# Patient Record
Sex: Female | Born: 1950 | Race: White | Hispanic: No | Marital: Married | Smoking: Never smoker
Health system: Southern US, Community
[De-identification: ages and names within clinical notes are randomized; demographics above are authoritative.]

## PROBLEM LIST (undated history)

## (undated) DIAGNOSIS — I2510 Atherosclerotic heart disease of native coronary artery without angina pectoris: Secondary | ICD-10-CM

## (undated) DIAGNOSIS — K635 Polyp of colon: Secondary | ICD-10-CM

## (undated) DIAGNOSIS — I10 Essential (primary) hypertension: Secondary | ICD-10-CM

## (undated) DIAGNOSIS — I4891 Unspecified atrial fibrillation: Secondary | ICD-10-CM

## (undated) DIAGNOSIS — Z8619 Personal history of other infectious and parasitic diseases: Secondary | ICD-10-CM

## (undated) DIAGNOSIS — C801 Malignant (primary) neoplasm, unspecified: Secondary | ICD-10-CM

## (undated) DIAGNOSIS — G4700 Insomnia, unspecified: Secondary | ICD-10-CM

## (undated) DIAGNOSIS — R011 Cardiac murmur, unspecified: Secondary | ICD-10-CM

## (undated) DIAGNOSIS — I340 Nonrheumatic mitral (valve) insufficiency: Secondary | ICD-10-CM

## (undated) DIAGNOSIS — K219 Gastro-esophageal reflux disease without esophagitis: Secondary | ICD-10-CM

## (undated) DIAGNOSIS — E785 Hyperlipidemia, unspecified: Secondary | ICD-10-CM

## (undated) DIAGNOSIS — I519 Heart disease, unspecified: Secondary | ICD-10-CM

## (undated) HISTORY — DX: Gastro-esophageal reflux disease without esophagitis: K21.9

## (undated) HISTORY — DX: Heart disease, unspecified: I51.9

## (undated) HISTORY — DX: Essential (primary) hypertension: I10

## (undated) HISTORY — DX: Unspecified atrial fibrillation: I48.91

## (undated) HISTORY — PX: HYSTEROSCOPY WITH D & C: SHX1775

## (undated) HISTORY — DX: Polyp of colon: K63.5

## (undated) HISTORY — DX: Insomnia, unspecified: G47.00

## (undated) HISTORY — PX: BREAST SURGERY: SHX581

## (undated) HISTORY — DX: Hyperlipidemia, unspecified: E78.5

## (undated) HISTORY — DX: Nonrheumatic mitral (valve) insufficiency: I34.0

## (undated) HISTORY — PX: APPENDECTOMY: SHX54

## (undated) HISTORY — DX: Personal history of other infectious and parasitic diseases: Z86.19

## (undated) HISTORY — PX: BREAST EXCISIONAL BIOPSY: SUR124

---

## 2003-12-11 LAB — HM COLONOSCOPY

## 2004-09-29 ENCOUNTER — Ambulatory Visit: Payer: Self-pay | Admitting: Unknown Physician Specialty

## 2005-10-26 ENCOUNTER — Ambulatory Visit: Payer: Self-pay | Admitting: Unknown Physician Specialty

## 2006-10-29 ENCOUNTER — Ambulatory Visit: Payer: Self-pay | Admitting: Unknown Physician Specialty

## 2008-01-03 ENCOUNTER — Ambulatory Visit: Payer: Self-pay | Admitting: Unknown Physician Specialty

## 2008-02-19 ENCOUNTER — Other Ambulatory Visit: Payer: Self-pay

## 2008-02-19 ENCOUNTER — Ambulatory Visit: Payer: Self-pay | Admitting: Unknown Physician Specialty

## 2008-03-03 ENCOUNTER — Ambulatory Visit: Payer: Self-pay | Admitting: Unknown Physician Specialty

## 2008-12-24 LAB — HM PAP SMEAR
HM PAP: NEGATIVE
HM Pap smear: NEGATIVE

## 2009-01-05 ENCOUNTER — Ambulatory Visit: Payer: Self-pay | Admitting: Unknown Physician Specialty

## 2009-01-07 ENCOUNTER — Ambulatory Visit: Payer: Self-pay | Admitting: Unknown Physician Specialty

## 2009-01-13 ENCOUNTER — Ambulatory Visit: Payer: Self-pay | Admitting: Unknown Physician Specialty

## 2009-03-09 ENCOUNTER — Ambulatory Visit: Payer: Self-pay | Admitting: Family Medicine

## 2010-03-11 ENCOUNTER — Ambulatory Visit: Payer: Self-pay | Admitting: Unknown Physician Specialty

## 2011-03-14 ENCOUNTER — Ambulatory Visit: Payer: Self-pay | Admitting: Unknown Physician Specialty

## 2011-03-16 ENCOUNTER — Ambulatory Visit: Payer: Self-pay | Admitting: Unknown Physician Specialty

## 2012-04-03 ENCOUNTER — Ambulatory Visit: Payer: Self-pay

## 2012-04-03 LAB — HM DEXA SCAN: HM DEXA SCAN: NORMAL

## 2012-09-13 ENCOUNTER — Ambulatory Visit: Payer: Self-pay | Admitting: Family Medicine

## 2012-09-20 ENCOUNTER — Ambulatory Visit: Payer: Self-pay | Admitting: Family Medicine

## 2012-10-04 ENCOUNTER — Emergency Department: Payer: Self-pay | Admitting: Emergency Medicine

## 2012-10-04 LAB — CBC
HCT: 37.6 % (ref 35.0–47.0)
MCV: 91 fL (ref 80–100)
RBC: 4.12 10*6/uL (ref 3.80–5.20)
RDW: 13.2 % (ref 11.5–14.5)
WBC: 5.1 10*3/uL (ref 3.6–11.0)

## 2012-10-04 LAB — TROPONIN I: Troponin-I: <0.02 ng/mL

## 2012-10-04 LAB — COMPREHENSIVE METABOLIC PANEL
Albumin: 3.7 g/dL (ref 3.4–5.0)
Alkaline Phosphatase: 43 U/L — ABNORMAL LOW (ref 50–136)
Anion Gap: 5 — ABNORMAL LOW (ref 7–16)
BUN: 12 mg/dL (ref 7–18)
Bilirubin,Total: 0.2 mg/dL (ref 0.2–1.0)
Calcium, Total: 9.1 mg/dL (ref 8.5–10.1)
EGFR (African American): >60
SGOT(AST): 19 U/L (ref 15–37)
SGPT (ALT): 32 U/L (ref 12–78)
Sodium: 141 mmol/L (ref 136–145)
Total Protein: 6.5 g/dL (ref 6.4–8.2)

## 2012-12-31 DIAGNOSIS — D3500 Benign neoplasm of unspecified adrenal gland: Secondary | ICD-10-CM | POA: Insufficient documentation

## 2013-02-19 DIAGNOSIS — C44320 Squamous cell carcinoma of skin of unspecified parts of face: Secondary | ICD-10-CM | POA: Insufficient documentation

## 2013-03-21 LAB — HM MAMMOGRAPHY

## 2014-01-12 LAB — HM MAMMOGRAPHY: HM Mammogram: NEGATIVE

## 2014-03-11 LAB — HM PAP SMEAR

## 2014-04-15 LAB — HM MAMMOGRAPHY

## 2014-05-01 HISTORY — PX: CORONARY ARTERY BYPASS GRAFT: SHX141

## 2014-05-29 LAB — BASIC METABOLIC PANEL
BUN: 13 mg/dL (ref 4–21)
Creatinine: 1 mg/dL (ref 0.5–1.1)
GLUCOSE: 95 mg/dL
POTASSIUM: 4.2 mmol/L (ref 3.4–5.3)
Sodium: 144 mmol/L (ref 137–147)

## 2014-05-29 LAB — LIPID PANEL
CHOLESTEROL: 110 mg/dL (ref 0–200)
HDL: 31 mg/dL — AB (ref 35–70)
LDL Cholesterol: 59 mg/dL
LDl/HDL Ratio: 1.9
Triglycerides: 99 mg/dL (ref 40–160)

## 2014-05-29 LAB — HEPATIC FUNCTION PANEL
ALK PHOS: 45 U/L (ref 25–125)
ALT: 35 U/L (ref 7–35)
AST: 22 U/L (ref 13–35)
Bilirubin, Total: 0.4 mg/dL

## 2014-06-01 DIAGNOSIS — I341 Nonrheumatic mitral (valve) prolapse: Secondary | ICD-10-CM | POA: Insufficient documentation

## 2014-06-01 DIAGNOSIS — I25810 Atherosclerosis of coronary artery bypass graft(s) without angina pectoris: Secondary | ICD-10-CM | POA: Insufficient documentation

## 2014-06-29 DIAGNOSIS — Z8679 Personal history of other diseases of the circulatory system: Secondary | ICD-10-CM | POA: Insufficient documentation

## 2014-07-13 ENCOUNTER — Encounter: Payer: Self-pay | Admitting: Internal Medicine

## 2014-08-11 ENCOUNTER — Encounter: Payer: Self-pay | Admitting: Internal Medicine

## 2014-11-26 LAB — CBC AND DIFFERENTIAL
HCT: 40 % (ref 36–46)
Hemoglobin: 13.4 g/dL (ref 12.0–16.0)
Neutrophils Absolute: 2 /uL
PLATELETS: 197 10*3/uL (ref 150–399)
WBC: 4.9 10^3/mL

## 2014-11-26 LAB — BASIC METABOLIC PANEL
BUN: 15 mg/dL (ref 4–21)
CREATININE: 0.9 mg/dL (ref 0.5–1.1)
GLUCOSE: 89 mg/dL
POTASSIUM: 3.7 mmol/L (ref 3.4–5.3)
SODIUM: 143 mmol/L (ref 137–147)

## 2014-11-26 LAB — HEPATIC FUNCTION PANEL
ALT: 24 U/L (ref 7–35)
AST: 20 U/L (ref 13–35)
Alkaline Phosphatase: 46 U/L (ref 25–125)
BILIRUBIN, TOTAL: 0.5 mg/dL

## 2014-11-26 LAB — LIPID PANEL
Cholesterol: 106 mg/dL (ref 0–200)
HDL: 35 mg/dL (ref 35–70)
LDL Cholesterol: 51 mg/dL
LDl/HDL Ratio: 1.5
Triglycerides: 100 mg/dL (ref 40–160)

## 2014-12-11 DIAGNOSIS — K2100 Gastro-esophageal reflux disease with esophagitis, without bleeding: Secondary | ICD-10-CM | POA: Insufficient documentation

## 2015-03-07 ENCOUNTER — Other Ambulatory Visit: Payer: Self-pay | Admitting: Family Medicine

## 2015-03-07 DIAGNOSIS — G4700 Insomnia, unspecified: Secondary | ICD-10-CM

## 2015-03-08 DIAGNOSIS — G4700 Insomnia, unspecified: Secondary | ICD-10-CM | POA: Insufficient documentation

## 2015-03-11 ENCOUNTER — Encounter: Payer: Self-pay | Admitting: Internal Medicine

## 2015-03-11 ENCOUNTER — Ambulatory Visit (INDEPENDENT_AMBULATORY_CARE_PROVIDER_SITE_OTHER): Payer: BLUE CROSS/BLUE SHIELD | Admitting: Internal Medicine

## 2015-03-11 ENCOUNTER — Encounter (INDEPENDENT_AMBULATORY_CARE_PROVIDER_SITE_OTHER): Payer: Self-pay

## 2015-03-11 VITALS — BP 130/70 | HR 64 | Temp 98.2°F | Ht 69.5 in | Wt 181.5 lb

## 2015-03-11 DIAGNOSIS — Z0000 Encounter for general adult medical examination without abnormal findings: Secondary | ICD-10-CM

## 2015-03-11 DIAGNOSIS — I10 Essential (primary) hypertension: Secondary | ICD-10-CM

## 2015-03-11 DIAGNOSIS — E7800 Pure hypercholesterolemia, unspecified: Secondary | ICD-10-CM

## 2015-03-11 DIAGNOSIS — G4700 Insomnia, unspecified: Secondary | ICD-10-CM

## 2015-03-11 DIAGNOSIS — I2510 Atherosclerotic heart disease of native coronary artery without angina pectoris: Secondary | ICD-10-CM | POA: Diagnosis not present

## 2015-03-11 MED ORDER — TRAZODONE HCL 50 MG PO TABS: ORAL_TABLET | ORAL | Status: DC

## 2015-03-11 NOTE — Progress Notes (Signed)
Pre visit review using our clinic review tool, if applicable. No additional management support is needed unless otherwise documented below in the visit note. 

## 2015-03-11 NOTE — Progress Notes (Signed)
[name], female   DOB: 10/18/1950, 64 y.o.   MRN: [id number]   Subjective:    [name], female    DOB: Jul 26, 1951, 64 y.o.   MRN: [id number]  HPI  Patient here to establish care.  She has a history of heart disease and is s/p triple bypass - 04/2014.  She went to cardiac rehab.  Since her bypass, she has adjusted her diet and is exercising.  Has lost weight.  Has lost 40 pounds.  Exercises at the [facility] 3 days per week.  Has decreased her salt intake.  Sees Dr Nehemiah Massed every six months.  No sob.  States her blood pressure is normally well controlled.  Has had colonoscopy, but has been over 10 years ago.  Bowels stable.  Has trouble sleeping.  This has been an issue since her bypass.  Has been on trazodone.  She has been trying to decrease the dose.  Needs something to help her sleep.     Past Medical History  Diagnosis Date  . History of chicken pox   . Hypertension   . Hyperlipidemia   . Heart disease     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)     Outpatient Encounter Prescriptions as of 03/11/2015  Medication Sig  . amLODipine (NORVASC) 10 MG tablet Take 10 mg by mouth daily.  Marland Kitchen aspirin EC 81 MG tablet Take 81 mg by mouth daily.  Marland Kitchen atorvastatin (LIPITOR) 20 MG tablet Take 20 mg by mouth daily.  . Cholecalciferol (D 1000) 1000 UNITS capsule Take 1,000 Units by mouth daily.  . clopidogrel (PLAVIX) 75 MG tablet Take 75 mg by mouth daily.  . metoprolol tartrate (LOPRESSOR) 25 MG tablet Take 25 mg by mouth 2 (two) times daily.  . Omega-3 Fatty Acids (FISH OIL) 1000 MG CAPS Take 1,200 mg by mouth daily.  . pantoprazole (PROTONIX) 40 MG tablet Take 40 mg by mouth daily.  Marland Kitchen telmisartan-hydrochlorothiazide (MICARDIS HCT) 80-25 MG per tablet Take by mouth daily.  . traZODone (DESYREL) 50 MG tablet Take 1-2 tablets q hs prn  . [DISCONTINUED] traZODone (DESYREL) 50 MG tablet TAKE 1 TABLET BY MOUTH EVERY NIGHT AT BEDTIME   No facility-administered encounter medications on  file as of 03/11/2015.    Review of Systems  Constitutional: Negative for appetite change and unexpected weight change.  HENT: Negative for congestion and sinus pressure.   Respiratory: Negative for cough, chest tightness and shortness of breath.   Cardiovascular: Negative for chest pain, palpitations and leg swelling.  Gastrointestinal: Negative for nausea, vomiting, abdominal pain and diarrhea.  Genitourinary: Negative for dysuria and difficulty urinating.  Musculoskeletal: Negative for back pain and joint swelling.  Skin: Negative for color change and rash.  Neurological: Negative for dizziness, light-headedness and headaches.  Hematological: Negative for adenopathy. Does not bruise/bleed easily.  Psychiatric/Behavioral: Negative for dysphoric mood and agitation.       Objective:     Blood pressure recheck:  148-150/84  Physical Exam  Constitutional: She appears well-developed and well-nourished. No distress.  HENT:  Nose: Nose normal.  Mouth/Throat: Oropharynx is clear and moist.  Neck: Neck supple. No thyromegaly present.  Cardiovascular: Normal rate and regular rhythm.   Pulmonary/Chest: Breath sounds normal. No respiratory distress. She has no wheezes.  Abdominal: Soft. Bowel sounds are normal. There is no tenderness.  Musculoskeletal: She exhibits no edema or tenderness.  Lymphadenopathy:    She has no cervical adenopathy.  Skin: No rash noted. No erythema.  Psychiatric: She has a normal mood and affect. Her behavior is normal.    BP 130/70 mmHg  Pulse 64  Temp(Src) 98.2 F (36.8 C) (Oral)  Ht 5' 9.5" (1.765 m)  Wt 181 lb 8 oz (82.328 kg)  BMI 26.43 kg/m2  SpO2 98% Wt Readings from Last 3 Encounters:  03/11/15 181 lb 8 oz (82.328 kg)     Lab Results  Component Value Date   WBC 4.9 11/26/2014   HGB 13.4 11/26/2014   HCT 40 11/26/2014   PLT 197 11/26/2014   GLUCOSE 91 10/04/2012   CHOL 106 11/26/2014   TRIG 100 11/26/2014   HDL 35 11/26/2014   LDLCALC  51 11/26/2014   ALT 24 11/26/2014   AST 20 11/26/2014   NA 143 11/26/2014   K 3.7 11/26/2014   CL 110* 10/04/2012   CREATININE 0.9 11/26/2014   BUN 15 11/26/2014   CO2 26 10/04/2012       Assessment & Plan:   Problem List Items Addressed This Visit    CAD (coronary artery disease)    Is s/p triple bypass.  Is exercising.  Has adjusted her diet.  Has lost weight.  No cardiac symptoms with increased activity or exertion.  Continue risk factor modification.  Continue f/u with cardiology.        Relevant Medications   aspirin EC 81 MG tablet   atorvastatin (LIPITOR) 20 MG tablet   telmisartan-hydrochlorothiazide (MICARDIS HCT) 80-25 MG per tablet   metoprolol tartrate (LOPRESSOR) 25 MG tablet   amLODipine (NORVASC) 10 MG tablet   Essential hypertension    Blood pressure as outlined.  Elevated today.  Have her spot check her pressure.  Get her back in soon to reassess.  Check metabolic panel.       Relevant Medications   aspirin EC 81 MG tablet   atorvastatin (LIPITOR) 20 MG tablet   telmisartan-hydrochlorothiazide (MICARDIS HCT) 80-25 MG per tablet   metoprolol tartrate (LOPRESSOR) 25 MG tablet   amLODipine (NORVASC) 10 MG tablet   Other Relevant Orders   TSH   Basic metabolic panel   Health care maintenance    Colonoscopy was more than 10 years ago.  Due now.  Will plan for referral to GI.   Make sure blood pressure under better control prior to scope.        Hypercholesterolemia    On lipitor.  Follow lipid panel and liver function tests.  Low cholesterol diet and exercise.        Relevant Medications   aspirin EC 81 MG tablet   atorvastatin (LIPITOR) 20 MG tablet   telmisartan-hydrochlorothiazide (MICARDIS HCT) 80-25 MG per tablet   metoprolol tartrate (LOPRESSOR) 25 MG tablet   amLODipine (NORVASC) 10 MG tablet   Other Relevant Orders   Lipid panel   Hepatic function panel   Insomnia - Primary    Discussed at length with her today.  On trazodone.  Will increase  the dose to 75mg  q hs.  Follow closely.  Increase to 100mg  if needed.        Relevant Medications   traZODone (DESYREL) 50 MG tablet     I spent 45 minutes with the patient and more than 50% of the time was spent in consultation regarding the above.     Einar Pheasant, MD

## 2015-03-14 ENCOUNTER — Encounter: Payer: Self-pay | Admitting: Internal Medicine

## 2015-03-14 DIAGNOSIS — I10 Essential (primary) hypertension: Secondary | ICD-10-CM | POA: Insufficient documentation

## 2015-03-14 DIAGNOSIS — Z0000 Encounter for general adult medical examination without abnormal findings: Secondary | ICD-10-CM | POA: Insufficient documentation

## 2015-03-14 DIAGNOSIS — I2510 Atherosclerotic heart disease of native coronary artery without angina pectoris: Secondary | ICD-10-CM | POA: Insufficient documentation

## 2015-03-14 DIAGNOSIS — E7800 Pure hypercholesterolemia, unspecified: Secondary | ICD-10-CM | POA: Insufficient documentation

## 2015-03-14 NOTE — Assessment & Plan Note (Signed)
Is s/p triple bypass.  Is exercising.  Has adjusted her diet.  Has lost weight.  No cardiac symptoms with increased activity or exertion.  Continue risk factor modification.  Continue f/u with cardiology.

## 2015-03-14 NOTE — Assessment & Plan Note (Signed)
On lipitor.  Follow lipid panel and liver function tests.  Low cholesterol diet and exercise   

## 2015-03-14 NOTE — Assessment & Plan Note (Signed)
Blood pressure as outlined.  Elevated today.  Have her spot check her pressure.  Get her back in soon to reassess.  Check metabolic panel.

## 2015-03-14 NOTE — Assessment & Plan Note (Signed)
Colonoscopy was more than 10 years ago.  Due now.  Will plan for referral to GI.   Make sure blood pressure under better control prior to scope.

## 2015-03-14 NOTE — Assessment & Plan Note (Signed)
Discussed at length with her today.  On trazodone.  Will increase the dose to 75mg  q hs.  Follow closely.  Increase to 100mg  if needed.

## 2015-03-23 ENCOUNTER — Encounter: Payer: Self-pay | Admitting: Internal Medicine

## 2015-03-24 ENCOUNTER — Other Ambulatory Visit: Payer: BLUE CROSS/BLUE SHIELD

## 2015-03-26 ENCOUNTER — Other Ambulatory Visit (INDEPENDENT_AMBULATORY_CARE_PROVIDER_SITE_OTHER): Payer: BLUE CROSS/BLUE SHIELD

## 2015-03-26 DIAGNOSIS — E7800 Pure hypercholesterolemia, unspecified: Secondary | ICD-10-CM

## 2015-03-26 DIAGNOSIS — I10 Essential (primary) hypertension: Secondary | ICD-10-CM

## 2015-03-26 LAB — HEPATIC FUNCTION PANEL
ALBUMIN: 4.1 g/dL (ref 3.5–5.2)
ALT: 19 U/L (ref 0–35)
AST: 19 U/L (ref 0–37)
Alkaline Phosphatase: 45 U/L (ref 39–117)
Bilirubin, Direct: 0.1 mg/dL (ref 0.0–0.3)
Total Bilirubin: 0.6 mg/dL (ref 0.2–1.2)
Total Protein: 6.7 g/dL (ref 6.0–8.3)

## 2015-03-26 LAB — LIPID PANEL
CHOLESTEROL: 110 mg/dL (ref 0–200)
HDL: 36 mg/dL — AB (ref >39.00)
LDL Cholesterol: 56 mg/dL (ref 0–99)
NonHDL: 74
Total CHOL/HDL Ratio: 3
Triglycerides: 90 mg/dL (ref 0.0–149.0)
VLDL: 18 mg/dL (ref 0.0–40.0)

## 2015-03-26 LAB — BASIC METABOLIC PANEL
BUN: 22 mg/dL (ref 6–23)
CO2: 30 mEq/L (ref 19–32)
Calcium: 10.1 mg/dL (ref 8.4–10.5)
Chloride: 104 mEq/L (ref 96–112)
Creatinine, Ser: 0.95 mg/dL (ref 0.40–1.20)
GFR: 62.98 mL/min (ref >60.00)
Glucose, Bld: 80 mg/dL (ref 70–99)
Potassium: 3.8 mEq/L (ref 3.5–5.1)
Sodium: 140 mEq/L (ref 135–145)

## 2015-03-26 LAB — TSH: TSH: 2.46 u[IU]/mL (ref 0.35–4.50)

## 2015-03-29 ENCOUNTER — Encounter: Payer: Self-pay | Admitting: *Deleted

## 2015-04-02 ENCOUNTER — Encounter: Payer: Self-pay | Admitting: Internal Medicine

## 2015-05-04 ENCOUNTER — Encounter: Payer: Self-pay | Admitting: Obstetrics and Gynecology

## 2015-05-05 ENCOUNTER — Ambulatory Visit (INDEPENDENT_AMBULATORY_CARE_PROVIDER_SITE_OTHER): Payer: BLUE CROSS/BLUE SHIELD | Admitting: Obstetrics and Gynecology

## 2015-05-05 ENCOUNTER — Encounter: Payer: Self-pay | Admitting: Obstetrics and Gynecology

## 2015-05-05 VITALS — BP 153/76 | HR 66 | Ht 70.0 in | Wt 180.8 lb

## 2015-05-05 DIAGNOSIS — E669 Obesity, unspecified: Secondary | ICD-10-CM | POA: Insufficient documentation

## 2015-05-05 DIAGNOSIS — Z1239 Encounter for other screening for malignant neoplasm of breast: Secondary | ICD-10-CM

## 2015-05-05 DIAGNOSIS — Z01419 Encounter for gynecological examination (general) (routine) without abnormal findings: Secondary | ICD-10-CM | POA: Diagnosis not present

## 2015-05-05 DIAGNOSIS — F4320 Adjustment disorder, unspecified: Secondary | ICD-10-CM | POA: Insufficient documentation

## 2015-05-05 DIAGNOSIS — N949 Unspecified condition associated with female genital organs and menstrual cycle: Secondary | ICD-10-CM

## 2015-05-05 DIAGNOSIS — Z1211 Encounter for screening for malignant neoplasm of colon: Secondary | ICD-10-CM

## 2015-05-05 DIAGNOSIS — Z8619 Personal history of other infectious and parasitic diseases: Secondary | ICD-10-CM | POA: Insufficient documentation

## 2015-05-05 DIAGNOSIS — E785 Hyperlipidemia, unspecified: Secondary | ICD-10-CM | POA: Insufficient documentation

## 2015-05-05 DIAGNOSIS — I059 Rheumatic mitral valve disease, unspecified: Secondary | ICD-10-CM | POA: Insufficient documentation

## 2015-05-05 DIAGNOSIS — N9489 Other specified conditions associated with female genital organs and menstrual cycle: Secondary | ICD-10-CM

## 2015-05-05 DIAGNOSIS — M1990 Unspecified osteoarthritis, unspecified site: Secondary | ICD-10-CM | POA: Insufficient documentation

## 2015-05-05 DIAGNOSIS — Z951 Presence of aortocoronary bypass graft: Secondary | ICD-10-CM | POA: Insufficient documentation

## 2015-05-05 DIAGNOSIS — F419 Anxiety disorder, unspecified: Secondary | ICD-10-CM | POA: Insufficient documentation

## 2015-05-05 NOTE — Patient Instructions (Addendum)
1.  No Pap. 2.  Mammogram ordered. 3.  Stool guaiac card testing. 4.  Pelvic ultrasound ordered to assess enlarged uterus versus left ovary enlargement 5.  Continue with calcium and vitamin D daily 6.  Continue with her regular exercise daily. 7.  Return in 1 year

## 2015-05-05 NOTE — Progress Notes (Signed)
[name], female   DOB: 1950/09/19, 64 y.o.   MRN: [id number] ANNUAL PREVENTATIVE CARE GYN  ENCOUNTER NOTE  Subjective:       Alexandra Mendoza is a 64 y.o. No obstetric history on file. female here for a routine annual gynecologic exam.  Current complaints: 1.  none    Gynecologic History No LMP recorded. Patient is postmenopausal. Contraception: post menopausal status Last Pap: 2013. Results were: normal Last mammogram: 2015. Results were: normal  Obstetric History Para 1001  Past Medical History  Diagnosis Date  . History of chicken pox   . Hypertension   . Hyperlipidemia   . 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 Insomnia   . Acid reflux     Past Surgical History  Procedure Laterality Date  . Appendectomy    . Triple bypass    . Hysteroscopy w/d&c    . Breast surgery      Biopsy  . Lipoma removed      removed from forehead    Current Outpatient Prescriptions on File Prior to Visit  Medication Sig Dispense Refill  . amLODipine (NORVASC) 10 MG tablet Take 10 mg by mouth daily.  4  . aspirin EC 81 MG tablet Take 81 mg by mouth daily.    Marland Kitchen atorvastatin (LIPITOR) 20 MG tablet Take 20 mg by mouth daily.    . Cholecalciferol (D 1000) 1000 UNITS capsule Take 1,000 Units by mouth daily.    . metoprolol tartrate (LOPRESSOR) 25 MG tablet Take 25 mg by mouth 2 (two) times daily.  5  . Omega-3 Fatty Acids (FISH OIL) 1000 MG CAPS Take 1,200 mg by mouth daily.    . pantoprazole (PROTONIX) 40 MG tablet Take 40 mg by mouth daily.  9  . telmisartan-hydrochlorothiazide (MICARDIS HCT) 80-25 MG per tablet Take by mouth daily.     No current facility-administered medications on file prior to visit.    No Known Allergies  Social History   Social History  . Marital Status: Married    Spouse Name: N/A  . Number of Children: N/A  . Years of Education: N/A   Occupational History  . Not on file.   Social History Main Topics  . Smoking status: Never  Smoker   . Smokeless tobacco: Never Used  . Alcohol Use: 0.0 oz/week    0 Standard drinks or equivalent per week     Comment: socially  . Drug Use: No  . Sexual Activity: Yes   Other Topics Concern  . Not on file   Social History Narrative    Family History  Problem Relation Age of Onset  . Breast cancer      maternal great aunt  . Colon cancer Neg Hx   . Diabetes Neg Hx   . Ovarian cancer Neg Hx   . Heart disease      multiple family members  . Heart disease Mother   . Heart disease Father     The following portions of the patient's history were reviewed and updated as appropriate: allergies, current medications, past family history, past medical history, past social history, past surgical history and problem list.  Review of Systems ROS Review of Systems - General ROS: negative for - chills, fatigue, fever, hot flashes, night sweats, weight gain or weight loss Psychological ROS: negative for - anxiety, decreased libido, depression, mood swings, physical abuse or sexual abuse Ophthalmic ROS: negative for - blurry vision, eye pain  or loss of vision ENT ROS: negative for - headaches, hearing change, visual changes or vocal changes Allergy and Immunology ROS: negative for - hives, itchy/watery eyes or seasonal allergies Hematological and Lymphatic ROS: negative for - bleeding problems, bruising, swollen lymph nodes or weight loss Endocrine ROS: negative for - galactorrhea, hair pattern changes, hot flashes, malaise/lethargy, mood swings, palpitations, polydipsia/polyuria, skin changes, temperature intolerance or unexpected weight changes Breast ROS: negative for - new or changing breast lumps or nipple discharge Respiratory ROS: negative for - cough or shortness of breath Cardiovascular ROS: negative for - chest pain, irregular heartbeat, palpitations or shortness of breath Gastrointestinal ROS: no abdominal pain, change in bowel habits, or black or bloody  stools Genito-Urinary ROS: no dysuria, trouble voiding, or hematuria Musculoskeletal ROS: negative for - joint pain or joint stiffness Neurological ROS: negative for - bowel and bladder control changes Dermatological ROS: negative for rash and skin lesion changes   Objective:   BP 153/76 mmHg  Pulse 66  Ht 5\' 10"  (1.778 m)  Wt 180 lb 12.8 oz (82.01 kg)  BMI 25.94 kg/m2 CONSTITUTIONAL: Well-developed, well-nourished female in no acute distress.  PSYCHIATRIC: Normal mood and affect. Normal behavior. Normal judgment and thought content. [place]: Alert and oriented to person, place, and time. Normal muscle tone coordination. No cranial nerve deficit noted. HENT:  Normocephalic, atraumatic, External right and left ear normal. Oropharynx is clear and moist EYES: Conjunctivae and EOM are normal. Pupils are equal, round, and reactive to light. No scleral icterus.  NECK: Normal range of motion, supple, no masses.  Normal thyroid.  SKIN: Skin is warm and dry. No rash noted. Not diaphoretic. No erythema. No pallor. CARDIOVASCULAR: Normal heart rate noted, regular rhythm, no murmur. RESPIRATORY: Clear to auscultation bilaterally. Effort and breath sounds normal, no problems with respiration noted. BREASTS: Symmetric in size. No masses, skin changes, nipple drainage, or lymphadenopathy. ABDOMEN: Soft, normal bowel sounds, no distention noted.  No tenderness, rebound or guarding.  BLADDER: Normal PELVIC:  External Genitalia: Normal  BUS: Normal  Vagina: Normal  Cervix: Normal  Uterus: irregular with slight deviation to Lt (?fibroid vs ovary); mobile, nontender  Adnexa: Normal  RV: External Exam NormaI, No Rectal Masses and Normal Sphincter tone  MUSCULOSKELETAL: Normal range of motion. No tenderness.  No cyanosis, clubbing, or edema.  2+ distal pulses. LYMPHATIC: No Axillary, Supraclavicular, or Inguinal Adenopathy.    Assessment:   Annual gynecologic examination 64 y.o. Contraception:  status post hysterectomy Normal BMI LLQ mass (fibroid vs ovary) ASVD/CAD, S/P Triple bypass, asymptomatic  Plan:  Pap: Pap Co Test Mammogram: Ordered Stool Guaiac Testing:  Ordered Labs: thru pcp Routine preventative health maintenance measures emphasized: Exercise/Diet/Weight control, Tobacco Warnings and Alcohol/Substance use risks U/S - pelvis Return to Clinic - [place], CMA  Brayton Mars, MD

## 2015-05-08 LAB — PAP IG AND HPV HIGH-RISK
HPV, high-risk: NEGATIVE
PAP Smear Comment: 0

## 2015-05-11 ENCOUNTER — Ambulatory Visit: Payer: BLUE CROSS/BLUE SHIELD

## 2015-05-11 DIAGNOSIS — N9489 Other specified conditions associated with female genital organs and menstrual cycle: Secondary | ICD-10-CM

## 2015-05-11 DIAGNOSIS — N949 Unspecified condition associated with female genital organs and menstrual cycle: Secondary | ICD-10-CM | POA: Diagnosis not present

## 2015-05-12 ENCOUNTER — Encounter: Payer: Self-pay | Admitting: Internal Medicine

## 2015-05-12 ENCOUNTER — Ambulatory Visit
Admission: RE | Admit: 2015-05-12 | Discharge: 2015-05-12 | Disposition: A | Discharge status: Home / Self Care | Payer: BLUE CROSS/BLUE SHIELD | Source: Ambulatory Visit | Attending: Obstetrics and Gynecology | Admitting: Obstetrics and Gynecology

## 2015-05-12 ENCOUNTER — Ambulatory Visit (INDEPENDENT_AMBULATORY_CARE_PROVIDER_SITE_OTHER): Payer: BLUE CROSS/BLUE SHIELD | Admitting: Internal Medicine

## 2015-05-12 VITALS — BP 130/60 | HR 58 | Temp 98.2°F | Ht 70.0 in | Wt 178.2 lb

## 2015-05-12 DIAGNOSIS — E7800 Pure hypercholesterolemia, unspecified: Secondary | ICD-10-CM

## 2015-05-12 DIAGNOSIS — K2100 Gastro-esophageal reflux disease with esophagitis, without bleeding: Secondary | ICD-10-CM

## 2015-05-12 DIAGNOSIS — I2510 Atherosclerotic heart disease of native coronary artery without angina pectoris: Secondary | ICD-10-CM | POA: Diagnosis not present

## 2015-05-12 DIAGNOSIS — Z1239 Encounter for other screening for malignant neoplasm of breast: Secondary | ICD-10-CM

## 2015-05-12 DIAGNOSIS — Z1231 Encounter for screening mammogram for malignant neoplasm of breast: Secondary | ICD-10-CM | POA: Insufficient documentation

## 2015-05-12 DIAGNOSIS — I10 Essential (primary) hypertension: Secondary | ICD-10-CM

## 2015-05-12 DIAGNOSIS — R87629 Unspecified abnormal cytological findings in specimens from vagina: Secondary | ICD-10-CM

## 2015-05-12 DIAGNOSIS — G4700 Insomnia, unspecified: Secondary | ICD-10-CM

## 2015-05-12 DIAGNOSIS — R896 Abnormal cytological findings in specimens from other organs, systems and tissues: Secondary | ICD-10-CM

## 2015-05-12 NOTE — Progress Notes (Signed)
[name], female   DOB: 13-Mar-1951, 64 y.o.   MRN: [id number]   Subjective:    [name], female    DOB: [birth year], 64 y.o.   MRN: [id number]  HPI  Patient here for a scheduled follow up.  Saw Dr Enzo Bi.  Had pelvic ultrasound.  Fibroid.  PAP with ASCUS - negative HPV.  Recommended f/u pap in one year.  He gave her hemoccult cards.  Trazodone is working for her.  Helping her sleep.  Sees Dr Nehemiah Massed for her CAD.  Stable.  Doing well.  No chest pain or tightness.  No sob.  Eating and drinking well.  No nausea or vomiting.  Bowels stable.  Sister was diagnosed with breast cancer last week. Discussed this and genetic testing.  Mother - no history of breast cancer.     Past Medical History  Diagnosis Date  . History of chicken pox   . Hypertension   . Hyperlipidemia   . 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 Insomnia   . Acid reflux    Past Surgical History  Procedure Laterality Date  . Appendectomy    . Triple bypass    . Hysteroscopy w/d&c    . Breast surgery      Biopsy  . Lipoma removed      removed from forehead  . Breast excisional biopsy Right     negative over 5 years ago   Family History  Problem Relation Age of Onset  . Breast cancer      maternal great aunt  . Heart disease      multiple family members  . Colon cancer Neg Hx   . Diabetes Neg Hx   . Ovarian cancer Neg Hx   . Heart disease Mother   . Heart disease Father   . Breast cancer Sister 71   Social History   Social History  . Marital Status: Married    Spouse Name: N/A  . Number of Children: N/A  . Years of Education: N/A   Social History Main Topics  . Smoking status: Never Smoker   . Smokeless tobacco: Never Used  . Alcohol Use: 0.0 oz/week    0 Standard drinks or equivalent per week     Comment: socially  . Drug Use: No  . Sexual Activity: Yes   Other Topics Concern  . None   Social History Narrative    Outpatient Encounter  Prescriptions as of 05/12/2015  Medication Sig  . amLODipine (NORVASC) 10 MG tablet Take 10 mg by mouth daily.  Marland Kitchen aspirin EC 81 MG tablet Take 81 mg by mouth daily.  Marland Kitchen atorvastatin (LIPITOR) 20 MG tablet Take 20 mg by mouth daily.  Marland Kitchen BIOTIN PO Take by mouth daily.  . Calcium-Magnesium-Vitamin D (CALCIUM 1200+D3 PO) Take by mouth.  . metoprolol tartrate (LOPRESSOR) 25 MG tablet Take 25 mg by mouth 2 (two) times daily.  . Omega-3 Fatty Acids (FISH OIL) 1000 MG CAPS Take 1,200 mg by mouth daily.  . pantoprazole (PROTONIX) 40 MG tablet Take 40 mg by mouth daily.  Marland Kitchen telmisartan-hydrochlorothiazide (MICARDIS HCT) 80-25 MG per tablet Take by mouth daily.  . traZODone (DESYREL) 50 MG tablet Take 1.5 tablets by mouth.  . [DISCONTINUED] Cholecalciferol (D 1000) 1000 UNITS capsule Take 1,000 Units by mouth daily.   No facility-administered encounter medications on file as of 05/12/2015.    Review of Systems  Constitutional: Negative for appetite change  and unexpected weight change.  HENT: Negative for congestion and sinus pressure.   Respiratory: Negative for cough, chest tightness and shortness of breath.   Cardiovascular: Negative for chest pain, palpitations and leg swelling.  Gastrointestinal: Negative for nausea, vomiting, abdominal pain and diarrhea.  Genitourinary: Negative for dysuria and difficulty urinating.  Musculoskeletal: Negative for back pain and joint swelling.  Skin: Negative for color change and rash.  Neurological: Negative for dizziness, light-headedness and headaches.  Hematological: Negative for adenopathy. Does not bruise/bleed easily.  Psychiatric/Behavioral: Negative for dysphoric mood and agitation.       Objective:    Physical Exam  Constitutional: She appears well-developed and well-nourished. No distress.  HENT:  Nose: Nose normal.  Mouth/Throat: Oropharynx is clear and moist.  Eyes: Conjunctivae are normal. Right eye exhibits no discharge. Left eye exhibits no  discharge.  Neck: Neck supple. No thyromegaly present.  Cardiovascular: Normal rate and regular rhythm.   Pulmonary/Chest: Breath sounds normal. No respiratory distress. She has no wheezes.  Abdominal: Soft. Bowel sounds are normal. There is no tenderness.  Musculoskeletal: She exhibits no edema or tenderness.  Lymphadenopathy:    She has no cervical adenopathy.  Skin: No rash noted. No erythema.  Psychiatric: She has a normal mood and affect. Her behavior is normal.    BP 130/60 mmHg  Pulse 58  Temp(Src) 98.2 F (36.8 C) (Oral)  Ht 5\' 10"  (1.778 m)  Wt 178 lb 4 oz (80.854 kg)  BMI 25.58 kg/m2  SpO2 98% Wt Readings from Last 3 Encounters:  05/12/15 178 lb 4 oz (80.854 kg)  05/05/15 180 lb 12.8 oz (82.01 kg)  03/11/15 181 lb 8 oz (82.328 kg)     Lab Results  Component Value Date   WBC 4.9 11/26/2014   HGB 13.4 11/26/2014   HCT 40 11/26/2014   PLT 197 11/26/2014   GLUCOSE 80 03/26/2015   CHOL 110 03/26/2015   TRIG 90.0 03/26/2015   HDL 36.00* 03/26/2015   LDLCALC 56 03/26/2015   ALT 19 03/26/2015   AST 19 03/26/2015   NA 140 03/26/2015   K 3.8 03/26/2015   CL 104 03/26/2015   CREATININE 0.95 03/26/2015   BUN 22 03/26/2015   CO2 30 03/26/2015   TSH 2.46 03/26/2015       Assessment & Plan:   Problem List Items Addressed This Visit    Abnormal vaginal Pap smear    ASCUS.  Negative HPV.  Seeing Dr Enzo Bi.  Planning for f/u pap in one year.        CAD (coronary artery disease) - Primary    Is s/p triple bypass.  Is exercising.  Has lost weight.  No cardiac symptoms with increased activity or exertion.  Continue risk factor modification.       Esophagitis, reflux    On protonix.        Essential hypertension    Blood pressure under good control.  Continue same medication regimen.  Follow pressures.  Follow metabolic panel.        Relevant Orders   Basic metabolic panel   Hypercholesterolemia    Low cholesterol diet and exercise.  Follow lipid panel  and liver function tests.  On lipitor.        Relevant Orders   Lipid panel   Hepatic function panel   Insomnia    Sleeping better with trazodone.  Follow.           Einar Pheasant, MD

## 2015-05-12 NOTE — Progress Notes (Signed)
Pre-visit discussion using our clinic review tool. No additional management support is needed unless otherwise documented below in the visit note.  

## 2015-05-16 LAB — FECAL OCCULT BLOOD, IMMUNOCHEMICAL: Fecal Occult Bld: NEGATIVE

## 2015-05-18 ENCOUNTER — Encounter: Payer: Self-pay | Admitting: Internal Medicine

## 2015-05-18 DIAGNOSIS — R87629 Unspecified abnormal cytological findings in specimens from vagina: Secondary | ICD-10-CM | POA: Insufficient documentation

## 2015-05-18 NOTE — Assessment & Plan Note (Signed)
Low cholesterol diet and exercise.  Follow lipid panel and liver function tests.  On lipitor.   

## 2015-05-18 NOTE — Assessment & Plan Note (Signed)
ASCUS.  Negative HPV.  Seeing Dr Enzo Bi.  Planning for f/u pap in one year.

## 2015-05-18 NOTE — Assessment & Plan Note (Signed)
On protonix

## 2015-05-18 NOTE — Assessment & Plan Note (Signed)
Blood pressure under good control.  Continue same medication regimen.  Follow pressures.  Follow metabolic panel.   

## 2015-05-18 NOTE — Assessment & Plan Note (Signed)
Is s/p triple bypass.  Is exercising.  Has lost weight.  No cardiac symptoms with increased activity or exertion.  Continue risk factor modification.

## 2015-05-18 NOTE — Assessment & Plan Note (Signed)
Sleeping better with trazodone.  Follow.

## 2015-05-24 ENCOUNTER — Other Ambulatory Visit: Payer: Self-pay | Admitting: Internal Medicine

## 2015-05-27 ENCOUNTER — Ambulatory Visit: Payer: Self-pay | Admitting: Family Medicine

## 2015-06-22 ENCOUNTER — Ambulatory Visit (INDEPENDENT_AMBULATORY_CARE_PROVIDER_SITE_OTHER): Payer: BLUE CROSS/BLUE SHIELD

## 2015-06-22 DIAGNOSIS — Z23 Encounter for immunization: Secondary | ICD-10-CM | POA: Diagnosis not present

## 2015-08-16 ENCOUNTER — Other Ambulatory Visit (INDEPENDENT_AMBULATORY_CARE_PROVIDER_SITE_OTHER): Payer: BLUE CROSS/BLUE SHIELD

## 2015-08-16 ENCOUNTER — Other Ambulatory Visit: Payer: Self-pay | Admitting: Internal Medicine

## 2015-08-16 ENCOUNTER — Ambulatory Visit (INDEPENDENT_AMBULATORY_CARE_PROVIDER_SITE_OTHER): Payer: BLUE CROSS/BLUE SHIELD | Admitting: Family Medicine

## 2015-08-16 ENCOUNTER — Telehealth: Payer: Self-pay | Admitting: *Deleted

## 2015-08-16 ENCOUNTER — Encounter: Payer: Self-pay | Admitting: Family Medicine

## 2015-08-16 VITALS — BP 142/64 | HR 78 | Temp 98.4°F | Ht 70.0 in | Wt 175.0 lb

## 2015-08-16 DIAGNOSIS — W540XXA Bitten by dog, initial encounter: Secondary | ICD-10-CM | POA: Diagnosis not present

## 2015-08-16 DIAGNOSIS — Z0000 Encounter for general adult medical examination without abnormal findings: Secondary | ICD-10-CM

## 2015-08-16 DIAGNOSIS — E7800 Pure hypercholesterolemia, unspecified: Secondary | ICD-10-CM

## 2015-08-16 DIAGNOSIS — T148 Other injury of unspecified body region: Secondary | ICD-10-CM

## 2015-08-16 DIAGNOSIS — I10 Essential (primary) hypertension: Secondary | ICD-10-CM | POA: Diagnosis not present

## 2015-08-16 DIAGNOSIS — Z23 Encounter for immunization: Secondary | ICD-10-CM

## 2015-08-16 LAB — LIPID PANEL
CHOLESTEROL: 112 mg/dL (ref 0–200)
HDL: 35.3 mg/dL — ABNORMAL LOW (ref >39.00)
LDL CALC: 58 mg/dL (ref 0–99)
NONHDL: 77.07
Total CHOL/HDL Ratio: 3
Triglycerides: 94 mg/dL (ref 0.0–149.0)
VLDL: 18.8 mg/dL (ref 0.0–40.0)

## 2015-08-16 LAB — BASIC METABOLIC PANEL
BUN: 18 mg/dL (ref 6–23)
CO2: 31 mEq/L (ref 19–32)
Calcium: 10.3 mg/dL (ref 8.4–10.5)
Chloride: 104 mEq/L (ref 96–112)
Creatinine, Ser: 0.86 mg/dL (ref 0.40–1.20)
GFR: 70.56 mL/min (ref >60.00)
GLUCOSE: 84 mg/dL (ref 70–99)
POTASSIUM: 3.6 meq/L (ref 3.5–5.1)
SODIUM: 141 meq/L (ref 135–145)

## 2015-08-16 LAB — HEPATIC FUNCTION PANEL
ALT: 18 U/L (ref 0–35)
AST: 17 U/L (ref 0–37)
Albumin: 4.2 g/dL (ref 3.5–5.2)
Alkaline Phosphatase: 48 U/L (ref 39–117)
BILIRUBIN TOTAL: 0.6 mg/dL (ref 0.2–1.2)
Bilirubin, Direct: 0.1 mg/dL (ref 0.0–0.3)
Total Protein: 6.9 g/dL (ref 6.0–8.3)

## 2015-08-16 MED ORDER — AMOXICILLIN-POT CLAVULANATE 875-125 MG PO TABS: 1.0000 | ORAL_TABLET | Freq: Two times a day (BID) | ORAL | Status: DC

## 2015-08-16 NOTE — Telephone Encounter (Signed)
Pt said she wanted to wait til she talked to you tomorrow about it

## 2015-08-16 NOTE — Assessment & Plan Note (Addendum)
New problem. Wound cleaned today. Animal control was contacted by my staff as well as by the patient. Given prophylactic Augmentin. Advised normal bathing with soap and water. Patient to follow-up with annual control regarding status of dog's rabies status.

## 2015-08-16 NOTE — Progress Notes (Signed)
Pre visit review using our clinic review tool, if applicable. No additional management support is needed unless otherwise documented below in the visit note. 

## 2015-08-16 NOTE — Progress Notes (Signed)
Subjective:  [name], female    DOB: [birth year]  Age: 64 y.o. MRN: [id number]  CC: Dog bite  HPI:  64 year old female presents today for an acute visit after suffering a dog bite earlier today.  Dog bite  Patient reports that she was going to the mailbox and was approached and bitten by the neighbor's dog.  She was bitten on the left posterior thigh.  She reports associated pain. Pain is mild in severity. She had some bleeding. No current drainage or discharge.  No exacerbating or relieving factors.   No other associated symptoms. No fevers or chills.  Patient came in immediately for evaluation and did not clean the wound.  She has contacted animal control and they will be following up with her about filling out paperwork and determining the dogs rabies status.  She is unsure of her last tetanus immunization.  Social Hx   Social History   Social History  . Marital Status: Married    Spouse Name: N/A  . Number of Children: N/A  . Years of Education: N/A   Social History Main Topics  . Smoking status: Never Smoker   . Smokeless tobacco: Never Used  . Alcohol Use: 0.0 oz/week    0 Standard drinks or equivalent per week     Comment: socially  . Drug Use: No  . Sexual Activity: Yes   Other Topics Concern  . None   Social History Narrative   Review of Systems  Constitutional: Negative.   Skin:       Wound - from dog bite.   Objective:  BP 142/64 mmHg  Pulse 78  Temp(Src) 98.4 F (36.9 C) (Oral)  Ht 5\' 10"  (1.778 m)  Wt 175 lb (79.379 kg)  BMI 25.11 kg/m2  SpO2 97%  BP/Weight 08/16/2015 05/12/2015 [id number]  Systolic BP [id number]  Diastolic BP 64 60 76  Wt. (Lbs) 175 178.25 180.8  BMI 25.11 25.58 25.94   Physical Exam  Constitutional: She is oriented to person, place, and time. She appears well-developed. No distress.  HENT:  Head: Normocephalic and atraumatic.  Eyes: No scleral icterus.  Cardiovascular: Normal rate and  regular rhythm.   Murmur heard.  Systolic murmur is present with a grade of 2/6  Pulmonary/Chest: Effort normal and breath sounds normal.  Neurological: She is alert and oriented to person, place, and time.  Skin:  4 small puncture wounds noted - posterior left thigh.  Ecchymosis noted. No erythema. Wounds cleaned with sterile saline today.   Psychiatric: She has a normal mood and affect.  Vitals reviewed.   Lab Results  Component Value Date   WBC 4.9 11/26/2014   HGB 13.4 11/26/2014   HCT 40 11/26/2014   PLT 197 11/26/2014   GLUCOSE 84 08/16/2015   CHOL 112 08/16/2015   TRIG 94.0 08/16/2015   HDL 35.30* 08/16/2015   LDLCALC 58 08/16/2015   ALT 18 08/16/2015   AST 17 08/16/2015   NA 141 08/16/2015   K 3.6 08/16/2015   CL 104 08/16/2015   CREATININE 0.86 08/16/2015   BUN 18 08/16/2015   CO2 31 08/16/2015   TSH 2.46 03/26/2015    Assessment & Plan:   Problem List Items Addressed This Visit    Dog bite - Primary    New problem. Wound cleaned today. Animal control was contacted by my staff as well as by the patient. Given prophylactic Augmentin. Advised normal bathing with soap and water. Patient to follow-up  with annual control regarding status of dog's rabies status.      Relevant Orders   Tdap vaccine greater than or equal to 7yo IM (Completed)      Meds ordered this encounter  Medications  . amoxicillin-clavulanate (AUGMENTIN) 875-125 MG tablet    Sig: Take 1 tablet by mouth 2 (two) times daily.    Dispense:  14 tablet    Refill:  0    Follow-up: Return if symptoms worsen or fail to improve.  [place]

## 2015-08-16 NOTE — Patient Instructions (Signed)
We are calling animal control. Be sure to follow up with them.  Take the antibiotic (for prophylaxis) as prescribed.  Follow up closely with Dr. Nicki Reaper  Please let us know if you worsen   Take care  Dr. Lacinda Axon

## 2015-08-16 NOTE — Telephone Encounter (Signed)
Pt would like to add vit d 

## 2015-08-16 NOTE — Telephone Encounter (Signed)
I have added a vitamin D level, but you may want to inform her before it is run that I am not sure insurance will cover.  Thanks.

## 2015-08-17 ENCOUNTER — Encounter: Payer: Self-pay | Admitting: Internal Medicine

## 2015-08-17 ENCOUNTER — Other Ambulatory Visit (INDEPENDENT_AMBULATORY_CARE_PROVIDER_SITE_OTHER): Payer: BLUE CROSS/BLUE SHIELD

## 2015-08-17 ENCOUNTER — Other Ambulatory Visit: Payer: Self-pay | Admitting: *Deleted

## 2015-08-17 ENCOUNTER — Ambulatory Visit (INDEPENDENT_AMBULATORY_CARE_PROVIDER_SITE_OTHER): Payer: BLUE CROSS/BLUE SHIELD | Admitting: Internal Medicine

## 2015-08-17 VITALS — BP 120/80 | HR 72 | Temp 98.3°F | Resp 18 | Ht 70.0 in | Wt 176.0 lb

## 2015-08-17 DIAGNOSIS — G4700 Insomnia, unspecified: Secondary | ICD-10-CM

## 2015-08-17 DIAGNOSIS — R109 Unspecified abdominal pain: Secondary | ICD-10-CM

## 2015-08-17 DIAGNOSIS — E559 Vitamin D deficiency, unspecified: Secondary | ICD-10-CM

## 2015-08-17 DIAGNOSIS — E7800 Pure hypercholesterolemia, unspecified: Secondary | ICD-10-CM

## 2015-08-17 DIAGNOSIS — R896 Abnormal cytological findings in specimens from other organs, systems and tissues: Secondary | ICD-10-CM

## 2015-08-17 DIAGNOSIS — R87629 Unspecified abnormal cytological findings in specimens from vagina: Secondary | ICD-10-CM

## 2015-08-17 DIAGNOSIS — I10 Essential (primary) hypertension: Secondary | ICD-10-CM

## 2015-08-17 DIAGNOSIS — T148 Other injury of unspecified body region: Secondary | ICD-10-CM

## 2015-08-17 DIAGNOSIS — I2510 Atherosclerotic heart disease of native coronary artery without angina pectoris: Secondary | ICD-10-CM

## 2015-08-17 DIAGNOSIS — W540XXA Bitten by dog, initial encounter: Secondary | ICD-10-CM

## 2015-08-17 LAB — VITAMIN D 25 HYDROXY (VIT D DEFICIENCY, FRACTURES): VITD: 42.24 ng/mL (ref 30.00–100.00)

## 2015-08-17 NOTE — Progress Notes (Signed)
[name], female   DOB: 1950-11-12, 64 y.o.   MRN: [id number]   Subjective:    [name], female    DOB: 01-03-1951, 64 y.o.   MRN: [id number]  HPI  Patient with past history of hypercholesterolemia, GERD and hypertension.  She comes in today to follow up on these issues.  She stays active.  Saw Dr Nehemiah Massed.  Had noticed some chest pain.  See his note for details.  Had echo and stress echo.  States heart checked out ok.  No chest pain or tightness now.  No sob.  No acid reflux reported.  Does report some increased intermittent abdominal discomfort.  Some gas.  Present over the last 3-4 weeks.  Some constipation.  Question if the discomfort is related to the constipation.  States may be improved after bm.  Taking a stool softener daily.  No pain today.  No urinary issues.  She did get bit by a dog yesterday.  Dog had rabies shots.  Saw Dr Lacinda Axon yesterday.  On augmentin now.  Tolerating.     Past Medical History  Diagnosis Date  . History of chicken pox   . Hypertension   . Hyperlipidemia   . 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 Insomnia   . Acid reflux    Past Surgical History  Procedure Laterality Date  . Appendectomy    . Triple bypass    . Hysteroscopy w/d&c    . Breast surgery      Biopsy  . Lipoma removed      removed from forehead  . Breast excisional biopsy Right     negative over 5 years ago   Family History  Problem Relation Age of Onset  . Breast cancer      maternal great aunt  . Heart disease      multiple family members  . Colon cancer Neg Hx   . Diabetes Neg Hx   . Ovarian cancer Neg Hx   . Heart disease Mother   . Heart disease Father   . Breast cancer Sister 36   Social History   Social History  . Marital Status: Married    Spouse Name: N/A  . Number of Children: N/A  . Years of Education: N/A   Social History Main Topics  . Smoking status: Never Smoker   . Smokeless tobacco: Never Used  . Alcohol Use:  0.0 oz/week    0 Standard drinks or equivalent per week     Comment: socially  . Drug Use: No  . Sexual Activity: Yes   Other Topics Concern  . None   Social History Narrative    Outpatient Encounter Prescriptions as of 08/17/2015  Medication Sig  . amLODipine (NORVASC) 10 MG tablet Take 10 mg by mouth daily.  Marland Kitchen amoxicillin-clavulanate (AUGMENTIN) 875-125 MG tablet Take 1 tablet by mouth 2 (two) times daily.  Marland Kitchen aspirin EC 81 MG tablet Take 81 mg by mouth daily.  Marland Kitchen atorvastatin (LIPITOR) 20 MG tablet Take 20 mg by mouth daily.  . metoprolol tartrate (LOPRESSOR) 25 MG tablet Take 25 mg by mouth 2 (two) times daily.  . Omega-3 Fatty Acids (FISH OIL) 1000 MG CAPS Take 1,200 mg by mouth daily.  . pantoprazole (PROTONIX) 40 MG tablet Take 40 mg by mouth daily.  Marland Kitchen telmisartan-hydrochlorothiazide (MICARDIS HCT) 80-25 MG per tablet Take by mouth daily.  . traZODone (DESYREL) 50 MG tablet TAKE 1 TO 2  TABLETS BY MOUTH AT BEDTIME AS NEEDED  . VITAMIN D, ERGOCALCIFEROL, PO Take by mouth.  . [DISCONTINUED] BIOTIN PO Take by mouth daily.  . [DISCONTINUED] Calcium-Magnesium-Vitamin D (CALCIUM 1200+D3 PO) Take by mouth.  . [DISCONTINUED] traZODone (DESYREL) 50 MG tablet Take 1.5 tablets by mouth.   No facility-administered encounter medications on file as of 08/17/2015.    Review of Systems  Constitutional: Negative for appetite change and unexpected weight change.  HENT: Negative for sinus pressure.   Eyes: Negative for pain and discharge.  Respiratory: Negative for cough, chest tightness and shortness of breath.   Cardiovascular: Negative for chest pain, palpitations and leg swelling.  Gastrointestinal: Positive for constipation. Negative for nausea, vomiting and diarrhea.       Some intermittent abdominal discomfort.    Genitourinary: Negative for dysuria and difficulty urinating.  Musculoskeletal: Negative for back pain and joint swelling.  Skin: Negative for color change and rash.        Four puncture wounds left posterior thigh. No surrounding erythema.    Neurological: Negative for dizziness, light-headedness and headaches.  Psychiatric/Behavioral: Negative for dysphoric mood and agitation.       Objective:    Physical Exam  Constitutional: She appears well-developed and well-nourished. No distress.  HENT:  Nose: Nose normal.  Mouth/Throat: Oropharynx is clear and moist.  Eyes: Conjunctivae are normal. Right eye exhibits no discharge. Left eye exhibits no discharge.  Neck: Neck supple. No thyromegaly present.  Cardiovascular: Normal rate and regular rhythm.   Pulmonary/Chest: Breath sounds normal. No respiratory distress. She has no wheezes.  Abdominal: Soft. Bowel sounds are normal. There is no tenderness.  Musculoskeletal: She exhibits no edema or tenderness.  Four puncture wounds left posterior thigh.  No surrounding erythema.    Lymphadenopathy:    She has no cervical adenopathy.  Skin: No rash noted. No erythema.  Psychiatric: She has a normal mood and affect. Her behavior is normal.    BP 120/80 mmHg  Pulse 72  Temp(Src) 98.3 F (36.8 C) (Oral)  Resp 18  Ht 5\' 10"  (1.778 m)  Wt 176 lb (79.833 kg)  BMI 25.25 kg/m2  SpO2 98% Wt Readings from Last 3 Encounters:  08/17/15 176 lb (79.833 kg)  08/16/15 175 lb (79.379 kg)  05/12/15 178 lb 4 oz (80.854 kg)     Lab Results  Component Value Date   WBC 4.9 11/26/2014   HGB 13.4 11/26/2014   HCT 40 11/26/2014   PLT 197 11/26/2014   GLUCOSE 84 08/16/2015   CHOL 112 08/16/2015   TRIG 94.0 08/16/2015   HDL 35.30* 08/16/2015   LDLCALC 58 08/16/2015   ALT 18 08/16/2015   AST 17 08/16/2015   NA 141 08/16/2015   K 3.6 08/16/2015   CL 104 08/16/2015   CREATININE 0.86 08/16/2015   BUN 18 08/16/2015   CO2 31 08/16/2015   TSH 2.46 03/26/2015    Mm Digital Screening Bilateral  05/12/2015  CLINICAL DATA:  Screening. EXAM: DIGITAL SCREENING BILATERAL MAMMOGRAM WITH CAD COMPARISON:  Previous exam(s). ACR  Breast Density Category c: The breast tissue is heterogeneously dense, which may obscure small masses. FINDINGS: There are no findings suspicious for malignancy. Images were processed with CAD. IMPRESSION: No mammographic evidence of malignancy. A result letter of this screening mammogram will be mailed directly to the patient. RECOMMENDATION: Screening mammogram in one year. (Code:SM-B-01Y) BI-RADS CATEGORY  1: Negative. Electronically Signed   By: Lillia Mountain M.D.   On: 05/12/2015 16:07  Assessment & Plan:   Problem List Items Addressed This Visit    Abdominal discomfort    Some constipation.  Taking stool softener.  Is some better.  Start align daily.  Follow for triggers.  If persistent symptoms or problems, will require further testing.        Abnormal vaginal Pap smear    Seeing Dr Enzo Bi.        CAD (coronary artery disease)    Is s/p triple bypass.  Just saw cardiology.  Cardiac evaluation ok.  Continue risk factor modification.        Dog bite    On augmentin.  Animal control already contacted.  S/p tetanus yesterday.  Dog up to date with vaccinations.        Essential hypertension    Blood pressure under good control.  Continue same medication regimen.  Follow pressures.  Follow metabolic panel.        Hypercholesterolemia    On lipitor.  Low cholesterol diet and exercise.  Follow lipid panel and liver function tests.        Insomnia    Doing well on trazodone.  Follow.         Other Visit Diagnoses    Vitamin D deficiency    -  Primary    Relevant Orders    VITAMIN D 25 Hydroxy (Vit-D Deficiency, Fractures) (Completed)        Einar Pheasant, MD

## 2015-08-17 NOTE — Progress Notes (Signed)
Pre-visit discussion using our clinic review tool. No additional management support is needed unless otherwise documented below in the visit note.  

## 2015-08-17 NOTE — Patient Instructions (Signed)
Align - one per day 

## 2015-08-22 ENCOUNTER — Encounter: Payer: Self-pay | Admitting: Internal Medicine

## 2015-08-22 DIAGNOSIS — R109 Unspecified abdominal pain: Secondary | ICD-10-CM | POA: Insufficient documentation

## 2015-08-22 NOTE — Assessment & Plan Note (Signed)
On lipitor.  Low cholesterol diet and exercise.  Follow lipid panel and liver function tests.   

## 2015-08-22 NOTE — Assessment & Plan Note (Signed)
On augmentin.  Animal control already contacted.  S/p tetanus yesterday.  Dog up to date with vaccinations.

## 2015-08-22 NOTE — Assessment & Plan Note (Signed)
Doing well on trazodone.  Follow.

## 2015-08-22 NOTE — Assessment & Plan Note (Signed)
Blood pressure under good control.  Continue same medication regimen.  Follow pressures.  Follow metabolic panel.   

## 2015-08-22 NOTE — Assessment & Plan Note (Signed)
Is s/p triple bypass.  Just saw cardiology.  Cardiac evaluation ok.  Continue risk factor modification.

## 2015-08-22 NOTE — Assessment & Plan Note (Signed)
Seeing Dr Enzo Bi.

## 2015-08-22 NOTE — Assessment & Plan Note (Signed)
Some constipation.  Taking stool softener.  Is some better.  Start align daily.  Follow for triggers.  If persistent symptoms or problems, will require further testing.

## 2015-10-04 ENCOUNTER — Ambulatory Visit: Payer: BLUE CROSS/BLUE SHIELD | Admitting: Internal Medicine

## 2015-10-12 ENCOUNTER — Encounter: Payer: Self-pay | Admitting: Obstetrics and Gynecology

## 2015-10-12 ENCOUNTER — Ambulatory Visit (INDEPENDENT_AMBULATORY_CARE_PROVIDER_SITE_OTHER): Payer: BLUE CROSS/BLUE SHIELD | Admitting: Obstetrics and Gynecology

## 2015-10-12 VITALS — BP 153/64 | HR 78 | Ht 70.0 in | Wt 173.6 lb

## 2015-10-12 DIAGNOSIS — R102 Pelvic and perineal pain: Secondary | ICD-10-CM

## 2015-10-12 LAB — POCT URINALYSIS DIPSTICK
BILIRUBIN UA: NEGATIVE
Glucose, UA: NEGATIVE
Ketones, UA: NEGATIVE
Leukocytes, UA: NEGATIVE
Nitrite, UA: NEGATIVE
PH UA: 6
Protein, UA: NEGATIVE
RBC UA: NEGATIVE
Spec Grav, UA: 1.015
Urobilinogen, UA: 0.2

## 2015-10-12 NOTE — Progress Notes (Signed)
GYN ENCOUNTER NOTE  Subjective:       Alexandra Mendoza is a 65 y.o. No obstetric history on file. female is here for gynecologic evaluation of the following issues:  1. Abdominal/pelvic pain: pain started 3-4 months ago with fluctuating location and pain level. States has felt the pain in LLQ, RLQ, and epigastric. Pain sometimes radiates/shoots downwards, unsure to bladder, vagina or rectum. Pain on and off when experiencing it, and lasts 3-4 days. Describes pain as uncomfortable and like a "catch". History of constipation. Has seen her PCP and was given Colace, probiotics with good improvement. States pain had gone away for a period of time when constipation had resolved, but has returned. Denies any patterns and states cannot find correlation to diet. Currently constipation under control with occasional flare up, normal BM every few days. Denies N/V, blood in stool. Colonoscopy due (last one 11 yrs ago with normal results), intend to get one this year. Denies urinary symptoms, with no burning, no hematuria, or incontinence. Denies vaginal bleeding, discharge, dyspareunia.    Gynecologic History No LMP recorded. Patient is postmenopausal. Contraception: post menopausal status Last Pap: 04/2015, abnormal. PAP/HPV: ASCUS/negative Last mammogram: normal 2015  Obstetric History OB History  No data available    Past Medical History  Diagnosis Date  . History of chicken pox   . Hypertension   . Hyperlipidemia   . 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 Insomnia   . Acid reflux     Past Surgical History  Procedure Laterality Date  . Appendectomy    . Triple bypass    . Hysteroscopy w/d&c    . Breast surgery      Biopsy  . Lipoma removed      removed from forehead  . Breast excisional biopsy Right     negative over 5 years ago    Current Outpatient Prescriptions on File Prior to Visit  Medication Sig Dispense Refill  . amLODipine (NORVASC) 10 MG tablet Take 10 mg by mouth  daily.  4  . aspirin EC 81 MG tablet Take 81 mg by mouth daily.    Marland Kitchen atorvastatin (LIPITOR) 20 MG tablet Take 20 mg by mouth daily.    . metoprolol tartrate (LOPRESSOR) 25 MG tablet Take 25 mg by mouth 2 (two) times daily.  5  . pantoprazole (PROTONIX) 40 MG tablet Take 40 mg by mouth daily.  9  . telmisartan-hydrochlorothiazide (MICARDIS HCT) 80-25 MG per tablet Take by mouth daily.    . traZODone (DESYREL) 50 MG tablet TAKE 1 TO 2 TABLETS BY MOUTH AT BEDTIME AS NEEDED 60 tablet 1  . VITAMIN D, ERGOCALCIFEROL, PO Take by mouth.    . Omega-3 Fatty Acids (FISH OIL) 1000 MG CAPS Take 1,200 mg by mouth daily.     No current facility-administered medications on file prior to visit.    No Known Allergies  Social History   Social History  . Marital Status: Married    Spouse Name: N/A  . Number of Children: N/A  . Years of Education: N/A   Occupational History  . Not on file.   Social History Main Topics  . Smoking status: Never Smoker   . Smokeless tobacco: Never Used  . Alcohol Use: 0.0 oz/week    0 Standard drinks or equivalent per week     Comment: socially  . Drug Use: No  . Sexual Activity: Yes   Other Topics Concern  . Not on file  Social History Narrative    Family History  Problem Relation Age of Onset  . Breast cancer      maternal great aunt  . Heart disease      multiple family members  . Colon cancer Neg Hx   . Diabetes Neg Hx   . Ovarian cancer Neg Hx   . Heart disease Mother   . Heart disease Father   . Breast cancer Sister 42    The following portions of the patient's history were reviewed and updated as appropriate: allergies, current medications, past family history, past medical history, past social history, past surgical history and problem list.  Review of Systems Review of Systems - General ROS: negative for - chills, fatigue, fever, hot flashes, malaise or night sweats Gastrointestinal ROS: negative for - abdominal pain, blood in stools,  change in bowel habits and nausea/vomiting Genito-Urinary ROS: negative for - change in menstrual cycle, dysmenorrhea, dyspareunia, dysuria, genital discharge, genital ulcers, hematuria, incontinence, irregular/heavy menses, nocturia or pelvic painjj  Objective:   BP 153/64 mmHg  Pulse 78  Ht 5\' 10"  (1.778 m)  Wt 173 lb 9.6 oz (78.744 kg)  BMI 24.91 kg/m2 CONSTITUTIONAL: Well-developed, well-nourished female in no acute distress.  HENT:  Normocephalic, atraumatic.  SKIN: Skin is warm and dry. No rash noted. Not diaphoretic. No erythema. No pallor. [place]: Alert and oriented to person, place, and time.  PSYCHIATRIC: Normal mood and affect. Normal behavior. Normal judgment and thought content. CARDIOVASCULAR: Normal S1, S2. No m/g/r. RESPIRATORY: Clear to auscultation b/l. BREASTS: Not Examined ABDOMEN: Soft, non distended; Non tender.  No Organomegaly. PELVIC:  External Genitalia: Mild atrophy  BUS: Normal  Vagina: Mild atrophy  Cervix: Nabothian cyst at 5 o'clock  Uterus: Deviated to left. Normal size, shape,consistency, mobile. Fibroid at left anterior fundus, size unchanged. Nontender, was not able to reproduce pain.   Adnexa: Normal  RV: Normal external exam  Bladder: Nontender MUSCULOSKELETAL: Not Examined      Assessment:   1. Pelvic pain in female, likely not gynecologic in origin; suspect GI etiology - Urine culture - POCT urinalysis dipstick 2. Uterine fibroid, stable 3. 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 Vaginal atrophy, asymptomatic     Plan:   1. Patient pain symptoms does not appear to be related to gynecologic origin. Uterine fibroid examination unchanged since last visit. Vaginal atrophy present with no symptoms.  2. Recommend GI evaluation with colonoscopy to assess abdominal symptoms. 3. Follow up if needed after GI assessment.   Cathlean Sauer, PA-S Brayton Mars, MD   I have seen, interviewed, and examined the patient in conjunction with the [facility].A. student and affirm the diagnosis and management plan. Javeion Cannedy A. Maddyx Wieck, MD, FACOG  Note: This dictation was prepared with Dragon dictation along with smaller phrase technology. Any transcriptional errors that result from this process are unintentional.

## 2015-10-12 NOTE — Patient Instructions (Addendum)
1.Symptoms do not appear to be related to gynecologic origin. 2.  Vaginal atrophy is present.  Uterine fibroid noted on ultrasound is not changed.  On clinical exam. 3.  Recommend GI evaluation to assess laboratory.  Abdominal symptoms. 4.  Follow-up after GI work up, prn

## 2015-10-13 LAB — URINE CULTURE

## 2015-10-18 ENCOUNTER — Ambulatory Visit (INDEPENDENT_AMBULATORY_CARE_PROVIDER_SITE_OTHER): Payer: BLUE CROSS/BLUE SHIELD | Admitting: Internal Medicine

## 2015-10-18 ENCOUNTER — Encounter: Payer: Self-pay | Admitting: Internal Medicine

## 2015-10-18 VITALS — BP 138/70 | HR 68 | Temp 97.9°F | Resp 18 | Ht 70.0 in | Wt 175.0 lb

## 2015-10-18 DIAGNOSIS — E7800 Pure hypercholesterolemia, unspecified: Secondary | ICD-10-CM

## 2015-10-18 DIAGNOSIS — G4700 Insomnia, unspecified: Secondary | ICD-10-CM

## 2015-10-18 DIAGNOSIS — I10 Essential (primary) hypertension: Secondary | ICD-10-CM | POA: Diagnosis not present

## 2015-10-18 DIAGNOSIS — R109 Unspecified abdominal pain: Secondary | ICD-10-CM

## 2015-10-18 DIAGNOSIS — I2510 Atherosclerotic heart disease of native coronary artery without angina pectoris: Secondary | ICD-10-CM | POA: Diagnosis not present

## 2015-10-18 NOTE — Progress Notes (Signed)
Pre-visit discussion using our clinic review tool. No additional management support is needed unless otherwise documented below in the visit note.  

## 2015-10-18 NOTE — Progress Notes (Signed)
[name], female   DOB: 03-26-1951, 65 y.o.   MRN: [id number]   Subjective:    [name], female    DOB: 1951/06/14, 65 y.o.   MRN: [id number]  HPI  Patient with past history of hypercholesterolemia, GERD and hypertension.  She comes in today to follow up on these issues.  She tries to stay active.  No cardiac symptoms with increased activity or exertion.  No sob.  Due to f/u with cardiology in 01/2016.  Was having some discomfort in her LLQ.  Evaluated by gyn.  See Dr Andrew Au note for details.  Pelvic ultrasound reviewed.  No other abdominal pain or cramping.  Some occasional constipation.  Takes stool softeners.  Increased fiber.  No nausea or vomiting.  GYN had made comment about referral to GI.  Sleeping with trazodone.     Past Medical History  Diagnosis Date  . History of chicken pox   . Hypertension   . Hyperlipidemia   . 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 Insomnia   . Acid reflux    Past Surgical History  Procedure Laterality Date  . Appendectomy    . Triple bypass    . Hysteroscopy w/d&c    . Breast surgery      Biopsy  . Lipoma removed      removed from forehead  . Breast excisional biopsy Right     negative over 5 years ago   Family History  Problem Relation Age of Onset  . Breast cancer      maternal great aunt  . Heart disease      multiple family members  . Colon cancer Neg Hx   . Diabetes Neg Hx   . Ovarian cancer Neg Hx   . Heart disease Mother   . Heart disease Father   . Breast cancer Sister 33   Social History   Social History  . Marital Status: Married    Spouse Name: N/A  . Number of Children: N/A  . Years of Education: N/A   Social History Main Topics  . Smoking status: Never Smoker   . Smokeless tobacco: Never Used  . Alcohol Use: 0.0 oz/week    0 Standard drinks or equivalent per week     Comment: socially  . Drug Use: No  . Sexual Activity: Yes   Other Topics Concern  . None    Social History Narrative    Outpatient Encounter Prescriptions as of 10/18/2015  Medication Sig  . amLODipine (NORVASC) 10 MG tablet Take 10 mg by mouth daily.  Marland Kitchen aspirin EC 81 MG tablet Take 81 mg by mouth daily.  Mariane Baumgarten Calcium (STOOL SOFTENER PO) Take by mouth.  . metoprolol tartrate (LOPRESSOR) 25 MG tablet Take 25 mg by mouth 2 (two) times daily.  . Omega-3 Fatty Acids (FISH OIL) 1000 MG CAPS Take 1,200 mg by mouth daily.  . pantoprazole (PROTONIX) 40 MG tablet Take 40 mg by mouth daily.  . Probiotic Product (PROBIOTIC DAILY PO) Take by mouth.  . telmisartan-hydrochlorothiazide (MICARDIS HCT) 80-25 MG per tablet Take by mouth daily.  Marland Kitchen VITAMIN D, ERGOCALCIFEROL, PO Take by mouth.  . [DISCONTINUED] traZODone (DESYREL) 50 MG tablet TAKE 1 TO 2 TABLETS BY MOUTH AT BEDTIME AS NEEDED  . atorvastatin (LIPITOR) 20 MG tablet Take 20 mg by mouth daily.   No facility-administered encounter medications on file as of 10/18/2015.    Review of Systems  Constitutional: Negative for appetite change and unexpected weight change.  HENT: Negative for congestion and sinus pressure.   Respiratory: Negative for cough, chest tightness and shortness of breath.   Cardiovascular: Negative for chest pain, palpitations and leg swelling.  Gastrointestinal: Positive for constipation. Negative for nausea and vomiting.       Previous LLQ pain as outlined.    Genitourinary: Negative for dysuria and difficulty urinating.  Musculoskeletal: Negative for back pain and joint swelling.  Skin: Negative for color change and rash.  Neurological: Negative for dizziness, light-headedness and headaches.  Psychiatric/Behavioral: Negative for dysphoric mood and agitation.       Objective:     Blood pressure rechecked by me:  134/62  Physical Exam  Constitutional: She appears well-developed and well-nourished. No distress.  HENT:  Nose: Nose normal.  Mouth/Throat: Oropharynx is clear and moist.  Eyes:  Conjunctivae are normal. Right eye exhibits no discharge. Left eye exhibits no discharge.  Neck: Neck supple. No thyromegaly present.  Cardiovascular: Normal rate and regular rhythm.   Pulmonary/Chest: Breath sounds normal. No respiratory distress. She has no wheezes.  Abdominal: Soft. Bowel sounds are normal. There is no tenderness.  Musculoskeletal: She exhibits no edema or tenderness.  Lymphadenopathy:    She has no cervical adenopathy.  Skin: No rash noted. No erythema.  Psychiatric: She has a normal mood and affect. Her behavior is normal.    BP 138/70 mmHg  Pulse 68  Temp(Src) 97.9 F (36.6 C) (Oral)  Resp 18  Ht 5\' 10"  (1.778 m)  Wt 175 lb (79.379 kg)  BMI 25.11 kg/m2  SpO2 97% Wt Readings from Last 3 Encounters:  10/22/15 171 lb 6.4 oz (77.747 kg)  10/18/15 175 lb (79.379 kg)  10/12/15 173 lb 9.6 oz (78.744 kg)     Lab Results  Component Value Date   WBC 4.9 11/26/2014   HGB 13.4 11/26/2014   HCT 40 11/26/2014   PLT 197 11/26/2014   GLUCOSE 84 08/16/2015   CHOL 112 08/16/2015   TRIG 94.0 08/16/2015   HDL 35.30* 08/16/2015   LDLCALC 58 08/16/2015   ALT 18 08/16/2015   AST 17 08/16/2015   NA 141 08/16/2015   K 3.6 08/16/2015   CL 104 08/16/2015   CREATININE 0.86 08/16/2015   BUN 18 08/16/2015   CO2 31 08/16/2015   TSH 2.46 03/26/2015    Mm Digital Screening Bilateral  05/12/2015  CLINICAL DATA:  Screening. EXAM: DIGITAL SCREENING BILATERAL MAMMOGRAM WITH CAD COMPARISON:  Previous exam(s). ACR Breast Density Category c: The breast tissue is heterogeneously dense, which may obscure small masses. FINDINGS: There are no findings suspicious for malignancy. Images were processed with CAD. IMPRESSION: No mammographic evidence of malignancy. A result letter of this screening mammogram will be mailed directly to the patient. RECOMMENDATION: Screening mammogram in one year. (Code:SM-B-01Y) BI-RADS CATEGORY  1: Negative. Electronically Signed   By: Lillia Mountain M.D.   On:  05/12/2015 16:07       Assessment & Plan:   Problem List Items Addressed This Visit    Abdominal discomfort    Saw gyn.  Had pelvic ultrasound.  No significant pain on exam today.  Minimal constipation.  Fiber.  Can try miralax.  Follow.        CAD (coronary artery disease)    Is s/p triple bypass.  Sees cardiology.  Has been stable.  Continue risk factor modification.  Has f/u planned in 01/2016.        Essential hypertension -  Primary    Blood pressure under good control.  Continue same medication regimen.  Follow pressures.  Follow metabolic panel.        Relevant Orders   CBC with Differential/Platelet   Basic metabolic panel   Hypercholesterolemia    On lipitor.  Low cholesterol diet and exercise.  Follow lipid panel and liver function tests.   Lab Results  Component Value Date   CHOL 112 08/16/2015   HDL 35.30* 08/16/2015   LDLCALC 58 08/16/2015   TRIG 94.0 08/16/2015   CHOLHDL 3 08/16/2015        Relevant Orders   Lipid panel   Hepatic function panel   Insomnia    Doing well on trazodone.  Follow.          I spent 25 minutes with the patient and more than 50% of the time was spent in consultation regarding the above.     Einar Pheasant, MD

## 2015-10-21 ENCOUNTER — Other Ambulatory Visit: Payer: Self-pay | Admitting: Internal Medicine

## 2015-10-22 ENCOUNTER — Encounter: Payer: Self-pay | Admitting: Obstetrics and Gynecology

## 2015-10-22 ENCOUNTER — Ambulatory Visit (INDEPENDENT_AMBULATORY_CARE_PROVIDER_SITE_OTHER): Payer: BLUE CROSS/BLUE SHIELD | Admitting: Obstetrics and Gynecology

## 2015-10-22 VITALS — BP 168/74 | HR 85 | Ht 70.0 in | Wt 171.4 lb

## 2015-10-22 DIAGNOSIS — N6011 Diffuse cystic mastopathy of right breast: Secondary | ICD-10-CM

## 2015-10-22 DIAGNOSIS — N6012 Diffuse cystic mastopathy of left breast: Secondary | ICD-10-CM

## 2015-10-22 DIAGNOSIS — Z803 Family history of malignant neoplasm of breast: Secondary | ICD-10-CM | POA: Diagnosis not present

## 2015-10-25 DIAGNOSIS — N6012 Diffuse cystic mastopathy of left breast: Principal | ICD-10-CM

## 2015-10-25 DIAGNOSIS — Z803 Family history of malignant neoplasm of breast: Secondary | ICD-10-CM | POA: Insufficient documentation

## 2015-10-25 DIAGNOSIS — N6011 Diffuse cystic mastopathy of right breast: Secondary | ICD-10-CM | POA: Insufficient documentation

## 2015-10-25 NOTE — Patient Instructions (Signed)
1.  Self breast exam monthly. 2.  Return in 6 weeks for recheck. 3.  Will consider diagnostic mammogram and ultrasound if symptoms persist or new lump is identified.

## 2015-10-25 NOTE — Progress Notes (Signed)
Chief complaint: 1.Breast lump.  Patient presents for 1 day history of newly identified.  Palpable right breast lump.  Patient has family history of breast cancer in sister.  Currently, she is menopausal, not on hormone therapy.  Mammogram in August 2016 was normal. Patient denies nipple discharge, skin changes, enlarged lymph nodes.  Past Medical History  Diagnosis Date  . History of chicken pox   . Hypertension   . Hyperlipidemia   . 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 Insomnia   . Acid reflux     Past Surgical History  Procedure Laterality Date  . Appendectomy    . Triple bypass    . Hysteroscopy w/d&c    . Breast surgery      Biopsy  . Lipoma removed      removed from forehead  . Breast excisional biopsy Right     negative over 5 years ago   Review of systems: Per HPI.  OBJECTIVE: BP 168/74 mmHg  Pulse 85  Ht 5\' 10"  (1.778 m)  Wt 171 lb 6.4 oz (77.747 kg)  BMI 24.59 kg/m2 Pleasant white female in no acute distress. Neck: Supple, without thyromegaly or adenopathy. Breasts: Bilaterally symmetric without dominant mass, adenopathy or nipple discharge.  Nipples are everted.  There is some fibrocystic change in the form of fibroglandular thickening in the lower quadrants of the right breast greater than lower quadrants of the left breast.  No obvious identifiable abnormal masses appreciated.  ASSESSMENT: 1.  Fibrocystic changes of breasts. 2.  Recent mammogram in August 2016 normal. 3.  Family history of breast cancer in sister. 4.  Normal clinical exam.  PLAN: 1.  Monthly self breast exam. 2.  Return in 6 weeks for recheck. 3.  If symptoms persist or new lump is identified, we will proceed with diagnostic mammogram plus or minus ultrasound.  Alanda Slim Yusuf Yu, MD  A total of 15 minutes were spent face-to-face with the patient during this encounter and over half of that time dealt with counseling and coordination of care.  Note: This dictation was  prepared with Dragon dictation along with smaller phrase technology. Any transcriptional errors that result from this process are unintentional.

## 2015-11-01 ENCOUNTER — Encounter: Payer: Self-pay | Admitting: Internal Medicine

## 2015-11-01 NOTE — Assessment & Plan Note (Signed)
Doing well on trazodone.  Follow.

## 2015-11-01 NOTE — Assessment & Plan Note (Signed)
Blood pressure under good control.  Continue same medication regimen.  Follow pressures.  Follow metabolic panel.   

## 2015-11-01 NOTE — Assessment & Plan Note (Signed)
On lipitor.  Low cholesterol diet and exercise.  Follow lipid panel and liver function tests.   Lab Results  Component Value Date   CHOL 112 08/16/2015   HDL 35.30* 08/16/2015   LDLCALC 58 08/16/2015   TRIG 94.0 08/16/2015   CHOLHDL 3 08/16/2015

## 2015-11-01 NOTE — Assessment & Plan Note (Signed)
Saw gyn.  Had pelvic ultrasound.  No significant pain on exam today.  Minimal constipation.  Fiber.  Can try miralax.  Follow.

## 2015-11-01 NOTE — Assessment & Plan Note (Signed)
Is s/p triple bypass.  Sees cardiology.  Has been stable.  Continue risk factor modification.  Has f/u planned in 01/2016.

## 2015-11-11 ENCOUNTER — Telehealth: Payer: Self-pay | Admitting: Obstetrics and Gynecology

## 2015-11-11 DIAGNOSIS — N6310 Unspecified lump in the right breast, unspecified quadrant: Secondary | ICD-10-CM

## 2015-11-11 NOTE — Telephone Encounter (Signed)
Pt states that she does not notice any changes but would like an ultrasound of the breast as discussed. Will check with Dr. Tennis Must.

## 2015-11-11 NOTE — Telephone Encounter (Signed)
Dr Tennis Must told Alexandra Mendoza that if she wants a breast US she could to just call and we would take care of it.

## 2015-11-12 NOTE — Telephone Encounter (Signed)
Front desk TB, spoke to pt regarding appt

## 2015-11-12 NOTE — Telephone Encounter (Signed)
Dr approved for pt to have an ultrasound of breast, pt aware. Will have front desk schedule.

## 2015-11-24 ENCOUNTER — Ambulatory Visit
Admission: RE | Admit: 2015-11-24 | Discharge: 2015-11-24 | Disposition: A | Discharge status: Home / Self Care | Payer: BLUE CROSS/BLUE SHIELD | Source: Ambulatory Visit | Attending: Obstetrics and Gynecology | Admitting: Obstetrics and Gynecology

## 2015-11-24 ENCOUNTER — Other Ambulatory Visit: Payer: Self-pay | Admitting: Obstetrics and Gynecology

## 2015-11-24 DIAGNOSIS — R928 Other abnormal and inconclusive findings on diagnostic imaging of breast: Secondary | ICD-10-CM | POA: Diagnosis not present

## 2015-11-24 DIAGNOSIS — N6310 Unspecified lump in the right breast, unspecified quadrant: Secondary | ICD-10-CM

## 2015-11-24 DIAGNOSIS — N63 Unspecified lump in breast: Secondary | ICD-10-CM | POA: Diagnosis present

## 2015-11-30 ENCOUNTER — Ambulatory Visit (INDEPENDENT_AMBULATORY_CARE_PROVIDER_SITE_OTHER): Payer: BLUE CROSS/BLUE SHIELD | Admitting: Obstetrics and Gynecology

## 2015-11-30 ENCOUNTER — Encounter: Payer: Self-pay | Admitting: Obstetrics and Gynecology

## 2015-11-30 VITALS — BP 121/63 | HR 57 | Ht 70.0 in | Wt 171.8 lb

## 2015-11-30 DIAGNOSIS — N63 Unspecified lump in breast: Secondary | ICD-10-CM

## 2015-11-30 DIAGNOSIS — N6011 Diffuse cystic mastopathy of right breast: Secondary | ICD-10-CM | POA: Diagnosis not present

## 2015-11-30 DIAGNOSIS — N6310 Unspecified lump in the right breast, unspecified quadrant: Secondary | ICD-10-CM

## 2015-11-30 DIAGNOSIS — N6012 Diffuse cystic mastopathy of left breast: Secondary | ICD-10-CM | POA: Diagnosis not present

## 2015-11-30 NOTE — Patient Instructions (Signed)
1. Continue self breast exams monthly 2. Keep regular appointment for annual exam in August schedule

## 2015-11-30 NOTE — Progress Notes (Signed)
GYN ENCOUNTER NOTE  Subjective:       Alexandra Mendoza is a 65 y.o. No obstetric history on file. female is here for gynecologic evaluation of the following issues:  1. Breast Lump.    65 y/o F presents with newly identified right sided breat lump at 3:00 6 weeks ago. Mammogram and [place] on 11/24/15 show normal fibroglandular tissue. Pt has fhx of breast cancer. Lump is painless, with no change in size. Denies nipple discharge, overlying skin changes, weight loss, fever, or night sweats. Not currently on HRT but has previously been on it for 15-20 years.   Gynecologic History No LMP recorded. Patient is postmenopausal.  Obstetric History OB History  No data available    Past Medical History  Diagnosis Date  . History of chicken pox   . Hypertension   . Hyperlipidemia   . 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 Insomnia   . Acid reflux     Past Surgical History  Procedure Laterality Date  . Appendectomy    . Triple bypass    . Hysteroscopy w/d&c    . Breast surgery      Biopsy  . Lipoma removed      removed from forehead  . Breast excisional biopsy Right     negative over 5 years ago- neg    Current Outpatient Prescriptions on File Prior to Visit  Medication Sig Dispense Refill  . aspirin EC 81 MG tablet Take 81 mg by mouth daily.    Mariane Baumgarten Calcium (STOOL SOFTENER PO) Take by mouth.    . metoprolol tartrate (LOPRESSOR) 25 MG tablet Take 25 mg by mouth 2 (two) times daily.  5  . Omega-3 Fatty Acids (FISH OIL) 1000 MG CAPS Take 1,200 mg by mouth daily.    . pantoprazole (PROTONIX) 40 MG tablet Take 40 mg by mouth daily.  9  . Probiotic Product (PROBIOTIC DAILY PO) Take by mouth.    . telmisartan-hydrochlorothiazide (MICARDIS HCT) 80-25 MG per tablet Take by mouth daily.    . traZODone (DESYREL) 50 MG tablet TAKE 1 TO 2 TABLETS BY MOUTH AT BEDTIME AS NEEDED 60 tablet 1  . VITAMIN D, ERGOCALCIFEROL, PO Take by mouth.    Marland Kitchen amLODipine (NORVASC) 10 MG tablet Take 10  mg by mouth daily.  4   No current facility-administered medications on file prior to visit.    No Known Allergies  Social History   Social History  . Marital Status: Married    Spouse Name: N/A  . Number of Children: N/A  . Years of Education: N/A   Occupational History  . Not on file.   Social History Main Topics  . Smoking status: Never Smoker   . Smokeless tobacco: Never Used  . Alcohol Use: 0.0 oz/week    0 Standard drinks or equivalent per week     Comment: socially  . Drug Use: No  . Sexual Activity: Yes   Other Topics Concern  . Not on file   Social History Narrative    Family History  Problem Relation Age of Onset  . Breast cancer      maternal great aunt  . Heart disease      multiple family members  . Colon cancer Neg Hx   . Diabetes Neg Hx   . Ovarian cancer Neg Hx   . Heart disease Mother   . Heart disease Father   . Breast cancer Sister 53  The following portions of the patient's history were reviewed and updated as appropriate: allergies, current medications, past family history, past medical history, past social history, past surgical history and problem list.  Review of Systems Review of Systems - Breast ROS: negative for - galactorrhea, nipple changes or nipple discharge Review of Systems - General ROS: negative for - chills, fatigue, fever, hot flashes, malaise or night sweats Hematological and Lymphatic ROS: negative for - bleeding problems or swollen lymph nodes Gastrointestinal ROS: negative for - abdominal pain, blood in stools, change in bowel habits and nausea/vomiting Musculoskeletal ROS: negative for - joint pain, muscle pain or muscular weakness Genito-Urinary ROS: negative for - change in menstrual cycle, dysmenorrhea, dyspareunia, dysuria, genital discharge, genital ulcers, hematuria, incontinence, irregular/heavy menses, nocturia or pelvic painjj  Objective:   BP 121/63 mmHg  Pulse 57  Ht 5\' 10"  (1.778 m)  Wt 171 lb 12.8  oz (77.928 kg)  BMI 24.65 kg/m2 Pleasant white female in no acute distress. Neck: Supple, without thyromegaly or adenopathy. Breasts: Bilaterally symmetric without dominant mass, adenopathy or nipple discharge. Nipples are everted. There is some fibrocystic change in the form of fibroglandular thickening in the lower quadrants of the right breast greater than lower quadrants of the left breast. No obvious identifiable focal mass    Assessment:   1. Breast Lump; benign mammogram and ultrasound; physical exam stable Right Brest at 3:00 consistent with fibrocystic change   Plan:  1. Reassurance given 2. Continue self breast exam monthly 3. Return to Clinic in August 2017 for Annual Exam   Alexandra Lex, PA-S Alexandra Mars, MD   I have seen, interviewed, and examined the patient in conjunction with the [facility].A. student and affirm the diagnosis and management plan. Alexandra A. DeFrancesco, MD, FACOG   Note: This dictation was prepared with Dragon dictation along with smaller phrase technology. Any transcriptional errors that result from this process are unintentional.

## 2015-12-19 ENCOUNTER — Other Ambulatory Visit: Payer: Self-pay | Admitting: Internal Medicine

## 2016-01-20 ENCOUNTER — Other Ambulatory Visit (INDEPENDENT_AMBULATORY_CARE_PROVIDER_SITE_OTHER): Payer: BLUE CROSS/BLUE SHIELD

## 2016-01-20 DIAGNOSIS — I10 Essential (primary) hypertension: Secondary | ICD-10-CM | POA: Diagnosis not present

## 2016-01-20 DIAGNOSIS — E7800 Pure hypercholesterolemia, unspecified: Secondary | ICD-10-CM | POA: Diagnosis not present

## 2016-01-20 LAB — BASIC METABOLIC PANEL
BUN: 19 mg/dL (ref 6–23)
CO2: 31 meq/L (ref 19–32)
Calcium: 10.3 mg/dL (ref 8.4–10.5)
Chloride: 103 mEq/L (ref 96–112)
Creatinine, Ser: 0.84 mg/dL (ref 0.40–1.20)
GFR: 72.4 mL/min (ref >60.00)
GLUCOSE: 91 mg/dL (ref 70–99)
POTASSIUM: 3.5 meq/L (ref 3.5–5.1)
Sodium: 141 mEq/L (ref 135–145)

## 2016-01-20 LAB — LIPID PANEL
CHOLESTEROL: 112 mg/dL (ref 0–200)
HDL: 36.4 mg/dL — ABNORMAL LOW (ref >39.00)
LDL CALC: 61 mg/dL (ref 0–99)
NonHDL: 75.68
Total CHOL/HDL Ratio: 3
Triglycerides: 73 mg/dL (ref 0.0–149.0)
VLDL: 14.6 mg/dL (ref 0.0–40.0)

## 2016-01-20 LAB — CBC WITH DIFFERENTIAL/PLATELET
BASOS PCT: 0.6 % (ref 0.0–3.0)
Basophils Absolute: 0 10*3/uL (ref 0.0–0.1)
EOS PCT: 1.2 % (ref 0.0–5.0)
Eosinophils Absolute: 0.1 10*3/uL (ref 0.0–0.7)
HCT: 38.9 % (ref 36.0–46.0)
Hemoglobin: 13.3 g/dL (ref 12.0–15.0)
LYMPHS ABS: 2.4 10*3/uL (ref 0.7–4.0)
Lymphocytes Relative: 46.3 % — ABNORMAL HIGH (ref 12.0–46.0)
MCHC: 34.3 g/dL (ref 30.0–36.0)
MCV: 88.3 fl (ref 78.0–100.0)
MONO ABS: 0.5 10*3/uL (ref 0.1–1.0)
Monocytes Relative: 10.6 % (ref 3.0–12.0)
NEUTROS PCT: 41.3 % — AB (ref 43.0–77.0)
Neutro Abs: 2.1 10*3/uL (ref 1.4–7.7)
Platelets: 156 10*3/uL (ref 150.0–400.0)
RBC: 4.41 Mil/uL (ref 3.87–5.11)
RDW: 12.9 % (ref 11.5–15.5)
WBC: 5.1 10*3/uL (ref 4.0–10.5)

## 2016-01-20 LAB — HEPATIC FUNCTION PANEL
ALT: 15 U/L (ref 0–35)
AST: 15 U/L (ref 0–37)
Albumin: 4.4 g/dL (ref 3.5–5.2)
Alkaline Phosphatase: 47 U/L (ref 39–117)
BILIRUBIN DIRECT: 0.1 mg/dL (ref 0.0–0.3)
BILIRUBIN TOTAL: 0.6 mg/dL (ref 0.2–1.2)
Total Protein: 6.8 g/dL (ref 6.0–8.3)

## 2016-01-24 ENCOUNTER — Encounter: Payer: Self-pay | Admitting: Internal Medicine

## 2016-02-06 ENCOUNTER — Encounter: Payer: Self-pay | Admitting: Emergency Medicine

## 2016-02-06 ENCOUNTER — Emergency Department
Admission: EM | Admit: 2016-02-06 | Discharge: 2016-02-06 | Disposition: A | Discharge status: Home / Self Care | Payer: BLUE CROSS/BLUE SHIELD | Attending: Emergency Medicine | Admitting: Emergency Medicine

## 2016-02-06 DIAGNOSIS — E785 Hyperlipidemia, unspecified: Secondary | ICD-10-CM | POA: Insufficient documentation

## 2016-02-06 DIAGNOSIS — Z79899 Other long term (current) drug therapy: Secondary | ICD-10-CM | POA: Diagnosis not present

## 2016-02-06 DIAGNOSIS — I2510 Atherosclerotic heart disease of native coronary artery without angina pectoris: Secondary | ICD-10-CM | POA: Diagnosis not present

## 2016-02-06 DIAGNOSIS — E876 Hypokalemia: Secondary | ICD-10-CM

## 2016-02-06 DIAGNOSIS — Z7982 Long term (current) use of aspirin: Secondary | ICD-10-CM | POA: Diagnosis not present

## 2016-02-06 DIAGNOSIS — I10 Essential (primary) hypertension: Secondary | ICD-10-CM | POA: Insufficient documentation

## 2016-02-06 DIAGNOSIS — Z85828 Personal history of other malignant neoplasm of skin: Secondary | ICD-10-CM | POA: Diagnosis not present

## 2016-02-06 DIAGNOSIS — R531 Weakness: Secondary | ICD-10-CM | POA: Diagnosis not present

## 2016-02-06 LAB — URINALYSIS COMPLETE WITH MICROSCOPIC (ARMC ONLY)
BILIRUBIN URINE: NEGATIVE
Bacteria, UA: NONE SEEN
Glucose, UA: NEGATIVE mg/dL
Hgb urine dipstick: NEGATIVE
KETONES UR: NEGATIVE mg/dL
LEUKOCYTES UA: NEGATIVE
NITRITE: NEGATIVE
PH: 6 (ref 5.0–8.0)
PROTEIN: NEGATIVE mg/dL
Specific Gravity, Urine: 1.008 (ref 1.005–1.030)

## 2016-02-06 LAB — CBC
HEMATOCRIT: 41 % (ref 35.0–47.0)
Hemoglobin: 13.9 g/dL (ref 12.0–16.0)
MCH: 29.8 pg (ref 26.0–34.0)
MCHC: 34 g/dL (ref 32.0–36.0)
MCV: 87.5 fL (ref 80.0–100.0)
Platelets: 163 10*3/uL (ref 150–440)
RBC: 4.68 MIL/uL (ref 3.80–5.20)
RDW: 12.7 % (ref 11.5–14.5)
WBC: 6.7 10*3/uL (ref 3.6–11.0)

## 2016-02-06 LAB — GLUCOSE, CAPILLARY: Glucose-Capillary: 105 mg/dL — ABNORMAL HIGH (ref 65–99)

## 2016-02-06 LAB — BASIC METABOLIC PANEL
ANION GAP: 7 (ref 5–15)
BUN: 15 mg/dL (ref 6–20)
CALCIUM: 10.6 mg/dL — AB (ref 8.9–10.3)
CO2: 29 mmol/L (ref 22–32)
Chloride: 103 mmol/L (ref 101–111)
Creatinine, Ser: 0.95 mg/dL (ref 0.44–1.00)
GFR calc Af Amer: >60 mL/min (ref >60)
GLUCOSE: 123 mg/dL — AB (ref 65–99)
POTASSIUM: 3.1 mmol/L — AB (ref 3.5–5.1)
SODIUM: 139 mmol/L (ref 135–145)

## 2016-02-06 LAB — TROPONIN I

## 2016-02-06 MED ORDER — SODIUM CHLORIDE 0.9 % IV BOLUS (SEPSIS)
500.0000 mL | Freq: Once | INTRAVENOUS | Status: AC
  Administered 2016-02-06: 500 mL via INTRAVENOUS

## 2016-02-06 MED ORDER — POTASSIUM CHLORIDE 10 MEQ/100ML IV SOLN
10.0000 meq | Freq: Once | INTRAVENOUS | Status: AC
  Administered 2016-02-06: 10 meq via INTRAVENOUS
  Filled 2016-02-06: qty 100

## 2016-02-06 MED ORDER — POTASSIUM CHLORIDE ER 10 MEQ PO TBCR: 20.0000 meq | EXTENDED_RELEASE_TABLET | Freq: Every day | ORAL | Status: DC

## 2016-02-06 MED ORDER — POTASSIUM CHLORIDE CRYS ER 20 MEQ PO TBCR
20.0000 meq | EXTENDED_RELEASE_TABLET | Freq: Once | ORAL | Status: AC
  Administered 2016-02-06: 20 meq via ORAL
  Filled 2016-02-06 (×2): qty 1

## 2016-02-06 MED ORDER — MAGNESIUM SULFATE IN D5W 1-5 GM/100ML-% IV SOLN
1.0000 g | Freq: Once | INTRAVENOUS | Status: AC
  Administered 2016-02-06: 1 g via INTRAVENOUS
  Filled 2016-02-06: qty 100

## 2016-02-06 NOTE — ED Notes (Signed)
Patient presents to the ED with fatigue, feeling light headed, weakness, and headache intermittently x 2 weeks.  Patient states she started feeling badly since she was started on an increased dose of HCTZ-2 weeks ago.  Patient takes multiple medications for her blood pressure.  Patient reports history of triple bypass heart surgery.  Patient denies chest pain and shortness of breath.

## 2016-02-06 NOTE — Discharge Instructions (Signed)
Follow-up with Dr. Nehemiah Massed on Tuesday as planned. I recommend you have your potassium level rechecked then.  Hypokalemia Hypokalemia means that the amount of potassium in the blood is lower than normal.Potassium is a chemical, called an electrolyte, that helps regulate the amount of fluid in the body. It also stimulates muscle contraction and helps nerves function properly.Most of the body's potassium is inside of cells, and only a very small amount is in the blood. Because the amount in the blood is so small, minor changes can be life-threatening. CAUSES  Antibiotics.  Diarrhea or vomiting.  Using laxatives too much, which can cause diarrhea.  Chronic kidney disease.  Water pills (diuretics).  Eating disorders (bulimia).  Low magnesium level.  Sweating a lot. SIGNS AND SYMPTOMS  Weakness.  Constipation.  Fatigue.  Muscle cramps.  Mental confusion.  Skipped heartbeats or irregular heartbeat (palpitations).  Tingling or numbness. DIAGNOSIS  Your health care provider can diagnose hypokalemia with blood tests. In addition to checking your potassium level, your health care provider may also check other lab tests. TREATMENT Hypokalemia can be treated with potassium supplements taken by mouth or adjustments in your current medicines. If your potassium level is very low, you may need to get potassium through a vein (IV) and be monitored in the hospital. A diet high in potassium is also helpful. Foods high in potassium are:  Nuts, such as peanuts and pistachios.  Seeds, such as sunflower seeds and pumpkin seeds.  Peas, lentils, and lima beans.  Whole grain and bran cereals and breads.  Fresh fruit and vegetables, such as apricots, avocado, bananas, cantaloupe, kiwi, oranges, tomatoes, asparagus, and potatoes.  Orange and tomato juices.  Red meats.  Fruit yogurt. HOME CARE INSTRUCTIONS  Take all medicines as prescribed by your health care provider.  Maintain a  healthy diet by including nutritious food, such as fruits, vegetables, nuts, whole grains, and lean meats.  If you are taking a laxative, be sure to follow the directions on the label. SEEK MEDICAL CARE IF:  Your weakness gets worse.  You feel your heart pounding or racing.  You are vomiting or having diarrhea.  You are diabetic and having trouble keeping your blood glucose in the normal range. SEEK IMMEDIATE MEDICAL CARE IF:  You have chest pain, shortness of breath, or dizziness.  You are vomiting or having diarrhea for more than 2 days.  You faint. MAKE SURE YOU:   Understand these instructions.  Will watch your condition.  Will get help right away if you are not doing well or get worse.   This information is not intended to replace advice given to you by your health care provider. Make sure you discuss any questions you have with your health care provider.   Document Released: 08/28/2005 Document Revised: 09/18/2014 Document Reviewed: 02/28/2013 Elsevier Interactive Patient Education [employer].

## 2016-02-06 NOTE — ED Provider Notes (Signed)
[facility] Emergency Department Provider Note  ____________________________________________  Time seen: Approximately 3:00 PM  I have reviewed the triage vital signs and the nursing notes.   HISTORY  Chief Complaint Headache; Fatigue; and Weakness    HPI Alexandra Mendoza is a 65 y.o. female previous history of coronary disease and quadruple bypass.  The patient reports that about 2-3 weeks ago she had a routine follow-up with her doctor, at that time her blood pressure was high in the clinic as she reports her blood pressures have been fairly normal when mom turned home. They increased her hydrochlorothiazide by adding 25 mg to her current regimen. After about 1-2 days she started experiencing a feeling of slight fatigue and general weakness. She denies any chest pain shortness of breath or trouble breathing. No abdominal pain fevers chills or evidence of "illness." She does not have any trouble speaking, weakness in one arm or leg.  Patient reports she had a low potassium once and again for similar symptoms and she suspects this could be happening along with being slightly dehydrated.   She has a follow-up appointment on Tuesday with cardiology.  Past Medical History  Diagnosis Date  . History of chicken pox   . Hypertension   . Hyperlipidemia   . 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 Insomnia   . Acid reflux     Patient Active Problem List   Diagnosis Date Noted  . Family history of breast cancer in first degree relative 10/25/2015  . Fibrocystic breast changes of both breasts 10/25/2015  . Abdominal discomfort 08/22/2015  . Dog bite 08/16/2015  . Abnormal vaginal Pap smear 05/18/2015  . Adaptation reaction 05/05/2015  . Anxiety 05/05/2015  . History of chicken pox 05/05/2015  . HLD (hyperlipidemia) 05/05/2015  . 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 Osteoarthrosis 05/05/2015  . Adiposity 05/05/2015  . H/O coronary artery bypass  surgery 05/05/2015  . CAD (coronary artery disease) 03/14/2015  . Essential hypertension 03/14/2015  . Hypercholesterolemia 03/14/2015  . Health care maintenance 03/14/2015  . Insomnia 03/08/2015  . Esophagitis, reflux 12/11/2014  . AF (paroxysmal atrial fibrillation) ([place]) 06/29/2014  . Arteriosclerosis of bypass graft of coronary artery 06/01/2014  . Billowing mitral valve 06/01/2014  . 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 Appendectomy    . Triple bypass    . Hysteroscopy w/d&c    . Breast surgery      Biopsy  . Lipoma removed      removed from forehead  . Breast excisional biopsy Right     negative over 5 years ago- neg    Current Outpatient Rx  Name  Route  Sig  Dispense  Refill  . amLODipine (NORVASC) 10 MG tablet   Oral   Take 10 mg by mouth daily.      4   . aspirin EC 81 MG tablet   Oral   Take 81 mg by mouth daily.         Marland Kitchen atorvastatin (LIPITOR) 20 MG tablet      TAKE 1 TABLET (20 MG TOTAL) BY MOUTH ONCE DAILY.      5   . Docusate Calcium (STOOL SOFTENER PO)   Oral   Take by mouth.         . metoprolol tartrate (LOPRESSOR) 25 MG tablet   Oral   Take 25  mg by mouth 2 (two) times daily.      5   . Omega-3 Fatty Acids (FISH OIL) 1000 MG CAPS   Oral   Take 1,200 mg by mouth daily.         . pantoprazole (PROTONIX) 40 MG tablet   Oral   Take 40 mg by mouth daily.      9   . potassium chloride (K-DUR) 10 MEQ tablet   Oral   Take 2 tablets (20 mEq total) by mouth daily.   4 tablet   0   . Probiotic Product (PROBIOTIC DAILY PO)   Oral   Take by mouth.         . telmisartan-hydrochlorothiazide (MICARDIS HCT) 80-25 MG per tablet   Oral   Take by mouth daily.         . traZODone (DESYREL) 50 MG tablet      TAKE 1 TO 2 TABLETS BY MOUTH AT BEDTIME AS NEEDED   60 tablet   2   . VITAMIN D, ERGOCALCIFEROL, PO   Oral   Take  by mouth.           Allergies Review of patient's allergies indicates no known allergies.  Family History  Problem Relation Age of Onset  . Breast cancer      maternal great aunt  . Heart disease      multiple family members  . Colon cancer Neg Hx   . Diabetes Neg Hx   . Ovarian cancer Neg Hx   . Heart disease Mother   . Heart disease Father   . Breast cancer Sister 61    Social History Social History  Substance Use Topics  . Smoking status: Never Smoker   . Smokeless tobacco: Never Used  . Alcohol Use: 0.0 oz/week    0 Standard drinks or equivalent per week     Comment: socially    Review of Systems Constitutional: No fever/chills Eyes: No visual changes. ENT: No sore throat. Cardiovascular: Denies chest pain. Respiratory: Denies shortness of breath. Gastrointestinal: No abdominal pain.  No nausea, no vomiting.  No diarrhea.  No constipation. Genitourinary: Negative for dysuria. Musculoskeletal: Negative for back pain. Skin: Negative for rash. Neurological: Negative for headaches, focal weakness or numbness.  10-point ROS otherwise negative.  ____________________________________________   PHYSICAL EXAM:  VITAL SIGNS: ED Triage Vitals  Enc Vitals Group     BP 02/06/16 1342 167/71 mmHg     Pulse Rate 02/06/16 1342 18     Resp 02/06/16 1342 18     Temp 02/06/16 1342 98.4 F (36.9 C)     Temp Source 02/06/16 1342 Oral     SpO2 02/06/16 1342 100 %     Weight 02/06/16 1342 160 lb (72.576 kg)     Height 02/06/16 1342 5' 10.5" (1.791 m)     Head Cir --      Peak Flow --      Pain Score 02/06/16 1343 0     Pain Loc --      Pain Edu? --      Excl. in [place]? --    Constitutional: Alert and oriented. Well appearing and in no acute distress.Very pleasant. Family at bedside also very pleasant. Eyes: Conjunctivae are normal. PERRL. EOMI. Head: Atraumatic. Nose: No congestion/rhinnorhea. Mouth/Throat: Mucous membranes are slightly dry.   Neck: No stridor.    Cardiovascular: Normal rate, regular rhythm. Grossly normal heart sounds.  Good peripheral circulation. Respiratory: Normal respiratory effort.  No  retractions. Lungs CTAB. Gastrointestinal: Soft and nontender. No distention. No abdominal bruits.  Musculoskeletal: No lower extremity tenderness nor edema.  No joint effusions. Neurologic:  Normal speech and language. No gross focal neurologic deficits are appreciated. No gait instability. Skin:  Skin is warm, dry and intact. No rash noted. Psychiatric: Mood and affect are normal. Speech and behavior are normal.  ____________________________________________   LABS (all labs ordered are listed, but only abnormal results are displayed)  Labs Reviewed  BASIC METABOLIC PANEL - Abnormal; Notable for the following:    Potassium 3.1 (*)    Glucose, Bld 123 (*)    Calcium 10.6 (*)    All other components within normal limits  URINALYSIS COMPLETEWITH MICROSCOPIC ([facility] ONLY) - Abnormal; Notable for the following:    Color, Urine STRAW (*)    APPearance CLEAR (*)    Squamous Epithelial / LPF 0-5 (*)    All other components within normal limits  GLUCOSE, CAPILLARY - Abnormal; Notable for the following:    Glucose-Capillary 105 (*)    All other components within normal limits  CBC  TROPONIN I  CBG MONITORING, ED   ____________________________________________  EKG  Reviewed injury by me at 1400 Ventricular rate 70 PR 145 QTC 435 Nonspecific T-wave abnormality seen in multiple leads, including slight flattening as well as inversions noted in V2 and slight in V3. It is notable the patient is not complaining of any cardiac symptoms at this time.  Discussed with patient's cardiologist Dr. Josefa Half who advises ok for outpatient follow-up and replete potassium. I agree. ____________________________________________  RADIOLOGY  ____________________________________________    PROCEDURES  Procedure(s) performed: None  Critical Care  performed: No  ____________________________________________   INITIAL IMPRESSION / ASSESSMENT AND PLAN / ED COURSE  Pertinent labs & imaging results that were available during my care of the patient were reviewed by me and considered in my medical decision making (see chart for details).  Patient presents for generalized feeling of fatigue. Overall very reassuring exam was good and normal muscle strength. No evidence of neurologic deficit or neurologic symptoms. Review of her labs, I suspect likely the elevated dose of hydrochlorothiazide has caused her to have mild hypokalemia and she is feeling fatigued secondary to this. There is no evidence of acute cardiac disease though her EKG is slightly changed, but no evidence of an obvious ischemic note and she is not having any cardiac or pulmonary symptoms.  We will hydrate her and she appears slightly dehydrated, we'll also replete potassium. Discussed with the patient and we will treat her for low potassium, she will stop the added hydrochlorothiazide and she has follow-up on Tuesday which would be appropriate for recheck of her potassium and symptom medics improvement.   ____________________________________________   FINAL CLINICAL IMPRESSION(S) / ED DIAGNOSES  Final diagnoses:  Hypokalemia  General weakness      Delman Kitten, MD 02/06/16 2315

## 2016-02-06 NOTE — ED Notes (Signed)
Patient states that she was started on a HCTZ 25 mg 2 weeks ago when she saw her cardiologist because her blood pressure was running high at her visit, since then she has been feeling tired and weak. Patient states that she has episodes where she feels drained, nervous, and "headacheish".   Patient denies chest pain and shortness of breath.

## 2016-02-16 ENCOUNTER — Ambulatory Visit: Payer: BLUE CROSS/BLUE SHIELD | Admitting: Internal Medicine

## 2016-03-15 ENCOUNTER — Other Ambulatory Visit: Payer: Self-pay | Admitting: Internal Medicine

## 2016-03-15 ENCOUNTER — Encounter: Payer: Self-pay | Admitting: Internal Medicine

## 2016-03-15 ENCOUNTER — Telehealth: Payer: Self-pay | Admitting: Gastroenterology

## 2016-03-15 MED ORDER — TRAZODONE HCL 50 MG PO TABS: 50.0000 mg | ORAL_TABLET | Freq: Every evening | ORAL | Status: DC | PRN

## 2016-03-15 NOTE — Telephone Encounter (Signed)
ok'd refill trazodone #60 with one refill.

## 2016-03-15 NOTE — Telephone Encounter (Signed)
Please call for colonoscopy screening

## 2016-03-15 NOTE — Telephone Encounter (Signed)
rx sent in to pharmacy for trazodone #180 with no refills.   Note placed the cancel the other rx for #60 with one refill.

## 2016-03-20 NOTE — Telephone Encounter (Signed)
Patient is calling back to let you know she would like her colonoscopy scheduled in September.

## 2016-03-23 ENCOUNTER — Other Ambulatory Visit: Payer: Self-pay

## 2016-03-23 ENCOUNTER — Telehealth: Payer: Self-pay

## 2016-03-23 NOTE — Telephone Encounter (Signed)
Gastroenterology Pre-Procedure Review  Request Date: 05/16/2016 Requesting Physician:    PATIENT REVIEW QUESTIONS: The patient responded to the following health history questions as indicated:    1. Are you having any GI issues? no 2. Do you have a personal history of Polyps? no 3. Do you have a family history of Colon Cancer or Polyps? no 4. Diabetes Mellitus? no 5. Joint replacements in the past 12 months?no 6. Major health problems in the past 3 months?no 7. Any artificial heart valves, MVP, or defibrillator?no    MEDICATIONS & ALLERGIES:    Patient reports the following regarding taking any anticoagulation/antiplatelet therapy:   Plavix, Coumadin, Eliquis, Xarelto, Lovenox, Pradaxa, Brilinta, or Effient? no Aspirin? yes (blood thinner)  Patient confirms/reports the following medications:  Current Outpatient Prescriptions  Medication Sig Dispense Refill  . amLODipine (NORVASC) 10 MG tablet Take 10 mg by mouth daily.  4  . aspirin EC 81 MG tablet Take 81 mg by mouth daily.    Marland Kitchen atorvastatin (LIPITOR) 20 MG tablet TAKE 1 TABLET (20 MG TOTAL) BY MOUTH ONCE DAILY.  5  . Docusate Calcium (STOOL SOFTENER PO) Take by mouth.    . metoprolol tartrate (LOPRESSOR) 25 MG tablet Take 25 mg by mouth 2 (two) times daily.  5  . Omega-3 Fatty Acids (FISH OIL) 1000 MG CAPS Take 1,200 mg by mouth daily.    . pantoprazole (PROTONIX) 40 MG tablet Take 40 mg by mouth daily.  9  . Probiotic Product (PROBIOTIC DAILY PO) Take by mouth.    . telmisartan-hydrochlorothiazide (MICARDIS HCT) 80-25 MG per tablet Take by mouth daily.    . traZODone (DESYREL) 50 MG tablet Take 1-2 tablets (50-100 mg total) by mouth at bedtime as needed. 180 tablet 0  . VITAMIN D, ERGOCALCIFEROL, PO Take by mouth.    . potassium chloride (K-DUR) 10 MEQ tablet Take 2 tablets (20 mEq total) by mouth daily. (Patient not taking: Reported on 03/23/2016) 4 tablet 0   No current facility-administered medications for this visit.     Patient confirms/reports the following allergies:  No Known Allergies  No orders of the defined types were placed in this encounter.    AUTHORIZATION INFORMATION Primary Insurance: 1D#: Group #:  Secondary Insurance: 1D#: Group #:  SCHEDULE INFORMATION: Date: 05/16/2016   Time: Location: [facility]

## 2016-03-23 NOTE — Telephone Encounter (Signed)
Scheduled Colonoscopy at [facility] 05/16/2016. Colonoscopy screening Z12.11. Please pre cert

## 2016-04-05 ENCOUNTER — Other Ambulatory Visit: Payer: Self-pay | Admitting: Obstetrics and Gynecology

## 2016-04-05 DIAGNOSIS — Z1231 Encounter for screening mammogram for malignant neoplasm of breast: Secondary | ICD-10-CM

## 2016-04-18 ENCOUNTER — Ambulatory Visit (INDEPENDENT_AMBULATORY_CARE_PROVIDER_SITE_OTHER): Payer: BLUE CROSS/BLUE SHIELD | Admitting: Internal Medicine

## 2016-04-18 ENCOUNTER — Encounter: Payer: Self-pay | Admitting: Internal Medicine

## 2016-04-18 VITALS — BP 152/76 | HR 62 | Temp 98.2°F | Resp 12 | Wt 171.0 lb

## 2016-04-18 DIAGNOSIS — I10 Essential (primary) hypertension: Secondary | ICD-10-CM | POA: Diagnosis not present

## 2016-04-18 DIAGNOSIS — E7800 Pure hypercholesterolemia, unspecified: Secondary | ICD-10-CM | POA: Diagnosis not present

## 2016-04-18 DIAGNOSIS — N6011 Diffuse cystic mastopathy of right breast: Secondary | ICD-10-CM

## 2016-04-18 DIAGNOSIS — N6012 Diffuse cystic mastopathy of left breast: Secondary | ICD-10-CM

## 2016-04-18 DIAGNOSIS — Z1159 Encounter for screening for other viral diseases: Secondary | ICD-10-CM | POA: Diagnosis not present

## 2016-04-18 DIAGNOSIS — I2510 Atherosclerotic heart disease of native coronary artery without angina pectoris: Secondary | ICD-10-CM

## 2016-04-18 NOTE — Progress Notes (Signed)
Pre visit review using our clinic review tool, if applicable. No additional management support is needed unless otherwise documented below in the visit note. 

## 2016-04-18 NOTE — Assessment & Plan Note (Signed)
Scheduled for mammogram in 05/2016.

## 2016-04-18 NOTE — Progress Notes (Signed)
[name], female   DOB: 1951/04/23, 65 y.o.   MRN: [id number]   Subjective:    [name], female    DOB: 09-27-1950, 65 y.o.   MRN: [id number]  HPI  Patient here for a scheduled follow up.  She had an issue with elevated blood pressure while at cardiology.  They added HCTZ.  She became weak.  To ER.  Low potassium.  Hydrated.  Potassium replaced.  Feels better.  Off the additional HCTZ now.  No chest pain.  No sob.  No acid reflux.  No abdominal pain or cramping.  Bowels stable.  Overall feels she is doing well.  Had f/u with gyn.  Everything checked out fine.  Scheduled for colonoscopy and mammogram in 05/2016.     Past Medical History:  Diagnosis Date  . Acid reflux   . 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 History of chicken pox   . Hyperlipidemia   . Hypertension   . Insomnia    Past Surgical History:  Procedure Laterality Date  . APPENDECTOMY    . BREAST EXCISIONAL BIOPSY Right    negative over 5 years ago- neg  . BREAST SURGERY     Biopsy  . HYSTEROSCOPY W/D&C    . lipoma removed     removed from forehead  . triple bypass     Family History  Problem Relation Age of Onset  . Breast cancer      maternal great aunt  . Heart disease      multiple family members  . Heart disease Mother   . Heart disease Father   . Breast cancer Sister 21  . Colon cancer Neg Hx   . Diabetes Neg Hx   . Ovarian cancer Neg Hx    Social History   Social History  . Marital status: Married    Spouse name: N/A  . Number of children: N/A  . Years of education: N/A   Social History Main Topics  . Smoking status: Never Smoker  . Smokeless tobacco: Never Used  . Alcohol use 0.0 oz/week     Comment: socially  . Drug use: No  . Sexual activity: Yes   Other Topics Concern  . None   Social History Narrative  . None    Outpatient Encounter Prescriptions as of 04/18/2016  Medication Sig  . amLODipine (NORVASC) 10 MG tablet Take 10 mg by  mouth daily.  Marland Kitchen aspirin EC 81 MG tablet Take 81 mg by mouth daily.  Marland Kitchen atorvastatin (LIPITOR) 20 MG tablet TAKE 1 TABLET (20 MG TOTAL) BY MOUTH ONCE DAILY.  Marland Kitchen Docusate Calcium (STOOL SOFTENER PO) Take by mouth.  . hydrochlorothiazide (HYDRODIURIL) 25 MG tablet TAKE 1 TABLET (25 MG TOTAL) BY MOUTH ONCE DAILY.  Marland Kitchen KLOR-CON M10 10 MEQ tablet TAKE 1 TABLET (10 MEQ TOTAL) BY MOUTH ONCE DAILY.  . metoprolol tartrate (LOPRESSOR) 25 MG tablet Take 25 mg by mouth 2 (two) times daily.  . Omega-3 Fatty Acids (FISH OIL) 1000 MG CAPS Take 1,200 mg by mouth daily.  . pantoprazole (PROTONIX) 40 MG tablet Take 40 mg by mouth daily.  . Probiotic Product (PROBIOTIC DAILY PO) Take by mouth.  . telmisartan (MICARDIS) 80 MG tablet TAKE 1 TABLET (80 MG TOTAL) BY MOUTH ONCE DAILY.  . traZODone (DESYREL) 50 MG tablet Take 1-2 tablets (50-100 mg total) by mouth at bedtime as needed. (Patient taking differently: Take 50-100 mg by mouth at  bedtime. )  . VITAMIN D, ERGOCALCIFEROL, PO Take by mouth.  . [DISCONTINUED] potassium chloride (K-DUR) 10 MEQ tablet Take 2 tablets (20 mEq total) by mouth daily. (Patient taking differently: Take 10 mEq by mouth once. )  . [DISCONTINUED] telmisartan-hydrochlorothiazide (MICARDIS HCT) 80-25 MG per tablet Take by mouth daily.   No facility-administered encounter medications on file as of 04/18/2016.     Review of Systems  Constitutional: Negative for appetite change and unexpected weight change.  HENT: Negative for congestion and sinus pressure.   Respiratory: Negative for cough, chest tightness and shortness of breath.   Cardiovascular: Negative for chest pain, palpitations and leg swelling.  Gastrointestinal: Negative for abdominal pain, diarrhea, nausea and vomiting.  Musculoskeletal: Negative for back pain and joint swelling.  Skin: Negative for color change and rash.  Neurological: Negative for dizziness, light-headedness and headaches.  Psychiatric/Behavioral: Negative for  agitation and dysphoric mood.       Objective:     Blood pressure rechecked by me:  142 -146/72  Physical Exam  Constitutional: She appears well-developed and well-nourished. No distress.  HENT:  Nose: Nose normal.  Mouth/Throat: Oropharynx is clear and moist.  Neck: Neck supple. No thyromegaly present.  Cardiovascular: Normal rate and regular rhythm.   Pulmonary/Chest: Breath sounds normal. No respiratory distress. She has no wheezes.  Abdominal: Soft. Bowel sounds are normal. There is no tenderness.  Musculoskeletal: She exhibits no edema or tenderness.  Lymphadenopathy:    She has no cervical adenopathy.  Skin: No rash noted. No erythema.  Psychiatric: She has a normal mood and affect. Her behavior is normal.    BP (!) 152/76   Pulse 62   Temp 98.2 F (36.8 C)   Resp 12   Wt 171 lb (77.6 kg)   BMI 24.19 kg/m  Wt Readings from Last 3 Encounters:  04/18/16 171 lb (77.6 kg)  02/06/16 160 lb (72.6 kg)  11/30/15 171 lb 12.8 oz (77.9 kg)     Lab Results  Component Value Date   WBC 6.7 02/06/2016   HGB 13.9 02/06/2016   HCT 41.0 02/06/2016   PLT 163 02/06/2016   GLUCOSE 123 (H) 02/06/2016   CHOL 112 01/20/2016   TRIG 73.0 01/20/2016   HDL 36.40 (L) 01/20/2016   LDLCALC 61 01/20/2016   ALT 15 01/20/2016   AST 15 01/20/2016   NA 139 02/06/2016   K 3.1 (L) 02/06/2016   CL 103 02/06/2016   CREATININE 0.95 02/06/2016   BUN 15 02/06/2016   CO2 29 02/06/2016   TSH 2.46 03/26/2015       Assessment & Plan:   Problem List Items Addressed This Visit    CAD (coronary artery disease)    S/p triple bypass.  Followed by cardiology.  Stable.  Continue risk factor modification.        Relevant Medications   hydrochlorothiazide (HYDRODIURIL) 25 MG tablet   telmisartan (MICARDIS) 80 MG tablet   Essential hypertension    Her blood pressure at home averages 130/60-70.  Slightly elevated here.  She will continue to spot check her pressure.  Same medication regimen.   Follow pressures.  Hold on making any adjustments.       Relevant Medications   hydrochlorothiazide (HYDRODIURIL) 25 MG tablet   telmisartan (MICARDIS) 80 MG tablet   Other Relevant Orders   Basic metabolic panel   TSH   Fibrocystic breast changes of both breasts    Scheduled for mammogram in 05/2016.  Hypercholesterolemia    On lipitor.  Low cholesterol diet and exercise.  Follow lipid panel and liver function tests.        Relevant Medications   hydrochlorothiazide (HYDRODIURIL) 25 MG tablet   telmisartan (MICARDIS) 80 MG tablet   Other Relevant Orders   Lipid panel   Hepatic function panel    Other Visit Diagnoses    Need for hepatitis C screening test    -  Primary   Relevant Orders   Hepatitis C antibody       Einar Pheasant, MD

## 2016-04-18 NOTE — Assessment & Plan Note (Signed)
Her blood pressure at home averages 130/60-70.  Slightly elevated here.  She will continue to spot check her pressure.  Same medication regimen.  Follow pressures.  Hold on making any adjustments.

## 2016-04-18 NOTE — Assessment & Plan Note (Signed)
S/p triple bypass.  Followed by cardiology.  Stable.  Continue risk factor modification.

## 2016-04-18 NOTE — Assessment & Plan Note (Signed)
On lipitor.  Low cholesterol diet and exercise.  Follow lipid panel and liver function tests.   

## 2016-04-19 NOTE — Telephone Encounter (Signed)
Patient is aware that her surgery will not effect her colonoscopy. No clearance is neccessary

## 2016-04-19 NOTE — Telephone Encounter (Signed)
Patient wants you to know that she had triple heart bypass in August 2015. In case you may need a clearance. You may call patient if you have any questions.

## 2016-05-09 ENCOUNTER — Encounter: Payer: Self-pay | Admitting: Internal Medicine

## 2016-05-12 ENCOUNTER — Encounter: Payer: Self-pay | Admitting: *Deleted

## 2016-05-12 ENCOUNTER — Other Ambulatory Visit: Payer: Self-pay | Admitting: Obstetrics and Gynecology

## 2016-05-12 ENCOUNTER — Ambulatory Visit
Admission: RE | Admit: 2016-05-12 | Discharge: 2016-05-12 | Disposition: A | Discharge status: Home / Self Care | Payer: PPO | Source: Ambulatory Visit | Attending: Obstetrics and Gynecology | Admitting: Obstetrics and Gynecology

## 2016-05-12 DIAGNOSIS — Z1231 Encounter for screening mammogram for malignant neoplasm of breast: Secondary | ICD-10-CM

## 2016-05-12 LAB — HM MAMMOGRAPHY

## 2016-05-16 ENCOUNTER — Ambulatory Visit: Payer: PPO | Admitting: Anesthesiology

## 2016-05-16 ENCOUNTER — Encounter: Payer: Self-pay | Admitting: *Deleted

## 2016-05-16 ENCOUNTER — Ambulatory Visit
Admission: RE | Admit: 2016-05-16 | Discharge: 2016-05-16 | Disposition: A | Discharge status: Home / Self Care | Payer: PPO | Source: Ambulatory Visit | Attending: Gastroenterology | Admitting: Gastroenterology

## 2016-05-16 ENCOUNTER — Encounter
Admission: RE | Disposition: A | Discharge status: Home / Self Care | Payer: Self-pay | Source: Ambulatory Visit | Attending: Gastroenterology

## 2016-05-16 DIAGNOSIS — E785 Hyperlipidemia, unspecified: Secondary | ICD-10-CM | POA: Insufficient documentation

## 2016-05-16 DIAGNOSIS — I10 Essential (primary) hypertension: Secondary | ICD-10-CM | POA: Insufficient documentation

## 2016-05-16 DIAGNOSIS — Z1211 Encounter for screening for malignant neoplasm of colon: Secondary | ICD-10-CM | POA: Diagnosis not present

## 2016-05-16 DIAGNOSIS — Z951 Presence of aortocoronary bypass graft: Secondary | ICD-10-CM | POA: Diagnosis not present

## 2016-05-16 DIAGNOSIS — D123 Benign neoplasm of transverse colon: Secondary | ICD-10-CM | POA: Insufficient documentation

## 2016-05-16 DIAGNOSIS — I2510 Atherosclerotic heart disease of native coronary artery without angina pectoris: Secondary | ICD-10-CM | POA: Insufficient documentation

## 2016-05-16 DIAGNOSIS — K635 Polyp of colon: Secondary | ICD-10-CM | POA: Diagnosis not present

## 2016-05-16 DIAGNOSIS — Z7982 Long term (current) use of aspirin: Secondary | ICD-10-CM | POA: Diagnosis not present

## 2016-05-16 DIAGNOSIS — I25810 Atherosclerosis of coronary artery bypass graft(s) without angina pectoris: Secondary | ICD-10-CM | POA: Diagnosis not present

## 2016-05-16 DIAGNOSIS — M1990 Unspecified osteoarthritis, unspecified site: Secondary | ICD-10-CM | POA: Diagnosis not present

## 2016-05-16 DIAGNOSIS — K219 Gastro-esophageal reflux disease without esophagitis: Secondary | ICD-10-CM | POA: Diagnosis not present

## 2016-05-16 HISTORY — DX: Atherosclerotic heart disease of native coronary artery without angina pectoris: I25.10

## 2016-05-16 HISTORY — DX: Cardiac murmur, unspecified: R01.1

## 2016-05-16 HISTORY — PX: COLONOSCOPY WITH PROPOFOL: SHX5780

## 2016-05-16 SURGERY — COLONOSCOPY WITH PROPOFOL
Anesthesia: General

## 2016-05-16 MED ORDER — PROPOFOL 10 MG/ML IV BOLUS
INTRAVENOUS | Status: DC | PRN
  Administered 2016-05-16 (×2): 50 mg via INTRAVENOUS

## 2016-05-16 MED ORDER — SODIUM CHLORIDE 0.9 % IV SOLN
INTRAVENOUS | Status: DC
  Administered 2016-05-16: 1000 mL via INTRAVENOUS

## 2016-05-16 MED ORDER — GLYCOPYRROLATE 0.2 MG/ML IJ SOLN
INTRAMUSCULAR | Status: DC | PRN
  Administered 2016-05-16: 0.2 mg via INTRAVENOUS

## 2016-05-16 MED ORDER — EPHEDRINE SULFATE 50 MG/ML IJ SOLN
INTRAMUSCULAR | Status: DC | PRN
  Administered 2016-05-16: 5 mg via INTRAVENOUS

## 2016-05-16 MED ORDER — PROPOFOL 500 MG/50ML IV EMUL
INTRAVENOUS | Status: DC | PRN
Start: 2016-05-16 — End: 2016-05-16
  Administered 2016-05-16: 75 ug/kg/min via INTRAVENOUS

## 2016-05-16 MED ORDER — LACTATED RINGERS IV SOLN
INTRAVENOUS | Status: DC | PRN
  Administered 2016-05-16: 08:00:00 via INTRAVENOUS

## 2016-05-16 NOTE — Op Note (Signed)
[facility] Patient Name: Alexandra Mendoza Procedure Date: 05/16/2016 7:30 AM MRN: [id number] Account #: [id number] Date of Birth: [birth year] Admit Type: Outpatient Age: 65 Room: [facility] ENDO ROOM 4 Gender: Female Note Status: Finalized Procedure:            Colonoscopy Indications:          Screening for colorectal malignant neoplasm Providers:            [name] MD, MD Referring MD:         Einar Pheasant, MD (Referring MD) Medicines:            Propofol per Anesthesia Complications:        No immediate complications. Procedure:            Pre-Anesthesia Assessment:                       - Prior to the procedure, a History and Physical was                        performed, and patient medications and allergies were                        reviewed. The patient's tolerance of previous                        anesthesia was also reviewed. The risks and benefits of                        the procedure and the sedation options and risks were                        discussed with the patient. All questions were                        answered, and informed consent was obtained. Prior                        Anticoagulants: The patient has taken no previous                        anticoagulant or antiplatelet agents. ASA Grade                        Assessment: II - A patient with mild systemic disease.                        After reviewing the risks and benefits, the patient was                        deemed in satisfactory condition to undergo the                        procedure.                       After obtaining informed consent, the colonoscope was                        passed under direct vision. Throughout the procedure,  the patient's blood pressure, pulse, and oxygen                        saturations were monitored continuously. The                        Colonoscope was introduced through the anus and         advanced to the the cecum, identified by appendiceal                        orifice and ileocecal valve. The colonoscopy was                        performed without difficulty. The patient tolerated the                        procedure well. The quality of the bowel preparation                        was good. Findings:      The perianal and digital rectal examinations were normal.      A 3 mm polyp was found in the transverse colon. The polyp was sessile.       The polyp was removed with a cold biopsy forceps. Resection and       retrieval were complete. Impression:           - One 3 mm polyp in the transverse colon, removed with                        a cold biopsy forceps. Resected and retrieved. Recommendation:       - Await pathology results.                       - Repeat colonoscopy in 5 years if polyp adenoma and 10                        years if hyperplastic Procedure Code(s):    --- Professional ---                       [id number], Colonoscopy, flexible; with biopsy, single or                        multiple Diagnosis Code(s):    --- Professional ---                       Z12.11, Encounter for screening for malignant neoplasm                        of colon                       D12.3, Benign neoplasm of transverse colon (hepatic                        flexure or splenic flexure) CPT copyright 2016 [employer]. All rights reserved. The codes documented in this report are preliminary and upon coder review may  be revised to meet current compliance requirements. [name] MD, MD 05/16/2016 8:19:45 AM This report has been signed electronically.  Number of Addenda: 0 Note Initiated On: 05/16/2016 7:30 AM Scope Withdrawal Time: 0 hours 6 minutes 4 seconds  Total Procedure Duration: 0 hours 10 minutes 45 seconds       [facility]

## 2016-05-16 NOTE — H&P (Signed)
Alexandra Lame, MD [name] [address], [place],  [id number] Phone: [phone] Fax : [phone]  Primary Care Physician:  Einar Pheasant, MD Primary Gastroenterologist:  Dr. Allen Norris  Pre-Procedure History & Physical: HPI:  Alexandra Mendoza is a 65 y.o. female is here for a screening colonoscopy.   Past Medical History:  Diagnosis Date  . Acid reflux   . Coronary artery disease   . 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 Heart murmur   . History of chicken pox   . Hyperlipidemia   . Hypertension   . Insomnia     Past Surgical History:  Procedure Laterality Date  . APPENDECTOMY    . BREAST EXCISIONAL BIOPSY Right    negative over 5 years ago- neg  . BREAST SURGERY     Biopsy  . HYSTEROSCOPY W/D&C    . lipoma removed     removed from forehead  . triple bypass      Prior to Admission medications   Medication Sig Start Date End Date Taking? Authorizing Provider  metoprolol tartrate (LOPRESSOR) 25 MG tablet Take 25 mg by mouth 2 (two) times daily. 02/05/15  Yes Historical Provider, MD  amLODipine (NORVASC) 10 MG tablet Take 10 mg by mouth daily. 03/08/15   Historical Provider, MD  aspirin EC 81 MG tablet Take 81 mg by mouth daily. 10/04/12   Historical Provider, MD  atorvastatin (LIPITOR) 20 MG tablet TAKE 1 TABLET (20 MG TOTAL) BY MOUTH ONCE DAILY. 11/22/15   Historical Provider, MD  Docusate Calcium (STOOL SOFTENER PO) Take by mouth.    Historical Provider, MD  hydrochlorothiazide (HYDRODIURIL) 25 MG tablet TAKE 1 TABLET (25 MG TOTAL) BY MOUTH ONCE DAILY. 03/15/16   Historical Provider, MD  KLOR-CON M10 10 MEQ tablet TAKE 1 TABLET (10 MEQ TOTAL) BY MOUTH ONCE DAILY. 03/07/16   Historical Provider, MD  Omega-3 Fatty Acids (FISH OIL) 1000 MG CAPS Take 1,200 mg by mouth daily.    Historical Provider, MD  pantoprazole (PROTONIX) 40 MG tablet Take 40 mg by mouth daily. 02/18/15   Historical Provider, MD  Probiotic Product (PROBIOTIC DAILY PO) Take by mouth.     Historical Provider, MD  telmisartan (MICARDIS) 80 MG tablet TAKE 1 TABLET (80 MG TOTAL) BY MOUTH ONCE DAILY. 03/15/16   Historical Provider, MD  traZODone (DESYREL) 50 MG tablet Take 1-2 tablets (50-100 mg total) by mouth at bedtime as needed. Patient taking differently: Take 50-100 mg by mouth at bedtime.  03/15/16   Einar Pheasant, MD  VITAMIN D, ERGOCALCIFEROL, PO Take by mouth.    Historical Provider, MD    Allergies as of 03/23/2016  . (No Known Allergies)    Family History  Problem Relation Age of Onset  . Breast cancer      maternal great aunt  . Heart disease      multiple family members  . Heart disease Mother   . Heart disease Father   . Breast cancer Sister 59  . Colon cancer Neg Hx   . Diabetes Neg Hx   . Ovarian cancer Neg Hx     Social History   Social History  . Marital status: Married    Spouse name: N/A  . Number of children: N/A  . Years of education: N/A   Occupational History  . Not on file.   Social History Main Topics  . Smoking status: Never Smoker  . Smokeless tobacco: Never Used  . Alcohol use 0.0 oz/week  Comment: socially  . Drug use: No  . Sexual activity: Yes   Other Topics Concern  . Not on file   Social History Narrative  . No narrative on file    Review of Systems: See HPI, otherwise negative ROS  Physical Exam: BP (!) 183/72   Pulse 61   Temp (!) 96.8 F (36 C) (Tympanic)   Resp 18   Ht 5\' 11"  (1.803 m)   Wt 165 lb (74.8 kg)   SpO2 100%   BMI 23.01 kg/m  General:   Alert,  pleasant and cooperative in NAD Head:  Normocephalic and atraumatic. Neck:  Supple; no masses or thyromegaly. Lungs:  Clear throughout to auscultation.    Heart:  Regular rate and rhythm. Abdomen:  Soft, nontender and nondistended. Normal bowel sounds, without guarding, and without rebound.   Neurologic:  Alert and  oriented x4;  grossly normal neurologically.  Impression/Plan: Alexandra Mendoza is now here to undergo a screening  colonoscopy.  Risks, benefits, and alternatives regarding colonoscopy have been reviewed with the patient.  Questions have been answered.  All parties agreeable.

## 2016-05-16 NOTE — Transfer of Care (Signed)
Immediate Anesthesia Transfer of Care Note  Patient: Alexandra Mendoza  Procedure(s) Performed: Procedure(s): COLONOSCOPY WITH PROPOFOL (N/A)  Patient Location: PACU and Endoscopy Unit  Anesthesia Type:General  Level of Consciousness: awake, alert  and oriented  Airway & Oxygen Therapy: Patient Spontanous Breathing and Patient connected to nasal cannula oxygen  Post-op Assessment: Report given to RN and Post -op Vital signs reviewed and stable  Post vital signs: Reviewed and stable  Last Vitals:  Vitals:   05/16/16 0715  BP: (!) 183/72  Pulse: 61  Resp: 18  Temp: (!) 36 C    Last Pain:  Vitals:   05/16/16 0715  TempSrc: Tympanic         Complications: No apparent anesthesia complications

## 2016-05-16 NOTE — Anesthesia Preprocedure Evaluation (Signed)
Anesthesia Evaluation   Patient awake    Reviewed: Allergy & Precautions, NPO status , Patient's Chart, lab work & pertinent test results, reviewed documented beta blocker date and time   History of Anesthesia Complications Negative for: history of anesthetic complications  Airway Mallampati: II  TM Distance: >3 FB Neck ROM: Full    Dental no notable dental hx.    Pulmonary neg pulmonary ROS, neg sleep apnea, neg COPD,    breath sounds clear to auscultation- rhonchi (-) wheezing      Cardiovascular Exercise Tolerance: Good hypertension, Pt. on medications and Pt. on home beta blockers + CAD and + CABG   Rhythm:Regular Rate:Normal - Systolic murmurs and - Diastolic murmurs Echo stress test 06/30/15: EF 55%, no significant valvular lesions, no evidence of ischemia    Neuro/Psych Anxiety negative neurological ROS     GI/Hepatic Neg liver ROS, GERD  ,  Endo/Other  negative endocrine ROSneg diabetes  Renal/GU negative Renal ROS     Musculoskeletal  (+) Arthritis , Osteoarthritis,    Abdominal (+) - obese,   Peds  Hematology negative hematology ROS (+)   Anesthesia Other Findings Past Medical History: No date: Acid reflux No date: Coronary artery disease No date: Heart disease     Comment: 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) No date: Heart murmur No date: History of chicken pox No date: Hyperlipidemia No date: Hypertension No date: Insomnia   Reproductive/Obstetrics                             Anesthesia Physical Anesthesia Plan  ASA: III  Anesthesia Plan: General   Post-op Pain Management:    Induction: Intravenous  Airway Management Planned: Natural Airway  Additional Equipment:   Intra-op Plan:   Post-operative Plan:   Informed Consent: I have reviewed the patients History and Physical, chart, labs and discussed the procedure including the risks, benefits and alternatives for  the proposed anesthesia with the patient or authorized representative who has indicated his/her understanding and acceptance.   Dental advisory given  Plan Discussed with: CRNA and Anesthesiologist  Anesthesia Plan Comments:         Anesthesia Quick Evaluation

## 2016-05-16 NOTE — Anesthesia Postprocedure Evaluation (Signed)
Anesthesia Post Note  Patient: Alexandra Mendoza  Procedure(s) Performed: Procedure(s) (LRB): COLONOSCOPY WITH PROPOFOL (N/A)  Patient location during evaluation: Endoscopy Anesthesia Type: General Level of consciousness: awake and alert and oriented Pain management: pain level controlled Vital Signs Assessment: post-procedure vital signs reviewed and stable Respiratory status: spontaneous breathing, nonlabored ventilation and respiratory function stable Cardiovascular status: blood pressure returned to baseline and stable Postop Assessment: no signs of nausea or vomiting Anesthetic complications: no    Last Vitals:  Vitals:   05/16/16 0820 05/16/16 0830  BP: 128/64 139/70  Pulse: 78   Resp: 14   Temp: 36.4 C     Last Pain:  Vitals:   05/16/16 0820  TempSrc: Tympanic                 Kinsie Belford

## 2016-05-17 ENCOUNTER — Encounter: Payer: Self-pay | Admitting: Gastroenterology

## 2016-05-18 LAB — SURGICAL PATHOLOGY

## 2016-05-19 ENCOUNTER — Encounter: Payer: Self-pay | Admitting: Gastroenterology

## 2016-05-30 ENCOUNTER — Other Ambulatory Visit (INDEPENDENT_AMBULATORY_CARE_PROVIDER_SITE_OTHER): Payer: PPO

## 2016-05-30 DIAGNOSIS — E7800 Pure hypercholesterolemia, unspecified: Secondary | ICD-10-CM | POA: Diagnosis not present

## 2016-05-30 DIAGNOSIS — Z1159 Encounter for screening for other viral diseases: Secondary | ICD-10-CM

## 2016-05-30 DIAGNOSIS — I10 Essential (primary) hypertension: Secondary | ICD-10-CM | POA: Diagnosis not present

## 2016-05-30 LAB — LIPID PANEL
Cholesterol: 110 mg/dL (ref 0–200)
HDL: 41.6 mg/dL (ref >39.00)
LDL Cholesterol: 55 mg/dL (ref 0–99)
NONHDL: 68.4
TRIGLYCERIDES: 67 mg/dL (ref 0.0–149.0)
Total CHOL/HDL Ratio: 3
VLDL: 13.4 mg/dL (ref 0.0–40.0)

## 2016-05-30 LAB — BASIC METABOLIC PANEL
BUN: 17 mg/dL (ref 6–23)
CALCIUM: 9.9 mg/dL (ref 8.4–10.5)
CO2: 32 meq/L (ref 19–32)
CREATININE: 0.95 mg/dL (ref 0.40–1.20)
Chloride: 105 mEq/L (ref 96–112)
GFR: 62.75 mL/min (ref >60.00)
Glucose, Bld: 78 mg/dL (ref 70–99)
Potassium: 3.7 mEq/L (ref 3.5–5.1)
Sodium: 141 mEq/L (ref 135–145)

## 2016-05-30 LAB — HEPATIC FUNCTION PANEL
ALBUMIN: 4.1 g/dL (ref 3.5–5.2)
ALK PHOS: 50 U/L (ref 39–117)
ALT: 16 U/L (ref 0–35)
AST: 16 U/L (ref 0–37)
Bilirubin, Direct: 0 mg/dL (ref 0.0–0.3)
TOTAL PROTEIN: 6.8 g/dL (ref 6.0–8.3)
Total Bilirubin: 0.5 mg/dL (ref 0.2–1.2)

## 2016-05-30 LAB — TSH: TSH: 2.65 u[IU]/mL (ref 0.35–4.50)

## 2016-05-30 NOTE — Progress Notes (Signed)
ANNUAL PREVENTATIVE CARE GYN  ENCOUNTER NOTE  Subjective:       Alexandra Mendoza is a 65 y.o. G1P1001. female here for a routine annual gynecologic exam.  Current complaints: 1. Annual gynecologic exam 2. Menopause asymptomatic  Has no vasomotor symptoms of menopause. No urinary or bowel changes. No pain. Still sexually active.   Gynecologic History No LMP recorded. Patient is postmenopausal. Contraception: post menopausal status Last Pap: 04/2015 ascus/neg. Results were:  Last mammogram: 05/12/2016 birad 1. Results were: normal  Obstetric History OB History  Gravida Para Term Preterm AB Living  1 1 1     1   SAB TAB Ectopic Multiple Live Births          1    # Outcome Date GA Lbr Len/2nd Weight Sex Delivery Anes PTL Lv  1 Term 1975   8 lb 2.1 oz (3.688 kg) M Vag-Spont   LIV      Past Medical History:  Diagnosis Date  . Acid reflux   . Coronary artery disease   . 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 Heart murmur   . History of chicken pox   . Hyperlipidemia   . Hypertension   . Insomnia     Past Surgical History:  Procedure Laterality Date  . APPENDECTOMY    . BREAST EXCISIONAL BIOPSY Right    negative over 5 years ago- neg  . BREAST SURGERY     Biopsy  . COLONOSCOPY WITH PROPOFOL N/A 05/16/2016   Procedure: COLONOSCOPY WITH PROPOFOL;  Surgeon: [name], MD;  Location: [facility] ENDOSCOPY;  Service: Endoscopy;  Laterality: N/A;  . HYSTEROSCOPY W/D&C    . lipoma removed     removed from forehead  . triple bypass      Current Outpatient Prescriptions on File Prior to Visit  Medication Sig Dispense Refill  . amLODipine (NORVASC) 10 MG tablet Take 10 mg by mouth daily.  4  . aspirin EC 81 MG tablet Take 81 mg by mouth daily.    Marland Kitchen atorvastatin (LIPITOR) 20 MG tablet TAKE 1 TABLET (20 MG TOTAL) BY MOUTH ONCE DAILY.  5  . Docusate Calcium (STOOL SOFTENER PO) Take by mouth.    . hydrochlorothiazide (HYDRODIURIL) 25 MG tablet TAKE 1 TABLET (25 MG TOTAL) BY MOUTH  ONCE DAILY.  0  . KLOR-CON M10 10 MEQ tablet TAKE 1 TABLET (10 MEQ TOTAL) BY MOUTH ONCE DAILY.  5  . metoprolol tartrate (LOPRESSOR) 25 MG tablet Take 25 mg by mouth 2 (two) times daily.  5  . Omega-3 Fatty Acids (FISH OIL) 1000 MG CAPS Take 1,200 mg by mouth daily.    . pantoprazole (PROTONIX) 40 MG tablet Take 40 mg by mouth daily.  9  . Probiotic Product (PROBIOTIC DAILY PO) Take by mouth.    . telmisartan (MICARDIS) 80 MG tablet TAKE 1 TABLET (80 MG TOTAL) BY MOUTH ONCE DAILY.  0  . traZODone (DESYREL) 50 MG tablet Take 1-2 tablets (50-100 mg total) by mouth at bedtime as needed. (Patient taking differently: Take 50-100 mg by mouth at bedtime. ) 180 tablet 0  . VITAMIN D, ERGOCALCIFEROL, PO Take by mouth.     No current facility-administered medications on file prior to visit.     No Known Allergies  Social History   Social History  . Marital status: Married    Spouse name: N/A  . Number of children: N/A  . Years of education: N/A   Occupational History  .  Not on file.   Social History Main Topics  . Smoking status: Never Smoker  . Smokeless tobacco: Never Used  . Alcohol use 0.0 oz/week     Comment: socially  . Drug use: No  . Sexual activity: Yes    Birth control/ protection: Post-menopausal   Other Topics Concern  . Not on file   Social History Narrative  . No narrative on file    Family History  Problem Relation Age of Onset  . Breast cancer      maternal great aunt  . Heart disease      multiple family members  . Heart disease Mother   . Heart disease Father   . Breast cancer Sister 56  . Colon cancer Neg Hx   . Diabetes Neg Hx   . Ovarian cancer Neg Hx     The following portions of the patient's history were reviewed and updated as appropriate: allergies, current medications, past family history, past medical history, past social history, past surgical history and problem list.  Review of Systems ROS Review of Systems - General ROS: negative for -  chills, fatigue, fever, hot flashes, night sweats, weight gain or weight loss Psychological ROS: negative for - anxiety, decreased libido, depression, mood swings, physical abuse or sexual abuse Ophthalmic ROS: negative for - blurry vision, eye pain or loss of vision ENT ROS: negative for - headaches, hearing change, visual changes or vocal changes Allergy and Immunology ROS: negative for - hives, itchy/watery eyes or seasonal allergies Hematological and Lymphatic ROS: negative for - bleeding problems, bruising, swollen lymph nodes or weight loss Endocrine ROS: negative for - galactorrhea, hair pattern changes, hot flashes, malaise/lethargy, mood swings, palpitations, polydipsia/polyuria, skin changes, temperature intolerance or unexpected weight changes Breast ROS: negative for - new or changing breast lumps or nipple discharge Respiratory ROS: negative for - cough or shortness of breath Cardiovascular ROS: negative for - chest pain, irregular heartbeat, palpitations or shortness of breath Gastrointestinal ROS: no abdominal pain, change in bowel habits, or black or bloody stools Genito-Urinary ROS: no dysuria, trouble voiding, or hematuria Musculoskeletal ROS: negative for - joint pain or joint stiffness Neurological ROS: negative for - bowel and bladder control changes Dermatological ROS: negative for rash and skin lesion changes   Objective:   BP (!) 152/74   Pulse 65   Ht 5\' 11"  (1.803 m)   Wt 169 lb 11.2 oz (77 kg)   BMI 23.67 kg/m  CONSTITUTIONAL: Well-developed, well-nourished female in no acute distress.  PSYCHIATRIC: Normal mood and affect. Normal behavior. Normal judgment and thought content. [place]: Alert and oriented to person, place, and time. Normal muscle tone coordination. No cranial nerve deficit noted. HENT:  Normocephalic, atraumatic, External right and left ear normal. Oropharynx is clear and moist EYES: Conjunctivae and EOM are normal No scleral icterus.  NECK:  Normal range of motion, supple, no masses.  Normal thyroid.  SKIN: Skin is warm and dry. No rash noted. Not diaphoretic. No erythema. No pallor. CARDIOVASCULAR: Normal heart rate noted, regular rhythm, no murmur. RESPIRATORY: Clear to auscultation bilaterally. Effort and breath sounds normal, no problems with respiration noted. BREASTS: Symmetric in size. No masses, skin changes, nipple drainage, or lymphadenopathy. ABDOMEN: Soft, normal bowel sounds, no distention noted.  No tenderness, rebound or guarding.  BLADDER: Normal PELVIC:  External Genitalia: Normal  BUS: Normal  Vagina: Fair E2 effect, no CMT, mobile, soft.   Uterus: Normal shape, size, and consistency. Midplane and mobile. No tenderness.  Adnexa: No  masses or tenderness noted.  RV: External Exam NormaI, No Rectal Masses and Normal Sphincter tone  MUSCULOSKELETAL: Normal range of motion. No tenderness.  No cyanosis, clubbing, or edema.  2+ distal pulses. LYMPHATIC: No Axillary, Supraclavicular, or Inguinal Adenopathy.    Assessment:   Annual gynecologic examination 65 y.o. Contraception: post menopausal status Normal BMI Problem List Items Addressed This Visit    None    Visit Diagnoses    Well woman exam with routine gynecological exam    -  Primary   Screening for colon cancer          Plan:  Pap: Pap, Reflex if ASCUS Mammogram: utd Stool Guaiac Testing:  colonoscopy 05/16/2016- removed colon polyp- neg Labs: thru pcp Routine preventative health maintenance measures emphasized: Exercise/Diet/Weight control, Tobacco Warnings, Alcohol/Substance use risks, Stress Management, Peer Pressure Issues and Safe Sex Return to Clinic - [place], CMA [place], Student-PA  Brayton Mars, MD  Note: This dictation was prepared with Dragon dictation along with smaller phrase technology. Any transcriptional errors that result from this process are unintentional.

## 2016-05-31 ENCOUNTER — Encounter: Payer: Self-pay | Admitting: Obstetrics and Gynecology

## 2016-05-31 ENCOUNTER — Encounter: Payer: BLUE CROSS/BLUE SHIELD | Admitting: Obstetrics and Gynecology

## 2016-05-31 ENCOUNTER — Encounter: Payer: Self-pay | Admitting: Internal Medicine

## 2016-05-31 ENCOUNTER — Ambulatory Visit (INDEPENDENT_AMBULATORY_CARE_PROVIDER_SITE_OTHER): Payer: PRIVATE HEALTH INSURANCE | Admitting: Obstetrics and Gynecology

## 2016-05-31 VITALS — BP 152/74 | HR 65 | Ht 71.0 in | Wt 169.7 lb

## 2016-05-31 DIAGNOSIS — N6011 Diffuse cystic mastopathy of right breast: Secondary | ICD-10-CM

## 2016-05-31 DIAGNOSIS — Z01419 Encounter for gynecological examination (general) (routine) without abnormal findings: Secondary | ICD-10-CM | POA: Diagnosis not present

## 2016-05-31 DIAGNOSIS — N6012 Diffuse cystic mastopathy of left breast: Secondary | ICD-10-CM

## 2016-05-31 DIAGNOSIS — Z1211 Encounter for screening for malignant neoplasm of colon: Secondary | ICD-10-CM | POA: Diagnosis not present

## 2016-05-31 LAB — HEPATITIS C ANTIBODY: HCV AB: NEGATIVE

## 2016-05-31 NOTE — Patient Instructions (Addendum)
1. Pap smear is done 2. Mammogram already completed-normal 3. Colonoscopy has been done this year. 4. Continue with healthy eating and exercise 5. Screening labs are ordered by PCP 6. Return in 1 year for annual exam

## 2016-06-05 ENCOUNTER — Encounter: Payer: Self-pay | Admitting: Obstetrics and Gynecology

## 2016-06-05 LAB — PAP IG W/ RFLX HPV ASCU: PAP SMEAR COMMENT: 0

## 2016-06-06 DIAGNOSIS — R001 Bradycardia, unspecified: Secondary | ICD-10-CM | POA: Diagnosis not present

## 2016-06-06 DIAGNOSIS — E782 Mixed hyperlipidemia: Secondary | ICD-10-CM | POA: Diagnosis not present

## 2016-06-06 DIAGNOSIS — K210 Gastro-esophageal reflux disease with esophagitis: Secondary | ICD-10-CM | POA: Diagnosis not present

## 2016-06-06 DIAGNOSIS — I25810 Atherosclerosis of coronary artery bypass graft(s) without angina pectoris: Secondary | ICD-10-CM | POA: Diagnosis not present

## 2016-06-22 ENCOUNTER — Ambulatory Visit (INDEPENDENT_AMBULATORY_CARE_PROVIDER_SITE_OTHER): Payer: PPO

## 2016-06-22 DIAGNOSIS — Z23 Encounter for immunization: Secondary | ICD-10-CM | POA: Diagnosis not present

## 2016-06-22 NOTE — Progress Notes (Signed)
Patient received the flu shot 

## 2016-07-07 ENCOUNTER — Ambulatory Visit (INDEPENDENT_AMBULATORY_CARE_PROVIDER_SITE_OTHER): Payer: PPO | Admitting: Family Medicine

## 2016-07-07 DIAGNOSIS — G44209 Tension-type headache, unspecified, not intractable: Secondary | ICD-10-CM

## 2016-07-07 DIAGNOSIS — R51 Headache: Secondary | ICD-10-CM

## 2016-07-07 DIAGNOSIS — R519 Headache, unspecified: Secondary | ICD-10-CM | POA: Insufficient documentation

## 2016-07-07 NOTE — Assessment & Plan Note (Signed)
Patient with mild dull intermittent right frontal headache over the last month. Location could argue for sinus related headache given that she has discomfort occasionally in the frontal sinus area and maxillary sinus area on the right. Could be tension headache. Could be related to her blood pressure as it is elevated today though typically is well-controlled at home. Neurologically intact so doubt intracranial pathology. Doubt temporal arteritis given lack of tenderness. I discussed this with the patient. She'll start on Flonase and Claritin to see if this helps with sinus related symptoms. Discussed obtaining imaging though she wanted to defer this until after she has tried [facility] and Claritin. She'll contact us in 1 week to let us know how she is doing with this. If not improved would obtain imaging at that time. Given return precautions.

## 2016-07-07 NOTE — Progress Notes (Signed)
Pre visit review using our clinic review tool, if applicable. No additional management support is needed unless otherwise documented below in the visit note. 

## 2016-07-07 NOTE — Progress Notes (Signed)
  Tommi Rumps, MD Phone: [phone]  Alexandra Mendoza is a 65 y.o. female who presents today for same-day visit.  Patient notes for about the last month she's had a dull intermittent right frontal headache that will occasionally bother her in her frontal sinus area and maxillary sinus area and right temple. She notes no vision changes, numbness, or weakness with this. No sudden onset worst headache of life. No fevers. Notes it is a dull and pulsating sensation at times. Other times does not bother her. She tried nasal saline spray with little benefit. Has a history of migraines in the past though this is not consistent with prior migraines. She reports she checks her blood pressure at home and it is typically 120-130/60-80. Typically goes up when she goes to the doctor's office. No CP or SOB.  ROS see history of present illness  Objective  Physical Exam Vitals:   07/07/16 1303  BP: (!) 168/66  Pulse: 90  Temp: 97.6 F (36.4 C)    BP Readings from Last 3 Encounters:  07/07/16 (!) 168/66  05/31/16 (!) 152/74  05/16/16 (!) 144/85   Wt Readings from Last 3 Encounters:  07/07/16 169 lb 6.4 oz (76.8 kg)  05/31/16 169 lb 11.2 oz (77 kg)  05/16/16 165 lb (74.8 kg)    Physical Exam  Constitutional: She is well-developed, well-nourished, and in no distress.  HENT:  Head: Normocephalic and atraumatic.  Mouth/Throat: Oropharynx is clear and moist. No oropharyngeal exudate.  Temples nontender, normal TMs bilaterally  Eyes: Conjunctivae are normal. Pupils are equal, round, and reactive to light.  Cardiovascular: Normal rate, regular rhythm and normal heart sounds.   Pulmonary/Chest: Effort normal and breath sounds normal.  Neurological: She is alert. Gait normal.  CN 2-12 intact, 5/5 strength in bilateral biceps, triceps, grip, quads, hamstrings, plantar and dorsiflexion, sensation to light touch intact in bilateral UE and LE, normal gait, 2+ patellar reflexes  Skin: Skin is warm and  dry.     Assessment/Plan: Please see individual problem list.  Headache Patient with mild dull intermittent right frontal headache over the last month. Location could argue for sinus related headache given that she has discomfort occasionally in the frontal sinus area and maxillary sinus area on the right. Could be tension headache. Could be related to her blood pressure as it is elevated today though typically is well-controlled at home. Neurologically intact so doubt intracranial pathology. Doubt temporal arteritis given lack of tenderness. I discussed this with the patient. She'll start on Flonase and Claritin to see if this helps with sinus related symptoms. Discussed obtaining imaging though she wanted to defer this until after she has tried [facility] and Claritin. She'll contact us in 1 week to let us know how she is doing with this. If not improved would obtain imaging at that time. Given return precautions.   Tommi Rumps, MD [place]

## 2016-07-07 NOTE — Patient Instructions (Signed)
Nice to see you. Your headaches could be related to sinus issues. You should start on Flonase 2 sprays each nostril daily. Start on Claritin 10 mg daily. Do not use decongestants. If your symptoms are not improving we should consider obtaining imaging of your head. Please call us in 1 week to see how you're doing. If you develop worsening headache, numbness, weakness, vision changes, sudden onset worst headache of her life, or any new or change in symptoms please seek medical attention.

## 2016-07-12 ENCOUNTER — Encounter: Payer: Self-pay | Admitting: Family Medicine

## 2016-07-13 ENCOUNTER — Ambulatory Visit (INDEPENDENT_AMBULATORY_CARE_PROVIDER_SITE_OTHER): Payer: PPO | Admitting: Internal Medicine

## 2016-07-13 ENCOUNTER — Encounter: Payer: Self-pay | Admitting: Internal Medicine

## 2016-07-13 VITALS — BP 158/70 | HR 72 | Temp 98.1°F | Ht 71.0 in | Wt 170.2 lb

## 2016-07-13 DIAGNOSIS — I10 Essential (primary) hypertension: Secondary | ICD-10-CM

## 2016-07-13 DIAGNOSIS — G4700 Insomnia, unspecified: Secondary | ICD-10-CM | POA: Diagnosis not present

## 2016-07-13 DIAGNOSIS — G44209 Tension-type headache, unspecified, not intractable: Secondary | ICD-10-CM | POA: Diagnosis not present

## 2016-07-13 LAB — CBC WITH DIFFERENTIAL/PLATELET
BASOS ABS: 0 10*3/uL (ref 0.0–0.1)
Basophils Relative: 0.7 % (ref 0.0–3.0)
EOS ABS: 0.1 10*3/uL (ref 0.0–0.7)
EOS PCT: 0.8 % (ref 0.0–5.0)
HCT: 42.7 % (ref 36.0–46.0)
HEMOGLOBIN: 14.2 g/dL (ref 12.0–15.0)
Lymphocytes Relative: 37.4 % (ref 12.0–46.0)
Lymphs Abs: 2.4 10*3/uL (ref 0.7–4.0)
MCHC: 33.3 g/dL (ref 30.0–36.0)
MCV: 89.4 fl (ref 78.0–100.0)
MONO ABS: 0.6 10*3/uL (ref 0.1–1.0)
Monocytes Relative: 9.7 % (ref 3.0–12.0)
Neutro Abs: 3.3 10*3/uL (ref 1.4–7.7)
Neutrophils Relative %: 51.4 % (ref 43.0–77.0)
Platelets: 175 10*3/uL (ref 150.0–400.0)
RBC: 4.77 Mil/uL (ref 3.87–5.11)
RDW: 12.9 % (ref 11.5–15.5)
WBC: 6.5 10*3/uL (ref 4.0–10.5)

## 2016-07-13 LAB — BASIC METABOLIC PANEL
BUN: 16 mg/dL (ref 6–23)
CHLORIDE: 100 meq/L (ref 96–112)
CO2: 30 meq/L (ref 19–32)
Calcium: 10.9 mg/dL — ABNORMAL HIGH (ref 8.4–10.5)
Creatinine, Ser: 0.83 mg/dL (ref 0.40–1.20)
GFR: 73.3 mL/min (ref >60.00)
GLUCOSE: 94 mg/dL (ref 70–99)
POTASSIUM: 3.9 meq/L (ref 3.5–5.1)
SODIUM: 139 meq/L (ref 135–145)

## 2016-07-13 LAB — SEDIMENTATION RATE: SED RATE: 9 mm/h (ref 0–30)

## 2016-07-13 NOTE — Progress Notes (Signed)
Pre visit review using our clinic review tool, if applicable. No additional management support is needed unless otherwise documented below in the visit note. 

## 2016-07-13 NOTE — Progress Notes (Signed)
[name], female   DOB: 05/20/1951, 65 y.o.   MRN: [id number]   Subjective:    [name], female    DOB: 09/20/1950, 65 y.o.   MRN: [id number]  HPI  Patient here as a work in with concerns regarding intermittent headache - off and on for one month.  Also has recently noticed blood pressure elevated.  She was evaluated 07/07/16 by Dr Caryl Bis - for headaches. Note reviewed.  States for the last 4-5 weeks, has noticed right side intermittent headache.  May last 5-10 minutes and then resolves.  Described as occasionally noticing a pulsating headache.  Was evaluated 07/07/16.  Was placed on claritin and flonase.  Did not help.  Using saline.  No nasal congestion.  No sinus pressure.  She wears readers.  No change in her vision.  No rash.  Has a history of migraine headaches.  States this feels different.  No increased headache with bending forward or sneezing.  Blood pressure has been elevated - averaging 130-140/70-80.     Past Medical History:  Diagnosis Date  . Acid reflux   . Coronary artery disease   . 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 Heart murmur   . History of chicken pox   . Hyperlipidemia   . Hypertension   . Insomnia    Past Surgical History:  Procedure Laterality Date  . APPENDECTOMY    . BREAST EXCISIONAL BIOPSY Right    negative over 5 years ago- neg  . BREAST SURGERY     Biopsy  . COLONOSCOPY WITH PROPOFOL N/A 05/16/2016   Procedure: COLONOSCOPY WITH PROPOFOL;  Surgeon: [name], MD;  Location: [facility] ENDOSCOPY;  Service: Endoscopy;  Laterality: N/A;  . HYSTEROSCOPY W/D&C    . lipoma removed     removed from forehead  . triple bypass     Family History  Problem Relation Age of Onset  . Breast cancer      maternal great aunt  . Heart disease      multiple family members  . Heart disease Mother   . Heart disease Father   . Breast cancer Sister 79  . Colon cancer Neg Hx   . Diabetes Neg Hx   . Ovarian cancer Neg  Hx    Social History   Social History  . Marital status: Married    Spouse name: N/A  . Number of children: N/A  . Years of education: N/A   Social History Main Topics  . Smoking status: Never Smoker  . Smokeless tobacco: Never Used  . Alcohol use 0.0 oz/week     Comment: socially  . Drug use: No  . Sexual activity: Yes    Birth control/ protection: Post-menopausal   Other Topics Concern  . None   Social History Narrative  . None    Outpatient Encounter Prescriptions as of 07/13/2016  Medication Sig  . amLODipine (NORVASC) 10 MG tablet Take 10 mg by mouth daily.  Marland Kitchen aspirin EC 81 MG tablet Take 81 mg by mouth daily.  Marland Kitchen atorvastatin (LIPITOR) 20 MG tablet TAKE 1 TABLET (20 MG TOTAL) BY MOUTH ONCE DAILY.  Marland Kitchen Docusate Calcium (STOOL SOFTENER PO) Take by mouth.  . hydrochlorothiazide (HYDRODIURIL) 25 MG tablet TAKE 1 TABLET (25 MG TOTAL) BY MOUTH ONCE DAILY.  . metoprolol tartrate (LOPRESSOR) 25 MG tablet Take 25 mg by mouth 2 (two) times daily.  . Omega-3 Fatty Acids (FISH OIL) 1000 MG CAPS  Take 1,200 mg by mouth daily.  . Probiotic Product (PROBIOTIC DAILY PO) Take by mouth.  . telmisartan (MICARDIS) 80 MG tablet TAKE 1 TABLET (80 MG TOTAL) BY MOUTH ONCE DAILY.  . traZODone (DESYREL) 50 MG tablet Take 1-2 tablets (50-100 mg total) by mouth at bedtime as needed. (Patient taking differently: Take 50-100 mg by mouth at bedtime. )  . VITAMIN D, ERGOCALCIFEROL, PO Take by mouth.  . [DISCONTINUED] KLOR-CON M10 10 MEQ tablet TAKE 1 TABLET (10 MEQ TOTAL) BY MOUTH ONCE DAILY.  . [DISCONTINUED] pantoprazole (PROTONIX) 40 MG tablet Take 40 mg by mouth daily.   No facility-administered encounter medications on file as of 07/13/2016.     Review of Systems  Constitutional: Negative for appetite change, fever and unexpected weight change.  HENT: Negative for congestion and sinus pressure.   Respiratory: Negative for cough, chest tightness and shortness of breath.   Cardiovascular:  Negative for chest pain, palpitations and leg swelling.  Gastrointestinal: Negative for nausea and vomiting.  Musculoskeletal: Negative for joint swelling and myalgias.  Skin: Negative for color change and rash.  Neurological: Positive for headaches. Negative for dizziness and light-headedness.  Psychiatric/Behavioral: Negative for agitation and dysphoric mood.       Objective:    Physical Exam  Constitutional: She appears well-developed and well-nourished. No distress.  No pain to palpation over the temporal region.    HENT:  Nose: Nose normal.  Mouth/Throat: Oropharynx is clear and moist.  Neck: Neck supple.  Cardiovascular: Normal rate and regular rhythm.   Pulmonary/Chest: Breath sounds normal. No respiratory distress. She has no wheezes.  Abdominal: Soft. Bowel sounds are normal. There is no tenderness.  Musculoskeletal: She exhibits no edema or tenderness.  Lymphadenopathy:    She has no cervical adenopathy.  Skin: No rash noted. No erythema.  Psychiatric: She has a normal mood and affect. Her behavior is normal.    BP (!) 158/70   Pulse 72   Temp 98.1 F (36.7 C) (Oral)   Ht '5\' 11"'$  (1.803 m)   Wt 170 lb 3.2 oz (77.2 kg)   SpO2 98%   BMI 23.74 kg/m  Wt Readings from Last 3 Encounters:  07/13/16 170 lb 3.2 oz (77.2 kg)  07/07/16 169 lb 6.4 oz (76.8 kg)  05/31/16 169 lb 11.2 oz (77 kg)     Lab Results  Component Value Date   WBC 6.5 07/13/2016   HGB 14.2 07/13/2016   HCT 42.7 07/13/2016   PLT 175.0 07/13/2016   GLUCOSE 94 07/13/2016   CHOL 110 05/30/2016   TRIG 67.0 05/30/2016   HDL 41.60 05/30/2016   LDLCALC 55 05/30/2016   ALT 16 05/30/2016   AST 16 05/30/2016   NA 139 07/13/2016   K 3.9 07/13/2016   CL 100 07/13/2016   CREATININE 0.83 07/13/2016   BUN 16 07/13/2016   CO2 30 07/13/2016   TSH 2.65 05/30/2016    Mm Screening Breast Tomo Bilateral  Result Date: 05/12/2016 CLINICAL DATA:  Screening. EXAM: 2D DIGITAL SCREENING BILATERAL MAMMOGRAM  WITH CAD AND ADJUNCT TOMO COMPARISON:  Previous exam(s). ACR Breast Density Category c: The breast tissue is heterogeneously dense, which may obscure small masses. FINDINGS: There are no findings suspicious for malignancy. Images were processed with CAD. IMPRESSION: No mammographic evidence of malignancy. A result letter of this screening mammogram will be mailed directly to the patient. RECOMMENDATION: Screening mammogram in one year. (Code:SM-B-01Y) BI-RADS CATEGORY  1: Negative. Electronically Signed   By: Dorise Bullion III  M.D   On: 05/12/2016 12:12       Assessment & Plan:   Problem List Items Addressed This Visit    Essential hypertension    Blood pressure has been a little elevated.  Hold on making changes in medication.  Follow pressures.        Headache - Primary    Intermittent headache has persisted.  claritin and flonase did not help.  No sinus congestion or sinus pressure.  No pain to palpation, but will check esr to confirm normal.  No neurological deficits, but given persistent intermittent headache as described, will obtain MRI brain.  Blood pressure has been slightly elevated.  Will hold on additional medication.  Follow pressures.  MRI.  Further w/up pending results.  Pt comfortable with this plan.  Knows if symptoms change or worsen, she needs to be evaluated.        Relevant Orders   Sedimentation rate (Completed)   CBC with Differential/Platelet (Completed)   Basic metabolic panel (Completed)   MR Brain W Wo Contrast   Insomnia    Is sleeping.            Einar Pheasant, MD

## 2016-07-14 ENCOUNTER — Other Ambulatory Visit: Payer: Self-pay | Admitting: Internal Medicine

## 2016-07-14 NOTE — Progress Notes (Signed)
Order placed for f/u calcium.  

## 2016-07-16 ENCOUNTER — Encounter: Payer: Self-pay | Admitting: Internal Medicine

## 2016-07-16 NOTE — Assessment & Plan Note (Signed)
Intermittent headache has persisted.  claritin and flonase did not help.  No sinus congestion or sinus pressure.  No pain to palpation, but will check esr to confirm normal.  No neurological deficits, but given persistent intermittent headache as described, will obtain MRI brain.  Blood pressure has been slightly elevated.  Will hold on additional medication.  Follow pressures.  MRI.  Further w/up pending results.  Pt comfortable with this plan.  Knows if symptoms change or worsen, she needs to be evaluated.

## 2016-07-16 NOTE — Assessment & Plan Note (Signed)
Blood pressure has been a little elevated.  Hold on making changes in medication.  Follow pressures.

## 2016-07-16 NOTE — Assessment & Plan Note (Signed)
Is sleeping.

## 2016-07-18 ENCOUNTER — Ambulatory Visit
Admission: RE | Admit: 2016-07-18 | Discharge: 2016-07-18 | Disposition: A | Discharge status: Home / Self Care | Payer: PPO | Source: Ambulatory Visit | Attending: Internal Medicine | Admitting: Internal Medicine

## 2016-07-18 DIAGNOSIS — G44209 Tension-type headache, unspecified, not intractable: Secondary | ICD-10-CM | POA: Diagnosis not present

## 2016-07-18 DIAGNOSIS — H5711 Ocular pain, right eye: Secondary | ICD-10-CM | POA: Diagnosis not present

## 2016-07-18 MED ORDER — GADOBENATE DIMEGLUMINE 529 MG/ML IV SOLN
15.0000 mL | Freq: Once | INTRAVENOUS | Status: AC | PRN
  Administered 2016-07-18: 15 mL via INTRAVENOUS

## 2016-07-19 ENCOUNTER — Encounter: Payer: Self-pay | Admitting: Internal Medicine

## 2016-07-19 DIAGNOSIS — X32XXXA Exposure to sunlight, initial encounter: Secondary | ICD-10-CM | POA: Diagnosis not present

## 2016-07-19 DIAGNOSIS — L570 Actinic keratosis: Secondary | ICD-10-CM | POA: Diagnosis not present

## 2016-07-19 DIAGNOSIS — L814 Other melanin hyperpigmentation: Secondary | ICD-10-CM | POA: Diagnosis not present

## 2016-07-19 DIAGNOSIS — D1801 Hemangioma of skin and subcutaneous tissue: Secondary | ICD-10-CM | POA: Diagnosis not present

## 2016-07-19 DIAGNOSIS — L821 Other seborrheic keratosis: Secondary | ICD-10-CM | POA: Diagnosis not present

## 2016-07-19 DIAGNOSIS — Z85828 Personal history of other malignant neoplasm of skin: Secondary | ICD-10-CM | POA: Diagnosis not present

## 2016-07-21 ENCOUNTER — Other Ambulatory Visit: Payer: Self-pay | Admitting: Internal Medicine

## 2016-07-21 DIAGNOSIS — G44209 Tension-type headache, unspecified, not intractable: Secondary | ICD-10-CM

## 2016-07-21 NOTE — Progress Notes (Signed)
Order placed for neurology referral.   

## 2016-07-28 ENCOUNTER — Encounter: Payer: Self-pay | Admitting: Internal Medicine

## 2016-07-28 ENCOUNTER — Other Ambulatory Visit (INDEPENDENT_AMBULATORY_CARE_PROVIDER_SITE_OTHER): Payer: PPO

## 2016-07-28 LAB — CALCIUM: Calcium: 10.1 mg/dL (ref 8.4–10.5)

## 2016-08-02 DIAGNOSIS — R51 Headache: Secondary | ICD-10-CM | POA: Diagnosis not present

## 2016-08-04 ENCOUNTER — Encounter: Payer: Self-pay | Admitting: Internal Medicine

## 2016-08-23 ENCOUNTER — Ambulatory Visit (INDEPENDENT_AMBULATORY_CARE_PROVIDER_SITE_OTHER): Payer: PPO | Admitting: Internal Medicine

## 2016-08-23 ENCOUNTER — Encounter: Payer: Self-pay | Admitting: Internal Medicine

## 2016-08-23 DIAGNOSIS — G4700 Insomnia, unspecified: Secondary | ICD-10-CM

## 2016-08-23 DIAGNOSIS — I2510 Atherosclerotic heart disease of native coronary artery without angina pectoris: Secondary | ICD-10-CM

## 2016-08-23 DIAGNOSIS — E7800 Pure hypercholesterolemia, unspecified: Secondary | ICD-10-CM | POA: Diagnosis not present

## 2016-08-23 DIAGNOSIS — R87629 Unspecified abnormal cytological findings in specimens from vagina: Secondary | ICD-10-CM

## 2016-08-23 DIAGNOSIS — G44209 Tension-type headache, unspecified, not intractable: Secondary | ICD-10-CM

## 2016-08-23 DIAGNOSIS — I10 Essential (primary) hypertension: Secondary | ICD-10-CM

## 2016-08-23 NOTE — Progress Notes (Signed)
[name], female   DOB: 27-Nov-1950, 65 y.o.   MRN: [id number]   Subjective:    [name], female    DOB: 04/22/1951, 65 y.o.   MRN: [id number]  HPI  Patient here for a scheduled follow up.  She has been having issues with headaches.  See previous note for details.  Had MRI results reviewed.  No acute abnormality. Headache is better now.  Still with some issues, but overall improved.  Has her appt with neurology next week.  No vision change.  No chest pain.  No sob.  No acid reflux. No abdominal pain or cramping.  Bowels stable.  Sleeping with trazodone.     Past Medical History:  Diagnosis Date  . Acid reflux   . Coronary artery disease   . 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 Heart murmur   . History of chicken pox   . Hyperlipidemia   . Hypertension   . Insomnia    Past Surgical History:  Procedure Laterality Date  . APPENDECTOMY    . BREAST EXCISIONAL BIOPSY Right    negative over 5 years ago- neg  . BREAST SURGERY     Biopsy  . COLONOSCOPY WITH PROPOFOL N/A 05/16/2016   Procedure: COLONOSCOPY WITH PROPOFOL;  Surgeon: [name], MD;  Location: [facility] END[facility]COPY;  Service: Endoscopy;  Laterality: N/A;  . HYSTEROSCOPY W/D&C    . lipoma removed     removed from forehead  . triple bypass     Family History  Problem Relation Age of Onset  . Breast cancer      maternal great aunt  . Heart disease      multiple family members  . Heart disease Mother   . Heart disease Father   . Breast cancer Sister 58  . Colon cancer Neg Hx   . Diabetes Neg Hx   . Ovarian cancer Neg Hx    Social History   Social History  . Marital status: Married    Spouse name: N/A  . Number of children: N/A  . Years of education: N/A   Social History Main Topics  . Smoking status: Never Smoker  . Smokeless tobacco: Never Used  . Alcohol use 0.0 oz/week     Comment: socially  . Drug use: No  . Sexual activity: Yes    Birth control/ protection:  Post-menopausal   Other Topics Concern  . None   Social History Narrative  . None    Outpatient Encounter Prescriptions as of 08/23/2016  Medication Sig  . amLODipine (NORVASC) 10 MG tablet Take 10 mg by mouth daily.  Marland Kitchen aspirin EC 81 MG tablet Take 81 mg by mouth daily.  Marland Kitchen atorvastatin (LIPITOR) 20 MG tablet TAKE 1 TABLET (20 MG TOTAL) BY MOUTH ONCE DAILY.  Marland Kitchen Docusate Calcium (STOOL SOFTENER PO) Take by mouth.  . hydrochlorothiazide (HYDRODIURIL) 25 MG tablet TAKE 1 TABLET (25 MG TOTAL) BY MOUTH ONCE DAILY.  . metoprolol tartrate (LOPRESSOR) 25 MG tablet Take 25 mg by mouth 2 (two) times daily.  . Omega-3 Fatty Acids (FISH OIL) 1000 MG CAPS Take 1,200 mg by mouth daily.  . Probiotic Product (PROBIOTIC DAILY PO) Take by mouth.  . telmisartan (MICARDIS) 80 MG tablet TAKE 1 TABLET (80 MG TOTAL) BY MOUTH ONCE DAILY.  . traZODone (DESYREL) 50 MG tablet Take 1-2 tablets (50-100 mg total) by mouth at bedtime as needed. (Patient taking differently: Take 50-100 mg by mouth  at bedtime. )  . VITAMIN D, ERGOCALCIFEROL, PO Take by mouth.   No facility-administered encounter medications on file as of 08/23/2016.     Review of Systems  Constitutional: Negative for appetite change and unexpected weight change.  HENT: Negative for congestion and sinus pressure.   Respiratory: Negative for cough, chest tightness and shortness of breath.   Cardiovascular: Negative for chest pain, palpitations and leg swelling.  Gastrointestinal: Negative for abdominal pain, diarrhea, nausea and vomiting.  Genitourinary: Negative for difficulty urinating and dysuria.  Musculoskeletal: Negative for joint swelling and myalgias.  Skin: Negative for color change and rash.  Neurological: Positive for headaches. Negative for dizziness and light-headedness.  Psychiatric/Behavioral: Negative for agitation and dysphoric mood.       Objective:    Physical Exam  Constitutional: She appears well-developed and  well-nourished. No distress.  HENT:  Nose: Nose normal.  Mouth/Throat: Oropharynx is clear and moist.  Neck: Neck supple. No thyromegaly present.  Cardiovascular: Normal rate and regular rhythm.   Pulmonary/Chest: Breath sounds normal. No respiratory distress. She has no wheezes.  Abdominal: Soft. Bowel sounds are normal. There is no tenderness.  Musculoskeletal: She exhibits no edema or tenderness.  Lymphadenopathy:    She has no cervical adenopathy.  Skin: No rash noted. No erythema.  Psychiatric: She has a normal mood and affect. Her behavior is normal.    BP (!) 148/78   Pulse 64   Wt 172 lb (78 kg)   SpO2 97%   BMI 23.99 kg/m  Wt Readings from Last 3 Encounters:  08/23/16 172 lb (78 kg)  07/13/16 170 lb 3.2 oz (77.2 kg)  07/07/16 169 lb 6.4 oz (76.8 kg)     Lab Results  Component Value Date   WBC 6.5 07/13/2016   HGB 14.2 07/13/2016   HCT 42.7 07/13/2016   PLT 175.0 07/13/2016   GLUCOSE 94 07/13/2016   CHOL 110 05/30/2016   TRIG 67.0 05/30/2016   HDL 41.60 05/30/2016   LDLCALC 55 05/30/2016   ALT 16 05/30/2016   AST 16 05/30/2016   NA 139 07/13/2016   K 3.9 07/13/2016   CL 100 07/13/2016   CREATININE 0.83 07/13/2016   BUN 16 07/13/2016   CO2 30 07/13/2016   TSH 2.65 05/30/2016    Mr Brain W IR Contrast  Result Date: 07/18/2016 CLINICAL DATA:  65 year old hypertensive female with dull headaches off and on for 5-6 weeks. Pain around and behind right eye. Initial encounter. EXAM: MRI HEAD WITHOUT AND WITH CONTRAST TECHNIQUE: Multiplanar, multiecho pulse sequences of the brain and surrounding structures were obtained without and with intravenous contrast. CONTRAST:  48m MULTIHANCE GADOBENATE DIMEGLUMINE 529 MG/ML IV SOLN COMPARISON:  None. FINDINGS: Brain: No acute infarct or intracranial hemorrhage. Very mild chronic microvascular changes. No age advanced atrophy or hydrocephalus. No intracranial mass or abnormal enhancement. Vascular: Major intracranial  vascular structures are patent. Skull and upper cervical spine: Negative. Sinuses/Orbits: No acute orbital abnormality. Minimal mucosal thickening ethmoid sinus air cells. Other: Negative IMPRESSION: No acute intracranial abnormality. Very mild chronic microvascular changes. Electronically Signed   By: SGenia DelM.D.   On: 07/18/2016 16:06       Assessment & Plan:   Problem List Items Addressed This Visit    Abnormal vaginal Pap smear    Followed by Dr DEnzo Bi        CAD (coronary artery disease)    S/p triple bypass.  Followed by cardiology.  Stable.  Continue risk factor modification.  Essential hypertension    On recheck improved.  Have her continue her current medication regimen.  Follow pressures.  Follow metabolic panel.        Relevant Orders   Basic metabolic panel   Headache    Persistent.  MRI reviewed and discussed with pt.  Is better.  ESR wnl.  Due to see neurology next week.  Hold on making changes or adding medication.  Follow.        Hypercholesterolemia    On lipitor.  Low cholesterol diet and exercise.  Follow lipid panel and liver function tests.        Relevant Orders   Hepatic function panel   Lipid panel   Insomnia    Sleeping better with trazodone.  Continue.  Follow.            Einar Pheasant, MD

## 2016-08-30 DIAGNOSIS — G44099 Other trigeminal autonomic cephalgias (TAC), not intractable: Secondary | ICD-10-CM | POA: Diagnosis not present

## 2016-08-30 DIAGNOSIS — I999 Unspecified disorder of circulatory system: Secondary | ICD-10-CM | POA: Diagnosis not present

## 2016-09-06 ENCOUNTER — Encounter: Payer: Self-pay | Admitting: Internal Medicine

## 2016-09-06 NOTE — Assessment & Plan Note (Signed)
S/p triple bypass.  Followed by cardiology.  Stable.  Continue risk factor modification.

## 2016-09-06 NOTE — Assessment & Plan Note (Signed)
Persistent.  MRI reviewed and discussed with pt.  Is better.  ESR wnl.  Due to see neurology next week.  Hold on making changes or adding medication.  Follow.

## 2016-09-06 NOTE — Assessment & Plan Note (Signed)
On lipitor.  Low cholesterol diet and exercise.  Follow lipid panel and liver function tests.   

## 2016-09-06 NOTE — Assessment & Plan Note (Signed)
Followed by Dr Enzo Bi.

## 2016-09-06 NOTE — Assessment & Plan Note (Signed)
On recheck improved.  Have her continue her current medication regimen.  Follow pressures.  Follow metabolic panel.

## 2016-09-06 NOTE — Assessment & Plan Note (Signed)
Sleeping better with trazodone.  Continue.  Follow.

## 2016-09-14 DIAGNOSIS — M3501 Sicca syndrome with keratoconjunctivitis: Secondary | ICD-10-CM | POA: Diagnosis not present

## 2016-10-02 ENCOUNTER — Other Ambulatory Visit: Payer: Self-pay | Admitting: Internal Medicine

## 2016-10-02 NOTE — Telephone Encounter (Signed)
Refilled 03/15/16. Pt last seen 08/23/16. Please advise?

## 2016-10-24 ENCOUNTER — Other Ambulatory Visit (INDEPENDENT_AMBULATORY_CARE_PROVIDER_SITE_OTHER): Payer: PPO

## 2016-10-24 DIAGNOSIS — I10 Essential (primary) hypertension: Secondary | ICD-10-CM

## 2016-10-24 DIAGNOSIS — E7800 Pure hypercholesterolemia, unspecified: Secondary | ICD-10-CM | POA: Diagnosis not present

## 2016-10-24 LAB — LIPID PANEL
CHOLESTEROL: 113 mg/dL (ref 0–200)
HDL: 45.6 mg/dL (ref >39.00)
LDL Cholesterol: 52 mg/dL (ref 0–99)
NonHDL: 67.11
Total CHOL/HDL Ratio: 2
Triglycerides: 77 mg/dL (ref 0.0–149.0)
VLDL: 15.4 mg/dL (ref 0.0–40.0)

## 2016-10-24 LAB — BASIC METABOLIC PANEL
BUN: 16 mg/dL (ref 6–23)
CO2: 33 mEq/L — ABNORMAL HIGH (ref 19–32)
Calcium: 10.2 mg/dL (ref 8.4–10.5)
Chloride: 105 mEq/L (ref 96–112)
Creatinine, Ser: 0.84 mg/dL (ref 0.40–1.20)
GFR: 72.23 mL/min (ref >60.00)
GLUCOSE: 85 mg/dL (ref 70–99)
POTASSIUM: 3.9 meq/L (ref 3.5–5.1)
SODIUM: 142 meq/L (ref 135–145)

## 2016-10-24 LAB — HEPATIC FUNCTION PANEL
ALT: 21 U/L (ref 0–35)
AST: 18 U/L (ref 0–37)
Albumin: 4.4 g/dL (ref 3.5–5.2)
Alkaline Phosphatase: 56 U/L (ref 39–117)
Bilirubin, Direct: 0.1 mg/dL (ref 0.0–0.3)
TOTAL PROTEIN: 6.7 g/dL (ref 6.0–8.3)
Total Bilirubin: 0.6 mg/dL (ref 0.2–1.2)

## 2016-10-25 ENCOUNTER — Encounter: Payer: Self-pay | Admitting: Internal Medicine

## 2016-10-26 ENCOUNTER — Ambulatory Visit: Payer: PPO | Admitting: Internal Medicine

## 2016-10-30 ENCOUNTER — Ambulatory Visit (INDEPENDENT_AMBULATORY_CARE_PROVIDER_SITE_OTHER): Payer: PPO | Admitting: Internal Medicine

## 2016-10-30 ENCOUNTER — Encounter: Payer: Self-pay | Admitting: Internal Medicine

## 2016-10-30 VITALS — BP 144/78 | HR 57 | Temp 98.6°F | Ht 71.0 in | Wt 169.8 lb

## 2016-10-30 DIAGNOSIS — I10 Essential (primary) hypertension: Secondary | ICD-10-CM

## 2016-10-30 DIAGNOSIS — I2510 Atherosclerotic heart disease of native coronary artery without angina pectoris: Secondary | ICD-10-CM | POA: Diagnosis not present

## 2016-10-30 DIAGNOSIS — G44209 Tension-type headache, unspecified, not intractable: Secondary | ICD-10-CM

## 2016-10-30 DIAGNOSIS — E7800 Pure hypercholesterolemia, unspecified: Secondary | ICD-10-CM | POA: Diagnosis not present

## 2016-10-30 DIAGNOSIS — H029 Unspecified disorder of eyelid: Secondary | ICD-10-CM

## 2016-10-30 DIAGNOSIS — G4700 Insomnia, unspecified: Secondary | ICD-10-CM

## 2016-10-30 NOTE — Progress Notes (Signed)
[name], female   DOB: September 09, 1951, 66 y.o.   MRN: [id number]   Subjective:    [name], female    DOB: Dec 15, 1950, 66 y.o.   MRN: [id number]  HPI  Patient here for a scheduled follow up.  Was having persistent headaches.  See previous note for details.  Saw neurology.  Note reviewed.  Diagnosed with trigeminal autonomic cephalgia type of headache.  Headache is better.  Not completely resolved, but better.  No dizziness.  Tries to stay active.  No chest pain.  No sob.  No acid reflux reported.  Persistent lesion left lower eye lid.  Used warm compresses.  Still  Present.     Past Medical History:  Diagnosis Date  . Acid reflux   . Coronary artery disease   . 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 Heart murmur   . History of chicken pox   . Hyperlipidemia   . Hypertension   . Insomnia    Past Surgical History:  Procedure Laterality Date  . APPENDECTOMY    . BREAST EXCISIONAL BIOPSY Right    negative over 5 years ago- neg  . BREAST SURGERY     Biopsy  . COLONOSCOPY WITH PROPOFOL N/A 05/16/2016   Procedure: COLONOSCOPY WITH PROPOFOL;  Surgeon: [name], MD;  Location: [facility] END[facility];  Service: Endoscopy;  Laterality: N/A;  . HYSTEROSCOPY W/D&C    . lipoma removed     removed from forehead  . triple bypass     Family History  Problem Relation Age of Onset  . Breast cancer      maternal great aunt  . Heart disease      multiple family members  . Heart disease Mother   . Heart disease Father   . Breast cancer Sister 51  . Colon cancer Neg Hx   . Diabetes Neg Hx   . Ovarian cancer Neg Hx    Social History   Social History  . Marital status: Married    Spouse name: N/A  . Number of children: N/A  . Years of education: N/A   Social History Main Topics  . Smoking status: Never Smoker  . Smokeless tobacco: Never Used  . Alcohol use 0.0 oz/week     Comment: socially  . Drug use: No  . Sexual activity: Yes    Birth  control/ protection: Post-menopausal   Other Topics Concern  . None   Social History Narrative  . None    Outpatient Encounter Prescriptions as of 10/30/2016  Medication Sig  . amLODipine (NORVASC) 10 MG tablet Take 10 mg by mouth daily.  Marland Kitchen aspirin EC 81 MG tablet Take 81 mg by mouth daily.  Marland Kitchen atorvastatin (LIPITOR) 20 MG tablet TAKE 1 TABLET (20 MG TOTAL) BY MOUTH ONCE DAILY.  Marland Kitchen Docusate Calcium (STOOL SOFTENER PO) Take by mouth.  . hydrochlorothiazide (HYDRODIURIL) 25 MG tablet TAKE 1 TABLET (25 MG TOTAL) BY MOUTH ONCE DAILY.  . metoprolol tartrate (LOPRESSOR) 25 MG tablet Take 25 mg by mouth 2 (two) times daily.  . Omega-3 Fatty Acids (FISH OIL) 1000 MG CAPS Take 1,200 mg by mouth daily.  . Probiotic Product (PROBIOTIC DAILY PO) Take by mouth.  . telmisartan (MICARDIS) 80 MG tablet TAKE 1 TABLET (80 MG TOTAL) BY MOUTH ONCE DAILY.  . traZODone (DESYREL) 50 MG tablet TAKE 1-2 TABLETS (50-100 MG TOTAL) BY MOUTH AT BEDTIME AS NEEDED.  Marland Kitchen VITAMIN D, ERGOCALCIFEROL, PO Take  by mouth.   No facility-administered encounter medications on file as of 10/30/2016.     Review of Systems  Constitutional: Negative for appetite change and unexpected weight change.  HENT: Negative for congestion and sinus pressure.   Respiratory: Negative for cough, chest tightness and shortness of breath.   Cardiovascular: Negative for chest pain, palpitations and leg swelling.  Gastrointestinal: Negative for abdominal pain, diarrhea, nausea and vomiting.  Genitourinary: Negative for difficulty urinating and dysuria.  Musculoskeletal: Negative for back pain and joint swelling.  Skin: Negative for color change and rash.  Neurological: Negative for dizziness and light-headedness.       Headaches better.   Psychiatric/Behavioral: Positive for sleep disturbance. Negative for agitation and dysphoric mood.       Objective:    Physical Exam  Constitutional: She appears well-developed and well-nourished. No  distress.  HENT:  Nose: Nose normal.  Mouth/Throat: Oropharynx is clear and moist.  Neck: Neck supple. No thyromegaly present.  Cardiovascular: Normal rate and regular rhythm.   Pulmonary/Chest: Breath sounds normal. No respiratory distress. She has no wheezes.  Abdominal: Soft. Bowel sounds are normal. There is no tenderness.  Musculoskeletal: She exhibits no edema or tenderness.  Lymphadenopathy:    She has no cervical adenopathy.  Skin: No rash noted. No erythema.  Psychiatric: She has a normal mood and affect. Her behavior is normal.    BP (!) 144/78 (BP Location: Left Arm, Patient Position: Sitting, Cuff Size: Large)   Pulse (!) 57   Temp 98.6 F (37 C) (Oral)   Ht 5\' 11"  (1.803 m)   Wt 169 lb 12.8 oz (77 kg)   SpO2 98%   BMI 23.68 kg/m  Wt Readings from Last 3 Encounters:  10/30/16 169 lb 12.8 oz (77 kg)  08/23/16 172 lb (78 kg)  07/13/16 170 lb 3.2 oz (77.2 kg)     Lab Results  Component Value Date   WBC 6.5 07/13/2016   HGB 14.2 07/13/2016   HCT 42.7 07/13/2016   PLT 175.0 07/13/2016   GLUCOSE 85 10/24/2016   CHOL 113 10/24/2016   TRIG 77.0 10/24/2016   HDL 45.60 10/24/2016   LDLCALC 52 10/24/2016   ALT 21 10/24/2016   AST 18 10/24/2016   NA 142 10/24/2016   K 3.9 10/24/2016   CL 105 10/24/2016   CREATININE 0.84 10/24/2016   BUN 16 10/24/2016   CO2 33 (H) 10/24/2016   TSH 2.65 05/30/2016    Mr Brain W [id number] Contrast  Result Date: 07/18/2016 CLINICAL DATA:  66 year old hypertensive female with dull headaches off and on for 5-6 weeks. Pain around and behind right eye. Initial encounter. EXAM: MRI HEAD WITHOUT AND WITH CONTRAST TECHNIQUE: Multiplanar, multiecho pulse sequences of the brain and surrounding structures were obtained without and with intravenous contrast. CONTRAST:  55mL MULTIHANCE GADOBENATE DIMEGLUMINE 529 MG/ML IV SOLN COMPARISON:  None. FINDINGS: Brain: No acute infarct or intracranial hemorrhage. Very mild chronic microvascular changes. No  age advanced atrophy or hydrocephalus. No intracranial mass or abnormal enhancement. Vascular: Major intracranial vascular structures are patent. Skull and upper cervical spine: Negative. Sinuses/Orbits: No acute orbital abnormality. Minimal mucosal thickening ethmoid sinus air cells. Other: Negative IMPRESSION: No acute intracranial abnormality. Very mild chronic microvascular changes. Electronically Signed   By: Genia Del M.D.   On: 07/18/2016 16:06       Assessment & Plan:   Problem List Items Addressed This Visit    CAD (coronary artery disease)    Continue risk factor modification.  Currently asymptomatic.  Continue f/u with cardiology.        Essential hypertension    Blood pressure on recheck better.  Follow pressures.  Continue current medication regimen.        Relevant Orders   Basic metabolic panel   Headache    Saw neurology as outlined.  Better.  Follow.        Hypercholesterolemia    On lipitor.  Low cholesterol diet and exercise.  Follow lipid panel and liver function tests.        Relevant Orders   Hepatic function panel   Lipid panel   Insomnia    She does feel trazodone helps.  Feels may need to increase the dose some.  Increase trazodone to 100mg  q day.  Follow.         Other Visit Diagnoses    Eyelid lesion    -  Primary   persistent.  f/u with opthalmology.  has tried warm compresses.         Einar Pheasant, MD

## 2016-10-30 NOTE — Progress Notes (Signed)
Pre-visit discussion using our clinic review tool. No additional management support is needed unless otherwise documented below in the visit note.  

## 2016-11-12 ENCOUNTER — Encounter: Payer: Self-pay | Admitting: Internal Medicine

## 2016-11-12 NOTE — Assessment & Plan Note (Signed)
She does feel trazodone helps.  Feels may need to increase the dose some.  Increase trazodone to 100mg  q day.  Follow.

## 2016-11-12 NOTE — Assessment & Plan Note (Signed)
On lipitor.  Low cholesterol diet and exercise.  Follow lipid panel and liver function tests.   

## 2016-11-12 NOTE — Assessment & Plan Note (Signed)
Saw neurology as outlined.  Better.  Follow.

## 2016-11-12 NOTE — Assessment & Plan Note (Signed)
Continue risk factor modification.  Currently asymptomatic.  Continue f/u with cardiology.   

## 2016-11-12 NOTE — Assessment & Plan Note (Signed)
Blood pressure on recheck better.  Follow pressures.  Continue current medication regimen.

## 2016-11-23 ENCOUNTER — Telehealth: Payer: Self-pay | Admitting: Internal Medicine

## 2016-11-23 NOTE — Telephone Encounter (Signed)
Left pt message asking to call Allison back directly at [phone] to schedule AWV. Thanks! °

## 2016-11-28 DIAGNOSIS — I341 Nonrheumatic mitral (valve) prolapse: Secondary | ICD-10-CM | POA: Diagnosis not present

## 2016-11-28 DIAGNOSIS — R002 Palpitations: Secondary | ICD-10-CM | POA: Insufficient documentation

## 2016-11-28 DIAGNOSIS — I10 Essential (primary) hypertension: Secondary | ICD-10-CM | POA: Diagnosis not present

## 2016-11-28 DIAGNOSIS — I25810 Atherosclerosis of coronary artery bypass graft(s) without angina pectoris: Secondary | ICD-10-CM | POA: Diagnosis not present

## 2016-11-28 DIAGNOSIS — E782 Mixed hyperlipidemia: Secondary | ICD-10-CM | POA: Diagnosis not present

## 2016-12-18 DIAGNOSIS — E785 Hyperlipidemia, unspecified: Secondary | ICD-10-CM | POA: Diagnosis not present

## 2016-12-18 DIAGNOSIS — R0789 Other chest pain: Secondary | ICD-10-CM | POA: Diagnosis not present

## 2016-12-18 DIAGNOSIS — I10 Essential (primary) hypertension: Secondary | ICD-10-CM | POA: Diagnosis not present

## 2016-12-18 DIAGNOSIS — I2510 Atherosclerotic heart disease of native coronary artery without angina pectoris: Secondary | ICD-10-CM | POA: Diagnosis not present

## 2016-12-18 DIAGNOSIS — J449 Chronic obstructive pulmonary disease, unspecified: Secondary | ICD-10-CM | POA: Diagnosis not present

## 2016-12-18 DIAGNOSIS — Z7982 Long term (current) use of aspirin: Secondary | ICD-10-CM | POA: Diagnosis not present

## 2016-12-18 DIAGNOSIS — Z951 Presence of aortocoronary bypass graft: Secondary | ICD-10-CM | POA: Diagnosis not present

## 2016-12-18 DIAGNOSIS — R079 Chest pain, unspecified: Secondary | ICD-10-CM | POA: Diagnosis not present

## 2016-12-18 DIAGNOSIS — Z87891 Personal history of nicotine dependence: Secondary | ICD-10-CM | POA: Diagnosis not present

## 2016-12-19 DIAGNOSIS — R079 Chest pain, unspecified: Secondary | ICD-10-CM | POA: Diagnosis not present

## 2016-12-19 DIAGNOSIS — E785 Hyperlipidemia, unspecified: Secondary | ICD-10-CM | POA: Diagnosis not present

## 2016-12-19 DIAGNOSIS — R072 Precordial pain: Secondary | ICD-10-CM | POA: Diagnosis not present

## 2016-12-19 DIAGNOSIS — I10 Essential (primary) hypertension: Secondary | ICD-10-CM | POA: Diagnosis not present

## 2016-12-28 ENCOUNTER — Telehealth: Payer: Self-pay | Admitting: Gastroenterology

## 2016-12-28 NOTE — Telephone Encounter (Signed)
Patient left a voice message that she is returning your call °

## 2016-12-31 ENCOUNTER — Other Ambulatory Visit: Payer: Self-pay | Admitting: Internal Medicine

## 2017-01-02 ENCOUNTER — Ambulatory Visit (INDEPENDENT_AMBULATORY_CARE_PROVIDER_SITE_OTHER): Payer: PPO | Admitting: Internal Medicine

## 2017-01-02 ENCOUNTER — Encounter: Payer: Self-pay | Admitting: Internal Medicine

## 2017-01-02 VITALS — BP 142/62 | HR 62 | Temp 98.7°F | Resp 12 | Ht 71.0 in | Wt 169.8 lb

## 2017-01-02 DIAGNOSIS — R1013 Epigastric pain: Secondary | ICD-10-CM

## 2017-01-02 DIAGNOSIS — K2100 Gastro-esophageal reflux disease with esophagitis, without bleeding: Secondary | ICD-10-CM

## 2017-01-02 DIAGNOSIS — E7800 Pure hypercholesterolemia, unspecified: Secondary | ICD-10-CM

## 2017-01-02 DIAGNOSIS — I2510 Atherosclerotic heart disease of native coronary artery without angina pectoris: Secondary | ICD-10-CM

## 2017-01-02 DIAGNOSIS — R079 Chest pain, unspecified: Secondary | ICD-10-CM | POA: Diagnosis not present

## 2017-01-02 DIAGNOSIS — I10 Essential (primary) hypertension: Secondary | ICD-10-CM

## 2017-01-02 MED ORDER — PANTOPRAZOLE SODIUM 40 MG PO TBEC: 40.0000 mg | DELAYED_RELEASE_TABLET | Freq: Every day | ORAL | 2 refills | Status: DC

## 2017-01-02 NOTE — Progress Notes (Signed)
[name], female   DOB: [birth year], 66 y.o.   MRN: [id number]   Subjective:    [name], female    DOB: 05/01/1951, 66 y.o.   MRN: [id number]  HPI  Patient here for a scheduled follow up and to follow up on a recent ER visit.  She was at [facility].  Has a known history of heart disease.  Has noticed over the last 2-3 weeks, intermittent chest discomfort.  Describes a burning sensation.  Also some dull discomfort.  Noticed a burning in her right shoulder and under her breast.  [place]  Noticed more.  To [facility] ER.  Admitted and had nuclear stress test - negative.  Still having some "indigestion" type symptoms.  No nausea or vomiting.  No abdominal pain.  Bowels moving.  No specific triggers.  She did change her protonix.  States may have gotten worse with the change.  Has made an appt to see Dr Allen Norris next week.  No pain currently.    Past Medical History:  Diagnosis Date  . Acid reflux   . Coronary artery disease   . 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 Heart murmur   . History of chicken pox   . Hyperlipidemia   . Hypertension   . Insomnia    Past Surgical History:  Procedure Laterality Date  . APPENDECTOMY    . BREAST EXCISIONAL BIOPSY Right    negative over 5 years ago- neg  . BREAST SURGERY     Biopsy  . COLONOSCOPY WITH PROPOFOL N/A 05/16/2016   Procedure: COLONOSCOPY WITH PROPOFOL;  Surgeon: [name], MD;  Location: [facility] ENDOSCOPY;  Service: Endoscopy;  Laterality: N/A;  . HYSTEROSCOPY W/D&C    . lipoma removed     removed from forehead  . triple bypass     Family History  Problem Relation Age of Onset  . Breast cancer      maternal great aunt  . Heart disease      multiple family members  . Heart disease Mother   . Heart disease Father   . Breast cancer Sister 31  . Colon cancer Neg Hx   . Diabetes Neg Hx   . Ovarian cancer Neg Hx    Social History   Social History  . Marital status: Married   Spouse name: N/A  . Number of children: N/A  . Years of education: N/A   Social History Main Topics  . Smoking status: Never Smoker  . Smokeless tobacco: Never Used  . Alcohol use 0.0 oz/week     Comment: socially  . Drug use: No  . Sexual activity: Yes    Birth control/ protection: Post-menopausal   Other Topics Concern  . None   Social History Narrative  . None    Outpatient Encounter Prescriptions as of 01/02/2017  Medication Sig  . amLODipine (NORVASC) 10 MG tablet Take 10 mg by mouth daily.  Marland Kitchen aspirin EC 81 MG tablet Take 81 mg by mouth daily.  Marland Kitchen atorvastatin (LIPITOR) 20 MG tablet TAKE 1 TABLET (20 MG TOTAL) BY MOUTH ONCE DAILY.  Marland Kitchen Docusate Calcium (STOOL SOFTENER PO) Take by mouth.  . hydrochlorothiazide (HYDRODIURIL) 25 MG tablet TAKE 1 TABLET (25 MG TOTAL) BY MOUTH ONCE DAILY.  . metoprolol tartrate (LOPRESSOR) 25 MG tablet Take 25 mg by mouth 2 (two) times daily.  . Omega-3 Fatty Acids (FISH OIL) 1000 MG CAPS Take 1,200  mg by mouth daily.  . Probiotic Product (PROBIOTIC DAILY PO) Take by mouth.  . telmisartan (MICARDIS) 80 MG tablet TAKE 1 TABLET (80 MG TOTAL) BY MOUTH ONCE DAILY.  . traZODone (DESYREL) 50 MG tablet TAKE 1-2 TABLETS (50-100 MG TOTAL) BY MOUTH AT BEDTIME AS NEEDED.  Marland Kitchen VITAMIN D, ERGOCALCIFEROL, PO Take by mouth.  . pantoprazole (PROTONIX) 40 MG tablet Take 1 tablet (40 mg total) by mouth daily. Take 30 minutes before breakfast   No facility-administered encounter medications on file as of 01/02/2017.     Review of Systems  Constitutional: Negative for appetite change and unexpected weight change.  HENT: Negative for congestion and sinus pressure.   Respiratory: Negative for cough, chest tightness, shortness of breath and wheezing.   Cardiovascular: Negative for palpitations.       Some chest discomfort as outlined.    Gastrointestinal: Negative for abdominal pain, diarrhea, nausea and vomiting.  Genitourinary: Negative for difficulty urinating and  dysuria.  Musculoskeletal: Negative for back pain and joint swelling.  Skin: Negative for color change and rash.  Neurological: Negative for dizziness, light-headedness and headaches.  Psychiatric/Behavioral: Negative for agitation and dysphoric mood.       Objective:    Physical Exam  Constitutional: She appears well-developed and well-nourished. No distress.  HENT:  Nose: Nose normal.  Mouth/Throat: Oropharynx is clear and moist.  Neck: Neck supple. No thyromegaly present.  Cardiovascular: Normal rate and regular rhythm.   Pulmonary/Chest: Breath sounds normal. No respiratory distress. She has no wheezes.  Abdominal: Soft. Bowel sounds are normal. There is no tenderness.  Musculoskeletal: She exhibits no edema or tenderness.  Lymphadenopathy:    She has no cervical adenopathy.  Skin: No rash noted. No erythema.  Psychiatric: She has a normal mood and affect. Her behavior is normal.    BP (!) 142/62 (BP Location: Left Arm, Patient Position: Sitting, Cuff Size: Normal)   Pulse 62   Temp 98.7 F (37.1 C) (Oral)   Resp 12   Ht 5\' 11"  (1.803 m)   Wt 169 lb 12.8 oz (77 kg)   SpO2 98%   BMI 23.68 kg/m  Wt Readings from Last 3 Encounters:  01/02/17 169 lb 12.8 oz (77 kg)  10/30/16 169 lb 12.8 oz (77 kg)  08/23/16 172 lb (78 kg)     Lab Results  Component Value Date   WBC 6.5 07/13/2016   HGB 14.2 07/13/2016   HCT 42.7 07/13/2016   PLT 175.0 07/13/2016   GLUCOSE 85 10/24/2016   CHOL 113 10/24/2016   TRIG 77.0 10/24/2016   HDL 45.60 10/24/2016   LDLCALC 52 10/24/2016   ALT 21 10/24/2016   AST 18 10/24/2016   NA 142 10/24/2016   K 3.9 10/24/2016   CL 105 10/24/2016   CREATININE 0.84 10/24/2016   BUN 16 10/24/2016   CO2 33 (H) 10/24/2016   TSH 2.65 05/30/2016    Mr Brain W EU Contrast  Result Date: 07/18/2016 CLINICAL DATA:  66 year old hypertensive female with dull headaches off and on for 5-6 weeks. Pain around and behind right eye. Initial encounter. EXAM:  MRI HEAD WITHOUT AND WITH CONTRAST TECHNIQUE: Multiplanar, multiecho pulse sequences of the brain and surrounding structures were obtained without and with intravenous contrast. CONTRAST:  26mL MULTIHANCE GADOBENATE DIMEGLUMINE 529 MG/ML IV SOLN COMPARISON:  None. FINDINGS: Brain: No acute infarct or intracranial hemorrhage. Very mild chronic microvascular changes. No age advanced atrophy or hydrocephalus. No intracranial mass or abnormal enhancement. Vascular: Major intracranial vascular  structures are patent. Skull and upper cervical spine: Negative. Sinuses/Orbits: No acute orbital abnormality. Minimal mucosal thickening ethmoid sinus air cells. Other: Negative IMPRESSION: No acute intracranial abnormality. Very mild chronic microvascular changes. Electronically Signed   By: Genia Del M.D.   On: 07/18/2016 16:06       Assessment & Plan:   Problem List Items Addressed This Visit    CAD (coronary artery disease)    Known disease.  Sees Dr Nehemiah Massed.  Just had stress test at [facility] ER.  Negative.  Will f/u with cardiology this week to confirm no further cardiac w/up warranted.  Pain possibly from GI origin as outlined.        Chest pain - Primary    Chest pain as outlined.  Admitted at the [facility].  Nuclear stress test reportedly negative.  She will f/u with cardiology this week to confirm no further cardiac w/up warranted.  Discussed possible GI origin. Take protonix regularly.  Also need to r/o gallbladder disease.  Intermittent episodes - right side up to shoulder.  Obtain abdominal ultrasound.  Further w/up pending results.  Currently pain free.        Relevant Orders   US Abdomen Complete (Completed)   Esophagitis, reflux    Will have her take protonix regularly.  See if symptoms improve.  She has GI appt already scheduled with Dr Allen Norris next week.  Check abdominal ultrasound as oulinted.        Essential hypertension    Have her spot check her pressure.  Same  medication regimen.  Follow.        Hypercholesterolemia    Continue lipitor.  Follow lipid panel and liver function tests.         Other Visit Diagnoses    Epigastric pain       Relevant Orders   US Abdomen Complete (Completed)       Einar Pheasant, MD

## 2017-01-02 NOTE — Progress Notes (Signed)
Pre-visit discussion using our clinic review tool. No additional management support is needed unless otherwise documented below in the visit note.  

## 2017-01-03 NOTE — Telephone Encounter (Signed)
No call was made to pt from our office. She has not been in our office since 05/2016 and it was only for a colonoscopy. Her repeat is 10 years.

## 2017-01-04 ENCOUNTER — Ambulatory Visit
Admission: RE | Admit: 2017-01-04 | Discharge: 2017-01-04 | Disposition: A | Discharge status: Home / Self Care | Payer: PPO | Source: Ambulatory Visit | Attending: Internal Medicine | Admitting: Internal Medicine

## 2017-01-04 DIAGNOSIS — R079 Chest pain, unspecified: Secondary | ICD-10-CM | POA: Insufficient documentation

## 2017-01-04 DIAGNOSIS — K8020 Calculus of gallbladder without cholecystitis without obstruction: Secondary | ICD-10-CM | POA: Diagnosis not present

## 2017-01-04 DIAGNOSIS — E782 Mixed hyperlipidemia: Secondary | ICD-10-CM | POA: Diagnosis not present

## 2017-01-04 DIAGNOSIS — R1013 Epigastric pain: Secondary | ICD-10-CM | POA: Diagnosis not present

## 2017-01-04 DIAGNOSIS — D1779 Benign lipomatous neoplasm of other sites: Secondary | ICD-10-CM | POA: Diagnosis not present

## 2017-01-04 DIAGNOSIS — I10 Essential (primary) hypertension: Secondary | ICD-10-CM | POA: Diagnosis not present

## 2017-01-04 DIAGNOSIS — I25810 Atherosclerosis of coronary artery bypass graft(s) without angina pectoris: Secondary | ICD-10-CM | POA: Diagnosis not present

## 2017-01-07 ENCOUNTER — Encounter: Payer: Self-pay | Admitting: Internal Medicine

## 2017-01-07 DIAGNOSIS — K8020 Calculus of gallbladder without cholecystitis without obstruction: Secondary | ICD-10-CM

## 2017-01-08 ENCOUNTER — Encounter: Payer: Self-pay | Admitting: Internal Medicine

## 2017-01-08 DIAGNOSIS — R079 Chest pain, unspecified: Secondary | ICD-10-CM | POA: Insufficient documentation

## 2017-01-08 NOTE — Telephone Encounter (Signed)
Order placed for surgery referral.  

## 2017-01-08 NOTE — Assessment & Plan Note (Signed)
Continue lipitor.  Follow lipid panel and liver function tests.   

## 2017-01-08 NOTE — Assessment & Plan Note (Signed)
Have her spot check her pressure.  Same medication regimen.  Follow.

## 2017-01-08 NOTE — Assessment & Plan Note (Signed)
Known disease.  Sees Dr Nehemiah Massed.  Just had stress test at [facility] ER.  Negative.  Will f/u with cardiology this week to confirm no further cardiac w/up warranted.  Pain possibly from GI origin as outlined.

## 2017-01-08 NOTE — Assessment & Plan Note (Signed)
Chest pain as outlined.  Admitted at the [facility].  Nuclear stress test reportedly negative.  She will f/u with cardiology this week to confirm no further cardiac w/up warranted.  Discussed possible GI origin. Take protonix regularly.  Also need to r/o gallbladder disease.  Intermittent episodes - right side up to shoulder.  Obtain abdominal ultrasound.  Further w/up pending results.  Currently pain free.

## 2017-01-08 NOTE — Assessment & Plan Note (Signed)
Will have her take protonix regularly.  See if symptoms improve.  She has GI appt already scheduled with Dr Allen Norris next week.  Check abdominal ultrasound as oulinted.

## 2017-01-09 ENCOUNTER — Ambulatory Visit: Payer: PPO | Admitting: Gastroenterology

## 2017-01-10 ENCOUNTER — Other Ambulatory Visit: Payer: Self-pay

## 2017-01-10 DIAGNOSIS — L905 Scar conditions and fibrosis of skin: Secondary | ICD-10-CM | POA: Diagnosis not present

## 2017-01-10 DIAGNOSIS — L448 Other specified papulosquamous disorders: Secondary | ICD-10-CM | POA: Diagnosis not present

## 2017-01-10 NOTE — Telephone Encounter (Signed)
Scheduled 03/12/17 for [facility] exam

## 2017-01-11 ENCOUNTER — Encounter: Payer: Self-pay | Admitting: Gastroenterology

## 2017-01-11 ENCOUNTER — Ambulatory Visit (INDEPENDENT_AMBULATORY_CARE_PROVIDER_SITE_OTHER): Payer: PPO | Admitting: Gastroenterology

## 2017-01-11 VITALS — BP 155/74 | HR 66 | Temp 98.9°F | Ht 71.0 in | Wt 169.0 lb

## 2017-01-11 DIAGNOSIS — K219 Gastro-esophageal reflux disease without esophagitis: Secondary | ICD-10-CM | POA: Diagnosis not present

## 2017-01-11 DIAGNOSIS — K8010 Calculus of gallbladder with chronic cholecystitis without obstruction: Secondary | ICD-10-CM | POA: Diagnosis not present

## 2017-01-11 NOTE — Progress Notes (Signed)
Primary Care Physician: Einar Pheasant, MD  Primary Gastroenterologist:  Dr. [name]  Chief Complaint  Patient presents with  . Chest Pain     starts around breast area radiates to right shoulder    HPI: Alexandra Mendoza is a 66 y.o. female here due to chest pain that she has been told is not cardiac in nature. The patient reports that her pain happens approximately 4 times a year and is in both sides of her chest over her breasts and radiates to her right scapula. The patient denies any nausea vomiting with these episodes. The patient had been put on Protonix and feels that that helped her symptoms with less occurrences of her pain. The patient was also found to have gallstones without any signs of gallbladder disease and was seen by surgery. She had been taken off of her Protonix by cardiology for fear of the side effects and she states that she felt worse off of the medication. She was told to take an H2 blocker which did not help her symptoms.  Current Outpatient Prescriptions  Medication Sig Dispense Refill  . amLODipine (NORVASC) 10 MG tablet Take 10 mg by mouth daily.  4  . aspirin EC 81 MG tablet Take 81 mg by mouth daily.    Marland Kitchen atorvastatin (LIPITOR) 20 MG tablet TAKE 1 TABLET (20 MG TOTAL) BY MOUTH ONCE DAILY.  5  . Docusate Calcium (STOOL SOFTENER PO) Take by mouth.    . hydrochlorothiazide (HYDRODIURIL) 25 MG tablet TAKE 1 TABLET (25 MG TOTAL) BY MOUTH ONCE DAILY.  0  . metoprolol tartrate (LOPRESSOR) 25 MG tablet Take 25 mg by mouth 2 (two) times daily.  5  . Omega-3 Fatty Acids (FISH OIL) 1000 MG CAPS Take 1,200 mg by mouth daily.    . pantoprazole (PROTONIX) 40 MG tablet Take 1 tablet (40 mg total) by mouth daily. Take 30 minutes before breakfast 30 tablet 2  . Probiotic Product (PROBIOTIC DAILY PO) Take by mouth.    . telmisartan (MICARDIS) 80 MG tablet TAKE 1 TABLET (80 MG TOTAL) BY MOUTH ONCE DAILY.  0  . traZODone (DESYREL) 50 MG tablet TAKE 1-2 TABLETS (50-100 MG  TOTAL) BY MOUTH AT BEDTIME AS NEEDED. 180 tablet 0  . VITAMIN D, ERGOCALCIFEROL, PO Take by mouth.     No current facility-administered medications for this visit.     Allergies as of 01/11/2017  . (No Known Allergies)    ROS:  General: Negative for anorexia, weight loss, fever, chills, fatigue, weakness. ENT: Negative for hoarseness, difficulty swallowing , nasal congestion. CV: Negative for chest pain, angina, palpitations, dyspnea on exertion, peripheral edema.  Respiratory: Negative for dyspnea at rest, dyspnea on exertion, cough, sputum, wheezing.  GI: See history of present illness. GU:  Negative for dysuria, hematuria, urinary incontinence, urinary frequency, nocturnal urination.  Endo: Negative for unusual weight change.    Physical Examination:   BP (!) 155/74   Pulse 66   Temp 98.9 F (37.2 C) (Oral)   Ht 5\' 11"  (1.803 m)   Wt 169 lb (76.7 kg)   BMI 23.57 kg/m   General: Well-nourished, well-developed in no acute distress.  Eyes: No icterus. Conjunctivae pink. Mouth: Oropharyngeal mucosa moist and pink , no lesions erythema or exudate. Lungs: Clear to auscultation bilaterally. Non-labored. Heart: Regular rate and rhythm, no murmurs rubs or gallops.  Abdomen: Bowel sounds are normal, nontender, nondistended, no hepatosplenomegaly or masses, no abdominal bruits or hernia , no rebound or  guarding.   Extremities: No lower extremity edema. No clubbing or deformities. Neuro: Alert and oriented x 3.  Grossly intact. Skin: Warm and dry, no jaundice.   Psych: Alert and cooperative, normal mood and affect.  Labs:    Imaging Studies: US Abdomen Complete  Result Date: 01/04/2017 CLINICAL DATA:  Persistent but intermittent epigastric and chest pain EXAM: ABDOMEN ULTRASOUND COMPLETE COMPARISON:  CT abdomen pelvis of 09/20/2012 FINDINGS: Gallbladder: The gallbladder is visualized and there are small gallstones within the gallbladder which are mobile. The largest of these  stones measures 4 mm in diameter. There is no pain over the gallbladder. Common bile duct: Diameter: The common bile duct is slightly prominent measuring 7.3 mm in diameter. Liver: The liver has a normal echogenic pattern. No focal hepatic abnormality is seen. IVC: No abnormality visualized. Pancreas: The midportion of the pancreas appears normal. Both the head and tail of the pancreas are obscured by bowel gas and cannot be well evaluated. Spleen: The spleen measures 6.0 cm. Right Kidney: Length: 10.8 cm.  No hydronephrosis is seen. Left Kidney: Length: 11.1 cm. No hydronephrosis is noted. 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 Abdominal aorta: The abdominal aorta is normal caliber. Other findings: None. IMPRESSION: 1. Small gallstones within the gallbladder. The common bile duct is within upper limits of normal and correlation with LFTs is recommended. 2. Much of the pancreas is obscured by bowel gas. 3. Hypoechoic structure near the upper pole of the left kidney consistent with the previously demonstrated left adrenal myelolipoma. Electronically Signed   By: Ivar Drape M.D.   On: 01/04/2017 08:40    Assessment and Plan:   Alexandra Mendoza is a 66 y.o. y/o female with a history of lateral chest pain that radiates to the right side. The patient has a finding of gallstones without any sign of cholecystitis or pericholecystic fluid. The patient has been seen by surgery this morning and they discussed possible cholecystectomy. The patient has not had any symptoms associated with food or fatty foods. The patient has been doing better on Protonix and has been told to go back on the Protonix. The patient will contact me if the symptoms return. She may be undergoing esophageal spasms as the cause of her symptoms. The patient has been explained the plan and agrees with it.    [name],  MD. Marval Regal   Note: This dictation was prepared with Dragon dictation along with smaller phrase technology. Any transcriptional errors that result from this process are unintentional.

## 2017-01-30 ENCOUNTER — Ambulatory Visit: Payer: PPO | Admitting: Gastroenterology

## 2017-02-02 ENCOUNTER — Other Ambulatory Visit: Payer: Self-pay | Admitting: Internal Medicine

## 2017-02-02 ENCOUNTER — Other Ambulatory Visit: Payer: Self-pay

## 2017-02-02 MED ORDER — PANTOPRAZOLE SODIUM 40 MG PO TBEC: 40.0000 mg | DELAYED_RELEASE_TABLET | Freq: Every day | ORAL | 2 refills | Status: DC

## 2017-02-14 ENCOUNTER — Telehealth: Payer: Self-pay

## 2017-02-14 NOTE — Telephone Encounter (Signed)
Pt called stating she is not feeling any better. Last ov was on 01/11/17. She was told to resume the Protonix daily. Please advise. You stated in your note she may be having esophageal spasms.

## 2017-02-15 NOTE — Telephone Encounter (Signed)
Set the patient up for esophageal manometry for possible spasms

## 2017-02-16 NOTE — Telephone Encounter (Signed)
LVM for pt to return my call.

## 2017-02-20 ENCOUNTER — Other Ambulatory Visit: Payer: Self-pay

## 2017-02-20 DIAGNOSIS — K2100 Gastro-esophageal reflux disease with esophagitis, without bleeding: Secondary | ICD-10-CM

## 2017-02-21 ENCOUNTER — Encounter: Payer: Self-pay | Admitting: *Deleted

## 2017-02-21 NOTE — Telephone Encounter (Signed)
EGD at [facility] on 02/23/17 with Wohl.  GERD K21.9

## 2017-02-21 NOTE — Discharge Instructions (Signed)
General Anesthesia, Adult, Care After °These instructions provide you with information about caring for yourself after your procedure. Your health care provider may also give you more specific instructions. Your treatment has been planned according to current medical practices, but problems sometimes occur. Call your health care provider if you have any problems or questions after your procedure. °What can I expect after the procedure? °After the procedure, it is common to have: °· Vomiting. °· A sore throat. °· Mental slowness. ° °It is common to feel: °· Nauseous. °· Cold or shivery. °· Sleepy. °· Tired. °· Sore or achy, even in parts of your body where you did not have surgery. ° °Follow these instructions at home: °For at least 24 hours after the procedure: °· Do not: °? Participate in activities where you could fall or become injured. °? Drive. °? Use heavy machinery. °? Drink alcohol. °? Take sleeping pills or medicines that cause drowsiness. °? Make important decisions or sign legal documents. °? Take care of children on your own. °· Rest. °Eating and drinking °· If you vomit, drink water, juice, or soup when you can drink without vomiting. °· Drink enough fluid to keep your urine clear or pale yellow. °· Make sure you have little or no nausea before eating solid foods. °· Follow the diet recommended by your health care provider. °General instructions °· Have a responsible adult stay with you until you are awake and alert. °· Return to your normal activities as told by your health care provider. Ask your health care provider what activities are safe for you. °· Take over-the-counter and prescription medicines only as told by your health care provider. °· If you smoke, do not smoke without supervision. °· Keep all follow-up visits as told by your health care provider. This is important. °Contact a health care provider if: °· You continue to have nausea or vomiting at home, and medicines are not helpful. °· You  cannot drink fluids or start eating again. °· You cannot urinate after 8-12 hours. °· You develop a skin rash. °· You have fever. °· You have increasing redness at the site of your procedure. °Get help right away if: °· You have difficulty breathing. °· You have chest pain. °· You have unexpected bleeding. °· You feel that you are having a life-threatening or urgent problem. °This information is not intended to replace advice given to you by your health care provider. Make sure you discuss any questions you have with your health care provider. °Document Released: 12/04/2000 Document Revised: 01/31/2016 Document Reviewed: 08/12/2015 °Elsevier Interactive Patient Education © 2018 [employer] ° °

## 2017-02-22 ENCOUNTER — Telehealth: Payer: Self-pay | Admitting: Gastroenterology

## 2017-02-22 NOTE — Telephone Encounter (Signed)
02/22/17 Faxed Prior Auth form for EGD [id number] / K21.9

## 2017-02-22 NOTE — Telephone Encounter (Signed)
02/22/17 [facility] LVM and NO prior auth required for EGD,

## 2017-02-23 ENCOUNTER — Ambulatory Visit
Admission: RE | Admit: 2017-02-23 | Discharge: 2017-02-23 | Disposition: A | Discharge status: Home / Self Care | Payer: PPO | Source: Ambulatory Visit | Attending: Gastroenterology | Admitting: Gastroenterology

## 2017-02-23 ENCOUNTER — Encounter
Admission: RE | Disposition: A | Discharge status: Home / Self Care | Payer: Self-pay | Source: Ambulatory Visit | Attending: Gastroenterology

## 2017-02-23 ENCOUNTER — Ambulatory Visit: Payer: PPO | Admitting: Anesthesiology

## 2017-02-23 DIAGNOSIS — R12 Heartburn: Secondary | ICD-10-CM

## 2017-02-23 DIAGNOSIS — Z79899 Other long term (current) drug therapy: Secondary | ICD-10-CM | POA: Insufficient documentation

## 2017-02-23 DIAGNOSIS — Z9049 Acquired absence of other specified parts of digestive tract: Secondary | ICD-10-CM | POA: Insufficient documentation

## 2017-02-23 DIAGNOSIS — K449 Diaphragmatic hernia without obstruction or gangrene: Secondary | ICD-10-CM | POA: Insufficient documentation

## 2017-02-23 DIAGNOSIS — Z951 Presence of aortocoronary bypass graft: Secondary | ICD-10-CM | POA: Insufficient documentation

## 2017-02-23 DIAGNOSIS — E785 Hyperlipidemia, unspecified: Secondary | ICD-10-CM | POA: Diagnosis not present

## 2017-02-23 DIAGNOSIS — I10 Essential (primary) hypertension: Secondary | ICD-10-CM | POA: Insufficient documentation

## 2017-02-23 DIAGNOSIS — K219 Gastro-esophageal reflux disease without esophagitis: Secondary | ICD-10-CM | POA: Insufficient documentation

## 2017-02-23 DIAGNOSIS — I2510 Atherosclerotic heart disease of native coronary artery without angina pectoris: Secondary | ICD-10-CM | POA: Insufficient documentation

## 2017-02-23 DIAGNOSIS — Z7982 Long term (current) use of aspirin: Secondary | ICD-10-CM | POA: Insufficient documentation

## 2017-02-23 DIAGNOSIS — G4700 Insomnia, unspecified: Secondary | ICD-10-CM | POA: Diagnosis not present

## 2017-02-23 HISTORY — PX: ESOPHAGOGASTRODUODENOSCOPY (EGD) WITH PROPOFOL: SHX5813

## 2017-02-23 SURGERY — ESOPHAGOGASTRODUODENOSCOPY (EGD) WITH PROPOFOL
Anesthesia: General | Site: Mouth | Wound class: Clean Contaminated

## 2017-02-23 MED ORDER — ACETAMINOPHEN 325 MG PO TABS: 325.0000 mg | ORAL_TABLET | ORAL | Status: DC | PRN

## 2017-02-23 MED ORDER — LACTATED RINGERS IV SOLN
INTRAVENOUS | Status: DC | PRN
  Administered 2017-02-23: 11:00:00 via INTRAVENOUS

## 2017-02-23 MED ORDER — PROPOFOL 10 MG/ML IV BOLUS
INTRAVENOUS | Status: DC | PRN
  Administered 2017-02-23: 40 mg via INTRAVENOUS
  Administered 2017-02-23: 100 mg via INTRAVENOUS

## 2017-02-23 MED ORDER — STERILE WATER FOR IRRIGATION IR SOLN
Status: DC | PRN
  Administered 2017-02-23: 11:00:00

## 2017-02-23 MED ORDER — LIDOCAINE HCL (CARDIAC) 20 MG/ML IV SOLN
INTRAVENOUS | Status: DC | PRN
  Administered 2017-02-23: 50 mg via INTRAVENOUS

## 2017-02-23 MED ORDER — ACETAMINOPHEN 160 MG/5ML PO SOLN: 325.0000 mg | ORAL | Status: DC | PRN

## 2017-02-23 SURGICAL SUPPLY — 32 items
BALLN DILATOR 10-12 8 (BALLOONS)
BALLN DILATOR 12-15 8 (BALLOONS)
BALLN DILATOR 15-18 8 (BALLOONS)
BALLN DILATOR CRE 0-12 8 (BALLOONS)
BALLN DILATOR ESOPH 8 10 CRE (MISCELLANEOUS) IMPLANT
BALLOON DILATOR 12-15 8 (BALLOONS) IMPLANT
BALLOON DILATOR 15-18 8 (BALLOONS) IMPLANT
BALLOON DILATOR CRE 0-12 8 (BALLOONS) IMPLANT
BLOCK BITE 60FR ADLT L/F GRN (MISCELLANEOUS) ×2 IMPLANT
CANISTER SUCT 1200ML W/VALVE (MISCELLANEOUS) ×2 IMPLANT
CLIP HMST 235XBRD CATH ROT (MISCELLANEOUS) IMPLANT
CLIP RESOLUTION 360 11X235 (MISCELLANEOUS)
FCP ESCP3.2XJMB 240X2.8X (MISCELLANEOUS)
FORCEPS BIOP RAD 4 LRG CAP 4 (CUTTING FORCEPS) IMPLANT
FORCEPS BIOP RJ4 240 W/NDL (MISCELLANEOUS)
FORCEPS ESCP3.2XJMB 240X2.8X (MISCELLANEOUS) IMPLANT
GOWN CVR UNV OPN BCK APRN NK (MISCELLANEOUS) ×2 IMPLANT
GOWN ISOL THUMB LOOP REG UNIV (MISCELLANEOUS) ×2
INJECTOR VARIJECT VIN23 (MISCELLANEOUS) IMPLANT
KIT DEFENDO VALVE AND CONN (KITS) IMPLANT
KIT ENDO PROCEDURE OLY (KITS) ×2 IMPLANT
MARKER SPOT ENDO TATTOO 5ML (MISCELLANEOUS) IMPLANT
PAD GROUND ADULT SPLIT (MISCELLANEOUS) IMPLANT
RETRIEVER NET PLAT FOOD (MISCELLANEOUS) IMPLANT
SNARE SHORT THROW 13M SML OVAL (MISCELLANEOUS) IMPLANT
SNARE SHORT THROW 30M LRG OVAL (MISCELLANEOUS) IMPLANT
SPOT EX ENDOSCOPIC TATTOO (MISCELLANEOUS)
SYR INFLATION 60ML (SYRINGE) IMPLANT
TRAP ETRAP POLY (MISCELLANEOUS) IMPLANT
VARIJECT INJECTOR VIN23 (MISCELLANEOUS)
WATER STERILE IRR 250ML POUR (IV SOLUTION) ×2 IMPLANT
WIRE CRE 18-20MM 8CM F G (MISCELLANEOUS) IMPLANT

## 2017-02-23 NOTE — Anesthesia Postprocedure Evaluation (Signed)
Anesthesia Post Note  Patient: Alexandra Mendoza  Procedure(s) Performed: Procedure(s) (LRB): ESOPHAGOGASTRODUODENOSCOPY (EGD) WITH PROPOFOL (N/A)  Patient location during evaluation: PACU Anesthesia Type: General Level of consciousness: awake and alert and oriented Pain management: satisfactory to patient Vital Signs Assessment: post-procedure vital signs reviewed and stable Respiratory status: spontaneous breathing, nonlabored ventilation and respiratory function stable Cardiovascular status: blood pressure returned to baseline and stable Postop Assessment: Adequate PO intake and No signs of nausea or vomiting Anesthetic complications: no    Raliegh Ip

## 2017-02-23 NOTE — Anesthesia Procedure Notes (Signed)
Date/Time: 02/23/2017 10:32 AM Performed by: Cameron Ali Pre-anesthesia Checklist: Patient identified, Emergency Drugs available, Suction available, Timeout performed and Patient being monitored Patient Re-evaluated:Patient Re-evaluated prior to inductionOxygen Delivery Method: Nasal cannula Placement Confirmation: positive ETCO2

## 2017-02-23 NOTE — Anesthesia Preprocedure Evaluation (Signed)
Anesthesia Evaluation  Patient identified by MRN, date of birth, ID band Patient awake    Reviewed: Allergy & Precautions, H&P , NPO status , Patient's Chart, lab work & pertinent test results  Airway Mallampati: II  TM Distance: >3 FB Neck ROM: full    Dental no notable dental hx.    Pulmonary    Pulmonary exam normal        Cardiovascular hypertension, + CAD  Normal cardiovascular exam+ Valvular Problems/Murmurs      Neuro/Psych PSYCHIATRIC DISORDERS    GI/Hepatic GERD  ,  Endo/Other    Renal/GU      Musculoskeletal   Abdominal   Peds  Hematology   Anesthesia Other Findings   Reproductive/Obstetrics                             Anesthesia Physical Anesthesia Plan  ASA: II  Anesthesia Plan: General   Post-op Pain Management:    Induction:   PONV Risk Score and Plan: 3 and Propofol  Airway Management Planned:   Additional Equipment:   Intra-op Plan:   Post-operative Plan:   Informed Consent: I have reviewed the patients History and Physical, chart, labs and discussed the procedure including the risks, benefits and alternatives for the proposed anesthesia with the patient or authorized representative who has indicated his/her understanding and acceptance.     Plan Discussed with:   Anesthesia Plan Comments:         Anesthesia Quick Evaluation

## 2017-02-23 NOTE — Op Note (Signed)
[facility] Gastroenterology Patient Name: Alexandra Mendoza Procedure Date: 02/23/2017 10:27 AM MRN: [id number] Account #: [id number] Date of Birth: February 25, 1951 Admit Type: Outpatient Age: 66 Room: [facility] OR ROOM 01 Gender: Female Note Status: Finalized Procedure:            Upper GI endoscopy Indications:          Heartburn Providers:            [name] MD, MD Referring MD:         Einar Pheasant, MD (Referring MD) Medicines:            Propofol per Anesthesia Complications:        No immediate complications. Procedure:            Pre-Anesthesia Assessment:                       - Prior to the procedure, a History and Physical was                        performed, and patient medications and allergies were                        reviewed. The patient's tolerance of previous                        anesthesia was also reviewed. The risks and benefits of                        the procedure and the sedation options and risks were                        discussed with the patient. All questions were                        answered, and informed consent was obtained. Prior                        Anticoagulants: The patient has taken no previous                        anticoagulant or antiplatelet agents. ASA Grade                        Assessment: II - A patient with mild systemic disease.                        After reviewing the risks and benefits, the patient was                        deemed in satisfactory condition to undergo the                        procedure.                       After obtaining informed consent, the endoscope was                        passed under direct vision. Throughout the procedure,  the patient's blood pressure, pulse, and oxygen                        saturations were monitored continuously. The Olympus                        [id number]-HQ190 Endoscope (S#. [id number]) was introduced                        through the  mouth, and advanced to the second part of                        duodenum. The upper GI endoscopy was accomplished                        without difficulty. The patient tolerated the procedure                        well. Findings:      A small hiatal hernia was present.      The stomach was normal.      The examined duodenum was normal. Impression:           - Small hiatal hernia.                       - Normal stomach.                       - Normal examined duodenum.                       - No specimens collected. Recommendation:       - Discharge patient to home.                       - Resume previous diet.                       - Continue present medications. Procedure Code(s):    --- Professional ---                       [phone], Esophagogastroduodenoscopy, flexible, transoral;                        diagnostic, including collection of specimen(s) by                        brushing or washing, when performed (separate procedure) Diagnosis Code(s):    --- Professional ---                       R12, Heartburn CPT copyright 2016 American Medical Association. All rights reserved. The codes documented in this report are preliminary and upon coder review may  be revised to meet current compliance requirements. [name] MD, MD 02/23/2017 10:40:16 AM This report has been signed electronically. Number of Addenda: 0 Note Initiated On: 02/23/2017 10:27 AM      [facility]

## 2017-02-23 NOTE — Transfer of Care (Signed)
Immediate Anesthesia Transfer of Care Note  Patient: Alexandra Mendoza  Procedure(s) Performed: Procedure(s): ESOPHAGOGASTRODUODENOSCOPY (EGD) WITH PROPOFOL (N/A)  Patient Location: PACU  Anesthesia Type: General  Level of Consciousness: awake, alert  and patient cooperative  Airway and Oxygen Therapy: Patient Spontanous Breathing and Patient connected to supplemental oxygen  Post-op Assessment: Post-op Vital signs reviewed, Patient's Cardiovascular Status Stable, Respiratory Function Stable, Patent Airway and No signs of Nausea or vomiting  Post-op Vital Signs: Reviewed and stable  Complications: No apparent anesthesia complications

## 2017-02-23 NOTE — H&P (Signed)
[name], MD [address] [place], [place] [address] Phone:[phone] Fax : [phone]  Primary Care Physician:  Einar Pheasant, MD Primary Gastroenterologist:  Dr. Allen Norris  Pre-Procedure History & Physical: HPI:  Alexandra Mendoza is a 66 y.o. female is here for an endoscopy.   Past Medical History:  Diagnosis Date  . Acid reflux   . Coronary artery disease   . 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 Heart murmur   . History of chicken pox   . Hyperlipidemia   . Hypertension   . Insomnia     Past Surgical History:  Procedure Laterality Date  . APPENDECTOMY    . BREAST EXCISIONAL BIOPSY Right    negative over 5 years ago- neg  . BREAST SURGERY     Biopsy  . COLONOSCOPY WITH PROPOFOL N/A 05/16/2016   Procedure: COLONOSCOPY WITH PROPOFOL;  Surgeon: [name], MD;  Location: [facility] ENDOSCOPY;  Service: Endoscopy;  Laterality: N/A;  . CORONARY ARTERY BYPASS GRAFT  05/01/2014   3 vessel, [facility]  . HYSTEROSCOPY W/D&C    . lipoma removed     removed from forehead  . triple bypass      Prior to Admission medications   Medication Sig Start Date End Date Taking? Authorizing Provider  amLODipine (NORVASC) 10 MG tablet Take 10 mg by mouth daily. 03/08/15  Yes [provider]  aspirin EC 81 MG tablet Take 81 mg by mouth daily. 10/04/12  Yes [provider]  atorvastatin (LIPITOR) 20 MG tablet TAKE 1 TABLET (20 MG TOTAL) BY MOUTH ONCE DAILY. 11/22/15  Yes [provider]  Docusate Calcium (STOOL SOFTENER PO) Take by mouth.   Yes [provider]  Flaxseed, Linseed, (FLAX SEEDS PO) Take by mouth daily.   Yes [provider]  hydrochlorothiazide (HYDRODIURIL) 25 MG tablet TAKE 1 TABLET (25 MG TOTAL) BY MOUTH ONCE DAILY. 03/15/16  Yes [provider]  metoprolol tartrate (LOPRESSOR) 25 MG tablet Take 25 mg by mouth 2 (two) times daily. 02/05/15  Yes [provider]  pantoprazole  (PROTONIX) 40 MG tablet Take 1 tablet (40 mg total) by mouth daily. Take 30 minutes before breakfast 02/02/17  Yes Einar Pheasant, MD  Probiotic Product (PROBIOTIC DAILY PO) Take by mouth every other day.    Yes [provider]  telmisartan (MICARDIS) 80 MG tablet TAKE 1 TABLET (80 MG TOTAL) BY MOUTH ONCE DAILY. 03/15/16  Yes [provider]  traZODone (DESYREL) 50 MG tablet TAKE 1-2 TABLETS (50-100 MG TOTAL) BY MOUTH AT BEDTIME AS NEEDED. 01/01/17  Yes Einar Pheasant, MD  VITAMIN D, ERGOCALCIFEROL, PO Take by mouth.   Yes [provider]  Omega-3 Fatty Acids (FISH OIL) 1000 MG CAPS Take 1,200 mg by mouth daily.    [provider]    Allergies as of 02/20/2017  . (No Known Allergies)    Family History  Problem Relation Age of Onset  . Breast cancer Unknown        maternal great aunt  . Heart disease Unknown        multiple family members  . Heart disease Mother   . Heart disease Father   . Breast cancer Sister 29  . Colon cancer Neg Hx   . Diabetes Neg Hx   . Ovarian cancer Neg Hx     Social History   Social History  . Marital status: Married    Spouse name: N/A  .  Number of children: N/A  . Years of education: N/A   Occupational History  . Not on file.   Social History Main Topics  . Smoking status: Never Smoker  . Smokeless tobacco: Never Used  . Alcohol use 0.0 oz/week     Comment: socially - 1x/3 wks  . Drug use: No  . Sexual activity: Yes    Birth control/ protection: Post-menopausal   Other Topics Concern  . Not on file   Social History Narrative  . No narrative on file    Review of Systems: See HPI, otherwise negative ROS  Physical Exam: BP (!) 158/70   Pulse (!) 57   Temp 97 F (36.1 C) (Temporal)   Resp 16   Ht 5\' 11"  (1.803 m)   Wt 167 lb (75.8 kg)   SpO2 100%   BMI 23.29 kg/m  General:   Alert,  pleasant and cooperative in NAD Head:  Normocephalic and atraumatic. Neck:  Supple; no masses or  thyromegaly. Lungs:  Clear throughout to auscultation.    Heart:  Regular rate and rhythm. Abdomen:  Soft, nontender and nondistended. Normal bowel sounds, without guarding, and without rebound.   Neurologic:  Alert and  oriented x4;  grossly normal neurologically.  Impression/Plan: Alexandra Mendoza is here for an endoscopy to be performed for GERD  Risks, benefits, limitations, and alternatives regarding  endoscopy have been reviewed with the patient.  Questions have been answered.  All parties agreeable.   [name], MD  02/23/2017, 10:26 AM

## 2017-02-26 ENCOUNTER — Encounter: Payer: Self-pay | Admitting: Gastroenterology

## 2017-03-08 ENCOUNTER — Other Ambulatory Visit (INDEPENDENT_AMBULATORY_CARE_PROVIDER_SITE_OTHER): Payer: PPO

## 2017-03-08 ENCOUNTER — Encounter: Payer: Self-pay | Admitting: Internal Medicine

## 2017-03-08 DIAGNOSIS — E7800 Pure hypercholesterolemia, unspecified: Secondary | ICD-10-CM

## 2017-03-08 DIAGNOSIS — I10 Essential (primary) hypertension: Secondary | ICD-10-CM | POA: Diagnosis not present

## 2017-03-08 LAB — LIPID PANEL
CHOLESTEROL: 114 mg/dL (ref 0–200)
HDL: 37.1 mg/dL — AB (ref >39.00)
LDL Cholesterol: 63 mg/dL (ref 0–99)
NonHDL: 77.19
TRIGLYCERIDES: 69 mg/dL (ref 0.0–149.0)
Total CHOL/HDL Ratio: 3
VLDL: 13.8 mg/dL (ref 0.0–40.0)

## 2017-03-08 LAB — BASIC METABOLIC PANEL
BUN: 18 mg/dL (ref 6–23)
CO2: 32 mEq/L (ref 19–32)
CREATININE: 0.94 mg/dL (ref 0.40–1.20)
Calcium: 10.3 mg/dL (ref 8.4–10.5)
Chloride: 104 mEq/L (ref 96–112)
GFR: 63.36 mL/min (ref >60.00)
GLUCOSE: 86 mg/dL (ref 70–99)
Potassium: 3.9 mEq/L (ref 3.5–5.1)
Sodium: 141 mEq/L (ref 135–145)

## 2017-03-08 LAB — HEPATIC FUNCTION PANEL
ALT: 16 U/L (ref 0–35)
AST: 17 U/L (ref 0–37)
Albumin: 4.3 g/dL (ref 3.5–5.2)
Alkaline Phosphatase: 49 U/L (ref 39–117)
BILIRUBIN TOTAL: 0.6 mg/dL (ref 0.2–1.2)
Bilirubin, Direct: 0.1 mg/dL (ref 0.0–0.3)
Total Protein: 6.5 g/dL (ref 6.0–8.3)

## 2017-03-12 ENCOUNTER — Encounter: Payer: Self-pay | Admitting: Internal Medicine

## 2017-03-12 ENCOUNTER — Ambulatory Visit (INDEPENDENT_AMBULATORY_CARE_PROVIDER_SITE_OTHER): Payer: PPO | Admitting: Internal Medicine

## 2017-03-12 VITALS — BP 116/64 | HR 59 | Temp 98.7°F | Resp 12 | Ht 71.0 in | Wt 168.8 lb

## 2017-03-12 DIAGNOSIS — E2839 Other primary ovarian failure: Secondary | ICD-10-CM

## 2017-03-12 DIAGNOSIS — Z0001 Encounter for general adult medical examination with abnormal findings: Secondary | ICD-10-CM

## 2017-03-12 DIAGNOSIS — I2510 Atherosclerotic heart disease of native coronary artery without angina pectoris: Secondary | ICD-10-CM | POA: Diagnosis not present

## 2017-03-12 DIAGNOSIS — I059 Rheumatic mitral valve disease, unspecified: Secondary | ICD-10-CM | POA: Diagnosis not present

## 2017-03-12 DIAGNOSIS — R21 Rash and other nonspecific skin eruption: Secondary | ICD-10-CM

## 2017-03-12 DIAGNOSIS — E7800 Pure hypercholesterolemia, unspecified: Secondary | ICD-10-CM

## 2017-03-12 DIAGNOSIS — I10 Essential (primary) hypertension: Secondary | ICD-10-CM

## 2017-03-12 DIAGNOSIS — R079 Chest pain, unspecified: Secondary | ICD-10-CM

## 2017-03-12 DIAGNOSIS — Z0000 Encounter for general adult medical examination without abnormal findings: Secondary | ICD-10-CM

## 2017-03-12 NOTE — Progress Notes (Addendum)
[name], female   DOB: 11-26-1950, 66 y.o.   MRN: [id number] [name], female    DOB: 12-Jun-1951  Age: 66 y.o. MRN: [id number]  The patient is here for annual Medicare wellness examination and management of other chronic and acute problems.   The risk factors are reflected in the social history.  She is also followed by cardiology - Dr Nehemiah Massed and dermatology - Dr Kellie Moor.    Activities of daily living:  The patient is 100% independent in all ADLs: dressing, toileting, feeding as well as independent mobility  Home safety : she feels safe in her home.   There is no violence in the home.   There is no risks for hepatitis, STDs or HIV.  She has no travel history to infectious disease endemic areas of the world.  She sees a dentist regularly - Dr Lynelle Smoke.  She sees her eye doctor regularly - Dr Thomasene Ripple.   No hearing change.   She does not have excessive sun exposure.   Need for sun protection: hats, long sleeves and use of sunscreen if there is significant sun exposure.   Diet: the importance of a healthy diet is discussed. She tries to watch her diet.    The benefits of regular aerobic exercise were discussed. She exercises three times per week.    Depression screen: there are no signs or vegative symptoms of depression- irritability, change in appetite, anhedonia, sadness/tearfullness.  Cognitive assessment: the patient manages all her financial and personal affairs and is actively engaged. She could relate day,date,year and events; recalled 3/3 objects at 3 minutes.    The following portions of the patient's history were reviewed and updated as appropriate: allergies, current medications, past family history, past medical history,  past surgical history, past social history  and problem list.  Visual acuity was not assessed per patient preference since she has regular follow up with her ophthalmologist. Hearing and body mass index were assessed and  reviewed.   During the course of the visit the patient was educated and counseled about appropriate screening and preventive services including : fall prevention , diabetes screening, nutrition counseling, colorectal cancer screening, and recommended immunizations.  She is up to date with immunizations.  Did inform her of new shingles vaccine.  rx given for shingrix.    CC: The primary encounter diagnosis was Estrogen deficiency. Diagnoses of Mitral valve disorder, Hypercholesterolemia, Essential hypertension, Chest pain, unspecified type, Coronary artery disease involving native coronary artery of native heart without angina pectoris, Rash, and Routine general medical examination at a health care facility were also pertinent to this visit.  History Yalexa has a past medical history of Acid reflux; Coronary artery disease; Heart disease; Heart murmur; History of chicken pox; Hyperlipidemia; Hypertension; and Insomnia.   She has a past surgical history that includes Appendectomy; triple bypass; Hysteroscopy w/D&C; Breast surgery; lipoma removed; Breast excisional biopsy (Right); Colonoscopy with propofol (N/A, 05/16/2016); Coronary artery bypass graft (05/01/2014); and Esophagogastroduodenoscopy (egd) with propofol (N/A, 02/23/2017).   Her family history includes Breast cancer (age of onset: 30) in her sister; Heart disease in her father and mother.She reports that she has never smoked. She has never used smokeless tobacco. She reports that she drinks alcohol. She reports that she does not use drugs.  Outpatient Medications Prior to Visit  Medication Sig Dispense Refill  . amLODipine (NORVASC) 10 MG tablet Take 10 mg by mouth daily.  4  . aspirin EC 81 MG tablet Take  81 mg by mouth daily.    Marland Kitchen atorvastatin (LIPITOR) 20 MG tablet TAKE 1 TABLET (20 MG TOTAL) BY MOUTH ONCE DAILY.  5  . Docusate Calcium (STOOL SOFTENER PO) Take by mouth.    . hydrochlorothiazide (HYDRODIURIL) 25 MG tablet TAKE 1 TABLET  (25 MG TOTAL) BY MOUTH ONCE DAILY.  0  . metoprolol tartrate (LOPRESSOR) 25 MG tablet Take 25 mg by mouth 2 (two) times daily.  5  . Omega-3 Fatty Acids (FISH OIL) 1000 MG CAPS Take 1,200 mg by mouth daily.    . pantoprazole (PROTONIX) 40 MG tablet Take 1 tablet (40 mg total) by mouth daily. Take 30 minutes before breakfast 30 tablet 2  . Probiotic Product (PROBIOTIC DAILY PO) Take by mouth every other day.     . telmisartan (MICARDIS) 80 MG tablet TAKE 1 TABLET (80 MG TOTAL) BY MOUTH ONCE DAILY.  0  . traZODone (DESYREL) 50 MG tablet TAKE 1-2 TABLETS (50-100 MG TOTAL) BY MOUTH AT BEDTIME AS NEEDED. 180 tablet 0  . VITAMIN D, ERGOCALCIFEROL, PO Take by mouth.    . Flaxseed, Linseed, (FLAX SEEDS PO) Take by mouth daily.     No facility-administered medications prior to visit.     Review of Systems  Constitutional: Negative for appetite change and unexpected weight change.  HENT: Negative for congestion and sinus pressure.   Respiratory: Negative for cough, chest tightness and shortness of breath.   Cardiovascular: Negative for palpitations and leg swelling.       She is currently without chest pain. She did have questions regarding her intermittent episodes of discomfort.  See previous notes.  She has seen cardiology recently.  Felt no further w/up warranted.  EGD normal.  Was evaluated by surgery for her gallbladder.  Recommended removal.  She wants to monitor for now.  Currently without pain, but does flare intermittently.  States just resolved over this past week.    Gastrointestinal: Negative for abdominal pain, diarrhea, nausea and vomiting.  Genitourinary: Negative for difficulty urinating and dysuria.  Musculoskeletal: Negative for joint swelling and myalgias.  Skin: Negative for color change and rash.  Neurological: Negative for dizziness, light-headedness and headaches.  Psychiatric/Behavioral: Negative for agitation and dysphoric mood.    Objective:  BP 116/64 (BP Location: Left  Arm, Patient Position: Sitting, Cuff Size: Normal)   Pulse (!) 59   Temp 98.7 F (37.1 C) (Oral)   Resp 12   Ht 5\' 11"  (1.803 m)   Wt 168 lb 12.8 oz (76.6 kg)   SpO2 97%   BMI 23.54 kg/m   Physical Exam  Constitutional: She appears well-developed and well-nourished. No distress.  HENT:  Nose: Nose normal.  Mouth/Throat: Oropharynx is clear and moist.  Neck: Neck supple. No thyromegaly present.  Cardiovascular: Normal rate and regular rhythm.   Pulmonary/Chest: Breath sounds normal. No respiratory distress. She has no wheezes.  Abdominal: Soft. Bowel sounds are normal. There is no tenderness.  Musculoskeletal: She exhibits no edema or tenderness.  Lymphadenopathy:    She has no cervical adenopathy.  Skin:  Rash right lower abdomen that extends to right upper thigh.  No pain.    Psychiatric: She has a normal mood and affect. Her behavior is normal.      Assessment & Plan:   Problem List Items Addressed This Visit    CAD (coronary artery disease)    Known disease.  Followed by Dr Nehemiah Massed.  Just evaluated.  Felt no further w/up warranted.  Continue aggressive risk  factor modification.        Chest pain    Persistent intermittent flares as outlined.  Has recently seen cardiology.  Felt no further w/up warranted.  Note reviewed.  Saw GI.  S/p EGD as outlined - normal.  Saw surgery.  Recommended gallbladder removal.  She wants to hold.  Had questions about persistent intermittent flares.  No pain now.  Follow.        Essential hypertension    Blood pressure has been under good control on her outside checks.  Elevated on my check here.  Have her spot check her pressure.   Continue same medication regimen.  Follow pressures.  Follow metabolic panel.        Relevant Orders   CBC with Differential/Platelet   TSH   Basic metabolic panel   Hypercholesterolemia    On lipitor.  Discussed recent labs.  Continue current medication regimen and diet and exercise.  Follow lipid panel  and liver function tests.        Relevant Orders   Hepatic function panel   Lipid panel   Mitral valve disorder    Followed by cardiology.  States planning for f/u echo next visit.        Other Visit Diagnoses    Estrogen deficiency    -  Primary   Relevant Orders   DG Bone Density   Rash       Relevant Orders   Ambulatory referral to Dermatology   Routine general medical examination at a health care facility          I have discontinued Ms. Beevers's (Flaxseed, Linseed, (FLAX SEEDS PO)). I am also having her maintain her aspirin EC, Fish Oil, metoprolol tartrate, amLODipine, (VITAMIN D, ERGOCALCIFEROL, PO), Probiotic Product (PROBIOTIC DAILY PO), Docusate Calcium (STOOL SOFTENER PO), atorvastatin, hydrochlorothiazide, telmisartan, traZODone, and pantoprazole.  No orders of the defined types were placed in this encounter.   Medications Discontinued During This Encounter  Medication Reason  . Flaxseed, Linseed, (FLAX SEEDS PO) Therapy completed    Follow-up: Return in about 4 months (around 07/13/2017) for follow up appt (51min).    Addendum:  End of Life discussion:  Pt has a health care power of attorney.  Discussed - Living will.  She plans to discuss with her family and proceed with a living will.     During visit, we discussed screening tests.  She is upt to date with her immunizations.  She is also up to date with pap smear, mammogram and colon cancer screening.  Updated health maintenance list - to be mailed to her.    Dr Colen Darling, MD

## 2017-03-12 NOTE — Progress Notes (Signed)
Pre-visit discussion using our clinic review tool. No additional management support is needed unless otherwise documented below in the visit note.  

## 2017-03-13 ENCOUNTER — Encounter: Payer: Self-pay | Admitting: Internal Medicine

## 2017-03-13 NOTE — Assessment & Plan Note (Signed)
On lipitor.  Discussed recent labs.  Continue current medication regimen and diet and exercise.  Follow lipid panel and liver function tests.

## 2017-03-13 NOTE — Assessment & Plan Note (Signed)
Known disease.  Followed by Dr Nehemiah Massed.  Just evaluated.  Felt no further w/up warranted.  Continue aggressive risk factor modification.

## 2017-03-13 NOTE — Assessment & Plan Note (Signed)
Blood pressure has been under good control on her outside checks.  Elevated on my check here.  Have her spot check her pressure.   Continue same medication regimen.  Follow pressures.  Follow metabolic panel.

## 2017-03-13 NOTE — Assessment & Plan Note (Signed)
Followed by cardiology.  States planning for f/u echo next visit.

## 2017-03-13 NOTE — Assessment & Plan Note (Signed)
Persistent intermittent flares as outlined.  Has recently seen cardiology.  Felt no further w/up warranted.  Note reviewed.  Saw GI.  S/p EGD as outlined - normal.  Saw surgery.  Recommended gallbladder removal.  She wants to hold.  Had questions about persistent intermittent flares.  No pain now.  Follow.

## 2017-03-15 NOTE — Patient Instructions (Addendum)
  Alexandra Mendoza , Thank you for taking time to come for your Medicare Wellness Visit. I appreciate your ongoing commitment to your health goals. Please review the following plan we discussed and let me know if I can assist you in the future.    This is a list of the screening recommended for you and due dates:  Health Maintenance  Topic Date Due  . HIV Screening  05/25/1966  . Flu Shot  04/11/2017  . Mammogram  05/12/2017  . Pap Smear  06/01/2019  . Pneumonia vaccines (2 of 2 - PPSV23) 07/03/2019  . Tetanus Vaccine  08/15/2025  . Colon Cancer Screening  05/16/2026  . DEXA scan (bone density measurement)  Completed  .  Hepatitis C: One time screening is recommended by Center for Disease Control  (CDC) for  adults born from [birth year] through 1965.   Completed

## 2017-03-28 ENCOUNTER — Other Ambulatory Visit: Payer: Self-pay | Admitting: Internal Medicine

## 2017-04-05 DIAGNOSIS — L309 Dermatitis, unspecified: Secondary | ICD-10-CM | POA: Diagnosis not present

## 2017-04-05 DIAGNOSIS — L817 Pigmented purpuric dermatosis: Secondary | ICD-10-CM | POA: Diagnosis not present

## 2017-04-06 ENCOUNTER — Other Ambulatory Visit: Payer: Self-pay | Admitting: Obstetrics and Gynecology

## 2017-04-06 DIAGNOSIS — Z1231 Encounter for screening mammogram for malignant neoplasm of breast: Secondary | ICD-10-CM

## 2017-04-16 ENCOUNTER — Other Ambulatory Visit: Payer: Self-pay

## 2017-04-16 MED ORDER — PANTOPRAZOLE SODIUM 40 MG PO TBEC: 40.0000 mg | DELAYED_RELEASE_TABLET | Freq: Every day | ORAL | 1 refills | Status: DC

## 2017-05-16 ENCOUNTER — Ambulatory Visit
Admission: RE | Admit: 2017-05-16 | Discharge: 2017-05-16 | Disposition: A | Discharge status: Home / Self Care | Payer: PPO | Source: Ambulatory Visit | Attending: Internal Medicine | Admitting: Internal Medicine

## 2017-05-16 ENCOUNTER — Ambulatory Visit
Admission: RE | Admit: 2017-05-16 | Discharge: 2017-05-16 | Disposition: A | Discharge status: Home / Self Care | Payer: PPO | Source: Ambulatory Visit | Attending: Obstetrics and Gynecology | Admitting: Obstetrics and Gynecology

## 2017-05-16 DIAGNOSIS — R928 Other abnormal and inconclusive findings on diagnostic imaging of breast: Secondary | ICD-10-CM | POA: Diagnosis not present

## 2017-05-16 DIAGNOSIS — E2839 Other primary ovarian failure: Secondary | ICD-10-CM | POA: Insufficient documentation

## 2017-05-16 DIAGNOSIS — Z1231 Encounter for screening mammogram for malignant neoplasm of breast: Secondary | ICD-10-CM | POA: Insufficient documentation

## 2017-05-16 DIAGNOSIS — M85852 Other specified disorders of bone density and structure, left thigh: Secondary | ICD-10-CM | POA: Diagnosis not present

## 2017-05-16 DIAGNOSIS — Z780 Asymptomatic menopausal state: Secondary | ICD-10-CM | POA: Diagnosis not present

## 2017-05-17 ENCOUNTER — Other Ambulatory Visit: Payer: Self-pay | Admitting: Obstetrics and Gynecology

## 2017-05-17 DIAGNOSIS — R921 Mammographic calcification found on diagnostic imaging of breast: Secondary | ICD-10-CM

## 2017-05-17 DIAGNOSIS — R928 Other abnormal and inconclusive findings on diagnostic imaging of breast: Secondary | ICD-10-CM

## 2017-05-18 ENCOUNTER — Telehealth: Payer: Self-pay | Admitting: Obstetrics and Gynecology

## 2017-05-18 NOTE — Telephone Encounter (Signed)
Patient called and stated that she had her mammogram done a few days ago, The patient was calling to find out the results of that scan. Please advise.

## 2017-05-21 NOTE — Telephone Encounter (Signed)
Pt aware birad 0. Rt breast calcifications. Pt needs dx rt mammo and rt breast u/s. Pt to contact [facility] to make a f/u appt.

## 2017-05-24 ENCOUNTER — Ambulatory Visit
Admission: RE | Admit: 2017-05-24 | Discharge: 2017-05-24 | Disposition: A | Discharge status: Home / Self Care | Payer: PPO | Source: Ambulatory Visit | Attending: Obstetrics and Gynecology | Admitting: Obstetrics and Gynecology

## 2017-05-24 DIAGNOSIS — R928 Other abnormal and inconclusive findings on diagnostic imaging of breast: Secondary | ICD-10-CM | POA: Diagnosis not present

## 2017-05-24 DIAGNOSIS — R921 Mammographic calcification found on diagnostic imaging of breast: Secondary | ICD-10-CM

## 2017-06-07 NOTE — Progress Notes (Signed)
ANNUAL PREVENTATIVE CARE GYN  ENCOUNTER NOTE  Subjective:       Alexandra Mendoza is a 66 y.o. G1P1001. female here for a routine annual gynecologic exam.  Current complaints: 1. Medicare breast and pelvic 2. Menopause asymptomatic 3. No acute complaints  Has no vasomotor symptoms of menopause. No urinary or bowel changes. No pain. Still sexually active. Denies dysuria, vaginal bleeding, itching or discharge. Not currently using estrogen cream. Recently seen by dermatology for rash located on right upper thigh/lateral abdomen. Biopsy taken was negative for lymphoma, dx with petechiae.   Reports recent DEXA scan this year, has appt in 1 month to discuss results. States results showed osteopenia, unsure of exact score. Not currently taking calcium supplement.   Additionally reports occasional urgency when voiding. Denies frequency or occasions of losing urine. Does not require pads. States overall problem is not bothersome.   Gynecologic History No LMP recorded. Patient is postmenopausal. Contraception: post menopausal status Last Pap: 04/2015 ascus/neg. Results were:  Last mammogram: 05/16/2017 birad 0, 05/24/2017 birad 2 Results were: normal  Obstetric History OB History  Gravida Para Term Preterm AB Living  1 1 1     1   SAB TAB Ectopic Multiple Live Births          1    # Outcome Date GA Lbr Len/2nd Weight Sex Delivery Anes PTL Lv  1 Term 1975   8 lb 2.1 oz (3.688 kg) M Vag-Spont   LIV      Past Medical History:  Diagnosis Date  . Acid reflux   . Coronary artery disease   . 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 Heart murmur   . History of chicken pox   . Hyperlipidemia   . Hypertension   . Insomnia     Past Surgical History:  Procedure Laterality Date  . APPENDECTOMY    . BREAST EXCISIONAL BIOPSY Right    negative over 5 years ago- neg  . BREAST SURGERY     Biopsy  . COLONOSCOPY WITH PROPOFOL N/A 05/16/2016   Procedure: COLONOSCOPY WITH PROPOFOL;  Surgeon:  [name], MD;  Location: [facility] END[facility]COPY;  Service: Endoscopy;  Laterality: N/A;  . CORONARY ARTERY BYPASS GRAFT  05/01/2014   3 vessel, [facility]  . ESOPHAGOGASTRODUODENOSCOPY (EGD) WITH PROPOFOL N/A 02/23/2017   Procedure: ESOPHAGOGASTRODUODENOSCOPY (EGD) WITH PROPOFOL;  Surgeon: [name], MD;  Location: [place];  Service: Endoscopy;  Laterality: N/A;  . HYSTEROSCOPY W/D&C    . lipoma removed     removed from forehead  . triple bypass      Current Outpatient Prescriptions on File Prior to Visit  Medication Sig Dispense Refill  . amLODipine (NORVASC) 10 MG tablet Take 10 mg by mouth daily.  4  . aspirin EC 81 MG tablet Take 81 mg by mouth daily.    Marland Kitchen atorvastatin (LIPITOR) 20 MG tablet TAKE 1 TABLET (20 MG TOTAL) BY MOUTH ONCE DAILY.  5  . Docusate Calcium (STOOL SOFTENER PO) Take by mouth.    . hydrochlorothiazide (HYDRODIURIL) 25 MG tablet TAKE 1 TABLET (25 MG TOTAL) BY MOUTH ONCE DAILY.  0  . metoprolol tartrate (LOPRESSOR) 25 MG tablet Take 25 mg by mouth 2 (two) times daily.  5  . Omega-3 Fatty Acids (FISH OIL) 1000 MG CAPS Take 1,200 mg by mouth daily.    . pantoprazole (PROTONIX) 40 MG tablet Take 1 tablet (40 mg total) by mouth daily. Take 30 minutes before  breakfast 90 tablet 1  . Probiotic Product (PROBIOTIC DAILY PO) Take by mouth every other day.     . telmisartan (MICARDIS) 80 MG tablet TAKE 1 TABLET (80 MG TOTAL) BY MOUTH ONCE DAILY.  0  . traZODone (DESYREL) 50 MG tablet TAKE 1-2 TABLETS (50-100 MG TOTAL) BY MOUTH AT BEDTIME AS NEEDED. 180 tablet 1  . VITAMIN D, ERGOCALCIFEROL, PO Take by mouth.     No current facility-administered medications on file prior to visit.     No Known Allergies  Social History   Social History  . Marital status: Married    Spouse name: N/A  . Number of children: N/A  . Years of education: N/A   Occupational History  . Not on file.   Social History Main Topics  . Smoking status: Never Smoker  .  Smokeless tobacco: Never Used  . Alcohol use 0.0 oz/week     Comment: socially - 1x/3 wks  . Drug use: No  . Sexual activity: Yes    Birth control/ protection: Post-menopausal   Other Topics Concern  . Not on file   Social History Narrative  . No narrative on file    Family History  Problem Relation Age of Onset  . Breast cancer Unknown        maternal great aunt  . Heart disease Unknown        multiple family members  . Heart disease Mother   . Heart disease Father   . Breast cancer Sister 60  . Colon cancer Neg Hx   . Diabetes Neg Hx   . Ovarian cancer Neg Hx     The following portions of the patient's history were reviewed and updated as appropriate: allergies, current medications, past family history, past medical history, past social history, past surgical history and problem list.  Review of Systems Review of Systems  Constitutional: Negative for chills, fever and malaise/fatigue.  HENT: Negative for ear pain, hearing loss, sore throat and tinnitus.   Eyes: Negative for pain.  Respiratory: Negative for shortness of breath and wheezing.   Cardiovascular: Negative for chest pain, palpitations, claudication and leg swelling.  Gastrointestinal: Negative for abdominal pain, constipation, diarrhea, nausea and vomiting.  Genitourinary: Negative for dysuria, frequency and hematuria.  Musculoskeletal: Negative for myalgias.  Skin: Positive for rash ( Located on right upper right thigh/lateral right lower abdomen ).  Neurological: Negative for dizziness, loss of consciousness, weakness and headaches.  Psychiatric/Behavioral: Negative for depression.     Objective:   BP (!) 179/78   Pulse 67   Ht 5\' 11"  (1.803 m)   Wt 170 lb 6.4 oz (77.3 kg)   BMI 23.77 kg/m CONSTITUTIONAL: Well-developed, well-nourished female in no acute distress.  PSYCHIATRIC: Normal mood and affect. Normal behavior. Normal judgment and thought content. Alexandra Mendoza: Alert and oriented to person,  place, and time. Normal muscle tone coordination. No cranial nerve deficit noted. HENT:  Normocephalic, atraumatic, External right and left ear normal. Oropharynx is clear and moist, non erythematous with no tonsillar hypertrophy.  EYES: Conjunctivae and EOM are normal No scleral icterus.  NECK: Normal range of motion, supple, no masses.  Normal thyroid.  SKIN: Skin is warm and dry. Not diaphoretic. No erythema. No pallor. Erythematous papules with depigmentation located on the right anterior upper/lateral thigh and right lateral lower abdomen. No scaling or signs of infection. CARDIOVASCULAR: Normal heart rate noted, regular rhythm, no murmur. RESPIRATORY: Clear to auscultation bilaterally. Effort and breath sounds normal, no problems with respiration  noted. BREASTS: Symmetric in size. No masses, skin changes, nipple drainage, or lymphadenopathy.  ABDOMEN: Soft, normal bowel sounds, no distention noted.  No tenderness, rebound or guarding.  BLADDER: Normal PELVIC:  External Genitalia: Normal  BUS: Normal  Vagina: Moderate atrophy, no CMT, mobile, soft.   Uterus: Normal shape, size, and consistency. Midplane and mobile. No tenderness.  Adnexa: No masses or tenderness noted.  RV: External Exam NormaI, No Rectal Masses and Normal Sphincter tone  MUSCULOSKELETAL: Normal range of motion. No tenderness.  No cyanosis, clubbing, or edema.  2+ DP/PT pulses LYMPHATIC: No Axillary, Supraclavicular, cervical, or Inguinal Adenopathy.    Assessment:   Annual gynecologic examination 66 y.o. Contraception: post menopausal status Normal BMI Problem List Items Addressed This Visit    Family history of breast cancer in first degree relative   Fibrocystic breast changes of both breasts    Other Visit Diagnoses    Well woman exam with routine gynecological exam    -  Primary   Screening for colon cancer        Vaginal atrophy Menopause  Plan:  Pap: not needed Mammogram: utd Stool Guaiac Testing:  [facility] Labs: thru pcp Routine preventative health maintenance measures emphasized: Exercise/Diet/Weight control, Tobacco Warnings, Alcohol/Substance use risks, Stress Management, Peer Pressure Issues and Safe Sex  Begin calcium supplement Return to [place], CMA [employer], PA-S Brayton Mars, MD    Note: This dictation was prepared with Dragon dictation along with smaller phrase technology. Any transcriptional errors that result from this process are unintentional.

## 2017-06-11 DIAGNOSIS — R001 Bradycardia, unspecified: Secondary | ICD-10-CM | POA: Diagnosis not present

## 2017-06-11 DIAGNOSIS — I10 Essential (primary) hypertension: Secondary | ICD-10-CM | POA: Diagnosis not present

## 2017-06-11 DIAGNOSIS — I25810 Atherosclerosis of coronary artery bypass graft(s) without angina pectoris: Secondary | ICD-10-CM | POA: Diagnosis not present

## 2017-06-11 DIAGNOSIS — E782 Mixed hyperlipidemia: Secondary | ICD-10-CM | POA: Diagnosis not present

## 2017-06-12 ENCOUNTER — Encounter: Payer: Self-pay | Admitting: Obstetrics and Gynecology

## 2017-06-12 ENCOUNTER — Ambulatory Visit (INDEPENDENT_AMBULATORY_CARE_PROVIDER_SITE_OTHER): Payer: PPO | Admitting: Obstetrics and Gynecology

## 2017-06-12 VITALS — BP 157/69 | HR 67 | Ht 71.0 in | Wt 170.4 lb

## 2017-06-12 DIAGNOSIS — N6011 Diffuse cystic mastopathy of right breast: Secondary | ICD-10-CM

## 2017-06-12 DIAGNOSIS — N6012 Diffuse cystic mastopathy of left breast: Secondary | ICD-10-CM

## 2017-06-12 DIAGNOSIS — Z803 Family history of malignant neoplasm of breast: Secondary | ICD-10-CM | POA: Diagnosis not present

## 2017-06-12 DIAGNOSIS — Z1211 Encounter for screening for malignant neoplasm of colon: Secondary | ICD-10-CM | POA: Diagnosis not present

## 2017-06-12 DIAGNOSIS — M8580 Other specified disorders of bone density and structure, unspecified site: Secondary | ICD-10-CM

## 2017-06-12 DIAGNOSIS — Z01419 Encounter for gynecological examination (general) (routine) without abnormal findings: Secondary | ICD-10-CM

## 2017-06-12 DIAGNOSIS — N952 Postmenopausal atrophic vaginitis: Secondary | ICD-10-CM | POA: Diagnosis not present

## 2017-06-12 DIAGNOSIS — Z780 Asymptomatic menopausal state: Secondary | ICD-10-CM

## 2017-06-12 NOTE — Patient Instructions (Signed)
1. No Pap smear needed 2. Mammogram already completed this year 3. Stool guaiac cards are given for colon cancer screening 4. Continue with healthy eating and exercise 5. Recommend calcium with vitamin D supplementation 1200 mg a day 6. Return in 1 year for annual exam   Health Maintenance for Postmenopausal Women Menopause is a normal process in which your reproductive ability comes to an end. This process happens gradually over a span of months to years, usually between the ages of 39 and 21. Menopause is complete when you have missed 12 consecutive menstrual periods. It is important to talk with your health care provider about some of the most common conditions that affect postmenopausal women, such as heart disease, cancer, and bone loss (osteoporosis). Adopting a healthy lifestyle and getting preventive care can help to promote your health and wellness. Those actions can also lower your chances of developing some of these common conditions. What should I know about menopause? During menopause, you may experience a number of symptoms, such as:  Moderate-to-severe hot flashes.  Night sweats.  Decrease in sex drive.  Mood swings.  Headaches.  Tiredness.  Irritability.  Memory problems.  Insomnia.  Choosing to treat or not to treat menopausal changes is an individual decision that you make with your health care provider. What should I know about hormone replacement therapy and supplements? Hormone therapy products are effective for treating symptoms that are associated with menopause, such as hot flashes and night sweats. Hormone replacement carries certain risks, especially as you become older. If you are thinking about using estrogen or estrogen with progestin treatments, discuss the benefits and risks with your health care provider. What should I know about heart disease and stroke? Heart disease, heart attack, and stroke become more likely as you age. This may be due, in  part, to the hormonal changes that your body experiences during menopause. These can affect how your body processes dietary fats, triglycerides, and cholesterol. Heart attack and stroke are both medical emergencies. There are many things that you can do to help prevent heart disease and stroke:  Have your blood pressure checked at least every 1-2 years. High blood pressure causes heart disease and increases the risk of stroke.  If you are 36-70 years old, ask your health care provider if you should take aspirin to prevent a heart attack or a stroke.  Do not use any tobacco products, including cigarettes, chewing tobacco, or electronic cigarettes. If you need help quitting, ask your health care provider.  It is important to eat a healthy diet and maintain a healthy weight. ? Be sure to include plenty of vegetables, fruits, low-fat dairy products, and lean protein. ? Avoid eating foods that are high in solid fats, added sugars, or salt (sodium).  Get regular exercise. This is one of the most important things that you can do for your health. ? Try to exercise for at least 150 minutes each week. The type of exercise that you do should increase your heart rate and make you sweat. This is known as moderate-intensity exercise. ? Try to do strengthening exercises at least twice each week. Do these in addition to the moderate-intensity exercise.  Know your numbers.Ask your health care provider to check your cholesterol and your blood glucose. Continue to have your blood tested as directed by your health care provider.  What should I know about cancer screening? There are several types of cancer. Take the following steps to reduce your risk and to catch any  cancer development as early as possible. Breast Cancer  Practice breast self-awareness. ? This means understanding how your breasts normally appear and feel. ? It also means doing regular breast self-exams. Let your health care provider know about  any changes, no matter how small.  If you are 57 or older, have a clinician do a breast exam (clinical breast exam or CBE) every year. Depending on your age, family history, and medical history, it may be recommended that you also have a yearly breast X-ray (mammogram).  If you have a family history of breast cancer, talk with your health care provider about genetic screening.  If you are at high risk for breast cancer, talk with your health care provider about having an MRI and a mammogram every year.  Breast cancer (BRCA) gene test is recommended for women who have family members with BRCA-related cancers. Results of the assessment will determine the need for genetic counseling and BRCA1 and for BRCA2 testing. BRCA-related cancers include these types: ? Breast. This occurs in males or females. ? Ovarian. ? Tubal. This may also be called fallopian tube cancer. ? Cancer of the abdominal or pelvic lining (peritoneal cancer). ? Prostate. ? Pancreatic.  Cervical, Uterine, and Ovarian Cancer Your health care provider may recommend that you be screened regularly for cancer of the pelvic organs. These include your ovaries, uterus, and vagina. This screening involves a pelvic exam, which includes checking for microscopic changes to the surface of your cervix (Pap test).  For women ages 21-65, health care providers may recommend a pelvic exam and a Pap test every three years. For women ages 64-65, they may recommend the Pap test and pelvic exam, combined with testing for human papilloma virus (HPV), every five years. Some types of HPV increase your risk of cervical cancer. Testing for HPV may also be done on women of any age who have unclear Pap test results.  Other health care providers may not recommend any screening for nonpregnant women who are considered low risk for pelvic cancer and have no symptoms. Ask your health care provider if a screening pelvic exam is right for you.  If you have had  past treatment for cervical cancer or a condition that could lead to cancer, you need Pap tests and screening for cancer for at least 20 years after your treatment. If Pap tests have been discontinued for you, your risk factors (such as having a new sexual partner) need to be reassessed to determine if you should start having screenings again. Some women have medical problems that increase the chance of getting cervical cancer. In these cases, your health care provider may recommend that you have screening and Pap tests more often.  If you have a family history of uterine cancer or ovarian cancer, talk with your health care provider about genetic screening.  If you have vaginal bleeding after reaching menopause, tell your health care provider.  There are currently no reliable tests available to screen for ovarian cancer.  Lung Cancer Lung cancer screening is recommended for adults 50-66 years old who are at high risk for lung cancer because of a history of smoking. A yearly low-dose CT scan of the lungs is recommended if you:  Currently smoke.  Have a history of at least 30 pack-years of smoking and you currently smoke or have quit within the past 15 years. A pack-year is smoking an average of one pack of cigarettes per day for one year.  Yearly screening should:  Continue until  it has been 15 years since you quit.  Stop if you develop a health problem that would prevent you from having lung cancer treatment.  Colorectal Cancer  This type of cancer can be detected and can often be prevented.  Routine colorectal cancer screening usually begins at age 76 and continues through age 38.  If you have risk factors for colon cancer, your health care provider may recommend that you be screened at an earlier age.  If you have a family history of colorectal cancer, talk with your health care provider about genetic screening.  Your health care provider may also recommend using home test kits to  check for hidden blood in your stool.  A small camera at the end of a tube can be used to examine your colon directly (sigmoidoscopy or colonoscopy). This is done to check for the earliest forms of colorectal cancer.  Direct examination of the colon should be repeated every 5-10 years until age 61. However, if early forms of precancerous polyps or small growths are found or if you have a family history or genetic risk for colorectal cancer, you may need to be screened more often.  Skin Cancer  Check your skin from head to toe regularly.  Monitor any moles. Be sure to tell your health care provider: ? About any new moles or changes in moles, especially if there is a change in a mole's shape or color. ? If you have a mole that is larger than the size of a pencil eraser.  If any of your family members has a history of skin cancer, especially at a young age, talk with your health care provider about genetic screening.  Always use sunscreen. Apply sunscreen liberally and repeatedly throughout the day.  Whenever you are outside, protect yourself by wearing long sleeves, pants, a wide-brimmed hat, and sunglasses.  What should I know about osteoporosis? Osteoporosis is a condition in which bone destruction happens more quickly than new bone creation. After menopause, you may be at an increased risk for osteoporosis. To help prevent osteoporosis or the bone fractures that can happen because of osteoporosis, the following is recommended:  If you are 53-45 years old, get at least 1,000 mg of calcium and at least 600 mg of vitamin D per day.  If you are older than age 45 but younger than age 26, get at least 1,200 mg of calcium and at least 600 mg of vitamin D per day.  If you are older than age 20, get at least 1,200 mg of calcium and at least 800 mg of vitamin D per day.  Smoking and excessive alcohol intake increase the risk of osteoporosis. Eat foods that are rich in calcium and vitamin D, and  do weight-bearing exercises several times each week as directed by your health care provider. What should I know about how menopause affects my mental health? Depression may occur at any age, but it is more common as you become older. Common symptoms of depression include:  Low or sad mood.  Changes in sleep patterns.  Changes in appetite or eating patterns.  Feeling an overall lack of motivation or enjoyment of activities that you previously enjoyed.  Frequent crying spells.  Talk with your health care provider if you think that you are experiencing depression. What should I know about immunizations? It is important that you get and maintain your immunizations. These include:  Tetanus, diphtheria, and pertussis (Tdap) booster vaccine.  Influenza every year before the flu season  begins.  Pneumonia vaccine.  Shingles vaccine.  Your health care provider may also recommend other immunizations. This information is not intended to replace advice given to you by your health care provider. Make sure you discuss any questions you have with your health care provider. Document Released: 10/20/2005 Document Revised: 03/17/2016 Document Reviewed: 06/01/2015 Elsevier Interactive Patient Education  2018 [employer].

## 2017-06-25 ENCOUNTER — Ambulatory Visit (INDEPENDENT_AMBULATORY_CARE_PROVIDER_SITE_OTHER): Payer: PPO

## 2017-06-25 DIAGNOSIS — Z23 Encounter for immunization: Secondary | ICD-10-CM | POA: Diagnosis not present

## 2017-07-09 DIAGNOSIS — Z1211 Encounter for screening for malignant neoplasm of colon: Secondary | ICD-10-CM | POA: Diagnosis not present

## 2017-07-12 ENCOUNTER — Other Ambulatory Visit (INDEPENDENT_AMBULATORY_CARE_PROVIDER_SITE_OTHER): Payer: PPO

## 2017-07-12 DIAGNOSIS — I10 Essential (primary) hypertension: Secondary | ICD-10-CM | POA: Diagnosis not present

## 2017-07-12 DIAGNOSIS — E7800 Pure hypercholesterolemia, unspecified: Secondary | ICD-10-CM

## 2017-07-12 LAB — BASIC METABOLIC PANEL
BUN: 13 mg/dL (ref 6–23)
CALCIUM: 10.1 mg/dL (ref 8.4–10.5)
CO2: 31 mEq/L (ref 19–32)
CREATININE: 0.8 mg/dL (ref 0.40–1.20)
Chloride: 105 mEq/L (ref 96–112)
GFR: 76.24 mL/min (ref >60.00)
GLUCOSE: 85 mg/dL (ref 70–99)
Potassium: 3.7 mEq/L (ref 3.5–5.1)
Sodium: 141 mEq/L (ref 135–145)

## 2017-07-12 LAB — LIPID PANEL
CHOL/HDL RATIO: 2
Cholesterol: 99 mg/dL (ref 0–200)
HDL: 42.1 mg/dL (ref >39.00)
LDL CALC: 44 mg/dL (ref 0–99)
NonHDL: 56.83
TRIGLYCERIDES: 64 mg/dL (ref 0.0–149.0)
VLDL: 12.8 mg/dL (ref 0.0–40.0)

## 2017-07-12 LAB — CBC WITH DIFFERENTIAL/PLATELET
BASOS ABS: 0.1 10*3/uL (ref 0.0–0.1)
Basophils Relative: 1.2 % (ref 0.0–3.0)
EOS ABS: 0.1 10*3/uL (ref 0.0–0.7)
Eosinophils Relative: 1.4 % (ref 0.0–5.0)
HCT: 39.3 % (ref 36.0–46.0)
HEMOGLOBIN: 13.1 g/dL (ref 12.0–15.0)
LYMPHS PCT: 40.9 % (ref 12.0–46.0)
Lymphs Abs: 1.9 10*3/uL (ref 0.7–4.0)
MCHC: 33.4 g/dL (ref 30.0–36.0)
MCV: 90.3 fl (ref 78.0–100.0)
Monocytes Absolute: 0.5 10*3/uL (ref 0.1–1.0)
Monocytes Relative: 11.2 % (ref 3.0–12.0)
Neutro Abs: 2.1 10*3/uL (ref 1.4–7.7)
Neutrophils Relative %: 45.3 % (ref 43.0–77.0)
PLATELETS: 156 10*3/uL (ref 150.0–400.0)
RBC: 4.35 Mil/uL (ref 3.87–5.11)
RDW: 12.6 % (ref 11.5–15.5)
WBC: 4.7 10*3/uL (ref 4.0–10.5)

## 2017-07-12 LAB — HEPATIC FUNCTION PANEL
ALT: 16 U/L (ref 0–35)
AST: 16 U/L (ref 0–37)
Albumin: 4 g/dL (ref 3.5–5.2)
Alkaline Phosphatase: 40 U/L (ref 39–117)
BILIRUBIN DIRECT: 0.1 mg/dL (ref 0.0–0.3)
BILIRUBIN TOTAL: 0.6 mg/dL (ref 0.2–1.2)
Total Protein: 6.7 g/dL (ref 6.0–8.3)

## 2017-07-12 LAB — TSH: TSH: 3.22 u[IU]/mL (ref 0.35–4.50)

## 2017-07-13 ENCOUNTER — Encounter: Payer: Self-pay | Admitting: Internal Medicine

## 2017-07-17 ENCOUNTER — Ambulatory Visit (INDEPENDENT_AMBULATORY_CARE_PROVIDER_SITE_OTHER): Payer: PPO | Admitting: Internal Medicine

## 2017-07-17 ENCOUNTER — Encounter: Payer: Self-pay | Admitting: Internal Medicine

## 2017-07-17 DIAGNOSIS — I10 Essential (primary) hypertension: Secondary | ICD-10-CM | POA: Diagnosis not present

## 2017-07-17 DIAGNOSIS — I2510 Atherosclerotic heart disease of native coronary artery without angina pectoris: Secondary | ICD-10-CM | POA: Diagnosis not present

## 2017-07-17 DIAGNOSIS — E7800 Pure hypercholesterolemia, unspecified: Secondary | ICD-10-CM

## 2017-07-17 DIAGNOSIS — M8580 Other specified disorders of bone density and structure, unspecified site: Secondary | ICD-10-CM | POA: Diagnosis not present

## 2017-07-17 NOTE — Progress Notes (Signed)
[name], female   DOB: [birth year], 66 y.o.   MRN: [id number]   Subjective:    [name], female    DOB: 07/09/1951, 66 y.o.   MRN: [id number]  HPI  Patient here for a scheduled follow up.  States she is doing relatively well.  Saw Dr DeFrancesco for gyn exam 06/12/17.  Saw Dr Nehemiah Massed 06/11/17.  Stable.  Overall she feels she is doing relatively well.  No sob.  Tries to stay active.  Discussed bone density.  Discussed osteopenia.  Discussed weight bearing exercise and dietary calcium.  Information given.  Has f/u with dermatology tomorrow.  Blood pressures on outside checks averaging 110-120s/60s.     Past Medical History:  Diagnosis Date  . Acid reflux   . Coronary artery disease   . 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 Heart murmur   . History of chicken pox   . Hyperlipidemia   . Hypertension   . Insomnia    Past Surgical History:  Procedure Laterality Date  . APPENDECTOMY    . BREAST EXCISIONAL BIOPSY Right    negative over 5 years ago- neg  . BREAST SURGERY     Biopsy  . CORONARY ARTERY BYPASS GRAFT  05/01/2014   3 vessel, [facility]  . HYSTEROSCOPY W/D&C    . lipoma removed     removed from forehead  . triple bypass     Family History  Problem Relation Age of Onset  . Breast cancer Unknown        maternal great aunt  . Heart disease Unknown        multiple family members  . Heart disease Mother   . Heart disease Father   . Breast cancer Sister 22  . Colon cancer Neg Hx   . Diabetes Neg Hx   . Ovarian cancer Neg Hx    Social History   Socioeconomic History  . Marital status: Married    Spouse name: None  . Number of children: None  . Years of education: None  . Highest education level: None  Social Needs  . Financial resource strain: None  . Food insecurity - worry: None  . Food insecurity - inability: None  . Transportation needs - medical: None  . Transportation needs - non-medical: None    Occupational History  . None  Tobacco Use  . Smoking status: Never Smoker  . Smokeless tobacco: Never Used  Substance and Sexual Activity  . Alcohol use: Yes    Alcohol/week: 0.0 oz    Comment: socially - 1x/3 wks  . Drug use: No  . Sexual activity: Yes    Birth control/protection: Post-menopausal  Other Topics Concern  . None  Social History Narrative  . None    Outpatient Encounter Medications as of 07/17/2017  Medication Sig  . amLODipine (NORVASC) 10 MG tablet Take 10 mg by mouth daily.  Marland Kitchen aspirin EC 81 MG tablet Take 81 mg by mouth daily.  Marland Kitchen atorvastatin (LIPITOR) 20 MG tablet TAKE 1 TABLET (20 MG TOTAL) BY MOUTH ONCE DAILY.  Marland Kitchen Docusate Calcium (STOOL SOFTENER PO) Take by mouth.  . hydrochlorothiazide (HYDRODIURIL) 25 MG tablet TAKE 1 TABLET (25 MG TOTAL) BY MOUTH ONCE DAILY.  . metoprolol tartrate (LOPRESSOR) 25 MG tablet Take 25 mg by mouth 2 (two) times daily.  . Omega-3 Fatty Acids (FISH OIL) 1000 MG CAPS Take 1,200 mg by mouth daily.  . pantoprazole (  PROTONIX) 40 MG tablet Take 1 tablet (40 mg total) by mouth daily. Take 30 minutes before breakfast  . telmisartan (MICARDIS) 80 MG tablet TAKE 1 TABLET (80 MG TOTAL) BY MOUTH ONCE DAILY.  . traZODone (DESYREL) 50 MG tablet TAKE 1-2 TABLETS (50-100 MG TOTAL) BY MOUTH AT BEDTIME AS NEEDED.  Marland Kitchen VITAMIN D, ERGOCALCIFEROL, PO Take by mouth.   No facility-administered encounter medications on file as of 07/17/2017.     Review of Systems  Constitutional: Negative for appetite change and unexpected weight change.  HENT: Negative for congestion and sinus pressure.   Respiratory: Negative for cough, chest tightness and shortness of breath.   Cardiovascular: Negative for chest pain, palpitations and leg swelling.  Gastrointestinal: Negative for abdominal pain, diarrhea and nausea.  Genitourinary: Negative for difficulty urinating and dysuria.  Musculoskeletal: Negative for joint swelling and myalgias.  Skin: Negative for color  change and rash.  Neurological: Negative for dizziness, light-headedness and headaches.  Psychiatric/Behavioral: Negative for agitation and dysphoric mood.       Objective:    Physical Exam  Constitutional: She appears well-developed and well-nourished. No distress.  HENT:  Nose: Nose normal.  Mouth/Throat: Oropharynx is clear and moist.  Neck: Neck supple. No thyromegaly present.  Cardiovascular: Normal rate and regular rhythm.  Pulmonary/Chest: Breath sounds normal. No respiratory distress. She has no wheezes.  Abdominal: Soft. Bowel sounds are normal. There is no tenderness.  Musculoskeletal: She exhibits no edema or tenderness.  Lymphadenopathy:    She has no cervical adenopathy.  Skin: No rash noted. No erythema.  Psychiatric: She has a normal mood and affect. Her behavior is normal.    BP (!) 150/72 (BP Location: Left Arm, Patient Position: Sitting, Cuff Size: Normal)   Pulse 60   Temp 97.6 F (36.4 C) (Oral)   Resp 15   Ht 5' 10.87" (1.8 m)   Wt 172 lb 3.2 oz (78.1 kg)   SpO2 99%   BMI 24.11 kg/m  Wt Readings from Last 3 Encounters:  07/17/17 172 lb 3.2 oz (78.1 kg)  06/12/17 170 lb 6.4 oz (77.3 kg)  03/12/17 168 lb 12.8 oz (76.6 kg)     Lab Results  Component Value Date   WBC 4.7 07/12/2017   HGB 13.1 07/12/2017   HCT 39.3 07/12/2017   PLT 156.0 07/12/2017   GLUCOSE 85 07/12/2017   CHOL 99 07/12/2017   TRIG 64.0 07/12/2017   HDL 42.10 07/12/2017   LDLCALC 44 07/12/2017   ALT 16 07/12/2017   AST 16 07/12/2017   NA 141 07/12/2017   K 3.7 07/12/2017   CL 105 07/12/2017   CREATININE 0.80 07/12/2017   BUN 13 07/12/2017   CO2 31 07/12/2017   TSH 3.22 07/12/2017    Mm Digital Diagnostic Unilat R  Result Date: 05/24/2017 CLINICAL DATA:  Patient returns today to evaluate right breast calcifications identified on recent screening mammogram. History of benign right breast excisional biopsy. EXAM: DIGITAL DIAGNOSTIC RIGHT MAMMOGRAM WITH CAD COMPARISON:   Previous exams including recent screening mammogram dated 05/16/2017. ACR Breast Density Category b: There are scattered areas of fibroglandular density. FINDINGS: On today's additional views, including magnification views, loosely grouped punctate and coarse calcifications are confirmed within the upper-outer quadrant of the right breast, at the site of patient's previous excisional biopsy. On today's study, the calcifications do not appear significantly changed compared to older studies dating back to 2007 indicating benignity. No suspicious pleomorphic or linear branching calcifications identified. Mammographic images were processed with CAD.  IMPRESSION: No evidence of malignancy. Benign calcifications within the right breast. Patient may return to routine annual bilateral screening mammogram schedule. RECOMMENDATION: Screening mammogram in one year.(Code:SM-B-01Y) I have discussed the findings and recommendations with the patient. Results were also provided in writing at the conclusion of the visit. If applicable, a reminder letter will be sent to the patient regarding the next appointment. BI-RADS CATEGORY  2: Benign. Electronically Signed   By: Franki Cabot M.D.   On: 05/24/2017 10:53       Assessment & Plan:   Problem List Items Addressed This Visit    CAD (coronary artery disease)    Just saw cardiology.  Stable.  Continue risk factor modification.        Essential hypertension    Blood pressures on outside checks under good control.  Recheck here improved.  Have her to continue to spot check her pressure. No change in medication.  Follow pressures.  Follow metabolic panel.        Relevant Orders   Basic metabolic panel   Hypercholesterolemia    On lipitor.  Low cholesterol diet and exercise.  Follow lipid panel and liver function tests.        Relevant Orders   Hepatic function panel   Lipid panel   Osteopenia    Discussed with her today. Discussed bone density results and  treatment options.  Discussed dietary calcium and weight bearing exercise.  Information givne.           Einar Pheasant, MD

## 2017-07-18 ENCOUNTER — Encounter: Payer: Self-pay | Admitting: Internal Medicine

## 2017-07-18 DIAGNOSIS — D485 Neoplasm of uncertain behavior of skin: Secondary | ICD-10-CM | POA: Diagnosis not present

## 2017-07-18 DIAGNOSIS — L817 Pigmented purpuric dermatosis: Secondary | ICD-10-CM | POA: Diagnosis not present

## 2017-07-18 DIAGNOSIS — C44619 Basal cell carcinoma of skin of left upper limb, including shoulder: Secondary | ICD-10-CM | POA: Diagnosis not present

## 2017-07-18 DIAGNOSIS — Z08 Encounter for follow-up examination after completed treatment for malignant neoplasm: Secondary | ICD-10-CM | POA: Diagnosis not present

## 2017-07-18 DIAGNOSIS — Z85828 Personal history of other malignant neoplasm of skin: Secondary | ICD-10-CM | POA: Diagnosis not present

## 2017-07-18 DIAGNOSIS — C44519 Basal cell carcinoma of skin of other part of trunk: Secondary | ICD-10-CM | POA: Diagnosis not present

## 2017-07-18 LAB — FECAL OCCULT BLOOD, IMMUNOCHEMICAL: FECAL OCCULT BLD: NEGATIVE

## 2017-07-20 ENCOUNTER — Encounter: Payer: Self-pay | Admitting: Internal Medicine

## 2017-07-20 NOTE — Assessment & Plan Note (Signed)
Discussed with her today. Discussed bone density results and treatment options.  Discussed dietary calcium and weight bearing exercise.  Information givne.

## 2017-07-20 NOTE — Assessment & Plan Note (Signed)
On lipitor.  Low cholesterol diet and exercise.  Follow lipid panel and liver function tests.   

## 2017-07-20 NOTE — Assessment & Plan Note (Signed)
Blood pressures on outside checks under good control.  Recheck here improved.  Have her to continue to spot check her pressure. No change in medication.  Follow pressures.  Follow metabolic panel.

## 2017-07-20 NOTE — Assessment & Plan Note (Signed)
Just saw cardiology.  Stable.  Continue risk factor modification.

## 2017-09-24 ENCOUNTER — Encounter: Payer: Self-pay | Admitting: Internal Medicine

## 2017-10-20 ENCOUNTER — Other Ambulatory Visit: Payer: Self-pay | Admitting: Internal Medicine

## 2017-10-26 ENCOUNTER — Other Ambulatory Visit: Payer: Self-pay | Admitting: Internal Medicine

## 2017-11-26 ENCOUNTER — Other Ambulatory Visit (INDEPENDENT_AMBULATORY_CARE_PROVIDER_SITE_OTHER): Payer: PPO

## 2017-11-26 DIAGNOSIS — I10 Essential (primary) hypertension: Secondary | ICD-10-CM | POA: Diagnosis not present

## 2017-11-26 DIAGNOSIS — E7800 Pure hypercholesterolemia, unspecified: Secondary | ICD-10-CM

## 2017-11-26 LAB — BASIC METABOLIC PANEL
BUN: 14 mg/dL (ref 6–23)
CO2: 31 mEq/L (ref 19–32)
CREATININE: 0.8 mg/dL (ref 0.40–1.20)
Calcium: 10.4 mg/dL (ref 8.4–10.5)
Chloride: 103 mEq/L (ref 96–112)
GFR: 76.16 mL/min (ref >60.00)
Glucose, Bld: 92 mg/dL (ref 70–99)
POTASSIUM: 4.2 meq/L (ref 3.5–5.1)
Sodium: 141 mEq/L (ref 135–145)

## 2017-11-26 LAB — LIPID PANEL
CHOLESTEROL: 100 mg/dL (ref 0–200)
HDL: 36.4 mg/dL — ABNORMAL LOW (ref >39.00)
LDL Cholesterol: 45 mg/dL (ref 0–99)
NonHDL: 63.83
TRIGLYCERIDES: 96 mg/dL (ref 0.0–149.0)
Total CHOL/HDL Ratio: 3
VLDL: 19.2 mg/dL (ref 0.0–40.0)

## 2017-11-26 LAB — HEPATIC FUNCTION PANEL
ALT: 15 U/L (ref 0–35)
AST: 14 U/L (ref 0–37)
Albumin: 4.2 g/dL (ref 3.5–5.2)
Alkaline Phosphatase: 43 U/L (ref 39–117)
BILIRUBIN DIRECT: 0.1 mg/dL (ref 0.0–0.3)
BILIRUBIN TOTAL: 0.6 mg/dL (ref 0.2–1.2)
TOTAL PROTEIN: 7.2 g/dL (ref 6.0–8.3)

## 2017-11-27 ENCOUNTER — Encounter: Payer: Self-pay | Admitting: Internal Medicine

## 2017-11-28 ENCOUNTER — Encounter: Payer: Self-pay | Admitting: Internal Medicine

## 2017-11-28 ENCOUNTER — Ambulatory Visit (INDEPENDENT_AMBULATORY_CARE_PROVIDER_SITE_OTHER): Payer: PPO | Admitting: Internal Medicine

## 2017-11-28 VITALS — BP 138/78 | HR 62 | Temp 98.7°F | Resp 18 | Wt 172.2 lb

## 2017-11-28 DIAGNOSIS — J069 Acute upper respiratory infection, unspecified: Secondary | ICD-10-CM

## 2017-11-28 DIAGNOSIS — I2510 Atherosclerotic heart disease of native coronary artery without angina pectoris: Secondary | ICD-10-CM

## 2017-11-28 DIAGNOSIS — I10 Essential (primary) hypertension: Secondary | ICD-10-CM

## 2017-11-28 DIAGNOSIS — E7800 Pure hypercholesterolemia, unspecified: Secondary | ICD-10-CM | POA: Diagnosis not present

## 2017-11-28 DIAGNOSIS — K2100 Gastro-esophageal reflux disease with esophagitis, without bleeding: Secondary | ICD-10-CM

## 2017-11-28 NOTE — Progress Notes (Signed)
[name], female   DOB: May 05, 1951, 67 y.o.   MRN: [id number]   Subjective:    [name], female    DOB: 27-Sep-1950, 67 y.o.   MRN: [id number]  HPI  Patient here for a scheduled follow up.  She reports she is doing relatively well.  Over the last two weeks, had some congestion and cough.  Is better.  She has been taking claritin and elderberry.  Still with some intermittent throat congestion.  No sinus pressure.  No fever.  No chest congestion.  No sob.  No chest pain.  No acid reflux.  No abdominal pain. Bowels moving.     Past Medical History:  Diagnosis Date  . Acid reflux   . Coronary artery disease   . 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 Heart murmur   . History of chicken pox   . Hyperlipidemia   . Hypertension   . Insomnia    Past Surgical History:  Procedure Laterality Date  . APPENDECTOMY    . BREAST EXCISIONAL BIOPSY Right    negative over 5 years ago- neg  . BREAST SURGERY     Biopsy  . COLONOSCOPY WITH PROPOFOL N/A 05/16/2016   Procedure: COLONOSCOPY WITH PROPOFOL;  Surgeon: [name], MD;  Location: [facility];  Service: Endoscopy;  Laterality: N/A;  . CORONARY ARTERY BYPASS GRAFT  05/01/2014   3 vessel, [facility]  . ESOPHAGOGASTRODUODENOSCOPY (EGD) WITH PROPOFOL N/A 02/23/2017   Procedure: ESOPHAGOGASTRODUODENOSCOPY (EGD) WITH PROPOFOL;  Surgeon: [name], MD;  Location: [place];  Service: Endoscopy;  Laterality: N/A;  . HYSTEROSCOPY W/D&C    . lipoma removed     removed from forehead  . triple bypass     Family History  Problem Relation Age of Onset  . Breast cancer Unknown        maternal great aunt  . Heart disease Unknown        multiple family members  . Heart disease Mother   . Heart disease Father   . Breast cancer Sister 37  . Colon cancer Neg Hx   . Diabetes Neg Hx   . Ovarian cancer Neg Hx    Social History   Socioeconomic History  . Marital status: Married   Spouse name: Not on file  . Number of children: Not on file  . Years of education: Not on file  . Highest education level: Not on file  Occupational History  . Not on file  Social Needs  . Financial resource strain: Not on file  . Food insecurity:    Worry: Not on file    Inability: Not on file  . Transportation needs:    Medical: Not on file    Non-medical: Not on file  Tobacco Use  . Smoking status: Never Smoker  . Smokeless tobacco: Never Used  Substance and Sexual Activity  . Alcohol use: Yes    Alcohol/week: 0.0 oz    Comment: socially - 1x/3 wks  . Drug use: No  . Sexual activity: Yes    Birth control/protection: Post-menopausal  Lifestyle  . Physical activity:    Days per week: Not on file    Minutes per session: Not on file  . Stress: Not on file  Relationships  . Social connections:    Talks on phone: Not on file    Gets together: Not on file    Attends religious service: Not on file  Active member of club or organization: Not on file    Attends meetings of clubs or organizations: Not on file    Relationship status: Not on file  Other Topics Concern  . Not on file  Social History Narrative  . Not on file    Outpatient Encounter Medications as of 11/28/2017  Medication Sig  . amLODipine (NORVASC) 10 MG tablet Take 10 mg by mouth daily.  Marland Kitchen aspirin EC 81 MG tablet Take 81 mg by mouth daily.  Marland Kitchen atorvastatin (LIPITOR) 20 MG tablet TAKE 1 TABLET (20 MG TOTAL) BY MOUTH ONCE DAILY.  Marland Kitchen Docusate Calcium (STOOL SOFTENER PO) Take by mouth.  . hydrochlorothiazide (HYDRODIURIL) 25 MG tablet TAKE 1 TABLET (25 MG TOTAL) BY MOUTH ONCE DAILY.  . metoprolol tartrate (LOPRESSOR) 25 MG tablet Take 25 mg by mouth 2 (two) times daily.  . Omega-3 Fatty Acids (FISH OIL) 1000 MG CAPS Take 1,200 mg by mouth daily.  . pantoprazole (PROTONIX) 40 MG tablet TAKE 1 TABLET (40 MG TOTAL) BY MOUTH DAILY. TAKE 30 MINUTES BEFORE BREAKFAST  . telmisartan (MICARDIS) 80 MG tablet TAKE 1 TABLET  (80 MG TOTAL) BY MOUTH ONCE DAILY.  . traZODone (DESYREL) 50 MG tablet TAKE 1-2 TABLETS (50-100 MG TOTAL) BY MOUTH AT BEDTIME AS NEEDED.  Marland Kitchen VITAMIN D, ERGOCALCIFEROL, PO Take by mouth.   No facility-administered encounter medications on file as of 11/28/2017.     Review of Systems  Constitutional: Negative for appetite change, fever and unexpected weight change.  HENT: Positive for congestion and postnasal drip. Negative for sinus pressure.   Respiratory: Negative for chest tightness and shortness of breath.        Cough is better.    Cardiovascular: Negative for chest pain, palpitations and leg swelling.  Gastrointestinal: Negative for abdominal pain, diarrhea, nausea and vomiting.  Genitourinary: Negative for difficulty urinating and dysuria.  Musculoskeletal: Negative for joint swelling and myalgias.  Skin: Negative for color change and rash.  Neurological: Negative for dizziness, light-headedness and headaches.  Psychiatric/Behavioral: Negative for agitation and dysphoric mood.       Objective:    Physical Exam  Constitutional: She appears well-developed and well-nourished. No distress.  HENT:  Nose: Nose normal.  Mouth/Throat: Oropharynx is clear and moist.  Neck: Neck supple. No thyromegaly present.  Cardiovascular: Normal rate and regular rhythm.  Pulmonary/Chest: Breath sounds normal. No respiratory distress. She has no wheezes.  Abdominal: Soft. Bowel sounds are normal. There is no tenderness.  Musculoskeletal: She exhibits no edema or tenderness.  Lymphadenopathy:    She has no cervical adenopathy.  Skin: No rash noted. No erythema.  Psychiatric: She has a normal mood and affect. Her behavior is normal.    BP 138/78 (BP Location: Left Arm, Patient Position: Sitting, Cuff Size: Large)   Pulse 62   Temp 98.7 F (37.1 C) (Oral)   Resp 18   Wt 172 lb 3.2 oz (78.1 kg)   SpO2 99%   BMI 24.11 kg/m  Wt Readings from Last 3 Encounters:  11/28/17 172 lb 3.2 oz (78.1  kg)  07/17/17 172 lb 3.2 oz (78.1 kg)  06/12/17 170 lb 6.4 oz (77.3 kg)     Lab Results  Component Value Date   WBC 4.7 07/12/2017   HGB 13.1 07/12/2017   HCT 39.3 07/12/2017   PLT 156.0 07/12/2017   GLUCOSE 92 11/26/2017   CHOL 100 11/26/2017   TRIG 96.0 11/26/2017   HDL 36.40 (L) 11/26/2017   LDLCALC 45 11/26/2017  ALT 15 11/26/2017   AST 14 11/26/2017   NA 141 11/26/2017   K 4.2 11/26/2017   CL 103 11/26/2017   CREATININE 0.80 11/26/2017   BUN 14 11/26/2017   CO2 31 11/26/2017   TSH 3.22 07/12/2017    Mm Digital Diagnostic Unilat R  Result Date: 05/24/2017 CLINICAL DATA:  Patient returns today to evaluate right breast calcifications identified on recent screening mammogram. History of benign right breast excisional biopsy. EXAM: DIGITAL DIAGNOSTIC RIGHT MAMMOGRAM WITH CAD COMPARISON:  Previous exams including recent screening mammogram dated 05/16/2017. ACR Breast Density Category b: There are scattered areas of fibroglandular density. FINDINGS: On today's additional views, including magnification views, loosely grouped punctate and coarse calcifications are confirmed within the upper-outer quadrant of the right breast, at the site of patient's previous excisional biopsy. On today's study, the calcifications do not appear significantly changed compared to older studies dating back to 2007 indicating benignity. No suspicious pleomorphic or linear branching calcifications identified. Mammographic images were processed with CAD. IMPRESSION: No evidence of malignancy. Benign calcifications within the right breast. Patient may return to routine annual bilateral screening mammogram schedule. RECOMMENDATION: Screening mammogram in one year.(Code:SM-B-01Y) I have discussed the findings and recommendations with the patient. Results were also provided in writing at the conclusion of the visit. If applicable, a reminder letter will be sent to the patient regarding the next appointment. BI-RADS  CATEGORY  2: Benign. Electronically Signed   By: Franki Cabot M.D.   On: 05/24/2017 10:53       Assessment & Plan:   Problem List Items Addressed This Visit    CAD (coronary artery disease)    Followed by cardiology.  Stable.  Continue risk factor modification.  Follow.       Esophagitis, reflux    Controlled on protonix.        Essential hypertension    Blood pressure has been under good control.  Follow pressures.  Follow metabolic panel.        Relevant Orders   Basic metabolic panel   Hypercholesterolemia    On lipitor.  Low cholesterol diet and exercise.  Follow lipid panel and liver function tests.        Relevant Orders   Hepatic function panel   Lipid panel    Other Visit Diagnoses    Upper respiratory tract infection, unspecified type    -  Primary   Symptoms improved.  Cough better.  Robitussin DM and nasacort nasal spray as directed.  Follow.         Einar Pheasant, MD

## 2017-11-28 NOTE — Patient Instructions (Signed)
Robitussin DM can take twice a day as needed for cough and congestion  nasacort nasal spray - 2 sprays each nostril one time per day.  Do this in the evening.

## 2017-12-01 ENCOUNTER — Encounter: Payer: Self-pay | Admitting: Internal Medicine

## 2017-12-01 NOTE — Assessment & Plan Note (Signed)
Controlled on protonix.   

## 2017-12-01 NOTE — Assessment & Plan Note (Signed)
Followed by cardiology.  Stable.  Continue risk factor modification.  Follow.

## 2017-12-01 NOTE — Assessment & Plan Note (Signed)
Blood pressure has been under good control.  Follow pressures.  Follow metabolic panel.  

## 2017-12-01 NOTE — Assessment & Plan Note (Signed)
On lipitor.  Low cholesterol diet and exercise.  Follow lipid panel and liver function tests.   

## 2017-12-13 DIAGNOSIS — Z85828 Personal history of other malignant neoplasm of skin: Secondary | ICD-10-CM | POA: Diagnosis not present

## 2017-12-13 DIAGNOSIS — D2262 Melanocytic nevi of left upper limb, including shoulder: Secondary | ICD-10-CM | POA: Diagnosis not present

## 2017-12-13 DIAGNOSIS — Z08 Encounter for follow-up examination after completed treatment for malignant neoplasm: Secondary | ICD-10-CM | POA: Diagnosis not present

## 2017-12-13 DIAGNOSIS — C44519 Basal cell carcinoma of skin of other part of trunk: Secondary | ICD-10-CM | POA: Diagnosis not present

## 2017-12-13 DIAGNOSIS — D485 Neoplasm of uncertain behavior of skin: Secondary | ICD-10-CM | POA: Diagnosis not present

## 2017-12-13 DIAGNOSIS — D2261 Melanocytic nevi of right upper limb, including shoulder: Secondary | ICD-10-CM | POA: Diagnosis not present

## 2017-12-13 DIAGNOSIS — L817 Pigmented purpuric dermatosis: Secondary | ICD-10-CM | POA: Diagnosis not present

## 2017-12-13 DIAGNOSIS — L821 Other seborrheic keratosis: Secondary | ICD-10-CM | POA: Diagnosis not present

## 2017-12-13 DIAGNOSIS — D225 Melanocytic nevi of trunk: Secondary | ICD-10-CM | POA: Diagnosis not present

## 2017-12-13 DIAGNOSIS — D2272 Melanocytic nevi of left lower limb, including hip: Secondary | ICD-10-CM | POA: Diagnosis not present

## 2017-12-13 DIAGNOSIS — L820 Inflamed seborrheic keratosis: Secondary | ICD-10-CM | POA: Diagnosis not present

## 2017-12-13 DIAGNOSIS — D2271 Melanocytic nevi of right lower limb, including hip: Secondary | ICD-10-CM | POA: Diagnosis not present

## 2017-12-24 DIAGNOSIS — R001 Bradycardia, unspecified: Secondary | ICD-10-CM | POA: Diagnosis not present

## 2017-12-24 DIAGNOSIS — I10 Essential (primary) hypertension: Secondary | ICD-10-CM | POA: Diagnosis not present

## 2017-12-24 DIAGNOSIS — I25810 Atherosclerosis of coronary artery bypass graft(s) without angina pectoris: Secondary | ICD-10-CM | POA: Diagnosis not present

## 2017-12-24 DIAGNOSIS — E782 Mixed hyperlipidemia: Secondary | ICD-10-CM | POA: Diagnosis not present

## 2018-01-16 DIAGNOSIS — C44519 Basal cell carcinoma of skin of other part of trunk: Secondary | ICD-10-CM | POA: Diagnosis not present

## 2018-02-15 ENCOUNTER — Encounter: Payer: Self-pay | Admitting: Internal Medicine

## 2018-02-15 ENCOUNTER — Ambulatory Visit (INDEPENDENT_AMBULATORY_CARE_PROVIDER_SITE_OTHER): Payer: PPO | Admitting: Internal Medicine

## 2018-02-15 DIAGNOSIS — Z8739 Personal history of other diseases of the musculoskeletal system and connective tissue: Secondary | ICD-10-CM

## 2018-02-15 DIAGNOSIS — I10 Essential (primary) hypertension: Secondary | ICD-10-CM

## 2018-02-15 DIAGNOSIS — E7800 Pure hypercholesterolemia, unspecified: Secondary | ICD-10-CM | POA: Diagnosis not present

## 2018-02-15 DIAGNOSIS — G629 Polyneuropathy, unspecified: Secondary | ICD-10-CM | POA: Insufficient documentation

## 2018-02-15 LAB — TSH: TSH: 3.45 u[IU]/mL (ref 0.35–4.50)

## 2018-02-15 LAB — CBC WITH DIFFERENTIAL/PLATELET
Basophils Absolute: 0.1 10*3/uL (ref 0.0–0.1)
Basophils Relative: 1.7 % (ref 0.0–3.0)
EOS PCT: 3.1 % (ref 0.0–5.0)
Eosinophils Absolute: 0.2 10*3/uL (ref 0.0–0.7)
HEMATOCRIT: 39.4 % (ref 36.0–46.0)
Hemoglobin: 13.4 g/dL (ref 12.0–15.0)
LYMPHS ABS: 2.2 10*3/uL (ref 0.7–4.0)
LYMPHS PCT: 38.1 % (ref 12.0–46.0)
MCHC: 34 g/dL (ref 30.0–36.0)
MCV: 89.5 fl (ref 78.0–100.0)
MONOS PCT: 12 % (ref 3.0–12.0)
Monocytes Absolute: 0.7 10*3/uL (ref 0.1–1.0)
NEUTROS PCT: 45.1 % (ref 43.0–77.0)
Neutro Abs: 2.7 10*3/uL (ref 1.4–7.7)
Platelets: 176 10*3/uL (ref 150.0–400.0)
RBC: 4.41 Mil/uL (ref 3.87–5.11)
RDW: 13.3 % (ref 11.5–15.5)
WBC: 5.9 10*3/uL (ref 4.0–10.5)

## 2018-02-15 LAB — VITAMIN B12: Vitamin B-12: 291 pg/mL (ref 211–911)

## 2018-02-15 MED ORDER — GABAPENTIN 100 MG PO CAPS: ORAL_CAPSULE | ORAL | 1 refills | Status: DC

## 2018-02-15 NOTE — Progress Notes (Signed)
Subjective:    [name], female    DOB: 1951-01-02, 67 y.o.   MRN: [id number]  HPI  Patient here as a work in with concerns regarding leg pain.   She reports pain has been present (intermittently) for a few years.  Overall the last 6 months has been more constant.  She has had MRI previously for similar symptoms.  This revealed multilevel degenerative disc and facet disease with mild canal stenosis at L4-5.  Also noted, small intraspinal synovial cyst at L5-S1.  She describes the pain as a burning pain.  No actual numbness or weakness.  Walking does not bother her.  Worse in the evening.  No back pain.  States if she can ly on her stomach, this relieves the pain.  Using heating pad and tylenol.  No bowel or bladder change reported.     Past Medical History:  Diagnosis Date  . Acid reflux   . Coronary artery disease   . 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 Heart murmur   . History of chicken pox   . Hyperlipidemia   . Hypertension   . Insomnia    Past Surgical History:  Procedure Laterality Date  . APPENDECTOMY    . BREAST EXCISIONAL BIOPSY Right    negative over 5 years ago- neg  . BREAST SURGERY     Biopsy  . COLONOSCOPY WITH PROPOFOL N/A 05/16/2016   Procedure: COLONOSCOPY WITH PROPOFOL;  Surgeon: [name], MD;  Location: [facility] ENDOSCOPY;  Service: Endoscopy;  Laterality: N/A;  . CORONARY ARTERY BYPASS GRAFT  05/01/2014   3 vessel, [facility]  . ESOPHAGOGASTRODUODENOSCOPY (EGD) WITH PROPOFOL N/A 02/23/2017   Procedure: ESOPHAGOGASTRODUODENOSCOPY (EGD) WITH PROPOFOL;  Surgeon: [name], MD;  Location: [place];  Service: Endoscopy;  Laterality: N/A;  . HYSTEROSCOPY W/D&C    . lipoma removed     removed from forehead  . triple bypass     Family History  Problem Relation Age of Onset  . Breast cancer Unknown        maternal great aunt  . Heart disease Unknown        multiple family members  . Heart disease Mother   .  Heart disease Father   . Breast cancer Sister 59  . Colon cancer Neg Hx   . Diabetes Neg Hx   . Ovarian cancer Neg Hx    Social History   Socioeconomic History  . Marital status: Married    Spouse name: Not on file  . Number of children: Not on file  . Years of education: Not on file  . Highest education level: Not on file  Occupational History  . Not on file  Social Needs  . Financial resource strain: Not on file  . Food insecurity:    Worry: Not on file    Inability: Not on file  . Transportation needs:    Medical: Not on file    Non-medical: Not on file  Tobacco Use  . Smoking status: Never Smoker  . Smokeless tobacco: Never Used  Substance and Sexual Activity  . Alcohol use: Yes    Alcohol/week: 0.0 oz    Comment: socially - 1x/3 wks  . Drug use: No  . Sexual activity: Yes    Birth control/protection: Post-menopausal  Lifestyle  . Physical activity:    Days per week: Not on file    Minutes per session: Not on file  .  Stress: Not on file  Relationships  . Social connections:    Talks on phone: Not on file    Gets together: Not on file    Attends religious service: Not on file    Active member of club or organization: Not on file    Attends meetings of clubs or organizations: Not on file    Relationship status: Not on file  Other Topics Concern  . Not on file  Social History Narrative  . Not on file    Outpatient Encounter Medications as of 02/15/2018  Medication Sig  . amLODipine (NORVASC) 10 MG tablet Take 10 mg by mouth daily.  Marland Kitchen aspirin EC 81 MG tablet Take 81 mg by mouth daily.  Marland Kitchen atorvastatin (LIPITOR) 20 MG tablet TAKE 1 TABLET (20 MG TOTAL) BY MOUTH ONCE DAILY.  Marland Kitchen Docusate Calcium (STOOL SOFTENER PO) Take by mouth.  . gabapentin (NEURONTIN) 100 MG capsule Take 1-2 capsules daily in the evening.  . hydrochlorothiazide (HYDRODIURIL) 25 MG tablet TAKE 1 TABLET (25 MG TOTAL) BY MOUTH ONCE DAILY.  . metoprolol tartrate (LOPRESSOR) 25 MG tablet Take 25  mg by mouth 2 (two) times daily.  . Omega-3 Fatty Acids (FISH OIL) 1000 MG CAPS Take 1,200 mg by mouth daily.  . pantoprazole (PROTONIX) 40 MG tablet TAKE 1 TABLET (40 MG TOTAL) BY MOUTH DAILY. TAKE 30 MINUTES BEFORE BREAKFAST  . telmisartan (MICARDIS) 80 MG tablet TAKE 1 TABLET (80 MG TOTAL) BY MOUTH ONCE DAILY.  . traZODone (DESYREL) 50 MG tablet TAKE 1-2 TABLETS (50-100 MG TOTAL) BY MOUTH AT BEDTIME AS NEEDED.  Marland Kitchen VITAMIN D, ERGOCALCIFEROL, PO Take by mouth.   No facility-administered encounter medications on file as of 02/15/2018.     Review of Systems  Constitutional: Negative for appetite change and unexpected weight change.  Respiratory: Negative for chest tightness and shortness of breath.   Cardiovascular: Negative for chest pain and leg swelling.  Gastrointestinal: Negative for diarrhea, nausea and vomiting.  Genitourinary: Negative for difficulty urinating.  Musculoskeletal: Negative for back pain and myalgias.       Burning pain as outlined.    Skin: Negative for color change and rash.  Neurological: Negative for dizziness, light-headedness and headaches.  Psychiatric/Behavioral: Negative for agitation and dysphoric mood.       Objective:    Physical Exam  Constitutional: She appears well-developed and well-nourished. No distress.  Neck: Neck supple.  Cardiovascular: Normal rate and regular rhythm.  Pulmonary/Chest: Breath sounds normal. No respiratory distress. She has no wheezes.  Abdominal: Soft. Bowel sounds are normal. There is no tenderness.  Musculoskeletal: She exhibits no edema or tenderness.  Motor strength equal bilateral lower extremities.    Lymphadenopathy:    She has no cervical adenopathy.  Skin: No rash noted. No erythema.  Psychiatric: She has a normal mood and affect. Her behavior is normal.    BP 136/72 (BP Location: Left Arm, Patient Position: Sitting, Cuff Size: Normal)   Pulse 69   Temp 98.3 F (36.8 C) (Oral)   Resp 16   Wt 172 lb 6.4 oz  (78.2 kg)   SpO2 98%   BMI 24.14 kg/m  Wt Readings from Last 3 Encounters:  02/15/18 172 lb 6.4 oz (78.2 kg)  11/28/17 172 lb 3.2 oz (78.1 kg)  07/17/17 172 lb 3.2 oz (78.1 kg)     Lab Results  Component Value Date   WBC 5.9 02/15/2018   HGB 13.4 02/15/2018   HCT 39.4 02/15/2018   PLT 176.0  02/15/2018   GLUCOSE 92 11/26/2017   CHOL 100 11/26/2017   TRIG 96.0 11/26/2017   HDL 36.40 (L) 11/26/2017   LDLCALC 45 11/26/2017   ALT 15 11/26/2017   AST 14 11/26/2017   NA 141 11/26/2017   K 4.2 11/26/2017   CL 103 11/26/2017   CREATININE 0.80 11/26/2017   BUN 14 11/26/2017   CO2 31 11/26/2017   TSH 3.45 02/15/2018    Mm Digital Diagnostic Unilat R  Result Date: 05/24/2017 CLINICAL DATA:  Patient returns today to evaluate right breast calcifications identified on recent screening mammogram. History of benign right breast excisional biopsy. EXAM: DIGITAL DIAGNOSTIC RIGHT MAMMOGRAM WITH CAD COMPARISON:  Previous exams including recent screening mammogram dated 05/16/2017. ACR Breast Density Category b: There are scattered areas of fibroglandular density. FINDINGS: On today's additional views, including magnification views, loosely grouped punctate and coarse calcifications are confirmed within the upper-outer quadrant of the right breast, at the site of patient's previous excisional biopsy. On today's study, the calcifications do not appear significantly changed compared to older studies dating back to 2007 indicating benignity. No suspicious pleomorphic or linear branching calcifications identified. Mammographic images were processed with CAD. IMPRESSION: No evidence of malignancy. Benign calcifications within the right breast. Patient may return to routine annual bilateral screening mammogram schedule. RECOMMENDATION: Screening mammogram in one year.(Code:SM-B-01Y) I have discussed the findings and recommendations with the patient. Results were also provided in writing at the conclusion of  the visit. If applicable, a reminder letter will be sent to the patient regarding the next appointment. BI-RADS CATEGORY  2: Benign. Electronically Signed   By: Franki Cabot M.D.   On: 05/24/2017 10:53       Assessment & Plan:   Problem List Items Addressed This Visit    Essential hypertension    Blood pressure under good control.  Continue same medication regimen.  Follow pressures.  Follow metabolic panel.        History of burning pain in leg    Describes the burning pain in her legs.  Has been present for a while, but has worsened over the last 6 months.  Had MRI previously as outlined.  Discussed further w/up.  Will schedule her for NCS.  May need f/u MRI.  Start with NCS and further w/up pending results.  Check B12.        Relevant Orders   Ambulatory referral to Neurology   CBC with Differential/Platelet (Completed)   TSH (Completed)   Vitamin B12 (Completed)   Hypercholesterolemia    On lipitor.  Low cholesterol diet and exercise.  Follow lipid panel and liver function tests.  No problems with lipitor tolerance.            Einar Pheasant, MD

## 2018-02-18 ENCOUNTER — Encounter: Payer: Self-pay | Admitting: Internal Medicine

## 2018-02-18 NOTE — Assessment & Plan Note (Signed)
Describes the burning pain in her legs.  Has been present for a while, but has worsened over the last 6 months.  Had MRI previously as outlined.  Discussed further w/up.  Will schedule her for NCS.  May need f/u MRI.  Start with NCS and further w/up pending results.  Check B12.

## 2018-02-18 NOTE — Assessment & Plan Note (Signed)
Blood pressure under good control.  Continue same medication regimen.  Follow pressures.  Follow metabolic panel.   

## 2018-02-18 NOTE — Assessment & Plan Note (Signed)
On lipitor.  Low cholesterol diet and exercise.  Follow lipid panel and liver function tests.  No problems with lipitor tolerance.

## 2018-02-20 ENCOUNTER — Ambulatory Visit (INDEPENDENT_AMBULATORY_CARE_PROVIDER_SITE_OTHER): Payer: PPO | Admitting: *Deleted

## 2018-02-20 DIAGNOSIS — E538 Deficiency of other specified B group vitamins: Secondary | ICD-10-CM | POA: Diagnosis not present

## 2018-02-20 MED ORDER — CYANOCOBALAMIN 1000 MCG/ML IJ SOLN
1000.0000 ug | Freq: Once | INTRAMUSCULAR | Status: AC
  Administered 2018-02-20: 1000 ug via INTRAMUSCULAR

## 2018-02-20 NOTE — Progress Notes (Signed)
Patient presented for B 12 injection to left deltoid, patient voiced no concerns nor showed any signs of distress during injection   First of four weekly injections

## 2018-02-27 ENCOUNTER — Ambulatory Visit (INDEPENDENT_AMBULATORY_CARE_PROVIDER_SITE_OTHER): Payer: PPO | Admitting: *Deleted

## 2018-02-27 DIAGNOSIS — E538 Deficiency of other specified B group vitamins: Secondary | ICD-10-CM | POA: Diagnosis not present

## 2018-02-27 MED ORDER — CYANOCOBALAMIN 1000 MCG/ML IJ SOLN
1000.0000 ug | Freq: Once | INTRAMUSCULAR | Status: AC
  Administered 2018-02-27: 1000 ug via INTRAMUSCULAR

## 2018-02-27 NOTE — Progress Notes (Signed)
Patient presented for B 12 injection to right deltoid, patient voiced no concerns nor showed any signs of distress during injection. 

## 2018-03-03 NOTE — Progress Notes (Signed)
  I have reviewed the above information and agree with above.   Tishanna Dunford, MD 

## 2018-03-05 DIAGNOSIS — R208 Other disturbances of skin sensation: Secondary | ICD-10-CM | POA: Diagnosis not present

## 2018-03-05 DIAGNOSIS — M79605 Pain in left leg: Secondary | ICD-10-CM | POA: Diagnosis not present

## 2018-03-05 DIAGNOSIS — M79671 Pain in right foot: Secondary | ICD-10-CM | POA: Diagnosis not present

## 2018-03-05 DIAGNOSIS — M79604 Pain in right leg: Secondary | ICD-10-CM | POA: Diagnosis not present

## 2018-03-05 DIAGNOSIS — M79672 Pain in left foot: Secondary | ICD-10-CM | POA: Diagnosis not present

## 2018-03-06 ENCOUNTER — Ambulatory Visit (INDEPENDENT_AMBULATORY_CARE_PROVIDER_SITE_OTHER): Payer: PPO

## 2018-03-06 DIAGNOSIS — E538 Deficiency of other specified B group vitamins: Secondary | ICD-10-CM

## 2018-03-06 MED ORDER — CYANOCOBALAMIN 1000 MCG/ML IJ SOLN
1000.0000 ug | Freq: Once | INTRAMUSCULAR | Status: AC
  Administered 2018-03-06: 1000 ug via INTRAMUSCULAR

## 2018-03-06 MED ORDER — CYANOCOBALAMIN 1000 MCG/ML IJ SOLN: 1000.0000 ug | Freq: Once | INTRAMUSCULAR | 0 refills | Status: DC

## 2018-03-06 NOTE — Progress Notes (Addendum)
Patient comes in today for a vitamin B 12 injection. Administered B 12 in left deltoid IM. Patient tolerated well.  Reviewed.  Dr Nicki Reaper

## 2018-03-07 ENCOUNTER — Encounter: Payer: Self-pay | Admitting: Internal Medicine

## 2018-03-08 ENCOUNTER — Other Ambulatory Visit: Payer: Self-pay

## 2018-03-08 MED ORDER — GABAPENTIN 100 MG PO CAPS: ORAL_CAPSULE | ORAL | 1 refills | Status: DC

## 2018-03-08 NOTE — Telephone Encounter (Signed)
Patient is aware. New rx sent in

## 2018-03-08 NOTE — Telephone Encounter (Signed)
She can increase to 3 (100mg ) capsules before bed.  Follow and keep [place] posted on how doing.  Will need new rx called in with new directions.

## 2018-03-08 NOTE — Telephone Encounter (Signed)
Patient stated that some days are worse than others but most of the time she notices having more problems in the evenings and night time until she goes to bed. Every now and then she will have more of a problem in the morning after she walks. Patient is taking 100 mg 2 tablets QHS.

## 2018-03-08 NOTE — Telephone Encounter (Signed)
Please call pt and let her know that I reviewed her my chart message.  She is now taking 100mg  gabapentin (two tablets before bed).  Please clarify with her when she is noticing more of the burning.  For example, is the night time dose helping at night and she is having more problems during the day.  Can adjust the dosing more.  Also, I need a copy of the nerve conduction study performed at [facility] (Neurology) - 03/05/18.  Can tell it was done in [facility], but cannot pull up results.  Thanks

## 2018-03-18 ENCOUNTER — Ambulatory Visit: Payer: PPO | Admitting: Internal Medicine

## 2018-03-22 ENCOUNTER — Encounter: Payer: Self-pay | Admitting: Internal Medicine

## 2018-03-22 DIAGNOSIS — Z8739 Personal history of other diseases of the musculoskeletal system and connective tissue: Secondary | ICD-10-CM

## 2018-03-22 DIAGNOSIS — G629 Polyneuropathy, unspecified: Secondary | ICD-10-CM

## 2018-03-25 NOTE — Telephone Encounter (Signed)
Order placed for neurology referral.   

## 2018-03-29 ENCOUNTER — Other Ambulatory Visit: Payer: Self-pay | Admitting: Internal Medicine

## 2018-04-02 ENCOUNTER — Encounter: Payer: Self-pay | Admitting: Internal Medicine

## 2018-04-02 ENCOUNTER — Other Ambulatory Visit: Payer: Self-pay

## 2018-04-02 ENCOUNTER — Other Ambulatory Visit: Payer: Self-pay | Admitting: Internal Medicine

## 2018-04-02 MED ORDER — GABAPENTIN 100 MG PO CAPS: ORAL_CAPSULE | ORAL | 1 refills | Status: DC

## 2018-04-09 ENCOUNTER — Other Ambulatory Visit (INDEPENDENT_AMBULATORY_CARE_PROVIDER_SITE_OTHER): Payer: PPO

## 2018-04-09 DIAGNOSIS — E7800 Pure hypercholesterolemia, unspecified: Secondary | ICD-10-CM | POA: Diagnosis not present

## 2018-04-09 DIAGNOSIS — I10 Essential (primary) hypertension: Secondary | ICD-10-CM | POA: Diagnosis not present

## 2018-04-09 LAB — HEPATIC FUNCTION PANEL
ALT: 17 U/L (ref 0–35)
AST: 16 U/L (ref 0–37)
Albumin: 4.2 g/dL (ref 3.5–5.2)
Alkaline Phosphatase: 35 U/L — ABNORMAL LOW (ref 39–117)
BILIRUBIN TOTAL: 0.6 mg/dL (ref 0.2–1.2)
Bilirubin, Direct: 0.1 mg/dL (ref 0.0–0.3)
TOTAL PROTEIN: 6.5 g/dL (ref 6.0–8.3)

## 2018-04-09 LAB — LIPID PANEL
CHOLESTEROL: 106 mg/dL (ref 0–200)
HDL: 35.2 mg/dL — ABNORMAL LOW (ref >39.00)
LDL CALC: 53 mg/dL (ref 0–99)
NonHDL: 71.06
TRIGLYCERIDES: 88 mg/dL (ref 0.0–149.0)
Total CHOL/HDL Ratio: 3
VLDL: 17.6 mg/dL (ref 0.0–40.0)

## 2018-04-09 LAB — BASIC METABOLIC PANEL
BUN: 17 mg/dL (ref 6–23)
CHLORIDE: 105 meq/L (ref 96–112)
CO2: 31 mEq/L (ref 19–32)
Calcium: 10.3 mg/dL (ref 8.4–10.5)
Creatinine, Ser: 0.87 mg/dL (ref 0.40–1.20)
GFR: 69.05 mL/min (ref >60.00)
Glucose, Bld: 91 mg/dL (ref 70–99)
POTASSIUM: 4.3 meq/L (ref 3.5–5.1)
Sodium: 141 mEq/L (ref 135–145)

## 2018-04-10 ENCOUNTER — Encounter: Payer: Self-pay | Admitting: Internal Medicine

## 2018-04-11 ENCOUNTER — Ambulatory Visit (INDEPENDENT_AMBULATORY_CARE_PROVIDER_SITE_OTHER): Payer: PPO | Admitting: Internal Medicine

## 2018-04-11 ENCOUNTER — Encounter: Payer: Self-pay | Admitting: Internal Medicine

## 2018-04-11 VITALS — BP 148/78 | HR 63 | Temp 98.3°F | Resp 16 | Wt 174.4 lb

## 2018-04-11 DIAGNOSIS — I10 Essential (primary) hypertension: Secondary | ICD-10-CM | POA: Diagnosis not present

## 2018-04-11 DIAGNOSIS — D3502 Benign neoplasm of left adrenal gland: Secondary | ICD-10-CM

## 2018-04-11 DIAGNOSIS — G629 Polyneuropathy, unspecified: Secondary | ICD-10-CM

## 2018-04-11 DIAGNOSIS — E538 Deficiency of other specified B group vitamins: Secondary | ICD-10-CM | POA: Diagnosis not present

## 2018-04-11 DIAGNOSIS — I2510 Atherosclerotic heart disease of native coronary artery without angina pectoris: Secondary | ICD-10-CM

## 2018-04-11 DIAGNOSIS — K2100 Gastro-esophageal reflux disease with esophagitis, without bleeding: Secondary | ICD-10-CM

## 2018-04-11 DIAGNOSIS — Z1231 Encounter for screening mammogram for malignant neoplasm of breast: Secondary | ICD-10-CM | POA: Diagnosis not present

## 2018-04-11 DIAGNOSIS — E7800 Pure hypercholesterolemia, unspecified: Secondary | ICD-10-CM | POA: Diagnosis not present

## 2018-04-11 DIAGNOSIS — Z1239 Encounter for other screening for malignant neoplasm of breast: Secondary | ICD-10-CM

## 2018-04-11 MED ORDER — CYANOCOBALAMIN 1000 MCG/ML IJ SOLN
1000.0000 ug | Freq: Once | INTRAMUSCULAR | Status: AC
  Administered 2018-04-11: 1000 ug via INTRAMUSCULAR

## 2018-04-11 MED ORDER — GABAPENTIN 100 MG PO CAPS: ORAL_CAPSULE | ORAL | 1 refills | Status: DC

## 2018-04-11 NOTE — Progress Notes (Signed)
[name], female   DOB: 15-Oct-1950, 67 y.o.   MRN: [id number]   Subjective:    [name], female    DOB: 07/22/1951, 67 y.o.   MRN: [id number]  HPI  Patient here for a scheduled follow up.  She reports continued problems with burning and discomfort in her feet.  On gabapentin.  Thought may have helped some.  Still with discomfort.  Using wraps and icy hot.  Concerned regarding persistent increased discomfort.  Bothers her during the day as well.  Had NCS - moderate sensory polyneuropathy.  Receiving B12 injections.  States otherwise she is doing well.  No chest pain.  Breathing stable.  No acid reflux.  No abdominal pain.  Bowels moving.  Discussed w/up for previous adrenal lesion.  Had CT and ultrasound.  Saw urology - left adrenal myeolipoma.     Past Medical History:  Diagnosis Date  . Acid reflux   . Coronary artery disease   . 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 Heart murmur   . History of chicken pox   . Hyperlipidemia   . Hypertension   . Insomnia    Past Surgical History:  Procedure Laterality Date  . APPENDECTOMY    . BREAST EXCISIONAL BIOPSY Right    negative over 5 years ago- neg  . BREAST SURGERY     Biopsy  . COLONOSCOPY WITH PROPOFOL N/A 05/16/2016   Procedure: COLONOSCOPY WITH PROPOFOL;  Surgeon: [name], MD;  Location: [facility]COPY;  Service: Endoscopy;  Laterality: N/A;  . CORONARY ARTERY BYPASS GRAFT  05/01/2014   3 vessel, [facility]  . ESOPHAGOGASTRODUODENOSCOPY (EGD) WITH PROPOFOL N/A 02/23/2017   Procedure: ESOPHAGOGASTRODUODENOSCOPY (EGD) WITH PROPOFOL;  Surgeon: [name], MD;  Location: [place];  Service: Endoscopy;  Laterality: N/A;  . HYSTEROSCOPY W/D&C    . lipoma removed     removed from forehead  . triple bypass     Family History  Problem Relation Age of Onset  . Breast cancer Unknown        maternal great aunt  . Heart disease Unknown        multiple family members    . Heart disease Mother   . Heart disease Father   . Breast cancer Sister 8  . Colon cancer Neg Hx   . Diabetes Neg Hx   . Ovarian cancer Neg Hx    Social History   Socioeconomic History  . Marital status: Married    Spouse name: Not on file  . Number of children: Not on file  . Years of education: Not on file  . Highest education level: Not on file  Occupational History  . Not on file  Social Needs  . Financial resource strain: Not on file  . Food insecurity:    Worry: Not on file    Inability: Not on file  . Transportation needs:    Medical: Not on file    Non-medical: Not on file  Tobacco Use  . Smoking status: Never Smoker  . Smokeless tobacco: Never Used  Substance and Sexual Activity  . Alcohol use: Yes    Alcohol/week: 0.0 oz    Comment: socially - 1x/3 wks  . Drug use: No  . Sexual activity: Yes    Birth control/protection: Post-menopausal  Lifestyle  . Physical activity:    Days per week: Not on file    Minutes per session: Not on file  .  Stress: Not on file  Relationships  . Social connections:    Talks on phone: Not on file    Gets together: Not on file    Attends religious service: Not on file    Active member of club or organization: Not on file    Attends meetings of clubs or organizations: Not on file    Relationship status: Not on file  Other Topics Concern  . Not on file  Social History Narrative  . Not on file    Outpatient Encounter Medications as of 04/11/2018  Medication Sig  . amLODipine (NORVASC) 10 MG tablet Take 10 mg by mouth daily.  Marland Kitchen aspirin EC 81 MG tablet Take 81 mg by mouth daily.  Marland Kitchen atorvastatin (LIPITOR) 20 MG tablet TAKE 1 TABLET (20 MG TOTAL) BY MOUTH ONCE DAILY.  Marland Kitchen Docusate Calcium (STOOL SOFTENER PO) Take by mouth.  . gabapentin (NEURONTIN) 100 MG capsule Take 1 capsule in the am, one capsule midday and 3 capsules in om.  . hydrochlorothiazide (HYDRODIURIL) 25 MG tablet TAKE 1 TABLET (25 MG TOTAL) BY MOUTH ONCE DAILY.   . metoprolol tartrate (LOPRESSOR) 25 MG tablet Take 25 mg by mouth 2 (two) times daily.  . Omega-3 Fatty Acids (FISH OIL) 1000 MG CAPS Take 1,200 mg by mouth daily.  . pantoprazole (PROTONIX) 40 MG tablet TAKE 1 TABLET (40 MG TOTAL) BY MOUTH DAILY. TAKE 30 MINUTES BEFORE BREAKFAST  . telmisartan (MICARDIS) 80 MG tablet TAKE 1 TABLET (80 MG TOTAL) BY MOUTH ONCE DAILY.  . traZODone (DESYREL) 50 MG tablet TAKE 1-2 TABLETS (50-100 MG TOTAL) BY MOUTH AT BEDTIME AS NEEDED.  Marland Kitchen VITAMIN D, ERGOCALCIFEROL, PO Take by mouth.  . [DISCONTINUED] gabapentin (NEURONTIN) 100 MG capsule Take 3 capsules daily in the evening  . [EXPIRED] cyanocobalamin ((VITAMIN B-12)) injection 1,000 mcg    No facility-administered encounter medications on file as of 04/11/2018.     Review of Systems  Constitutional: Negative for appetite change and unexpected weight change.  HENT: Negative for congestion and sinus pressure.   Respiratory: Negative for cough, chest tightness and shortness of breath.   Cardiovascular: Negative for chest pain, palpitations and leg swelling.  Gastrointestinal: Negative for abdominal pain, diarrhea, nausea and vomiting.  Genitourinary: Negative for difficulty urinating and dysuria.  Musculoskeletal: Negative for joint swelling and myalgias.  Skin: Negative for color change and rash.  Neurological: Negative for dizziness, light-headedness and headaches.       Numbness in feet.    Psychiatric/Behavioral: Negative for agitation and dysphoric mood.       Objective:    Physical Exam  Constitutional: She appears well-developed and well-nourished. No distress.  HENT:  Nose: Nose normal.  Mouth/Throat: Oropharynx is clear and moist.  Neck: Neck supple. No thyromegaly present.  Cardiovascular: Normal rate and regular rhythm.  Pulmonary/Chest: Breath sounds normal. No respiratory distress. She has no wheezes.  Abdominal: Soft. Bowel sounds are normal. There is no tenderness.  Musculoskeletal:  She exhibits no edema or tenderness.  Lymphadenopathy:    She has no cervical adenopathy.  Skin: No rash noted. No erythema.  Psychiatric: She has a normal mood and affect. Her behavior is normal.    BP (!) 148/78 (BP Location: Left Arm, Patient Position: Sitting, Cuff Size: Normal)   Pulse 63   Temp 98.3 F (36.8 C) (Oral)   Resp 16   Wt 174 lb 6.4 oz (79.1 kg)   SpO2 98%   BMI 24.42 kg/m  Wt Readings from Last 3  Encounters:  04/11/18 174 lb 6.4 oz (79.1 kg)  02/15/18 172 lb 6.4 oz (78.2 kg)  11/28/17 172 lb 3.2 oz (78.1 kg)     Lab Results  Component Value Date   WBC 5.9 02/15/2018   HGB 13.4 02/15/2018   HCT 39.4 02/15/2018   PLT 176.0 02/15/2018   GLUCOSE 91 04/09/2018   CHOL 106 04/09/2018   TRIG 88.0 04/09/2018   HDL 35.20 (L) 04/09/2018   LDLCALC 53 04/09/2018   ALT 17 04/09/2018   AST 16 04/09/2018   NA 141 04/09/2018   K 4.3 04/09/2018   CL 105 04/09/2018   CREATININE 0.87 04/09/2018   BUN 17 04/09/2018   CO2 31 04/09/2018   TSH 3.45 02/15/2018    Mm Digital Diagnostic Unilat R  Result Date: 05/24/2017 CLINICAL DATA:  Patient returns today to evaluate right breast calcifications identified on recent screening mammogram. History of benign right breast excisional biopsy. EXAM: DIGITAL DIAGNOSTIC RIGHT MAMMOGRAM WITH CAD COMPARISON:  Previous exams including recent screening mammogram dated 05/16/2017. ACR Breast Density Category b: There are scattered areas of fibroglandular density. FINDINGS: On today's additional views, including magnification views, loosely grouped punctate and coarse calcifications are confirmed within the upper-outer quadrant of the right breast, at the site of patient's previous excisional biopsy. On today's study, the calcifications do not appear significantly changed compared to older studies dating back to 2007 indicating benignity. No suspicious pleomorphic or linear branching calcifications identified. Mammographic images were  processed with CAD. IMPRESSION: No evidence of malignancy. Benign calcifications within the right breast. Patient may return to routine annual bilateral screening mammogram schedule. RECOMMENDATION: Screening mammogram in one year.(Code:SM-B-01Y) I have discussed the findings and recommendations with the patient. Results were also provided in writing at the conclusion of the visit. If applicable, a reminder letter will be sent to the patient regarding the next appointment. BI-RADS CATEGORY  2: Benign. Electronically Signed   By: Franki Cabot M.D.   On: 05/24/2017 10:53       Assessment & Plan:   Problem List Items Addressed This Visit    Adrenal benign neoplasm    Has been evaluated by urology previously.  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  No further w/up warranted.   Had ultrasound.        CAD (coronary artery disease)    Doing well.  Continue risk factor modification.  Followed by cardiology.        Esophagitis, reflux    Controlled on protonix.        Essential hypertension    Blood pressure on recheck improved.        Hypercholesterolemia    On lipitor.  Low cholesterol diet and exercise.  Follow lipid panel and liver function tests.        Neuropathy    Had NCS - polyneuropathy.  On gabapentin.  Adjust dose as instructed.  Refer to neurology for further evaluation.         Other Visit Diagnoses    Breast cancer screening    -  Primary   Relevant Orders   MM 3D SCREEN BREAST BILATERAL   B12 deficiency       Relevant Medications   cyanocobalamin ((VITAMIN B-12)) injection 1,000 mcg (Completed)       Einar Pheasant, MD

## 2018-04-14 ENCOUNTER — Encounter: Payer: Self-pay | Admitting: Internal Medicine

## 2018-04-14 NOTE — Assessment & Plan Note (Signed)
On lipitor.  Low cholesterol diet and exercise.  Follow lipid panel and liver function tests.   

## 2018-04-14 NOTE — Assessment & Plan Note (Signed)
Has been evaluated by urology previously.  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  No further w/up warranted.   Had ultrasound.

## 2018-04-14 NOTE — Assessment & Plan Note (Signed)
Doing well.  Continue risk factor modification.  Followed by cardiology.

## 2018-04-14 NOTE — Assessment & Plan Note (Signed)
Controlled on protonix.   

## 2018-04-14 NOTE — Assessment & Plan Note (Signed)
Blood pressure on recheck improved.   

## 2018-04-14 NOTE — Assessment & Plan Note (Signed)
Had NCS - polyneuropathy.  On gabapentin.  Adjust dose as instructed.  Refer to neurology for further evaluation.

## 2018-04-17 ENCOUNTER — Encounter: Payer: Self-pay | Admitting: Internal Medicine

## 2018-05-14 ENCOUNTER — Ambulatory Visit (INDEPENDENT_AMBULATORY_CARE_PROVIDER_SITE_OTHER): Payer: PPO

## 2018-05-14 ENCOUNTER — Ambulatory Visit: Payer: PPO

## 2018-05-14 VITALS — BP 136/78 | HR 52 | Temp 98.0°F | Resp 15 | Ht 69.5 in | Wt 173.1 lb

## 2018-05-14 DIAGNOSIS — Z0000 Encounter for general adult medical examination without abnormal findings: Secondary | ICD-10-CM

## 2018-05-14 DIAGNOSIS — E538 Deficiency of other specified B group vitamins: Secondary | ICD-10-CM

## 2018-05-14 MED ORDER — CYANOCOBALAMIN 1000 MCG/ML IJ SOLN
1000.0000 ug | Freq: Once | INTRAMUSCULAR | Status: AC
  Administered 2018-05-14: 1000 ug via INTRAMUSCULAR

## 2018-05-14 NOTE — Patient Instructions (Addendum)
  Ms. Gentry , Thank you for taking time to come for your Medicare Wellness Visit. I appreciate your ongoing commitment to your health goals. Please review the following plan we discussed and let me know if I can assist you in the future.   Follow up as needed.    Bring a copy of your [place] and/or Living Will to be scanned into chart.  Have a great day! These are the goals we discussed: Goals    . Increase physical activity     Weight bearing exercises       This is a list of the screening recommended for you and due dates:  Health Maintenance  Topic Date Due  . Flu Shot  04/11/2018  . Mammogram  05/24/2018  . Pneumonia vaccines (2 of 2 - PPSV23) 07/03/2019  . Tetanus Vaccine  08/15/2025  . Colon Cancer Screening  05/16/2026  . DEXA scan (bone density measurement)  Completed  .  Hepatitis C: One time screening is recommended by Center for Disease Control  (CDC) for  adults born from [birth year] through 1965.   Completed

## 2018-05-14 NOTE — Progress Notes (Signed)
Subjective:   Alexandra Mendoza is a 67 y.o. female who presents for an Initial Medicare Annual Wellness Visit.  Review of Systems    No ROS.  Medicare Wellness Visit. Additional risk factors are reflected in the social history.  Cardiac Risk Factors include: advanced age (>24men, >54 women);hypertension     Objective:    Today's Vitals   05/14/18 0838  BP: 136/78  Pulse: (!) 52  Resp: 15  Temp: 98 F (36.7 C)  TempSrc: Oral  SpO2: 98%  Weight: 173 lb 1.9 oz (78.5 kg)  Height: 5' 9.5" (1.765 m)   Body mass index is 25.2 kg/m.  Advanced Directives 05/14/2018 02/23/2017 02/06/2016  Does Patient Have a Medical Advance Directive? Yes No No  Type of Paramedic of [place];Living will - -  Does patient want to make changes to medical advance directive? No - Patient declined - -  Copy of [place] in Chart? No - copy requested - -  Would patient like information on creating a medical advance directive? - Yes (MAU/Ambulatory/Procedural Areas - Information given) No - patient declined information    Current Medications (verified) Outpatient Encounter Medications as of 05/14/2018  Medication Sig  . amLODipine (NORVASC) 10 MG tablet Take 10 mg by mouth daily.  Marland Kitchen aspirin EC 81 MG tablet Take 81 mg by mouth daily.  Marland Kitchen atorvastatin (LIPITOR) 20 MG tablet TAKE 1 TABLET (20 MG TOTAL) BY MOUTH ONCE DAILY.  Marland Kitchen Docusate Calcium (STOOL SOFTENER PO) Take by mouth.  . gabapentin (NEURONTIN) 100 MG capsule Take 1 capsule in the am, one capsule midday and 3 capsules in om.  . hydrochlorothiazide (HYDRODIURIL) 25 MG tablet TAKE 1 TABLET (25 MG TOTAL) BY MOUTH ONCE DAILY.  . metoprolol tartrate (LOPRESSOR) 25 MG tablet Take 25 mg by mouth 2 (two) times daily.  . Omega-3 Fatty Acids (FISH OIL) 1000 MG CAPS Take 1,200 mg by mouth daily.  . pantoprazole (PROTONIX) 40 MG tablet TAKE 1 TABLET (40 MG TOTAL) BY MOUTH DAILY. TAKE 30 MINUTES BEFORE BREAKFAST  .  telmisartan (MICARDIS) 80 MG tablet TAKE 1 TABLET (80 MG TOTAL) BY MOUTH ONCE DAILY.  . traZODone (DESYREL) 50 MG tablet TAKE 1-2 TABLETS (50-100 MG TOTAL) BY MOUTH AT BEDTIME AS NEEDED.  Marland Kitchen VITAMIN D, ERGOCALCIFEROL, PO Take by mouth.   No facility-administered encounter medications on file as of 05/14/2018.     Allergies (verified) Patient has no known allergies.   History: Past Medical History:  Diagnosis Date  . Acid reflux   . Coronary artery disease   . 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 Heart murmur   . History of chicken pox   . Hyperlipidemia   . Hypertension   . Insomnia    Past Surgical History:  Procedure Laterality Date  . APPENDECTOMY    . BREAST EXCISIONAL BIOPSY Right    negative over 5 years ago- neg  . BREAST SURGERY     Biopsy  . COLONOSCOPY WITH PROPOFOL N/A 05/16/2016   Procedure: COLONOSCOPY WITH PROPOFOL;  Surgeon: [name], MD;  Location: [facility] ENDOSCOPY;  Service: Endoscopy;  Laterality: N/A;  . CORONARY ARTERY BYPASS GRAFT  05/01/2014   3 vessel, [facility]  . ESOPHAGOGASTRODUODENOSCOPY (EGD) WITH PROPOFOL N/A 02/23/2017   Procedure: ESOPHAGOGASTRODUODENOSCOPY (EGD) WITH PROPOFOL;  Surgeon: [name], MD;  Location: [place];  Service: Endoscopy;  Laterality: N/A;  . HYSTEROSCOPY W/D&C    . lipoma  removed     removed from forehead  . triple bypass     Family History  Problem Relation Age of Onset  . Breast cancer Unknown        maternal great aunt  . Heart disease Unknown        multiple family members  . Heart disease Mother   . Stroke Mother   . Alzheimer's disease Mother   . Heart disease Father   . Stroke Father   . Breast cancer Sister 66  . Alzheimer's disease Maternal Aunt   . Alzheimer's disease Maternal Uncle   . Alzheimer's disease Maternal Grandmother   . Colon cancer Neg Hx   . Diabetes Neg Hx   . Ovarian cancer Neg Hx    Social History   Socioeconomic History  . Marital status:  Married    Spouse name: Not on file  . Number of children: Not on file  . Years of education: Not on file  . Highest education level: Not on file  Occupational History  . Not on file  Social Needs  . Financial resource strain: Not hard at all  . Food insecurity:    Worry: Never true    Inability: Never true  . Transportation needs:    Medical: No    Non-medical: No  Tobacco Use  . Smoking status: Never Smoker  . Smokeless tobacco: Never Used  Substance and Sexual Activity  . Alcohol use: Yes    Alcohol/week: 0.0 standard drinks    Comment: socially - 1x/3 wks  . Drug use: No  . Sexual activity: Yes    Birth control/protection: Post-menopausal  Lifestyle  . Physical activity:    Days per week: 3 days    Minutes per session: 50 min  . Stress: Not at all  Relationships  . Social connections:    Talks on phone: Not on file    Gets together: Not on file    Attends religious service: Not on file    Active member of club or organization: Not on file    Attends meetings of clubs or organizations: Not on file    Relationship status: Not on file  Other Topics Concern  . Not on file  Social History Narrative  . Not on file    Tobacco Counseling Counseling given: Not Answered   Clinical Intake:  Pre-visit preparation completed: Yes  Pain : No/denies pain     Nutritional Status: BMI 25 -29 Overweight Diabetes: No  How often do you need to have someone help you when you read instructions, pamphlets, or other written materials from your doctor or pharmacy?: 1 - Never  Interpreter Needed?: No      Activities of Daily Living In your present state of health, do you have any difficulty performing the following activities: 05/14/2018  Hearing? N  Vision? N  Difficulty concentrating or making decisions? N  Walking or climbing stairs? N  Dressing or bathing? N  Doing errands, shopping? N  Preparing Food and eating ? N  Using the Toilet? N  In the past six months,  have you accidently leaked urine? Y  Comment Managed with liner as needed  Do you have problems with loss of bowel control? N  Managing your Medications? N  Managing your Finances? N  Housekeeping or managing your Housekeeping? N  Some recent data might be hidden     Immunizations and Health Maintenance Immunization History  Administered Date(s) Administered  . Influenza, High Dose Seasonal PF  06/22/2016, 06/25/2017  . Influenza,inj,Quad PF,6+ Mos 07/02/2014, 06/22/2015  . Influenza-Unspecified 07/02/2014  . Pneumococcal Conjugate-13 08/29/2016  . Pneumococcal Polysaccharide-23 07/02/2014  . Tdap 08/16/2015  . Zoster 04/06/2012, 09/11/2013  . Zoster Recombinat (Shingrix) 09/24/2017, 01/01/2018   Health Maintenance Due  Topic Date Due  . INFLUENZA VACCINE  04/11/2018    Patient Care Team: Einar Pheasant, MD as PCP - General (Internal Medicine)  Indicate any recent Medical Services you may have received from other than [facility] providers in the past year (date may be approximate).     Assessment:   This is a routine wellness examination for Alexandra Mendoza. The goal of the wellness visit is to assist the patient how to close the gaps in care and create a preventative care plan for the patient.   The roster of all physicians providing medical care to patient is listed in the Snapshot section of the chart.  Osteopenia. Taking calcium VIT D as appropriate/Osteoporosis risk reviewed.    Safety issues reviewed; Smoke and carbon monoxide detectors in the home. No firearms in the home. Wears seatbelts when driving or riding with others. No violence in the home.  They do not have excessive sun exposure.  Discussed the need for sun protection: hats, long sleeves and the use of sunscreen if there is significant sun exposure.  Patient is alert, normal appearance, oriented to person/place/and time. Correctly identified the president of the [place] and recalls of 3/3 words. Performs simple  calculations and can read correct time from watch face.  Displays appropriate judgement.  No new identified risk were noted.  No failures at ADL's or IADL's.    BMI- discussed the importance of a healthy diet, water intake and the benefits of aerobic exercise. Educational material provided.   24 hour diet recall: Moderate diet  Dental- UTD.  Eye- Visual acuity not assessed per patient preference since they have regular follow up with the ophthalmologist.  Wears corrective lenses.  Sleep patterns- Sleeps 6-8 hours at night.  Wakes feeling rested. Taking medication as directed.   B12 administered L deltoid, tolerated well. Educational material provided.  Influenza vaccine discussed.  She plans to receive later in the season.   Hearing/Vision screen Hearing Screening Comments: Patient is able to hear conversational tones without difficulty.  No issues reported.   Vision Screening Comments: Followed by [facility]  Wears reading glasses Annual exam Visual acuity not assessed per patient preference since they have regular follow up with the ophthalmologist  Dietary issues and exercise activities discussed: Current Exercise Habits: Home exercise routine, Type of exercise: walking, Time (Minutes): 45, Frequency (Times/Week): 3, Weekly Exercise (Minutes/Week): 135  Goals    . Increase physical activity     Weight bearing exercises      Depression Screen PHQ 2/9 Scores 05/14/2018 07/17/2017 03/12/2017 07/13/2016 03/11/2015  PHQ - 2 Score 0 0 0 0 0  PHQ- 9 Score - - 0 - -    Fall Risk Fall Risk  05/14/2018 07/17/2017 07/13/2016 03/11/2015  Falls in the past year? No No No No   Cognitive Function: MMSE - Mini Mental State Exam 05/14/2018  Orientation to time 5  Orientation to Place 5  Registration 3  Attention/ Calculation 5  Recall 3  Language- name 2 objects 2  Language- repeat 1  Language- follow 3 step command 3  Language- read & follow direction 1  Write a sentence 1    Copy design 1  Total score 30  Screening Tests Health Maintenance  Topic Date Due  . INFLUENZA VACCINE  04/11/2018  . MAMMOGRAM  05/24/2018  . PNA vac Low Risk Adult (2 of 2 - PPSV23) 07/03/2019  . TETANUS/TDAP  08/15/2025  . COLONOSCOPY  05/16/2026  . DEXA SCAN  Completed  . Hepatitis C Screening  Completed     Plan:   End of life planning; Advance aging; Advanced directives discussed. Copy of current HCPOA/Living Will requested.    I have personally reviewed and noted the following in the patient's chart:   . Medical and social history . Use of alcohol, tobacco or illicit drugs  . Current medications and supplements . Functional ability and status . Nutritional status . Physical activity . Advanced directives . List of other physicians . Hospitalizations, surgeries, and ER visits in previous 12 months . Vitals . Screenings to include cognitive, depression, and falls . Referrals and appointments  In addition, I have reviewed and discussed with patient certain preventive protocols, quality metrics, and best practice recommendations. A written personalized care plan for preventive services as well as general preventive health recommendations were provided to patient.     Varney Biles, LPN   05/17/453    Reviewed above information.  Agree with assessment and plan.    Dr Nicki Reaper

## 2018-05-16 ENCOUNTER — Other Ambulatory Visit: Payer: Self-pay | Admitting: Neurology

## 2018-05-16 DIAGNOSIS — M4807 Spinal stenosis, lumbosacral region: Secondary | ICD-10-CM

## 2018-05-16 DIAGNOSIS — R208 Other disturbances of skin sensation: Secondary | ICD-10-CM | POA: Diagnosis not present

## 2018-05-16 DIAGNOSIS — G44099 Other trigeminal autonomic cephalgias (TAC), not intractable: Secondary | ICD-10-CM | POA: Diagnosis not present

## 2018-05-16 DIAGNOSIS — G608 Other hereditary and idiopathic neuropathies: Secondary | ICD-10-CM | POA: Diagnosis not present

## 2018-05-16 DIAGNOSIS — Z79899 Other long term (current) drug therapy: Secondary | ICD-10-CM | POA: Diagnosis not present

## 2018-05-16 DIAGNOSIS — M79604 Pain in right leg: Secondary | ICD-10-CM | POA: Diagnosis not present

## 2018-05-16 DIAGNOSIS — M79605 Pain in left leg: Secondary | ICD-10-CM | POA: Diagnosis not present

## 2018-05-17 ENCOUNTER — Ambulatory Visit
Admission: RE | Admit: 2018-05-17 | Discharge: 2018-05-17 | Disposition: A | Discharge status: Home / Self Care | Payer: PPO | Source: Ambulatory Visit | Attending: Internal Medicine | Admitting: Internal Medicine

## 2018-05-17 ENCOUNTER — Other Ambulatory Visit: Payer: Self-pay | Admitting: Internal Medicine

## 2018-05-17 DIAGNOSIS — Z1239 Encounter for other screening for malignant neoplasm of breast: Secondary | ICD-10-CM

## 2018-05-17 DIAGNOSIS — Z1231 Encounter for screening mammogram for malignant neoplasm of breast: Secondary | ICD-10-CM | POA: Diagnosis not present

## 2018-05-29 DIAGNOSIS — Z08 Encounter for follow-up examination after completed treatment for malignant neoplasm: Secondary | ICD-10-CM | POA: Diagnosis not present

## 2018-05-29 DIAGNOSIS — Z85828 Personal history of other malignant neoplasm of skin: Secondary | ICD-10-CM | POA: Diagnosis not present

## 2018-05-29 DIAGNOSIS — L910 Hypertrophic scar: Secondary | ICD-10-CM | POA: Diagnosis not present

## 2018-05-29 DIAGNOSIS — L821 Other seborrheic keratosis: Secondary | ICD-10-CM | POA: Diagnosis not present

## 2018-05-29 DIAGNOSIS — D485 Neoplasm of uncertain behavior of skin: Secondary | ICD-10-CM | POA: Diagnosis not present

## 2018-05-29 DIAGNOSIS — L570 Actinic keratosis: Secondary | ICD-10-CM | POA: Diagnosis not present

## 2018-05-30 DIAGNOSIS — R011 Cardiac murmur, unspecified: Secondary | ICD-10-CM | POA: Diagnosis not present

## 2018-05-30 DIAGNOSIS — H93A9 Pulsatile tinnitus, unspecified ear: Secondary | ICD-10-CM | POA: Diagnosis not present

## 2018-05-30 DIAGNOSIS — H93A2 Pulsatile tinnitus, left ear: Secondary | ICD-10-CM | POA: Diagnosis not present

## 2018-06-03 ENCOUNTER — Other Ambulatory Visit (INDEPENDENT_AMBULATORY_CARE_PROVIDER_SITE_OTHER): Payer: PPO

## 2018-06-03 ENCOUNTER — Other Ambulatory Visit (INDEPENDENT_AMBULATORY_CARE_PROVIDER_SITE_OTHER): Payer: Self-pay | Admitting: Unknown Physician Specialty

## 2018-06-03 DIAGNOSIS — R0989 Other specified symptoms and signs involving the circulatory and respiratory systems: Secondary | ICD-10-CM | POA: Diagnosis not present

## 2018-06-04 ENCOUNTER — Ambulatory Visit
Admission: RE | Admit: 2018-06-04 | Discharge: 2018-06-04 | Disposition: A | Discharge status: Home / Self Care | Payer: PPO | Source: Ambulatory Visit | Attending: Neurology | Admitting: Neurology

## 2018-06-04 DIAGNOSIS — M5136 Other intervertebral disc degeneration, lumbar region: Secondary | ICD-10-CM | POA: Diagnosis not present

## 2018-06-04 DIAGNOSIS — M48061 Spinal stenosis, lumbar region without neurogenic claudication: Secondary | ICD-10-CM | POA: Insufficient documentation

## 2018-06-04 DIAGNOSIS — M4807 Spinal stenosis, lumbosacral region: Secondary | ICD-10-CM | POA: Diagnosis not present

## 2018-06-04 DIAGNOSIS — M5126 Other intervertebral disc displacement, lumbar region: Secondary | ICD-10-CM | POA: Diagnosis not present

## 2018-06-13 IMAGING — MG MM DIGITAL SCREENING BILAT W/ TOMO W/ CAD
9 of 15 series · 9 of 35 positions shown · non-contrast
Comparison: Previous exam(s).

CLINICAL DATA: Screening.

EXAM:
2D DIGITAL SCREENING BILATERAL MAMMOGRAM WITH CAD AND ADJUNCT TOMO

[L CC]
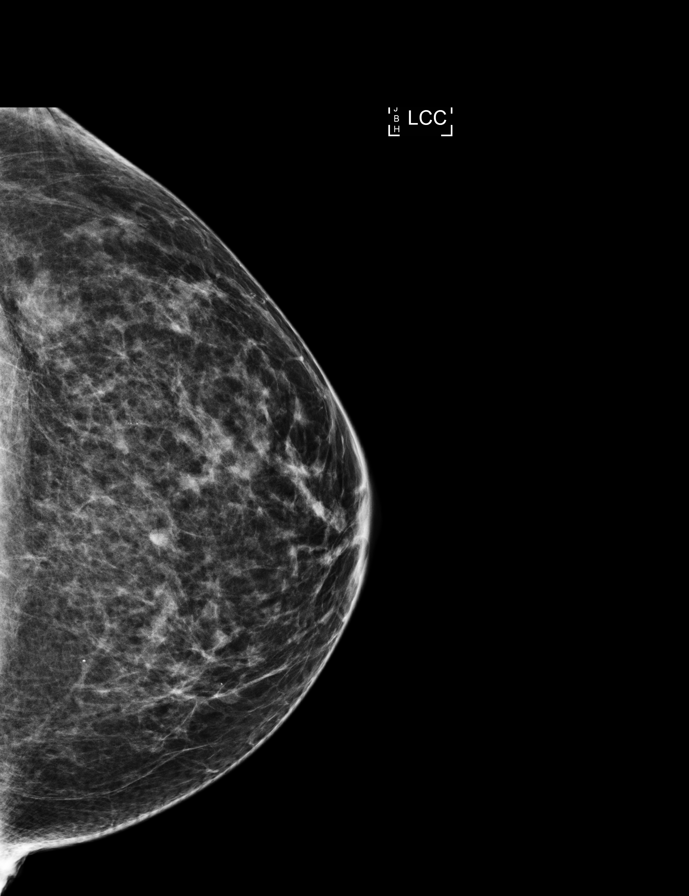

[L MLO]
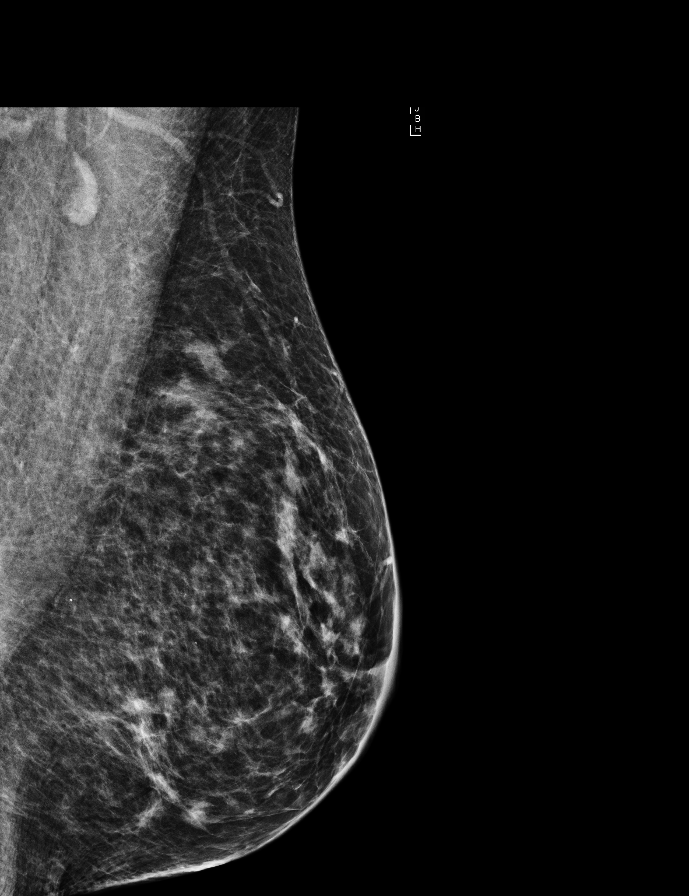

[L CC synth-2D]
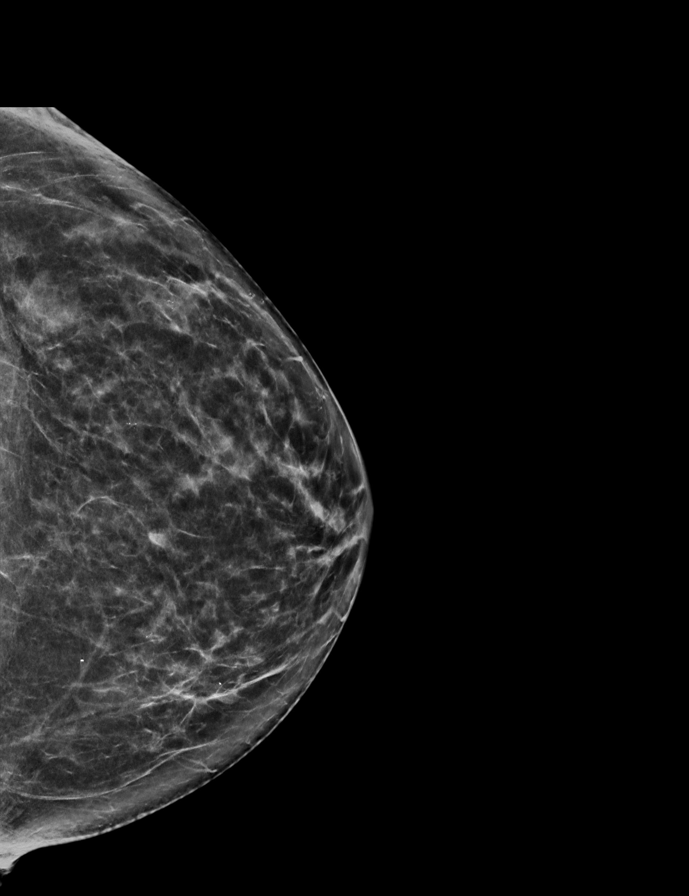

[R CC synth-2D]
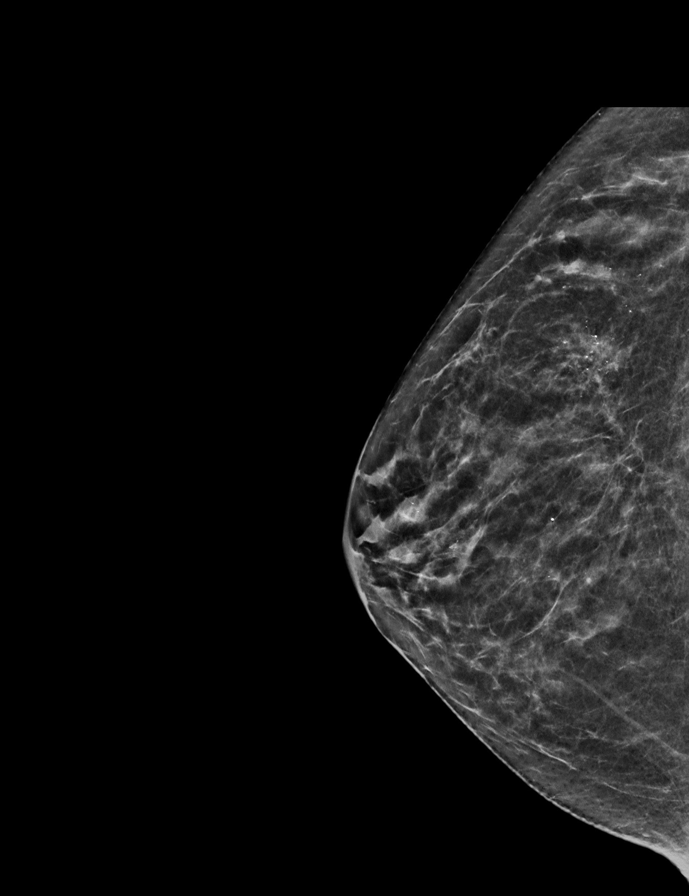

[L MLO synth-2D]
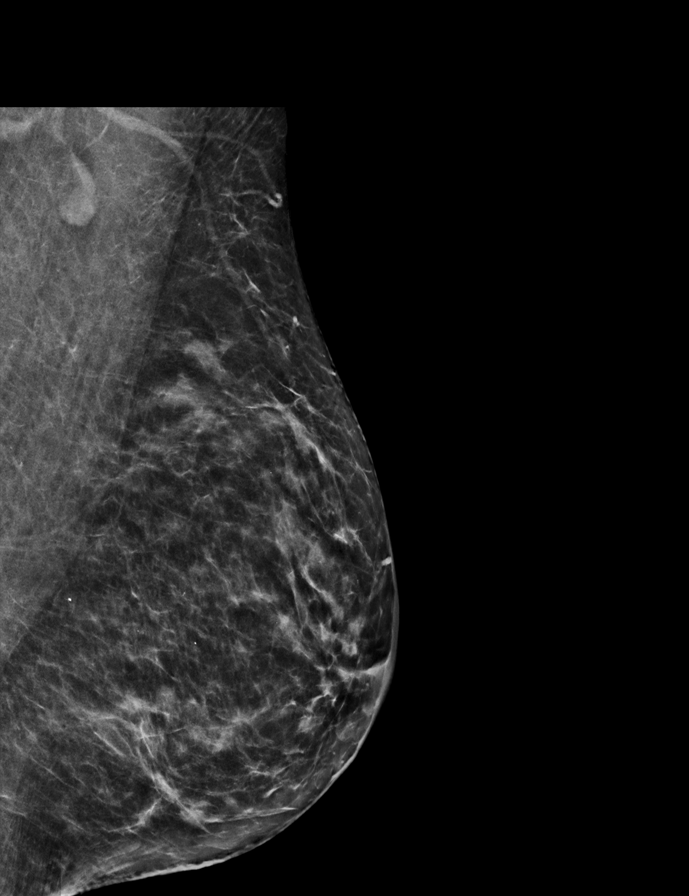

[R CC (1 of 2)]
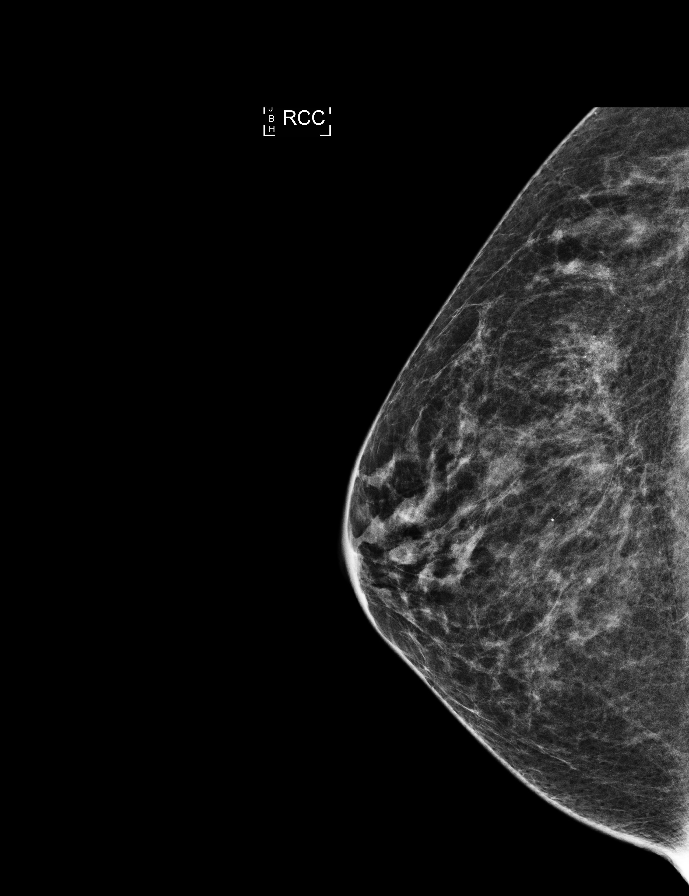

[R MLO]
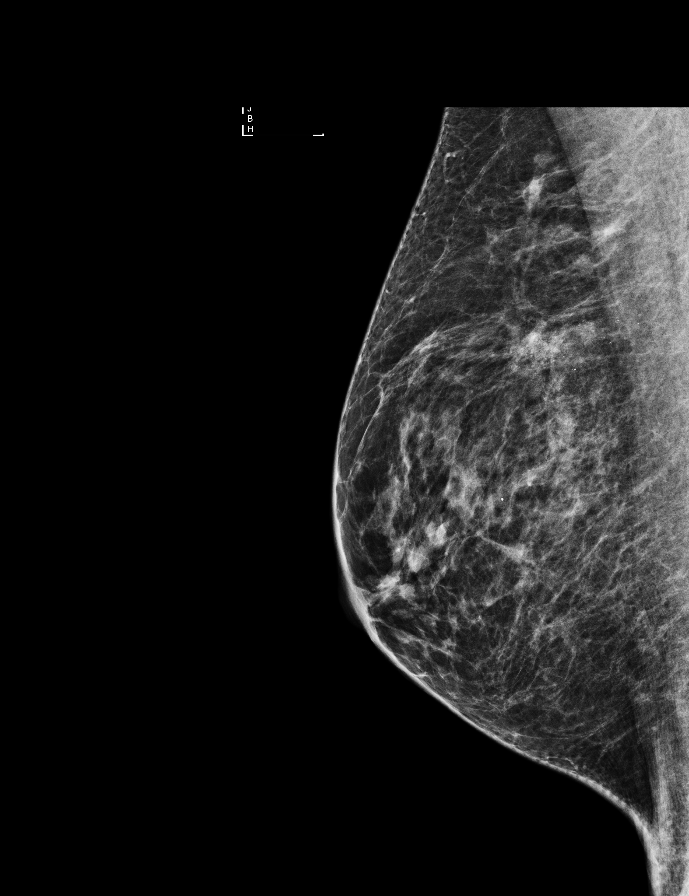

[R CC (2 of 2)]
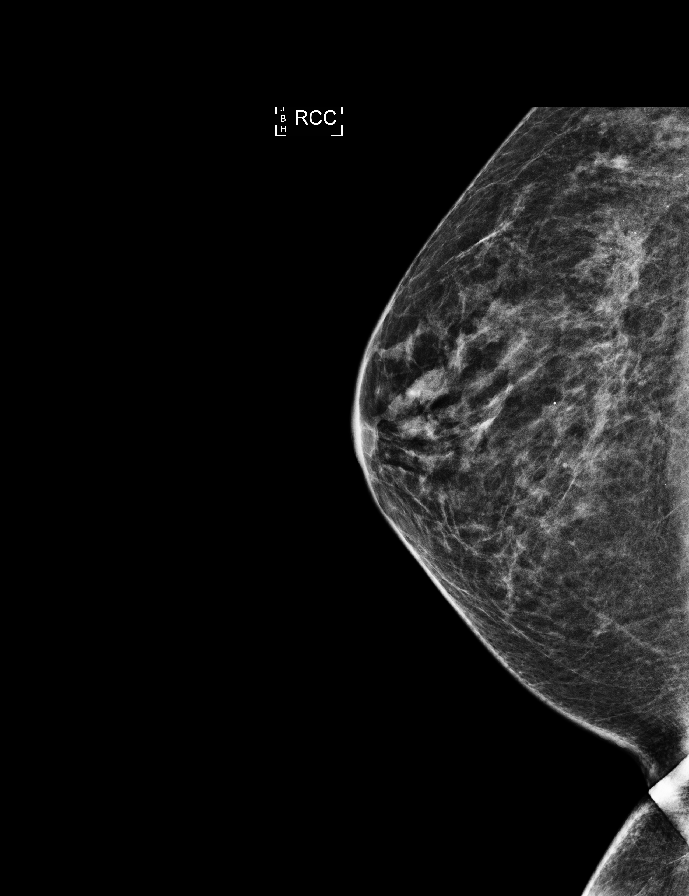

[R MLO synth-2D]
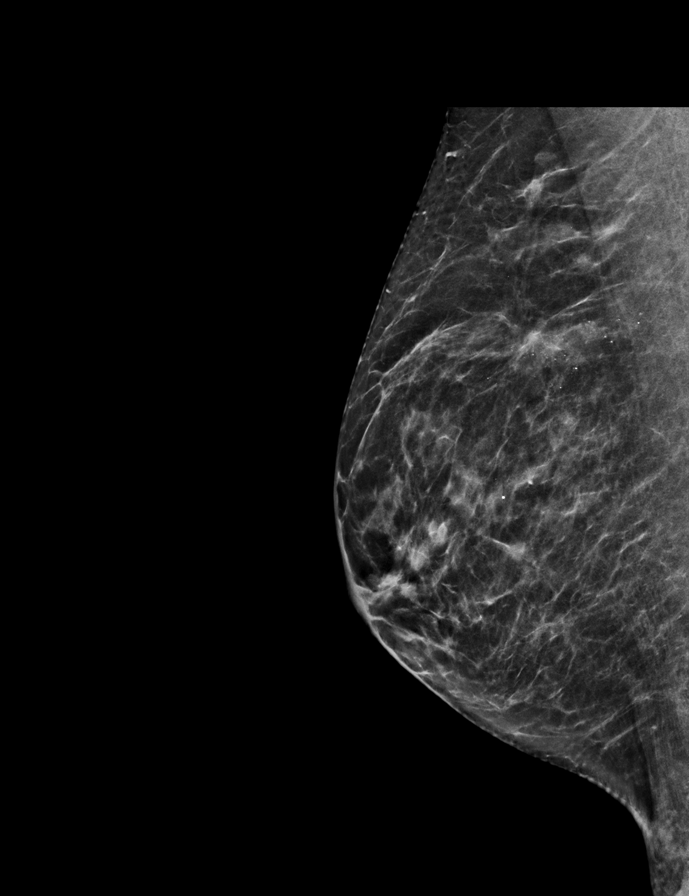

[9 of 35 positions shown; findings below may reference images not displayed]

ACR Breast Density Category c: The breast tissue is heterogeneously
dense, which may obscure small masses.
FINDINGS: There are no findings suspicious for malignancy. Images were
processed with CAD.
IMPRESSION: No mammographic evidence of malignancy. A result letter of this
screening mammogram will be mailed directly to the patient.

RECOMMENDATION:
Screening mammogram in one year. (Code:[id number])

BI-RADS CATEGORY  1: Negative.

## 2018-06-16 NOTE — Progress Notes (Signed)
ANNUAL PREVENTATIVE CARE GYN  ENCOUNTER NOTE  Subjective:       Alexandra Mendoza is a 67 y.o. G1P1001. female here for a routine annual gynecologic exam.  Current complaints: 1. Medicare breast and pelvic 2. Menopause asymptomatic 3. Feels anxiety x 2 months; patient reports feeling a "tightening up ball in the pit of her stomach" periodically, without true chest pain, shortness of breath, or dyspnea on exertion.  She is having multiple somatic complaints being addressed including peripheral neuropathy symptoms, ear symptoms/tinnitus, and the abdominal discomfort.  There are no obvious alleviating or exacerbating factors.  Ivin Booty does have insomnia which is managed with trazodone; Dr. Nicki Reaper also stated with it would help with some anxiety symptoms.  She does not recognize any significant psychosocial concerns at the present time.  Has no vasomotor symptoms of menopause. No urinary or bowel changes. No pain. Still sexually active. Denies dysuria, vaginal bleeding, itching or discharge. Not currently using estrogen cream. Reports recent DEXA scan 2018.  States results showed osteopenia, unsure of exact score. Not currently taking calcium supplement.   Gynecologic History No LMP recorded. Patient is postmenopausal. Contraception: post menopausal status Last Pap: 05/2016  neg. Last mammogram: 05/2018 birad 1 Results were: normal  Obstetric History OB History  Gravida Para Term Preterm AB Living  1 1 1     1   SAB TAB Ectopic Multiple Live Births          1    # Outcome Date GA Lbr Len/2nd Weight Sex Delivery Anes PTL Lv  1 Term 1975   8 lb 2.1 oz (3.688 kg) M Vag-Spont   LIV    Past Medical History:  Diagnosis Date  . Acid reflux   . Coronary artery disease   . 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 Heart murmur   . History of chicken pox   . Hyperlipidemia   . Hypertension   . Insomnia     Past Surgical History:  Procedure Laterality Date  . APPENDECTOMY    . BREAST  EXCISIONAL BIOPSY Right    negative over 5 years ago- neg  . BREAST SURGERY     Biopsy  . COLONOSCOPY WITH PROPOFOL N/A 05/16/2016   Procedure: COLONOSCOPY WITH PROPOFOL;  Surgeon: [name], MD;  Location: [facility] ENDOSCOPY;  Service: Endoscopy;  Laterality: N/A;  . CORONARY ARTERY BYPASS GRAFT  05/01/2014   3 vessel, [facility]  . ESOPHAGOGASTRODUODENOSCOPY (EGD) WITH PROPOFOL N/A 02/23/2017   Procedure: ESOPHAGOGASTRODUODENOSCOPY (EGD) WITH PROPOFOL;  Surgeon: [name], MD;  Location: [place];  Service: Endoscopy;  Laterality: N/A;  . HYSTEROSCOPY W/D&C    . lipoma removed     removed from forehead  . triple bypass      Current Outpatient Medications on File Prior to Visit  Medication Sig Dispense Refill  . amLODipine (NORVASC) 10 MG tablet Take 10 mg by mouth daily.  4  . aspirin EC 81 MG tablet Take 81 mg by mouth daily.    Marland Kitchen atorvastatin (LIPITOR) 20 MG tablet TAKE 1 TABLET (20 MG TOTAL) BY MOUTH ONCE DAILY.  5  . Docusate Calcium (STOOL SOFTENER PO) Take by mouth.    . gabapentin (NEURONTIN) 100 MG capsule TAKE ONE CAPSULE BY MOUTH IN THE MORNING, 1 CAPSULE MIDDAY AND 3 CAPSULES IN EVENING 450 capsule 1  . hydrochlorothiazide (HYDRODIURIL) 25 MG tablet TAKE 1 TABLET (25 MG TOTAL) BY MOUTH ONCE DAILY.  0  . metoprolol tartrate (  LOPRESSOR) 25 MG tablet Take 25 mg by mouth 2 (two) times daily.  5  . Omega-3 Fatty Acids (FISH OIL) 1000 MG CAPS Take 1,200 mg by mouth daily.    . pantoprazole (PROTONIX) 40 MG tablet TAKE 1 TABLET (40 MG TOTAL) BY MOUTH DAILY. TAKE 30 MINUTES BEFORE BREAKFAST 90 tablet 1  . telmisartan (MICARDIS) 80 MG tablet TAKE 1 TABLET (80 MG TOTAL) BY MOUTH ONCE DAILY.  0  . traZODone (DESYREL) 50 MG tablet TAKE 1-2 TABLETS (50-100 MG TOTAL) BY MOUTH AT BEDTIME AS NEEDED. 180 tablet 1  . VITAMIN D, ERGOCALCIFEROL, PO Take by mouth.     No current facility-administered medications on file prior to visit.     No Known Allergies  Social  History   Socioeconomic History  . Marital status: Married    Spouse name: Not on file  . Number of children: Not on file  . Years of education: Not on file  . Highest education level: Not on file  Occupational History  . Not on file  Social Needs  . Financial resource strain: Not hard at all  . Food insecurity:    Worry: Never true    Inability: Never true  . Transportation needs:    Medical: No    Non-medical: No  Tobacco Use  . Smoking status: Never Smoker  . Smokeless tobacco: Never Used  Substance and Sexual Activity  . Alcohol use: Yes    Alcohol/week: 0.0 standard drinks    Comment: socially - 1x/3 wks  . Drug use: No  . Sexual activity: Yes    Birth control/protection: Post-menopausal  Lifestyle  . Physical activity:    Days per week: 3 days    Minutes per session: 50 min  . Stress: Not at all  Relationships  . Social connections:    Talks on phone: Not on file    Gets together: Not on file    Attends religious service: Not on file    Active member of club or organization: Not on file    Attends meetings of clubs or organizations: Not on file    Relationship status: Not on file  . Intimate partner violence:    Fear of current or ex partner: No    Emotionally abused: No    Physically abused: No    Forced sexual activity: No  Other Topics Concern  . Not on file  Social History Narrative  . Not on file    Family History  Problem Relation Age of Onset  . Breast cancer Unknown        maternal great aunt  . Heart disease Unknown        multiple family members  . Heart disease Mother   . Stroke Mother   . Alzheimer's disease Mother   . Heart disease Father   . Stroke Father   . Breast cancer Sister 18  . Alzheimer's disease Maternal Aunt   . Alzheimer's disease Maternal Uncle   . Alzheimer's disease Maternal Grandmother   . Colon cancer Neg Hx   . Diabetes Neg Hx   . Ovarian cancer Neg Hx     The following portions of the patient's history were  reviewed and updated as appropriate: allergies, current medications, past family history, past medical history, past social history, past surgical history and problem list.  Review of Systems  Review of Systems  Constitutional: Negative.   HENT: Negative.   Eyes: Negative.   Respiratory: Negative.   Cardiovascular: Negative.  Gastrointestinal: Negative.   Genitourinary: Negative.   Musculoskeletal: Negative.   Skin: Negative.   Neurological: Negative.   Endo/Heme/Allergies: Negative.   Psychiatric/Behavioral: The patient is nervous/anxious and has insomnia.     Objective:   BP (!) 174/61   Pulse 73   Ht 5\' 9"  (1.753 m)   Wt 173 lb 14.4 oz (78.9 kg)   BMI 25.68 kg/m   CONSTITUTIONAL: Well-developed, well-nourished female in no acute distress.  Slightly anxious. PSYCHIATRIC: Normal mood and affect. Normal behavior. Normal judgment and thought content. [place]: Alert and oriented to person, place, and time. Normal muscle tone coordination. No cranial nerve deficit noted. HENT:  Normocephalic, atraumatic, External right and left ear normal.  EYES: Conjunctivae and EOM are normal No scleral icterus.  NECK: Normal range of motion, supple, no masses.  Normal thyroid.  SKIN: Skin is warm and dry. Not diaphoretic. No erythema. No pallor. CARDIOVASCULAR: Normal heart rate noted, regular rhythm, 2/6 systolic ejection murmur.  Sternotomy scar well-healed RESPIRATORY: Clear to auscultation bilaterally. Effort and breath sounds normal, no problems with respiration noted. BREASTS: Symmetric in size. No masses, skin changes, nipple drainage, or lymphadenopathy.  ABDOMEN: Soft, normal bowel sounds, no distention noted.  No tenderness, rebound or guarding.  Well-healed appendectomy Scar. Midline incision well-healed. BLADDER: Normal PELVIC:  External Genitalia: Normal  BUS: Normal  Vagina: Moderate atrophy, no CMT, mobile, soft.   Uterus: Normal shape, size, and consistency. Midplane and  mobile. No tenderness.  Adnexa: No masses or tenderness noted.  RV: External Exam NormaI, No Rectal Masses and Normal Sphincter tone  MUSCULOSKELETAL: Normal range of motion. No tenderness.  No cyanosis, clubbing, or edema.  2+ DP/PT pulses LYMPHATIC: No Axillary, Supraclavicular, cervical, or Inguinal Adenopathy.    Assessment:   Annual gynecologic examination 67 y.o. Contraception: post menopausal status Normal BMI Problem List Items Addressed This Visit    Family history of breast cancer in first degree relative   Fibrocystic breast changes of both breasts   Vaginal atrophy   Menopause   Osteopenia    Other Visit Diagnoses    Well woman exam with routine gynecological exam    -  Primary   Screening for colon cancer        Vaginal atrophy Menopause Anxiety Abdominal pain, nonacute, unclear etiology yes  Plan:  Pap: not needed Mammogram: utd Stool Guaiac Testing: ordered Labs: thru pcp Routine preventative health maintenance measures emphasized: Exercise/Diet/Weight control, Tobacco Warnings, Alcohol/Substance use risks, Stress Management, Peer Pressure Issues and Safe Sex  Begin calcium supplement Continue trazodone as prescribed by Dr. Nicki Reaper Anxiety disorder questionnaire is completed today (GAD 7 questionnaire score-5 (equals mild anxiety) [facility] therapy as recommended Return in mid December for follow-up  Joyice Faster, [place]  am acting as a Education administrator for [employer].  I have reviewed and updated and agree with data placed by Joyice Faster, CMA Brayton Mars, MD   Note: This dictation was prepared with Dragon dictation along with smaller phrase technology. Any transcriptional errors that result from this process are unintentional

## 2018-06-17 DIAGNOSIS — I25810 Atherosclerosis of coronary artery bypass graft(s) without angina pectoris: Secondary | ICD-10-CM | POA: Diagnosis not present

## 2018-06-17 DIAGNOSIS — I6523 Occlusion and stenosis of bilateral carotid arteries: Secondary | ICD-10-CM | POA: Diagnosis not present

## 2018-06-17 DIAGNOSIS — R072 Precordial pain: Secondary | ICD-10-CM | POA: Diagnosis not present

## 2018-06-17 DIAGNOSIS — E782 Mixed hyperlipidemia: Secondary | ICD-10-CM | POA: Diagnosis not present

## 2018-06-18 ENCOUNTER — Encounter: Payer: Self-pay | Admitting: Obstetrics and Gynecology

## 2018-06-18 ENCOUNTER — Ambulatory Visit (INDEPENDENT_AMBULATORY_CARE_PROVIDER_SITE_OTHER): Payer: PPO | Admitting: Obstetrics and Gynecology

## 2018-06-18 VITALS — BP 174/61 | HR 73 | Ht 69.0 in | Wt 173.9 lb

## 2018-06-18 DIAGNOSIS — Z01419 Encounter for gynecological examination (general) (routine) without abnormal findings: Secondary | ICD-10-CM

## 2018-06-18 DIAGNOSIS — Z780 Asymptomatic menopausal state: Secondary | ICD-10-CM | POA: Diagnosis not present

## 2018-06-18 DIAGNOSIS — M8580 Other specified disorders of bone density and structure, unspecified site: Secondary | ICD-10-CM

## 2018-06-18 DIAGNOSIS — N952 Postmenopausal atrophic vaginitis: Secondary | ICD-10-CM | POA: Diagnosis not present

## 2018-06-18 DIAGNOSIS — Z01411 Encounter for gynecological examination (general) (routine) with abnormal findings: Secondary | ICD-10-CM

## 2018-06-18 DIAGNOSIS — Z803 Family history of malignant neoplasm of breast: Secondary | ICD-10-CM | POA: Diagnosis not present

## 2018-06-18 DIAGNOSIS — Z1211 Encounter for screening for malignant neoplasm of colon: Secondary | ICD-10-CM

## 2018-06-18 DIAGNOSIS — G4709 Other insomnia: Secondary | ICD-10-CM | POA: Diagnosis not present

## 2018-06-18 DIAGNOSIS — R109 Unspecified abdominal pain: Secondary | ICD-10-CM

## 2018-06-18 DIAGNOSIS — N6011 Diffuse cystic mastopathy of right breast: Secondary | ICD-10-CM

## 2018-06-18 DIAGNOSIS — N6012 Diffuse cystic mastopathy of left breast: Secondary | ICD-10-CM | POA: Diagnosis not present

## 2018-06-18 NOTE — Patient Instructions (Addendum)
1.  No Pap smear is done.  No further Pap smears are needed. 2.  Mammogram is up-to-date. 3.  Stool guaiac card testing is obtained for colon cancer screening.  Screening labs are obtained through primary care. 5.  Continue with trazodone as prescribed by Dr. Nicki Reaper for insomnia 6.  Consider counseling through the [facility] counseling center regarding anxiety symptomatology 7.  Continue with calcium and vitamin D supplementation daily 8.  Return in mid December for follow-up on abdominal pain, anxiety, and insomnia   Health Maintenance for Postmenopausal Women Menopause is a normal process in which your reproductive ability comes to an end. This process happens gradually over a span of months to years, usually between the ages of 70 and 31. Menopause is complete when you have missed 12 consecutive menstrual periods. It is important to talk with your health care provider about some of the most common conditions that affect postmenopausal women, such as heart disease, cancer, and bone loss (osteoporosis). Adopting a healthy lifestyle and getting preventive care can help to promote your health and wellness. Those actions can also lower your chances of developing some of these common conditions. What should I know about menopause? During menopause, you may experience a number of symptoms, such as:  Moderate-to-severe hot flashes.  Night sweats.  Decrease in sex drive.  Mood swings.  Headaches.  Tiredness.  Irritability.  Memory problems.  Insomnia.  Choosing to treat or not to treat menopausal changes is an individual decision that you make with your health care provider. What should I know about hormone replacement therapy and supplements? Hormone therapy products are effective for treating symptoms that are associated with menopause, such as hot flashes and night sweats. Hormone replacement carries certain risks, especially as you become older. If you are thinking about using estrogen or  estrogen with progestin treatments, discuss the benefits and risks with your health care provider. What should I know about heart disease and stroke? Heart disease, heart attack, and stroke become more likely as you age. This may be due, in part, to the hormonal changes that your body experiences during menopause. These can affect how your body processes dietary fats, triglycerides, and cholesterol. Heart attack and stroke are both medical emergencies. There are many things that you can do to help prevent heart disease and stroke:  Have your blood pressure checked at least every 1-2 years. High blood pressure causes heart disease and increases the risk of stroke.  If you are 32-46 years old, ask your health care provider if you should take aspirin to prevent a heart attack or a stroke.  Do not use any tobacco products, including cigarettes, chewing tobacco, or electronic cigarettes. If you need help quitting, ask your health care provider.  It is important to eat a healthy diet and maintain a healthy weight. ? Be sure to include plenty of vegetables, fruits, low-fat dairy products, and lean protein. ? Avoid eating foods that are high in solid fats, added sugars, or salt (sodium).  Get regular exercise. This is one of the most important things that you can do for your health. ? Try to exercise for at least 150 minutes each week. The type of exercise that you do should increase your heart rate and make you sweat. This is known as moderate-intensity exercise. ? Try to do strengthening exercises at least twice each week. Do these in addition to the moderate-intensity exercise.  Know your numbers.Ask your health care provider to check your cholesterol and your blood  glucose. Continue to have your blood tested as directed by your health care provider.  What should I know about cancer screening? There are several types of cancer. Take the following steps to reduce your risk and to catch any cancer  development as early as possible. Breast Cancer  Practice breast self-awareness. ? This means understanding how your breasts normally appear and feel. ? It also means doing regular breast self-exams. Let your health care provider know about any changes, no matter how small.  If you are 64 or older, have a clinician do a breast exam (clinical breast exam or CBE) every year. Depending on your age, family history, and medical history, it may be recommended that you also have a yearly breast X-ray (mammogram).  If you have a family history of breast cancer, talk with your health care provider about genetic screening.  If you are at high risk for breast cancer, talk with your health care provider about having an MRI and a mammogram every year.  Breast cancer (BRCA) gene test is recommended for women who have family members with BRCA-related cancers. Results of the assessment will determine the need for genetic counseling and BRCA1 and for BRCA2 testing. BRCA-related cancers include these types: ? Breast. This occurs in males or females. ? Ovarian. ? Tubal. This may also be called fallopian tube cancer. ? Cancer of the abdominal or pelvic lining (peritoneal cancer). ? Prostate. ? Pancreatic.  Cervical, Uterine, and Ovarian Cancer Your health care provider may recommend that you be screened regularly for cancer of the pelvic organs. These include your ovaries, uterus, and vagina. This screening involves a pelvic exam, which includes checking for microscopic changes to the surface of your cervix (Pap test).  For women ages 21-65, health care providers may recommend a pelvic exam and a Pap test every three years. For women ages 11-65, they may recommend the Pap test and pelvic exam, combined with testing for human papilloma virus (HPV), every five years. Some types of HPV increase your risk of cervical cancer. Testing for HPV may also be done on women of any age who have unclear Pap test  results.  Other health care providers may not recommend any screening for nonpregnant women who are considered low risk for pelvic cancer and have no symptoms. Ask your health care provider if a screening pelvic exam is right for you.  If you have had past treatment for cervical cancer or a condition that could lead to cancer, you need Pap tests and screening for cancer for at least 20 years after your treatment. If Pap tests have been discontinued for you, your risk factors (such as having a new sexual partner) need to be reassessed to determine if you should start having screenings again. Some women have medical problems that increase the chance of getting cervical cancer. In these cases, your health care provider may recommend that you have screening and Pap tests more often.  If you have a family history of uterine cancer or ovarian cancer, talk with your health care provider about genetic screening.  If you have vaginal bleeding after reaching menopause, tell your health care provider.  There are currently no reliable tests available to screen for ovarian cancer.  Lung Cancer Lung cancer screening is recommended for adults 24-14 years old who are at high risk for lung cancer because of a history of smoking. A yearly low-dose CT scan of the lungs is recommended if you:  Currently smoke.  Have a history of at  least 30 pack-years of smoking and you currently smoke or have quit within the past 15 years. A pack-year is smoking an average of one pack of cigarettes per day for one year.  Yearly screening should:  Continue until it has been 15 years since you quit.  Stop if you develop a health problem that would prevent you from having lung cancer treatment.  Colorectal Cancer  This type of cancer can be detected and can often be prevented.  Routine colorectal cancer screening usually begins at age 60 and continues through age 68.  If you have risk factors for colon cancer, your health  care provider may recommend that you be screened at an earlier age.  If you have a family history of colorectal cancer, talk with your health care provider about genetic screening.  Your health care provider may also recommend using home test kits to check for hidden blood in your stool.  A small camera at the end of a tube can be used to examine your colon directly (sigmoidoscopy or colonoscopy). This is done to check for the earliest forms of colorectal cancer.  Direct examination of the colon should be repeated every 5-10 years until age 86. However, if early forms of precancerous polyps or small growths are found or if you have a family history or genetic risk for colorectal cancer, you may need to be screened more often.  Skin Cancer  Check your skin from head to toe regularly.  Monitor any moles. Be sure to tell your health care provider: ? About any new moles or changes in moles, especially if there is a change in a mole's shape or color. ? If you have a mole that is larger than the size of a pencil eraser.  If any of your family members has a history of skin cancer, especially at a young age, talk with your health care provider about genetic screening.  Always use sunscreen. Apply sunscreen liberally and repeatedly throughout the day.  Whenever you are outside, protect yourself by wearing long sleeves, pants, a wide-brimmed hat, and sunglasses.  What should I know about osteoporosis? Osteoporosis is a condition in which bone destruction happens more quickly than new bone creation. After menopause, you may be at an increased risk for osteoporosis. To help prevent osteoporosis or the bone fractures that can happen because of osteoporosis, the following is recommended:  If you are 25-28 years old, get at least 1,000 mg of calcium and at least 600 mg of vitamin D per day.  If you are older than age 50 but younger than age 39, get at least 1,200 mg of calcium and at least 600 mg of  vitamin D per day.  If you are older than age 37, get at least 1,200 mg of calcium and at least 800 mg of vitamin D per day.  Smoking and excessive alcohol intake increase the risk of osteoporosis. Eat foods that are rich in calcium and vitamin D, and do weight-bearing exercises several times each week as directed by your health care provider. What should I know about how menopause affects my mental health? Depression may occur at any age, but it is more common as you become older. Common symptoms of depression include:  Low or sad mood.  Changes in sleep patterns.  Changes in appetite or eating patterns.  Feeling an overall lack of motivation or enjoyment of activities that you previously enjoyed.  Frequent crying spells.  Talk with your health care provider if you  think that you are experiencing depression. What should I know about immunizations? It is important that you get and maintain your immunizations. These include:  Tetanus, diphtheria, and pertussis (Tdap) booster vaccine.  Influenza every year before the flu season begins.  Pneumonia vaccine.  Shingles vaccine.  Your health care provider may also recommend other immunizations. This information is not intended to replace advice given to you by your health care provider. Make sure you discuss any questions you have with your health care provider. Document Released: 10/20/2005 Document Revised: 03/17/2016 Document Reviewed: 06/01/2015 Elsevier Interactive Patient Education  2018 [employer].

## 2018-06-19 DIAGNOSIS — X32XXXA Exposure to sunlight, initial encounter: Secondary | ICD-10-CM | POA: Diagnosis not present

## 2018-06-19 DIAGNOSIS — L570 Actinic keratosis: Secondary | ICD-10-CM | POA: Diagnosis not present

## 2018-06-20 ENCOUNTER — Encounter: Payer: PPO | Admitting: Obstetrics and Gynecology

## 2018-06-21 ENCOUNTER — Encounter: Payer: Self-pay | Admitting: Internal Medicine

## 2018-06-21 ENCOUNTER — Telehealth: Payer: Self-pay | Admitting: Internal Medicine

## 2018-06-21 ENCOUNTER — Ambulatory Visit (INDEPENDENT_AMBULATORY_CARE_PROVIDER_SITE_OTHER): Payer: PPO | Admitting: Internal Medicine

## 2018-06-21 VITALS — BP 140/64 | HR 60 | Temp 98.0°F | Ht 69.0 in | Wt 175.0 lb

## 2018-06-21 DIAGNOSIS — I10 Essential (primary) hypertension: Secondary | ICD-10-CM

## 2018-06-21 DIAGNOSIS — F419 Anxiety disorder, unspecified: Secondary | ICD-10-CM

## 2018-06-21 DIAGNOSIS — Z23 Encounter for immunization: Secondary | ICD-10-CM | POA: Diagnosis not present

## 2018-06-21 DIAGNOSIS — I2510 Atherosclerotic heart disease of native coronary artery without angina pectoris: Secondary | ICD-10-CM | POA: Diagnosis not present

## 2018-06-21 DIAGNOSIS — G629 Polyneuropathy, unspecified: Secondary | ICD-10-CM | POA: Diagnosis not present

## 2018-06-21 DIAGNOSIS — E7800 Pure hypercholesterolemia, unspecified: Secondary | ICD-10-CM | POA: Diagnosis not present

## 2018-06-21 DIAGNOSIS — E538 Deficiency of other specified B group vitamins: Secondary | ICD-10-CM | POA: Diagnosis not present

## 2018-06-21 MED ORDER — CYANOCOBALAMIN 1000 MCG/ML IJ SOLN
1000.0000 ug | Freq: Once | INTRAMUSCULAR | Status: AC
  Administered 2018-06-21: 1000 ug via INTRAMUSCULAR

## 2018-06-21 NOTE — Telephone Encounter (Signed)
Please advise 

## 2018-06-21 NOTE — Telephone Encounter (Signed)
Copied from [place] [phone]. Topic: Quick Communication - See Telephone Encounter >> Jun 21, 2018  2:56 PM Waldemar Dickens, Valda Favia wrote: Patient called in  wanting to schedule monthly B12 injection.

## 2018-06-21 NOTE — Progress Notes (Signed)
[name], female   DOB: 10/02/1950, 67 y.o.   MRN: [id number]   Subjective:    [name], female    DOB: [birth year], 67 y.o.   MRN: [id number]  HPI  Patient here for a scheduled follow up.  She reports she is doing relatively well.  Recently saw cardiology (06/17/18).  Planning for stress test and echo.  Has stress test scheduled for 07/02/18.  States she feels nervous inside.  She feels this started when she started gabapentin.  No chest pain.  Breathing overall stable.  Saw Dr Enzo Bi 06/18/18 - ok.  Sees Dr Manuella Ghazi. On gabapentin for neuropathy.  Had recent MRI.  Reviewed.  Discussed with her.  Not sure if the gabapentin is helping.  Did recently start alpha lipoic acid.  Feels this may be helping more.  Eating.  No abdominal pain.  Bowels moving.  Saw Dr Tami Ribas for tinnitus.  Had carotid ultrasound - ok.     Past Medical History:  Diagnosis Date  . Acid reflux   . Coronary artery disease   . 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 Heart murmur   . History of chicken pox   . Hyperlipidemia   . Hypertension   . Insomnia    Past Surgical History:  Procedure Laterality Date  . APPENDECTOMY    . BREAST EXCISIONAL BIOPSY Right    negative over 5 years ago- neg  . BREAST SURGERY     Biopsy  . COLONOSCOPY WITH PROPOFOL N/A 05/16/2016   Procedure: COLONOSCOPY WITH PROPOFOL;  Surgeon: [name], MD;  Location: [facility] ENDOSCOPY;  Service: Endoscopy;  Laterality: N/A;  . CORONARY ARTERY BYPASS GRAFT  05/01/2014   3 vessel, [facility]  . ESOPHAGOGASTRODUODENOSCOPY (EGD) WITH PROPOFOL N/A 02/23/2017   Procedure: ESOPHAGOGASTRODUODENOSCOPY (EGD) WITH PROPOFOL;  Surgeon: [name], MD;  Location: [facility];  Service: Endoscopy;  Laterality: N/A;  . HYSTEROSCOPY W/D&C    . lipoma removed     removed from forehead  . triple bypass     Family History  Problem Relation Age of Onset  . Breast cancer Unknown        maternal great  aunt  . Heart disease Unknown        multiple family members  . Heart disease Mother   . Stroke Mother   . Alzheimer's disease Mother   . Heart disease Father   . Stroke Father   . Breast cancer Sister 57  . Alzheimer's disease Maternal Aunt   . Alzheimer's disease Maternal Uncle   . Alzheimer's disease Maternal Grandmother   . Colon cancer Neg Hx   . Diabetes Neg Hx   . Ovarian cancer Neg Hx    Social History   Socioeconomic History  . Marital status: Married    Spouse name: Not on file  . Number of children: Not on file  . Years of education: Not on file  . Highest education level: Not on file  Occupational History  . Not on file  Social Needs  . Financial resource strain: Not hard at all  . Food insecurity:    Worry: Never true    Inability: Never true  . Transportation needs:    Medical: No    Non-medical: No  Tobacco Use  . Smoking status: Never Smoker  . Smokeless tobacco: Never Used  Substance and Sexual Activity  . Alcohol use: Yes    Alcohol/week: 0.0  standard drinks    Comment: socially - 1x/3 wks  . Drug use: No  . Sexual activity: Yes    Birth control/protection: Post-menopausal  Lifestyle  . Physical activity:    Days per week: 3 days    Minutes per session: 50 min  . Stress: Not at all  Relationships  . Social connections:    Talks on phone: Not on file    Gets together: Not on file    Attends religious service: Not on file    Active member of club or organization: Not on file    Attends meetings of clubs or organizations: Not on file    Relationship status: Not on file  Other Topics Concern  . Not on file  Social History Narrative  . Not on file    Outpatient Encounter Medications as of 06/21/2018  Medication Sig  . Alpha-Lipoic Acid 100 MG CAPS Take by mouth.  Marland Kitchen amLODipine (NORVASC) 10 MG tablet Take 10 mg by mouth daily.  Marland Kitchen aspirin EC 81 MG tablet Take 81 mg by mouth daily.  Marland Kitchen atorvastatin (LIPITOR) 20 MG tablet TAKE 1 TABLET (20 MG  TOTAL) BY MOUTH ONCE DAILY.  Marland Kitchen Docusate Calcium (STOOL SOFTENER PO) Take by mouth.  . gabapentin (NEURONTIN) 100 MG capsule TAKE ONE CAPSULE BY MOUTH IN THE MORNING, 1 CAPSULE MIDDAY AND 3 CAPSULES IN EVENING  . hydrochlorothiazide (HYDRODIURIL) 25 MG tablet TAKE 1 TABLET (25 MG TOTAL) BY MOUTH ONCE DAILY.  . metoprolol tartrate (LOPRESSOR) 25 MG tablet Take 25 mg by mouth 2 (two) times daily.  . Omega-3 Fatty Acids (FISH OIL) 1000 MG CAPS Take 1,200 mg by mouth daily.  . pantoprazole (PROTONIX) 40 MG tablet TAKE 1 TABLET (40 MG TOTAL) BY MOUTH DAILY. TAKE 30 MINUTES BEFORE BREAKFAST  . traZODone (DESYREL) 50 MG tablet TAKE 1-2 TABLETS (50-100 MG TOTAL) BY MOUTH AT BEDTIME AS NEEDED.  Marland Kitchen valsartan (DIOVAN) 160 MG tablet Take by mouth.  Marland Kitchen VITAMIN D, ERGOCALCIFEROL, PO Take by mouth.  . [EXPIRED] cyanocobalamin ((VITAMIN B-12)) injection 1,000 mcg    No facility-administered encounter medications on file as of 06/21/2018.     Review of Systems  Constitutional: Negative for appetite change and unexpected weight change.  HENT: Negative for congestion and sinus pressure.   Respiratory: Negative for cough, chest tightness and shortness of breath.   Cardiovascular: Negative for chest pain, palpitations and leg swelling.  Gastrointestinal: Negative for abdominal pain, diarrhea, nausea and vomiting.  Genitourinary: Negative for difficulty urinating and dysuria.  Musculoskeletal: Negative for joint swelling and myalgias.  Skin: Negative for color change and rash.  Neurological: Negative for dizziness, light-headedness and headaches.  Psychiatric/Behavioral: Negative for agitation and dysphoric mood.       Objective:    Physical Exam  Constitutional: She appears well-developed and well-nourished. No distress.  HENT:  Nose: Nose normal.  Mouth/Throat: Oropharynx is clear and moist.  Neck: Neck supple. No thyromegaly present.  Cardiovascular: Normal rate and regular rhythm.  Pulmonary/Chest:  Breath sounds normal. No respiratory distress. She has no wheezes.  Abdominal: Soft. Bowel sounds are normal. There is no tenderness.  Musculoskeletal: She exhibits no edema or tenderness.  Lymphadenopathy:    She has no cervical adenopathy.  Skin: No rash noted. No erythema.  Psychiatric: She has a normal mood and affect. Her behavior is normal.    BP 140/64 (BP Location: Left Arm, Cuff Size: Normal)   Pulse 60   Temp 98 F (36.7 C) (Oral)   Ht  5\' 9"  (1.753 m)   Wt 175 lb (79.4 kg)   SpO2 98%   BMI 25.84 kg/m  Wt Readings from Last 3 Encounters:  06/21/18 175 lb (79.4 kg)  06/18/18 173 lb 14.4 oz (78.9 kg)  05/14/18 173 lb 1.9 oz (78.5 kg)     Lab Results  Component Value Date   WBC 5.9 02/15/2018   HGB 13.4 02/15/2018   HCT 39.4 02/15/2018   PLT 176.0 02/15/2018   GLUCOSE 91 04/09/2018   CHOL 106 04/09/2018   TRIG 88.0 04/09/2018   HDL 35.20 (L) 04/09/2018   LDLCALC 53 04/09/2018   ALT 17 04/09/2018   AST 16 04/09/2018   NA 141 04/09/2018   K 4.3 04/09/2018   CL 105 04/09/2018   CREATININE 0.87 04/09/2018   BUN 17 04/09/2018   CO2 31 04/09/2018   TSH 3.45 02/15/2018    Mr Lumbar Spine Wo Contrast  Result Date: 06/04/2018 CLINICAL DATA:  67 year old female with bilateral lower leg burning for 2-3 months. No known injury. EXAM: MRI LUMBAR SPINE WITHOUT CONTRAST TECHNIQUE: Multiplanar, multisequence MR imaging of the lumbar spine was performed. No intravenous contrast was administered. COMPARISON:  CT Abdomen and Pelvis 09/20/2012. Lumbar MRI 09/13/2012. FINDINGS: Segmentation:  Normal on the comparison CT. Alignment: Stable since 2014. Preserved lumbar lordosis. Mild grade 1 anterolisthesis of L5 on S1. superimposed mild dextroconvex lumbar scoliosis. Vertebrae: No marrow edema or evidence of acute osseous abnormality. Visualized bone marrow signal is within normal limits. Chronic benign vertebral body hemangioma at L2. Intact visible sacrum and SI joints. Conus  medullaris and cauda equina: Conus extends to the L1-L2 level. No lower spinal cord or conus signal abnormality. Paraspinal and other soft tissues: Negative. Disc levels: T10-T11: Partially visible and negative. T11-T12: Chronic mild disc desiccation and disc bulge. Mild facet hypertrophy. No stenosis. T12-L1:  Chronic mild disc bulge.  No stenosis. L1-L2:  Chronic mild disc bulge and facet hypertrophy.  No stenosis. L2-L3: Chronic disc space loss and circumferential disc bulge eccentric to the right. Mild to moderate facet and ligament flavum hypertrophy has increased since the prior MRI. New borderline to mild spinal stenosis (series 5, image 16). L3-L4: Chronic right eccentric disc bulge with broad-based right subarticular component and moderate facet and ligament flavum hypertrophy. Chronic mild to moderate right lateral recess stenosis (descending right L4 nerve level). Stable borderline to mild spinal stenosis. No convincing foraminal stenosis. L4-L5: Chronic circumferential disc bulge with broad-based posterior component. Chronic moderate to severe posterior element hypertrophy including ligament flavum and facet disease with chronic right facet joint effusion. Chronic increased STIR signal in the interspinous ligament here also appears degenerative (series 4, image 8). Still, epidural lipomatosis has regressed here since 2014 and thecal sac patency is improved. Borderline to mild spinal stenosis now without convincing lateral recess stenosis. Stable mild L4 foraminal stenosis greater on the right. L5-S1: Chronic anterolisthesis with left eccentric disc/pseudo disc bulge. Chronic severe facet hypertrophy which has progressed, although degenerative facet joint fluid and a left side synovial cyst previously affecting the left lateral recess have resolved. Mild to moderate residual left lateral recess stenosis (left S1 nerve level). Mild right lateral recess stenosis. No spinal stenosis. Stable mild left and  increased mild right L5 foraminal stenosis. IMPRESSION: 1. Chronic lower lumbar spine degeneration, but mildly improved appearance of both L4-L5 and L5-S1 since the 2014 MRI. Up to mild residual spinal stenosis at L4-L5, moderate lateral recess stenosis at L5-S1. 2. Stable L3-L4 degeneration with mild spinal and  up to moderate right lateral recess stenosis. 3. Increased facet degeneration at L2-L3 with up to mild new spinal stenosis. Electronically Signed   By: Genevie Ann M.D.   On: 06/04/2018 14:24       Assessment & Plan:   Problem List Items Addressed This Visit    Anxiety    Describes feeling nervous inside.  States noticed when started gabapentin.  Will hold on additional medication.  Taper gabapentin and see if symptoms improve.  Follow.        CAD (coronary artery disease)    Followed by cardiology.  Planning for stress test later this month.  Continue risk factor modification.        Essential hypertension    Blood pressure slightly elevated today.  Have her spot check her pressure.  Follow metabolic panel. Hold on medication changes at this time.        Hypercholesterolemia    On lipitor.  Low cholesterol diet and exercise.  Follow lipid panel and liver function tests.        Neuropathy    Had NCS - polyneuropathy.  Feels gabapentin may be contributing to some of her current symptoms.  Will taper.  Continue alpha lipoic acid.  Feels this is helping.  Sees neurology.         Other Visit Diagnoses    Need for influenza vaccination    -  Primary   Relevant Orders   Flu vaccine HIGH DOSE PF (Completed)   B12 deficiency       Relevant Medications   cyanocobalamin ((VITAMIN B-12)) injection 1,000 mcg (Completed)       Einar Pheasant, MD

## 2018-06-24 ENCOUNTER — Encounter: Payer: Self-pay | Admitting: Internal Medicine

## 2018-06-24 NOTE — Assessment & Plan Note (Signed)
Followed by cardiology.  Planning for stress test later this month.  Continue risk factor modification.

## 2018-06-24 NOTE — Assessment & Plan Note (Signed)
Had NCS - polyneuropathy.  Feels gabapentin may be contributing to some of her current symptoms.  Will taper.  Continue alpha lipoic acid.  Feels this is helping.  Sees neurology.

## 2018-06-24 NOTE — Assessment & Plan Note (Signed)
Blood pressure slightly elevated today.  Have her spot check her pressure.  Follow metabolic panel. Hold on medication changes at this time.

## 2018-06-24 NOTE — Assessment & Plan Note (Signed)
Describes feeling nervous inside.  States noticed when started gabapentin.  Will hold on additional medication.  Taper gabapentin and see if symptoms improve.  Follow.

## 2018-06-24 NOTE — Assessment & Plan Note (Signed)
On lipitor.  Low cholesterol diet and exercise.  Follow lipid panel and liver function tests.   

## 2018-06-25 NOTE — Telephone Encounter (Signed)
Can you please call pt and get her set up for a B12. She was due on 06/13/18

## 2018-06-27 NOTE — Telephone Encounter (Signed)
Please check to see when pt last b12 was so I can schedule from there

## 2018-06-27 NOTE — Telephone Encounter (Signed)
She had her last one on 06/21/18. I talked to her and she is just going to get her next one at her appt with Dr. Nicki Reaper on 11/18.

## 2018-07-02 ENCOUNTER — Encounter: Payer: PPO | Admitting: Obstetrics and Gynecology

## 2018-07-02 DIAGNOSIS — I10 Essential (primary) hypertension: Secondary | ICD-10-CM | POA: Diagnosis not present

## 2018-07-02 DIAGNOSIS — I6523 Occlusion and stenosis of bilateral carotid arteries: Secondary | ICD-10-CM | POA: Diagnosis not present

## 2018-07-02 DIAGNOSIS — R072 Precordial pain: Secondary | ICD-10-CM | POA: Diagnosis not present

## 2018-07-02 DIAGNOSIS — I341 Nonrheumatic mitral (valve) prolapse: Secondary | ICD-10-CM | POA: Diagnosis not present

## 2018-07-02 DIAGNOSIS — I25810 Atherosclerosis of coronary artery bypass graft(s) without angina pectoris: Secondary | ICD-10-CM | POA: Diagnosis not present

## 2018-07-03 ENCOUNTER — Encounter: Payer: Self-pay | Admitting: Internal Medicine

## 2018-07-04 DIAGNOSIS — Z1211 Encounter for screening for malignant neoplasm of colon: Secondary | ICD-10-CM | POA: Diagnosis not present

## 2018-07-09 LAB — FECAL OCCULT BLOOD, IMMUNOCHEMICAL: FECAL OCCULT BLD: NEGATIVE

## 2018-07-29 ENCOUNTER — Encounter: Payer: Self-pay | Admitting: Internal Medicine

## 2018-07-29 ENCOUNTER — Ambulatory Visit (INDEPENDENT_AMBULATORY_CARE_PROVIDER_SITE_OTHER): Payer: PPO | Admitting: Internal Medicine

## 2018-07-29 VITALS — BP 158/88 | HR 64 | Temp 98.0°F | Resp 18 | Wt 174.8 lb

## 2018-07-29 DIAGNOSIS — E538 Deficiency of other specified B group vitamins: Secondary | ICD-10-CM

## 2018-07-29 DIAGNOSIS — F419 Anxiety disorder, unspecified: Secondary | ICD-10-CM | POA: Diagnosis not present

## 2018-07-29 DIAGNOSIS — I10 Essential (primary) hypertension: Secondary | ICD-10-CM | POA: Diagnosis not present

## 2018-07-29 DIAGNOSIS — G629 Polyneuropathy, unspecified: Secondary | ICD-10-CM

## 2018-07-29 DIAGNOSIS — E7800 Pure hypercholesterolemia, unspecified: Secondary | ICD-10-CM

## 2018-07-29 DIAGNOSIS — R87629 Unspecified abnormal cytological findings in specimens from vagina: Secondary | ICD-10-CM | POA: Diagnosis not present

## 2018-07-29 DIAGNOSIS — I2510 Atherosclerotic heart disease of native coronary artery without angina pectoris: Secondary | ICD-10-CM

## 2018-07-29 MED ORDER — BUSPIRONE HCL 5 MG PO TABS: 5.0000 mg | ORAL_TABLET | Freq: Every day | ORAL | 1 refills | Status: DC

## 2018-07-29 MED ORDER — CYANOCOBALAMIN 1000 MCG/ML IJ SOLN
1000.0000 ug | Freq: Once | INTRAMUSCULAR | Status: AC
  Administered 2018-07-29: 1000 ug via INTRAMUSCULAR

## 2018-07-29 NOTE — Progress Notes (Signed)
[name], female   DOB: 1951/03/25, 67 y.o.   MRN: [id number]   Subjective:    [name], female    DOB: April 01, 1951, 67 y.o.   MRN: [id number]  HPI  Patient here for a scheduled follow up. She reports she is doing relatively well.  Saw cardiology 07/02/18.  Felt stable.  No changes made.  Still with pain in her feet/legs. Has been on gabapentin.  Also was taking alpha lipoic acid.  Has been off.  Was concerned regarding intolerance to gabapentin or alpha lipoic acid.  Also reports feeling tense.  Feels anxiety could be contributing to this.  Does report feeling some increased anxiety.  Discussed treatment options.  She is taking trazodone to help with sleep.  No chest pain.  Breathing stable.  No abdominal pain.  Bowels moving.     Past Medical History:  Diagnosis Date  . Acid reflux   . Coronary artery disease   . 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 Heart murmur   . History of chicken pox   . Hyperlipidemia   . Hypertension   . Insomnia    Past Surgical History:  Procedure Laterality Date  . APPENDECTOMY    . BREAST EXCISIONAL BIOPSY Right    negative over 5 years ago- neg  . BREAST SURGERY     Biopsy  . COLONOSCOPY WITH PROPOFOL N/A 05/16/2016   Procedure: COLONOSCOPY WITH PROPOFOL;  Surgeon: [name], MD;  Location: [facility];  Service: Endoscopy;  Laterality: N/A;  . CORONARY ARTERY BYPASS GRAFT  05/01/2014   3 vessel, [facility]  . ESOPHAGOGASTRODUODENOSCOPY (EGD) WITH PROPOFOL N/A 02/23/2017   Procedure: ESOPHAGOGASTRODUODENOSCOPY (EGD) WITH PROPOFOL;  Surgeon: [name], MD;  Location: [facility];  Service: Endoscopy;  Laterality: N/A;  . HYSTEROSCOPY W/D&C    . lipoma removed     removed from forehead  . triple bypass     Family History  Problem Relation Age of Onset  . Breast cancer Unknown        maternal great aunt  . Heart disease Unknown        multiple family members  . Heart disease  Mother   . Stroke Mother   . Alzheimer's disease Mother   . Heart disease Father   . Stroke Father   . Breast cancer Sister 73  . Alzheimer's disease Maternal Aunt   . Alzheimer's disease Maternal Uncle   . Alzheimer's disease Maternal Grandmother   . Colon cancer Neg Hx   . Diabetes Neg Hx   . Ovarian cancer Neg Hx    Social History   Socioeconomic History  . Marital status: Married    Spouse name: Not on file  . Number of children: Not on file  . Years of education: Not on file  . Highest education level: Not on file  Occupational History  . Not on file  Social Needs  . Financial resource strain: Not hard at all  . Food insecurity:    Worry: Never true    Inability: Never true  . Transportation needs:    Medical: No    Non-medical: No  Tobacco Use  . Smoking status: Never Smoker  . Smokeless tobacco: Never Used  Substance and Sexual Activity  . Alcohol use: Yes    Alcohol/week: 0.0 standard drinks    Comment: socially - 1x/3 wks  . Drug use: No  . Sexual activity: Yes  Birth control/protection: Post-menopausal  Lifestyle  . Physical activity:    Days per week: 3 days    Minutes per session: 50 min  . Stress: Not at all  Relationships  . Social connections:    Talks on phone: Not on file    Gets together: Not on file    Attends religious service: Not on file    Active member of club or organization: Not on file    Attends meetings of clubs or organizations: Not on file    Relationship status: Not on file  Other Topics Concern  . Not on file  Social History Narrative  . Not on file    Outpatient Encounter Medications as of 07/29/2018  Medication Sig  . Alpha-Lipoic Acid 100 MG CAPS Take by mouth.  Marland Kitchen amLODipine (NORVASC) 10 MG tablet Take 10 mg by mouth daily.  Marland Kitchen aspirin EC 81 MG tablet Take 81 mg by mouth daily.  Marland Kitchen atorvastatin (LIPITOR) 20 MG tablet TAKE 1 TABLET (20 MG TOTAL) BY MOUTH ONCE DAILY.  . busPIRone (BUSPAR) 5 MG tablet Take 1 tablet (5  mg total) by mouth daily.  Mariane Baumgarten Calcium (STOOL SOFTENER PO) Take by mouth.  . gabapentin (NEURONTIN) 100 MG capsule TAKE ONE CAPSULE BY MOUTH IN THE MORNING, 1 CAPSULE MIDDAY AND 3 CAPSULES IN EVENING  . hydrochlorothiazide (HYDRODIURIL) 25 MG tablet TAKE 1 TABLET (25 MG TOTAL) BY MOUTH ONCE DAILY.  . metoprolol tartrate (LOPRESSOR) 25 MG tablet Take 25 mg by mouth 2 (two) times daily.  . Omega-3 Fatty Acids (FISH OIL) 1000 MG CAPS Take 1,200 mg by mouth daily.  . pantoprazole (PROTONIX) 40 MG tablet TAKE 1 TABLET (40 MG TOTAL) BY MOUTH DAILY. TAKE 30 MINUTES BEFORE BREAKFAST  . valsartan (DIOVAN) 160 MG tablet Take by mouth.  Marland Kitchen VITAMIN D, ERGOCALCIFEROL, PO Take by mouth.  . [DISCONTINUED] traZODone (DESYREL) 50 MG tablet TAKE 1-2 TABLETS (50-100 MG TOTAL) BY MOUTH AT BEDTIME AS NEEDED.  . [EXPIRED] cyanocobalamin ((VITAMIN B-12)) injection 1,000 mcg    No facility-administered encounter medications on file as of 07/29/2018.     Review of Systems  Constitutional: Negative for appetite change and unexpected weight change.  HENT: Negative for congestion and sinus pressure.   Respiratory: Negative for cough, chest tightness and shortness of breath.   Cardiovascular: Negative for chest pain, palpitations and leg swelling.  Gastrointestinal: Negative for abdominal pain, diarrhea, nausea and vomiting.  Genitourinary: Negative for difficulty urinating and dysuria.  Musculoskeletal: Negative for joint swelling and myalgias.  Skin: Negative for color change and rash.  Neurological: Negative for dizziness and headaches.  Psychiatric/Behavioral: Negative for dysphoric mood.       Increased stress.  Increased anxiety as outlined.  Discussed treatment options.         Objective:    Physical Exam  Constitutional: She appears well-developed and well-nourished. No distress.  HENT:  Nose: Nose normal.  Mouth/Throat: Oropharynx is clear and moist.  Neck: Neck supple. No thyromegaly present.   Cardiovascular: Normal rate and regular rhythm.  Pulmonary/Chest: Breath sounds normal. No respiratory distress. She has no wheezes.  Abdominal: Soft. Bowel sounds are normal. There is no tenderness.  Musculoskeletal: She exhibits no edema or tenderness.  Lymphadenopathy:    She has no cervical adenopathy.  Skin: No rash noted. No erythema.  Psychiatric: She has a normal mood and affect. Her behavior is normal.    BP (!) 158/88 (BP Location: Left Arm, Patient Position: Sitting, Cuff Size: Large)  Pulse 64   Temp 98 F (36.7 C) (Oral)   Resp 18   Wt 174 lb 12.8 oz (79.3 kg)   SpO2 99%   BMI 25.81 kg/m  Wt Readings from Last 3 Encounters:  07/29/18 174 lb 12.8 oz (79.3 kg)  06/21/18 175 lb (79.4 kg)  06/18/18 173 lb 14.4 oz (78.9 kg)     Lab Results  Component Value Date   WBC 5.9 02/15/2018   HGB 13.4 02/15/2018   HCT 39.4 02/15/2018   PLT 176.0 02/15/2018   GLUCOSE 91 04/09/2018   CHOL 106 04/09/2018   TRIG 88.0 04/09/2018   HDL 35.20 (L) 04/09/2018   LDLCALC 53 04/09/2018   ALT 17 04/09/2018   AST 16 04/09/2018   NA 141 04/09/2018   K 4.3 04/09/2018   CL 105 04/09/2018   CREATININE 0.87 04/09/2018   BUN 17 04/09/2018   CO2 31 04/09/2018   TSH 3.45 02/15/2018    Mr Lumbar Spine Wo Contrast  Result Date: 06/04/2018 CLINICAL DATA:  67 year old female with bilateral lower leg burning for 2-3 months. No known injury. EXAM: MRI LUMBAR SPINE WITHOUT CONTRAST TECHNIQUE: Multiplanar, multisequence MR imaging of the lumbar spine was performed. No intravenous contrast was administered. COMPARISON:  CT Abdomen and Pelvis 09/20/2012. Lumbar MRI 09/13/2012. FINDINGS: Segmentation:  Normal on the comparison CT. Alignment: Stable since 2014. Preserved lumbar lordosis. Mild grade 1 anterolisthesis of L5 on S1. superimposed mild dextroconvex lumbar scoliosis. Vertebrae: No marrow edema or evidence of acute osseous abnormality. Visualized bone marrow signal is within normal limits.  Chronic benign vertebral body hemangioma at L2. Intact visible sacrum and SI joints. Conus medullaris and cauda equina: Conus extends to the L1-L2 level. No lower spinal cord or conus signal abnormality. Paraspinal and other soft tissues: Negative. Disc levels: T10-T11: Partially visible and negative. T11-T12: Chronic mild disc desiccation and disc bulge. Mild facet hypertrophy. No stenosis. T12-L1:  Chronic mild disc bulge.  No stenosis. L1-L2:  Chronic mild disc bulge and facet hypertrophy.  No stenosis. L2-L3: Chronic disc space loss and circumferential disc bulge eccentric to the right. Mild to moderate facet and ligament flavum hypertrophy has increased since the prior MRI. New borderline to mild spinal stenosis (series 5, image 16). L3-L4: Chronic right eccentric disc bulge with broad-based right subarticular component and moderate facet and ligament flavum hypertrophy. Chronic mild to moderate right lateral recess stenosis (descending right L4 nerve level). Stable borderline to mild spinal stenosis. No convincing foraminal stenosis. L4-L5: Chronic circumferential disc bulge with broad-based posterior component. Chronic moderate to severe posterior element hypertrophy including ligament flavum and facet disease with chronic right facet joint effusion. Chronic increased STIR signal in the interspinous ligament here also appears degenerative (series 4, image 8). Still, epidural lipomatosis has regressed here since 2014 and thecal sac patency is improved. Borderline to mild spinal stenosis now without convincing lateral recess stenosis. Stable mild L4 foraminal stenosis greater on the right. L5-S1: Chronic anterolisthesis with left eccentric disc/pseudo disc bulge. Chronic severe facet hypertrophy which has progressed, although degenerative facet joint fluid and a left side synovial cyst previously affecting the left lateral recess have resolved. Mild to moderate residual left lateral recess stenosis (left S1  nerve level). Mild right lateral recess stenosis. No spinal stenosis. Stable mild left and increased mild right L5 foraminal stenosis. IMPRESSION: 1. Chronic lower lumbar spine degeneration, but mildly improved appearance of both L4-L5 and L5-S1 since the 2014 MRI. Up to mild residual spinal stenosis at L4-L5, moderate lateral  recess stenosis at L5-S1. 2. Stable L3-L4 degeneration with mild spinal and up to moderate right lateral recess stenosis. 3. Increased facet degeneration at L2-L3 with up to mild new spinal stenosis. Electronically Signed   By: Genevie Ann M.D.   On: 06/04/2018 14:24       Assessment & Plan:   Problem List Items Addressed This Visit    Abnormal vaginal Pap smear    Followed by Dr Enzo Bi.  Just evaluated.        Anxiety    Increased anxiety as outlined.  On trazodone.  Helping sleep.  Discussed treatment options.  Will start buspar.  Follow.        Relevant Medications   busPIRone (BUSPAR) 5 MG tablet   CAD (coronary artery disease)    Followed by cardiology.  Just evaluated.  Stable.  No changes made.       Essential hypertension    Blood pressure has been under reasonable control.  Continue same medication regimen.  Follow pressures.  Follow metabolic panel.  Feel elevated today secondary to increased anxiety, etc.        Relevant Orders   Basic metabolic panel   Hypercholesterolemia    On lipitor.  Low cholesterol diet and exercise.  Follow lipid panel and liver function tests.       Relevant Orders   Lipid panel   Hepatic function panel   Neuropathy    Had NCS - polyneuropathy.  On gabapentin.  On two bid now.  Restart alpha lipoic acid daily.  Follow.  Keep f/u with neurology.         Other Visit Diagnoses    B12 deficiency    -  Primary   Relevant Medications   cyanocobalamin ((VITAMIN B-12)) injection 1,000 mcg (Completed)   Other Relevant Orders   Vitamin B12       Einar Pheasant, MD

## 2018-07-31 ENCOUNTER — Other Ambulatory Visit: Payer: Self-pay | Admitting: Internal Medicine

## 2018-08-02 ENCOUNTER — Encounter: Payer: Self-pay | Admitting: Internal Medicine

## 2018-08-02 NOTE — Telephone Encounter (Signed)
Confirmed with patient that she is not having any other sx and the rash has not spread. She has had the rash on her chest for 2-3 days. Using hydrocortisone cream. Advised patient that I would send this message to PCP and let her know, until then she should continue to monitor and be evaluated if worsening sx.

## 2018-08-02 NOTE — Telephone Encounter (Signed)
Patient is aware. She will hold buspar and call with update

## 2018-08-02 NOTE — Telephone Encounter (Signed)
Have her go ahead and hold buspar given unclear if causing the rash.  Call with update.  Agree any worsening symptoms or if persistent, will need to be evaluated.

## 2018-08-04 ENCOUNTER — Encounter: Payer: Self-pay | Admitting: Internal Medicine

## 2018-08-04 NOTE — Assessment & Plan Note (Signed)
Had NCS - polyneuropathy.  On gabapentin.  On two bid now.  Restart alpha lipoic acid daily.  Follow.  Keep f/u with neurology.

## 2018-08-04 NOTE — Assessment & Plan Note (Signed)
Blood pressure has been under reasonable control.  Continue same medication regimen.  Follow pressures.  Follow metabolic panel.  Feel elevated today secondary to increased anxiety, etc.

## 2018-08-04 NOTE — Assessment & Plan Note (Signed)
Increased anxiety as outlined.  On trazodone.  Helping sleep.  Discussed treatment options.  Will start buspar.  Follow.

## 2018-08-04 NOTE — Assessment & Plan Note (Signed)
Followed by cardiology.  Just evaluated.  Stable.  No changes made.

## 2018-08-04 NOTE — Assessment & Plan Note (Signed)
Followed by Dr Enzo Bi.  Just evaluated.

## 2018-08-04 NOTE — Assessment & Plan Note (Signed)
On lipitor.  Low cholesterol diet and exercise.  Follow lipid panel and liver function tests.   

## 2018-08-12 ENCOUNTER — Encounter: Payer: Self-pay | Admitting: Internal Medicine

## 2018-08-12 NOTE — Telephone Encounter (Signed)
Pt aware and is going to monitor for another week and see if it resolves

## 2018-08-12 NOTE — Telephone Encounter (Signed)
Would recommend continuing the gabapentin on the dose she is taking, since helping her legs.  Remain off buspar.  If persistent, can schedule appt for me to evaluate.  It may take a while for it to completely resolve, but it should be getting better.  If wants me to evaluate - can schedule an appt.

## 2018-08-20 ENCOUNTER — Ambulatory Visit (INDEPENDENT_AMBULATORY_CARE_PROVIDER_SITE_OTHER): Payer: PPO | Admitting: Family Medicine

## 2018-08-20 ENCOUNTER — Other Ambulatory Visit (INDEPENDENT_AMBULATORY_CARE_PROVIDER_SITE_OTHER): Payer: PPO

## 2018-08-20 ENCOUNTER — Encounter: Payer: Self-pay | Admitting: Family Medicine

## 2018-08-20 VITALS — BP 132/70 | HR 70 | Temp 98.5°F | Ht 70.0 in | Wt 176.8 lb

## 2018-08-20 DIAGNOSIS — E7800 Pure hypercholesterolemia, unspecified: Secondary | ICD-10-CM

## 2018-08-20 DIAGNOSIS — E538 Deficiency of other specified B group vitamins: Secondary | ICD-10-CM

## 2018-08-20 DIAGNOSIS — I10 Essential (primary) hypertension: Secondary | ICD-10-CM

## 2018-08-20 DIAGNOSIS — R21 Rash and other nonspecific skin eruption: Secondary | ICD-10-CM

## 2018-08-20 LAB — HEPATIC FUNCTION PANEL
ALBUMIN: 4.5 g/dL (ref 3.5–5.2)
ALK PHOS: 42 U/L (ref 39–117)
ALT: 18 U/L (ref 0–35)
AST: 18 U/L (ref 0–37)
BILIRUBIN DIRECT: 0.1 mg/dL (ref 0.0–0.3)
TOTAL PROTEIN: 6.9 g/dL (ref 6.0–8.3)
Total Bilirubin: 0.6 mg/dL (ref 0.2–1.2)

## 2018-08-20 LAB — BASIC METABOLIC PANEL
BUN: 17 mg/dL (ref 6–23)
CO2: 30 meq/L (ref 19–32)
Calcium: 10.6 mg/dL — ABNORMAL HIGH (ref 8.4–10.5)
Chloride: 104 mEq/L (ref 96–112)
Creatinine, Ser: 0.85 mg/dL (ref 0.40–1.20)
GFR: 70.85 mL/min (ref >60.00)
GLUCOSE: 88 mg/dL (ref 70–99)
POTASSIUM: 3.9 meq/L (ref 3.5–5.1)
SODIUM: 141 meq/L (ref 135–145)

## 2018-08-20 LAB — LIPID PANEL
CHOL/HDL RATIO: 3
Cholesterol: 108 mg/dL (ref 0–200)
HDL: 36.4 mg/dL — ABNORMAL LOW (ref >39.00)
LDL Cholesterol: 55 mg/dL (ref 0–99)
NONHDL: 71.15
Triglycerides: 82 mg/dL (ref 0.0–149.0)
VLDL: 16.4 mg/dL (ref 0.0–40.0)

## 2018-08-20 LAB — VITAMIN B12: VITAMIN B 12: 475 pg/mL (ref 211–911)

## 2018-08-20 MED ORDER — LORATADINE 10 MG PO TABS: 10.0000 mg | ORAL_TABLET | Freq: Every day | ORAL | 0 refills | Status: DC

## 2018-08-20 MED ORDER — FAMOTIDINE 40 MG PO TABS: 40.0000 mg | ORAL_TABLET | Freq: Every day | ORAL | 0 refills | Status: DC

## 2018-08-20 MED ORDER — METHYLPREDNISOLONE 4 MG PO TBPK: ORAL_TABLET | ORAL | 0 refills | Status: DC

## 2018-08-20 MED ORDER — TRIAMCINOLONE ACETONIDE 0.1 % EX CREA: 1.0000 "application " | TOPICAL_CREAM | Freq: Two times a day (BID) | CUTANEOUS | 0 refills | Status: DC

## 2018-08-20 NOTE — Patient Instructions (Signed)
Rash should be improving over the next 7-14 days, if not please let us know

## 2018-08-20 NOTE — Progress Notes (Signed)
Subjective:    [name], female    DOB: 11/02/1950, 67 y.o.   MRN: [id number]  HPI  Presents to clinic c/o rash on chest.  Patient states the rash began about 3 to 4 days after taking BuSpar for her anxiety.  Patient states that she stopped taking the BuSpar after reaching out to the office here.  The rash has not seemed to improve since stopping BuSpar, and she has been off the BuSpar for almost 3 weeks now.  Patient states the rash is not itchy or painful, is just very noticeable on her chest and also states that her husband noticed on her back as well.  Denies any new foods, lotions, soaps, detergents, places.  Patient Active Problem List   Diagnosis Date Noted  . Neuropathy 02/15/2018  . Vaginal atrophy 06/12/2017  . Menopause 06/12/2017  . Osteopenia 06/12/2017  . Heartburn   . Chest pain 01/08/2017  . Palpitations 11/28/2016  . Headache 07/07/2016  . Special screening for malignant neoplasms, colon   . Benign neoplasm of transverse colon   . Family history of breast cancer in first degree relative 10/25/2015  . Fibrocystic breast changes of both breasts 10/25/2015  . Abdominal discomfort 08/22/2015  . Abnormal vaginal Pap smear 05/18/2015  . Adaptation reaction 05/05/2015  . Anxiety 05/05/2015  . History of chicken pox 05/05/2015  . HLD (hyperlipidemia) 05/05/2015  . 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 Osteoarthrosis 05/05/2015  . Adiposity 05/05/2015  . H/O coronary artery bypass surgery 05/05/2015  . CAD (coronary artery disease) 03/14/2015  . Essential hypertension 03/14/2015  . Hypercholesterolemia 03/14/2015  . Health care maintenance 03/14/2015  . Insomnia 03/08/2015  . Esophagitis, reflux 12/11/2014  . History of atrial fibrillation 06/29/2014  . Arteriosclerosis of bypass graft of coronary artery 06/01/2014  . Billowing mitral valve 06/01/2014  . 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 Smoking status: Never Smoker  . Smokeless tobacco: Never Used  Substance Use Topics  . Alcohol use: Yes    Alcohol/week: 0.0 standard drinks    Comment: socially - 1x/3 wks   Review of Systems  Constitutional: Negative for chills, fatigue and fever.  HENT: Negative for congestion, ear pain, sinus pain and sore throat.   Eyes: Negative.   Respiratory: Negative for cough, shortness of breath and wheezing.   Cardiovascular: Negative for chest pain, palpitations and leg swelling.  Gastrointestinal: Negative for abdominal pain, diarrhea, nausea and vomiting.  Genitourinary: Negative for dysuria, frequency and urgency.  Musculoskeletal: Negative for arthralgias and myalgias.  Skin: +rash chest and back Neurological: Negative for syncope, light-headedness and headaches.  Psychiatric/Behavioral: The patient is not nervous/anxious.    Objective:   Physical Exam  Skin: Rash noted. Rash is maculopapular.     Rash in areas represented by red circles on diagram  Constitutional:  She appears well-developed and well-nourished. No distress.  HENT:  Head: Normocephalic and atraumatic. Nose/throat: Normal Eyes:  EOM are normal. No scleral icterus.  Neck: Normal range of motion. Neck supple. No tracheal deviation present.  Cardiovascular: Normal rate, regular rhythm and normal heart sounds.  Pulmonary/Chest: Effort normal and breath sounds normal. No respiratory distress. She has no wheezes. She has no rales.   Neurological: She is alert and oriented to person, place, and time.  Gait normal  Psychiatric: She has a normal mood and affect. Her behavior is normal. Thought content normal.  Nursing note and vitals reviewed.     Vitals:   08/20/18 1406  BP: 132/70  Pulse: 70  Temp: 98.5 F (36.9 C)  SpO2: 97%    Assessment & Plan:   Acute eruption of skin- it is possible the rash was from the BuSpar, but patient has been off BuSpar for almost 3 weeks  so it should have resolved by now.  Other ideas of rash cause could be from a soap, detergent or lotion however patient denies any new items.  Patient will take steroid taper, she will use triamcinolone cream on rash area, she will do histamine blockade with Pepcid and Claritin.  Patient advised the rash should begin to improve over the next 7 to 14 days and if it does not she is to let us know and we can consider possible referral to dermatology.  Keep regular follow-up with PCP as planned.  Return to clinic sooner if any issues arise.

## 2018-08-21 ENCOUNTER — Encounter: Payer: Self-pay | Admitting: Family Medicine

## 2018-08-21 ENCOUNTER — Other Ambulatory Visit: Payer: Self-pay | Admitting: Internal Medicine

## 2018-08-21 NOTE — Progress Notes (Signed)
Order placed for f/u calcium.  

## 2018-08-23 ENCOUNTER — Ambulatory Visit: Payer: PPO | Admitting: Internal Medicine

## 2018-08-29 DIAGNOSIS — R51 Headache: Secondary | ICD-10-CM | POA: Diagnosis not present

## 2018-08-29 DIAGNOSIS — H93A2 Pulsatile tinnitus, left ear: Secondary | ICD-10-CM | POA: Diagnosis not present

## 2018-08-30 ENCOUNTER — Other Ambulatory Visit (HOSPITAL_COMMUNITY): Payer: Self-pay | Admitting: Unknown Physician Specialty

## 2018-08-30 ENCOUNTER — Other Ambulatory Visit: Payer: Self-pay | Admitting: Unknown Physician Specialty

## 2018-08-30 DIAGNOSIS — H93A2 Pulsatile tinnitus, left ear: Secondary | ICD-10-CM

## 2018-08-30 DIAGNOSIS — R519 Headache, unspecified: Secondary | ICD-10-CM

## 2018-08-30 DIAGNOSIS — R51 Headache: Secondary | ICD-10-CM

## 2018-09-12 ENCOUNTER — Other Ambulatory Visit (INDEPENDENT_AMBULATORY_CARE_PROVIDER_SITE_OTHER): Payer: PPO

## 2018-09-12 ENCOUNTER — Ambulatory Visit (INDEPENDENT_AMBULATORY_CARE_PROVIDER_SITE_OTHER): Payer: PPO | Admitting: *Deleted

## 2018-09-12 ENCOUNTER — Encounter: Payer: Self-pay | Admitting: Internal Medicine

## 2018-09-12 DIAGNOSIS — E538 Deficiency of other specified B group vitamins: Secondary | ICD-10-CM | POA: Diagnosis not present

## 2018-09-12 LAB — CALCIUM: Calcium: 10.2 mg/dL (ref 8.4–10.5)

## 2018-09-12 MED ORDER — CYANOCOBALAMIN 1000 MCG/ML IJ SOLN
1000.0000 ug | Freq: Once | INTRAMUSCULAR | Status: AC
  Administered 2018-09-12: 1000 ug via INTRAMUSCULAR

## 2018-09-12 NOTE — Progress Notes (Signed)
Patient presented for B 12 injection to left deltoid, patient voiced no concerns nor showed any signs of distress during injection. 

## 2018-09-21 ENCOUNTER — Other Ambulatory Visit: Payer: Self-pay | Admitting: Internal Medicine

## 2018-09-23 ENCOUNTER — Ambulatory Visit
Admission: RE | Admit: 2018-09-23 | Discharge: 2018-09-23 | Disposition: A | Discharge status: Home / Self Care | Payer: PPO | Source: Ambulatory Visit | Attending: Unknown Physician Specialty | Admitting: Unknown Physician Specialty

## 2018-09-23 DIAGNOSIS — H93A3 Pulsatile tinnitus, bilateral: Secondary | ICD-10-CM | POA: Diagnosis not present

## 2018-09-23 DIAGNOSIS — R51 Headache: Secondary | ICD-10-CM | POA: Diagnosis not present

## 2018-09-23 DIAGNOSIS — H93A2 Pulsatile tinnitus, left ear: Secondary | ICD-10-CM | POA: Diagnosis not present

## 2018-09-23 DIAGNOSIS — R519 Headache, unspecified: Secondary | ICD-10-CM

## 2018-09-23 MED ORDER — GADOBUTROL 1 MMOL/ML IV SOLN
7.5000 mL | Freq: Once | INTRAVENOUS | Status: AC | PRN
  Administered 2018-09-23: 7.5 mL via INTRAVENOUS

## 2018-09-24 ENCOUNTER — Encounter: Payer: Self-pay | Admitting: Internal Medicine

## 2018-09-24 ENCOUNTER — Ambulatory Visit (INDEPENDENT_AMBULATORY_CARE_PROVIDER_SITE_OTHER): Payer: PPO | Admitting: Internal Medicine

## 2018-09-24 DIAGNOSIS — E7800 Pure hypercholesterolemia, unspecified: Secondary | ICD-10-CM | POA: Diagnosis not present

## 2018-09-24 DIAGNOSIS — G629 Polyneuropathy, unspecified: Secondary | ICD-10-CM

## 2018-09-24 DIAGNOSIS — I10 Essential (primary) hypertension: Secondary | ICD-10-CM | POA: Diagnosis not present

## 2018-09-24 DIAGNOSIS — I2510 Atherosclerotic heart disease of native coronary artery without angina pectoris: Secondary | ICD-10-CM

## 2018-09-24 DIAGNOSIS — L258 Unspecified contact dermatitis due to other agents: Secondary | ICD-10-CM | POA: Diagnosis not present

## 2018-09-24 DIAGNOSIS — L309 Dermatitis, unspecified: Secondary | ICD-10-CM | POA: Diagnosis not present

## 2018-09-24 NOTE — Progress Notes (Signed)
[name], female   DOB: May 02, 1951, 68 y.o.   MRN: [id number]   Subjective:    [name], female    DOB: 11-01-1950, 68 y.o.   MRN: [id number]  HPI  Patient here for a scheduled follow up.  She reports she is doing relatively well.  Had issues with rash recently.  Was given steroid, TCC,anthistamine and famotidine.  Resolved.  Rash recurred over the last few days.  Localized over her upper back and chest.  Similar to previous rash.  No new lotions, detergents or new exposures.  Was questioning if related to medication,etc.  No sob.  No chest pain.  Eating well.  No abdominal pain.  Bowels moving.  Was previously having indigestion with increased gabapentin dose.  When decreased dose, symptoms resolved.  Still with issues with neuropathy.  Discussed other treatment options including lyrica.     Past Medical History:  Diagnosis Date  . Acid reflux   . Coronary artery disease   . 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 Heart murmur   . History of chicken pox   . Hyperlipidemia   . Hypertension   . Insomnia    Past Surgical History:  Procedure Laterality Date  . APPENDECTOMY    . BREAST EXCISIONAL BIOPSY Right    negative over 5 years ago- neg  . BREAST SURGERY     Biopsy  . COLONOSCOPY WITH PROPOFOL N/A 05/16/2016   Procedure: COLONOSCOPY WITH PROPOFOL;  Surgeon: [name], MD;  Location: [facility];  Service: Endoscopy;  Laterality: N/A;  . CORONARY ARTERY BYPASS GRAFT  05/01/2014   3 vessel, [facility]  . ESOPHAGOGASTRODUODENOSCOPY (EGD) WITH PROPOFOL N/A 02/23/2017   Procedure: ESOPHAGOGASTRODUODENOSCOPY (EGD) WITH PROPOFOL;  Surgeon: [name], MD;  Location: [place];  Service: Endoscopy;  Laterality: N/A;  . HYSTEROSCOPY W/D&C    . lipoma removed     removed from forehead  . triple bypass     Family History  Problem Relation Age of Onset  . Breast cancer Other        maternal great aunt  . Heart  disease Other        multiple family members  . Heart disease Mother   . Stroke Mother   . Alzheimer's disease Mother   . Heart disease Father   . Stroke Father   . Breast cancer Sister 49  . Alzheimer's disease Maternal Aunt   . Alzheimer's disease Maternal Uncle   . Alzheimer's disease Maternal Grandmother   . Colon cancer Neg Hx   . Diabetes Neg Hx   . Ovarian cancer Neg Hx    Social History   Socioeconomic History  . Marital status: Married    Spouse name: Not on file  . Number of children: Not on file  . Years of education: Not on file  . Highest education level: Not on file  Occupational History  . Not on file  Social Needs  . Financial resource strain: Not hard at all  . Food insecurity:    Worry: Never true    Inability: Never true  . Transportation needs:    Medical: No    Non-medical: No  Tobacco Use  . Smoking status: Never Smoker  . Smokeless tobacco: Never Used  Substance and Sexual Activity  . Alcohol use: Yes    Alcohol/week: 0.0 standard drinks    Comment: socially - 1x/3 wks  . Drug use:  No  . Sexual activity: Yes    Birth control/protection: Post-menopausal  Lifestyle  . Physical activity:    Days per week: 3 days    Minutes per session: 50 min  . Stress: Not at all  Relationships  . Social connections:    Talks on phone: Not on file    Gets together: Not on file    Attends religious service: Not on file    Active member of club or organization: Not on file    Attends meetings of clubs or organizations: Not on file    Relationship status: Not on file  Other Topics Concern  . Not on file  Social History Narrative  . Not on file    Outpatient Encounter Medications as of 09/24/2018  Medication Sig  . Alpha-Lipoic Acid 100 MG CAPS Take by mouth.  Marland Kitchen amLODipine (NORVASC) 10 MG tablet Take 10 mg by mouth daily.  Marland Kitchen aspirin EC 81 MG tablet Take 81 mg by mouth daily.  Marland Kitchen atorvastatin (LIPITOR) 20 MG tablet TAKE 1 TABLET (20 MG TOTAL) BY MOUTH  ONCE DAILY.  Marland Kitchen Docusate Calcium (STOOL SOFTENER PO) Take by mouth.  . gabapentin (NEURONTIN) 100 MG capsule TAKE ONE CAPSULE BY MOUTH IN THE MORNING, 1 CAPSULE MIDDAY AND 3 CAPSULES IN EVENING  . hydrochlorothiazide (HYDRODIURIL) 25 MG tablet TAKE 1 TABLET (25 MG TOTAL) BY MOUTH ONCE DAILY.  Marland Kitchen loratadine (CLARITIN) 10 MG tablet Take 1 tablet (10 mg total) by mouth daily.  . metoprolol tartrate (LOPRESSOR) 25 MG tablet Take 25 mg by mouth 2 (two) times daily.  . Omega-3 Fatty Acids (FISH OIL) 1000 MG CAPS Take 1,200 mg by mouth daily.  . pantoprazole (PROTONIX) 40 MG tablet TAKE 1 TABLET (40 MG TOTAL) BY MOUTH DAILY. TAKE 30 MINUTES BEFORE BREAKFAST  . traZODone (DESYREL) 50 MG tablet TAKE 1-2 TABLETS (50-100 MG TOTAL) BY MOUTH AT BEDTIME AS NEEDED.  Marland Kitchen triamcinolone cream (KENALOG) 0.1 % Apply 1 application topically 2 (two) times daily. Use for 7 days.  . valsartan (DIOVAN) 160 MG tablet Take by mouth.  Marland Kitchen VITAMIN D, ERGOCALCIFEROL, PO Take by mouth.  . [DISCONTINUED] famotidine (PEPCID) 40 MG tablet Take 1 tablet (40 mg total) by mouth daily for 14 days.  . [DISCONTINUED] methylPREDNISolone (MEDROL DOSEPAK) 4 MG TBPK tablet Take according to pack instructions   No facility-administered encounter medications on file as of 09/24/2018.     Review of Systems  Constitutional: Negative for appetite change and unexpected weight change.  HENT: Negative for congestion and sinus pressure.   Respiratory: Negative for cough, chest tightness and shortness of breath.   Cardiovascular: Negative for chest pain, palpitations and leg swelling.  Gastrointestinal: Negative for abdominal pain, diarrhea, nausea and vomiting.  Genitourinary: Negative for difficulty urinating and dysuria.  Musculoskeletal: Negative for joint swelling and myalgias.  Skin: Positive for rash. Negative for color change.  Neurological: Negative for dizziness, light-headedness and headaches.  Psychiatric/Behavioral: Negative for  agitation and dysphoric mood.       Objective:    Physical Exam Constitutional:      General: She is not in acute distress.    Appearance: Normal appearance.  HENT:     Nose: Nose normal. No congestion.     Mouth/Throat:     Pharynx: No oropharyngeal exudate or posterior oropharyngeal erythema.  Neck:     Musculoskeletal: Neck supple. No muscular tenderness.     Thyroid: No thyromegaly.  Cardiovascular:     Rate and Rhythm: Normal rate  and regular rhythm.  Pulmonary:     Effort: No respiratory distress.     Breath sounds: Normal breath sounds. No wheezing.  Abdominal:     General: Bowel sounds are normal.     Palpations: Abdomen is soft.     Tenderness: There is no abdominal tenderness.  Musculoskeletal:        General: No swelling or tenderness.  Lymphadenopathy:     Cervical: No cervical adenopathy.  Skin:    Findings: Rash present. No erythema.  Neurological:     Mental Status: She is alert.  Psychiatric:        Mood and Affect: Mood normal.        Behavior: Behavior normal.     BP 140/70 (BP Location: Left Arm, Patient Position: Sitting, Cuff Size: Normal)   Pulse 62   Temp 98.1 F (36.7 C) (Oral)   Resp 18   Wt 177 lb 3.2 oz (80.4 kg)   SpO2 98%   BMI 25.43 kg/m  Wt Readings from Last 3 Encounters:  09/24/18 177 lb 3.2 oz (80.4 kg)  08/20/18 176 lb 12.8 oz (80.2 kg)  07/29/18 174 lb 12.8 oz (79.3 kg)     Lab Results  Component Value Date   WBC 5.9 02/15/2018   HGB 13.4 02/15/2018   HCT 39.4 02/15/2018   PLT 176.0 02/15/2018   GLUCOSE 88 08/20/2018   CHOL 108 08/20/2018   TRIG 82.0 08/20/2018   HDL 36.40 (L) 08/20/2018   LDLCALC 55 08/20/2018   ALT 18 08/20/2018   AST 18 08/20/2018   NA 141 08/20/2018   K 3.9 08/20/2018   CL 104 08/20/2018   CREATININE 0.85 08/20/2018   BUN 17 08/20/2018   CO2 30 08/20/2018   TSH 3.45 02/15/2018    Mr Brain W Wo Contrast  Result Date: 09/24/2018 CLINICAL DATA:  Pulsatile tinnitus over the last 2  months, left worse than right. EXAM: MRI HEAD WITHOUT AND WITH CONTRAST TECHNIQUE: Multiplanar, multiecho pulse sequences of the brain and surrounding structures were obtained without and with intravenous contrast. CONTRAST:  7.5 cc Gadavist COMPARISON:  07/18/2016 FINDINGS: Brain: Diffusion imaging does not show any acute or subacute infarction or other cause of restricted diffusion. The brainstem and cerebellum are normal. CP angle regions are normal. No vestibular schwannoma or other mass lesion. No abnormal vascular structure identified. No enhancing neuritis. Within the cerebral hemispheres, there is a single focus of T2 and FLAIR signal within the deep white matter on each side. The remainder of the cerebral hemispheres are normal. As isolated findings, these are not likely significant. One could not rule out the earliest manifestation of small vessel change or the most minimal manifestation of inactive demyelinating disease. No evidence of mass, hemorrhage, hydrocephalus or extra-axial collection. No abnormal enhancement occurs elsewhere. Vascular: Major vessels at the base of the brain show flow. Skull and upper cervical spine: Negative Sinuses/Orbits: Clear/normal Other: None IMPRESSION: No abnormality seen to explain pulsatile all tentative a spirit no vestibular schwannoma or other regional abnormality. Normal appearance of the brain, with exception of 2 deep white matter foci, not likely to be significant as described above. Electronically Signed   By: Nelson Chimes M.D.   On: 09/24/2018 07:13       Assessment & Plan:   Problem List Items Addressed This Visit    CAD (coronary artery disease)    Followed by cardiology.  Stable.  Continue risk factor modification.        Essential  hypertension    Blood pressure slightly elevated today.  Hold on making changes in her medication.  Have her spot check her pressure.  Follow.        Relevant Orders   Basic metabolic panel   Hypercholesterolemia      On lipitor.  Low cholesterol diet and exercise.  Follow lipid panel and liver function tests.        Relevant Orders   Hepatic function panel   Lipid panel   Neuropathy    Had NCS - polyneuropathy.  On gabapentin.  Discussed increasing the dose of gabapentin.  On alpha lipoic acid.  Discussed other treatment options. Discussed lyrica.  Follow.            Einar Pheasant, MD

## 2018-09-25 NOTE — Telephone Encounter (Signed)
Called and left message for Dr Roosvelt Harps office to send over notes and info from biopsy

## 2018-09-25 NOTE — Telephone Encounter (Signed)
Please call Dr Roosvelt Harps office and let them know to forward his office note and any info from biopsy when it is received.

## 2018-09-26 NOTE — Telephone Encounter (Signed)
Records received

## 2018-09-27 NOTE — Telephone Encounter (Signed)
Order placed for neurology referral.   

## 2018-09-29 ENCOUNTER — Encounter: Payer: Self-pay | Admitting: Internal Medicine

## 2018-09-29 NOTE — Assessment & Plan Note (Signed)
Followed by cardiology.  Stable.  Continue risk factor modification.   

## 2018-09-29 NOTE — Assessment & Plan Note (Signed)
Had NCS - polyneuropathy.  On gabapentin.  Discussed increasing the dose of gabapentin.  On alpha lipoic acid.  Discussed other treatment options. Discussed lyrica.  Follow.

## 2018-09-29 NOTE — Assessment & Plan Note (Signed)
On lipitor.  Low cholesterol diet and exercise.  Follow lipid panel and liver function tests.   

## 2018-09-29 NOTE — Assessment & Plan Note (Signed)
Blood pressure slightly elevated today.  Hold on making changes in her medication.  Have her spot check her pressure.  Follow.

## 2018-10-02 ENCOUNTER — Encounter: Payer: Self-pay | Admitting: Internal Medicine

## 2018-10-03 ENCOUNTER — Ambulatory Visit (INDEPENDENT_AMBULATORY_CARE_PROVIDER_SITE_OTHER): Payer: PPO | Admitting: Internal Medicine

## 2018-10-03 ENCOUNTER — Encounter: Payer: Self-pay | Admitting: Internal Medicine

## 2018-10-03 ENCOUNTER — Other Ambulatory Visit: Payer: Self-pay | Admitting: Internal Medicine

## 2018-10-03 VITALS — BP 150/74 | HR 63 | Resp 18

## 2018-10-03 DIAGNOSIS — R300 Dysuria: Secondary | ICD-10-CM

## 2018-10-03 LAB — POCT URINALYSIS DIPSTICK
Bilirubin, UA: NEGATIVE
Glucose, UA: NEGATIVE
Ketones, UA: NEGATIVE
Nitrite, UA: NEGATIVE
Protein, UA: POSITIVE — AB
Spec Grav, UA: 1.015 (ref 1.010–1.025)
Urobilinogen, UA: NEGATIVE E.U./dL — AB
pH, UA: 6 (ref 5.0–8.0)

## 2018-10-03 LAB — URINALYSIS, MICROSCOPIC ONLY

## 2018-10-03 MED ORDER — PREGABALIN 25 MG PO CAPS: 25.0000 mg | ORAL_CAPSULE | Freq: Every day | ORAL | 1 refills | Status: DC

## 2018-10-03 MED ORDER — CEFUROXIME AXETIL 250 MG PO TABS: 250.0000 mg | ORAL_TABLET | Freq: Two times a day (BID) | ORAL | 0 refills | Status: DC

## 2018-10-03 NOTE — Progress Notes (Signed)
[name], female   DOB: November 12, 1950, 68 y.o.   MRN: [id number]   Subjective:    [name], female    DOB: April 14, 1951, 68 y.o.   MRN: [id number]  HPI  Patient here as a work in with concerns regarding a possible uti.  States she started having symptoms a few days ago.  Noticed some discomfort with end urination.  Some increased frequency.  Cloudy urine.  Some increased urgency. No fever.  No nausea or vomiting.  No diarrhea.  Eating and drinking.     Past Medical History:  Diagnosis Date  . Acid reflux   . Coronary artery disease   . 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 Heart murmur   . History of chicken pox   . Hyperlipidemia   . Hypertension   . Insomnia    Past Surgical History:  Procedure Laterality Date  . APPENDECTOMY    . BREAST EXCISIONAL BIOPSY Right    negative over 5 years ago- neg  . BREAST SURGERY     Biopsy  . COLONOSCOPY WITH PROPOFOL N/A 05/16/2016   Procedure: COLONOSCOPY WITH PROPOFOL;  Surgeon: [name], MD;  Location: [facility];  Service: Endoscopy;  Laterality: N/A;  . CORONARY ARTERY BYPASS GRAFT  05/01/2014   3 vessel, [facility]  . ESOPHAGOGASTRODUODENOSCOPY (EGD) WITH PROPOFOL N/A 02/23/2017   Procedure: ESOPHAGOGASTRODUODENOSCOPY (EGD) WITH PROPOFOL;  Surgeon: [name], MD;  Location: [place];  Service: Endoscopy;  Laterality: N/A;  . HYSTEROSCOPY W/D&C    . lipoma removed     removed from forehead  . triple bypass     Family History  Problem Relation Age of Onset  . Breast cancer Other        maternal great aunt  . Heart disease Other        multiple family members  . Heart disease Mother   . Stroke Mother   . Alzheimer's disease Mother   . Heart disease Father   . Stroke Father   . Breast cancer Sister 17  . Alzheimer's disease Maternal Aunt   . Alzheimer's disease Maternal Uncle   . Alzheimer's disease Maternal Grandmother   . Colon cancer Neg Hx   . Diabetes  Neg Hx   . Ovarian cancer Neg Hx    Social History   Socioeconomic History  . Marital status: Married    Spouse name: Not on file  . Number of children: Not on file  . Years of education: Not on file  . Highest education level: Not on file  Occupational History  . Not on file  Social Needs  . Financial resource strain: Not hard at all  . Food insecurity:    Worry: Never true    Inability: Never true  . Transportation needs:    Medical: No    Non-medical: No  Tobacco Use  . Smoking status: Never Smoker  . Smokeless tobacco: Never Used  Substance and Sexual Activity  . Alcohol use: Yes    Alcohol/week: 0.0 standard drinks    Comment: socially - 1x/3 wks  . Drug use: No  . Sexual activity: Yes    Birth control/protection: Post-menopausal  Lifestyle  . Physical activity:    Days per week: 3 days    Minutes per session: 50 min  . Stress: Not at all  Relationships  . Social connections:    Talks on phone: Not on file  Gets together: Not on file    Attends religious service: Not on file    Active member of club or organization: Not on file    Attends meetings of clubs or organizations: Not on file    Relationship status: Not on file  Other Topics Concern  . Not on file  Social History Narrative  . Not on file    Outpatient Encounter Medications as of 10/03/2018  Medication Sig  . Alpha-Lipoic Acid 100 MG CAPS Take by mouth.  Marland Kitchen amLODipine (NORVASC) 10 MG tablet Take 10 mg by mouth daily.  Marland Kitchen aspirin EC 81 MG tablet Take 81 mg by mouth daily.  Marland Kitchen atorvastatin (LIPITOR) 20 MG tablet TAKE 1 TABLET (20 MG TOTAL) BY MOUTH ONCE DAILY.  . cefUROXime (CEFTIN) 250 MG tablet Take 1 tablet (250 mg total) by mouth 2 (two) times daily with a meal.  . Docusate Calcium (STOOL SOFTENER PO) Take by mouth.  . hydrochlorothiazide (HYDRODIURIL) 25 MG tablet TAKE 1 TABLET (25 MG TOTAL) BY MOUTH ONCE DAILY.  Marland Kitchen loratadine (CLARITIN) 10 MG tablet Take 1 tablet (10 mg total) by mouth daily.    . metoprolol tartrate (LOPRESSOR) 25 MG tablet Take 25 mg by mouth 2 (two) times daily.  . Omega-3 Fatty Acids (FISH OIL) 1000 MG CAPS Take 1,200 mg by mouth daily.  . pantoprazole (PROTONIX) 40 MG tablet TAKE 1 TABLET (40 MG TOTAL) BY MOUTH DAILY. TAKE 30 MINUTES BEFORE BREAKFAST  . pregabalin (LYRICA) 25 MG capsule Take 1 capsule (25 mg total) by mouth at bedtime.  . traZODone (DESYREL) 50 MG tablet TAKE 1-2 TABLETS (50-100 MG TOTAL) BY MOUTH AT BEDTIME AS NEEDED.  Marland Kitchen triamcinolone cream (KENALOG) 0.1 % Apply 1 application topically 2 (two) times daily. Use for 7 days.  . valsartan (DIOVAN) 160 MG tablet Take by mouth.  Marland Kitchen VITAMIN D, ERGOCALCIFEROL, PO Take by mouth.   No facility-administered encounter medications on file as of 10/03/2018.     Review of Systems  Constitutional: Negative for appetite change and fever.  Gastrointestinal: Negative for abdominal pain, diarrhea, nausea and vomiting.  Genitourinary: Positive for dysuria and urgency. Negative for vaginal discharge and vaginal pain.  Musculoskeletal: Negative for back pain and myalgias.  Skin: Negative for color change and rash.       Objective:    Physical Exam Constitutional:      General: She is not in acute distress.    Appearance: Normal appearance.  Neck:     Thyroid: No thyromegaly.  Cardiovascular:     Rate and Rhythm: Normal rate and regular rhythm.  Pulmonary:     Effort: No respiratory distress.     Breath sounds: Normal breath sounds. No wheezing.  Abdominal:     General: Bowel sounds are normal.     Palpations: Abdomen is soft.     Tenderness: There is no abdominal tenderness.  Musculoskeletal:     Comments: No back tenderness or CVA tenderness.    Skin:    Findings: No erythema or rash.  Neurological:     Mental Status: She is alert.  Psychiatric:        Mood and Affect: Mood normal.        Behavior: Behavior normal.     BP (!) 150/74   Pulse 63   Resp 18   SpO2 98%  Wt Readings from Last  3 Encounters:  09/24/18 177 lb 3.2 oz (80.4 kg)  08/20/18 176 lb 12.8 oz (80.2 kg)  07/29/18 174  lb 12.8 oz (79.3 kg)     Lab Results  Component Value Date   WBC 5.9 02/15/2018   HGB 13.4 02/15/2018   HCT 39.4 02/15/2018   PLT 176.0 02/15/2018   GLUCOSE 88 08/20/2018   CHOL 108 08/20/2018   TRIG 82.0 08/20/2018   HDL 36.40 (L) 08/20/2018   LDLCALC 55 08/20/2018   ALT 18 08/20/2018   AST 18 08/20/2018   NA 141 08/20/2018   K 3.9 08/20/2018   CL 104 08/20/2018   CREATININE 0.85 08/20/2018   BUN 17 08/20/2018   CO2 30 08/20/2018   TSH 3.45 02/15/2018    Mr Brain W Wo Contrast  Addendum Date: 10/03/2018   ADDENDUM REPORT: 10/03/2018 07:28 ADDENDUM: Speech recognition error. Impression should read: No abnormality seen to explain pulsatile tinnitus. No vestibular schwannoma or other regional abnormality. Electronically Signed   By: Nelson Chimes M.D.   On: 10/03/2018 07:28   Result Date: 10/03/2018 CLINICAL DATA:  Pulsatile tinnitus over the last 2 months, left worse than right. EXAM: MRI HEAD WITHOUT AND WITH CONTRAST TECHNIQUE: Multiplanar, multiecho pulse sequences of the brain and surrounding structures were obtained without and with intravenous contrast. CONTRAST:  7.5 cc Gadavist COMPARISON:  07/18/2016 FINDINGS: Brain: Diffusion imaging does not show any acute or subacute infarction or other cause of restricted diffusion. The brainstem and cerebellum are normal. CP angle regions are normal. No vestibular schwannoma or other mass lesion. No abnormal vascular structure identified. No enhancing neuritis. Within the cerebral hemispheres, there is a single focus of T2 and FLAIR signal within the deep white matter on each side. The remainder of the cerebral hemispheres are normal. As isolated findings, these are not likely significant. One could not rule out the earliest manifestation of small vessel change or the most minimal manifestation of inactive demyelinating disease. No evidence of  mass, hemorrhage, hydrocephalus or extra-axial collection. No abnormal enhancement occurs elsewhere. Vascular: Major vessels at the base of the brain show flow. Skull and upper cervical spine: Negative Sinuses/Orbits: Clear/normal Other: None IMPRESSION: No abnormality seen to explain pulsatile all tentative a spirit no vestibular schwannoma or other regional abnormality. Normal appearance of the brain, with exception of 2 deep white matter foci, not likely to be significant as described above. Electronically Signed: By: Nelson Chimes M.D. On: 09/24/2018 07:13       Assessment & Plan:   Problem List Items Addressed This Visit    None    Visit Diagnoses    Dysuria    -  Primary   Symptoms and urine dip c/w UTI.  Treat with ceftin as directed.  Urine culture sent.     Relevant Orders   POCT urinalysis dipstick (Completed)   Urine Culture (Completed)   Urine Microscopic (Completed)       Einar Pheasant, MD

## 2018-10-03 NOTE — Patient Instructions (Signed)
Take a probiotic daily while you are on the antibiotic and for two weeks after completing the antibiotic   

## 2018-10-03 NOTE — Telephone Encounter (Signed)
rx sent in for lyrica 25mg  #30 with one refill.

## 2018-10-05 LAB — URINE CULTURE
MICRO NUMBER:: 95170
SPECIMEN QUALITY: ADEQUATE

## 2018-10-06 ENCOUNTER — Encounter: Payer: Self-pay | Admitting: Internal Medicine

## 2018-10-07 ENCOUNTER — Other Ambulatory Visit: Payer: Self-pay | Admitting: Internal Medicine

## 2018-10-07 DIAGNOSIS — G608 Other hereditary and idiopathic neuropathies: Secondary | ICD-10-CM | POA: Diagnosis not present

## 2018-10-07 DIAGNOSIS — M79671 Pain in right foot: Secondary | ICD-10-CM | POA: Diagnosis not present

## 2018-10-07 DIAGNOSIS — R319 Hematuria, unspecified: Secondary | ICD-10-CM

## 2018-10-07 DIAGNOSIS — M79604 Pain in right leg: Secondary | ICD-10-CM | POA: Diagnosis not present

## 2018-10-07 DIAGNOSIS — M79672 Pain in left foot: Secondary | ICD-10-CM | POA: Diagnosis not present

## 2018-10-07 DIAGNOSIS — G2581 Restless legs syndrome: Secondary | ICD-10-CM | POA: Diagnosis not present

## 2018-10-07 DIAGNOSIS — M79605 Pain in left leg: Secondary | ICD-10-CM | POA: Diagnosis not present

## 2018-10-07 DIAGNOSIS — R208 Other disturbances of skin sensation: Secondary | ICD-10-CM | POA: Diagnosis not present

## 2018-10-07 NOTE — Progress Notes (Signed)
Order placed for f/u urinalysis and culture.  

## 2018-10-08 ENCOUNTER — Other Ambulatory Visit: Payer: Self-pay | Admitting: Family Medicine

## 2018-10-08 DIAGNOSIS — R21 Rash and other nonspecific skin eruption: Secondary | ICD-10-CM

## 2018-10-10 ENCOUNTER — Encounter: Payer: Self-pay | Admitting: Internal Medicine

## 2018-10-10 ENCOUNTER — Other Ambulatory Visit (INDEPENDENT_AMBULATORY_CARE_PROVIDER_SITE_OTHER): Payer: PPO

## 2018-10-10 DIAGNOSIS — R319 Hematuria, unspecified: Secondary | ICD-10-CM | POA: Diagnosis not present

## 2018-10-10 LAB — URINALYSIS, ROUTINE W REFLEX MICROSCOPIC
Bilirubin Urine: NEGATIVE
Hgb urine dipstick: NEGATIVE
Leukocytes, UA: NEGATIVE
Nitrite: NEGATIVE
RBC / HPF: NONE SEEN (ref 0–?)
SPECIFIC GRAVITY, URINE: 1.02 (ref 1.000–1.030)
Total Protein, Urine: NEGATIVE
UROBILINOGEN UA: 0.2 (ref 0.0–1.0)
Urine Glucose: NEGATIVE
pH: 6 (ref 5.0–8.0)

## 2018-10-11 ENCOUNTER — Encounter: Payer: Self-pay | Admitting: Internal Medicine

## 2018-10-11 LAB — URINE CULTURE
MICRO NUMBER: 127988
SPECIMEN QUALITY:: ADEQUATE

## 2018-10-14 ENCOUNTER — Encounter: Payer: Self-pay | Admitting: Internal Medicine

## 2018-10-15 MED ORDER — OSELTAMIVIR PHOSPHATE 75 MG PO CAPS: 75.0000 mg | ORAL_CAPSULE | Freq: Every day | ORAL | 0 refills | Status: DC

## 2018-10-15 NOTE — Telephone Encounter (Signed)
Spoke to pt.  She is taking care of her granddaughter.  Granddaughter diagnosed with flu.  Discussed prophylaxis with tamiflu.  rx sent in for tamiflu to take one per day.  If symptoms develop, will start treatment dose.  Call with problems.  Pt aware rx sent in.

## 2018-10-16 DIAGNOSIS — L270 Generalized skin eruption due to drugs and medicaments taken internally: Secondary | ICD-10-CM | POA: Diagnosis not present

## 2018-11-05 ENCOUNTER — Other Ambulatory Visit: Payer: Self-pay | Admitting: Family Medicine

## 2018-11-05 DIAGNOSIS — R21 Rash and other nonspecific skin eruption: Secondary | ICD-10-CM

## 2018-11-05 NOTE — Telephone Encounter (Signed)
She has seen dermatology for the rash.  This was given at an acute visit for rash.  I do not mind refilling the medication, but dose she still need to loratidine?  If so, ok to refill #30 with no refill.

## 2018-11-05 NOTE — Telephone Encounter (Signed)
Mychart sent.

## 2018-11-15 DIAGNOSIS — I25810 Atherosclerosis of coronary artery bypass graft(s) without angina pectoris: Secondary | ICD-10-CM | POA: Diagnosis not present

## 2018-11-15 DIAGNOSIS — I10 Essential (primary) hypertension: Secondary | ICD-10-CM | POA: Diagnosis not present

## 2018-11-15 DIAGNOSIS — E782 Mixed hyperlipidemia: Secondary | ICD-10-CM | POA: Diagnosis not present

## 2018-11-19 DIAGNOSIS — G44099 Other trigeminal autonomic cephalgias (TAC), not intractable: Secondary | ICD-10-CM | POA: Diagnosis not present

## 2018-11-19 DIAGNOSIS — G608 Other hereditary and idiopathic neuropathies: Secondary | ICD-10-CM | POA: Diagnosis not present

## 2018-11-19 DIAGNOSIS — M4807 Spinal stenosis, lumbosacral region: Secondary | ICD-10-CM | POA: Diagnosis not present

## 2018-11-19 DIAGNOSIS — G2581 Restless legs syndrome: Secondary | ICD-10-CM | POA: Diagnosis not present

## 2018-11-19 DIAGNOSIS — G939 Disorder of brain, unspecified: Secondary | ICD-10-CM | POA: Diagnosis not present

## 2018-11-20 ENCOUNTER — Other Ambulatory Visit: Payer: Self-pay

## 2018-11-20 ENCOUNTER — Ambulatory Visit (INDEPENDENT_AMBULATORY_CARE_PROVIDER_SITE_OTHER): Payer: PPO

## 2018-11-20 DIAGNOSIS — E538 Deficiency of other specified B group vitamins: Secondary | ICD-10-CM

## 2018-11-20 MED ORDER — CYANOCOBALAMIN 1000 MCG/ML IJ SOLN
1000.0000 ug | Freq: Once | INTRAMUSCULAR | Status: AC
  Administered 2018-11-20: 1000 ug via INTRAMUSCULAR

## 2018-11-20 NOTE — Progress Notes (Signed)
Patient presents today for B12 injection. Right deltoid. Patient voiced no concerns nor showed any signs of distress during injection.

## 2018-11-24 NOTE — Progress Notes (Signed)
  I have reviewed the above information and agree with above.   Sabrinia Prien, MD 

## 2018-12-03 ENCOUNTER — Encounter: Payer: Self-pay | Admitting: Internal Medicine

## 2018-12-03 ENCOUNTER — Other Ambulatory Visit (INDEPENDENT_AMBULATORY_CARE_PROVIDER_SITE_OTHER): Payer: PPO

## 2018-12-03 ENCOUNTER — Other Ambulatory Visit: Payer: Self-pay

## 2018-12-03 DIAGNOSIS — E7800 Pure hypercholesterolemia, unspecified: Secondary | ICD-10-CM | POA: Diagnosis not present

## 2018-12-03 DIAGNOSIS — I10 Essential (primary) hypertension: Secondary | ICD-10-CM | POA: Diagnosis not present

## 2018-12-03 LAB — HEPATIC FUNCTION PANEL
ALT: 21 U/L (ref 0–35)
AST: 18 U/L (ref 0–37)
Albumin: 4.5 g/dL (ref 3.5–5.2)
Alkaline Phosphatase: 40 U/L (ref 39–117)
Bilirubin, Direct: 0.1 mg/dL (ref 0.0–0.3)
Total Bilirubin: 0.6 mg/dL (ref 0.2–1.2)
Total Protein: 6.7 g/dL (ref 6.0–8.3)

## 2018-12-03 LAB — BASIC METABOLIC PANEL
BUN: 13 mg/dL (ref 6–23)
CO2: 32 mEq/L (ref 19–32)
Calcium: 10.6 mg/dL — ABNORMAL HIGH (ref 8.4–10.5)
Chloride: 103 mEq/L (ref 96–112)
Creatinine, Ser: 0.83 mg/dL (ref 0.40–1.20)
GFR: 68.46 mL/min (ref >60.00)
Glucose, Bld: 86 mg/dL (ref 70–99)
Potassium: 3.7 mEq/L (ref 3.5–5.1)
Sodium: 141 mEq/L (ref 135–145)

## 2018-12-03 LAB — LIPID PANEL
Cholesterol: 121 mg/dL (ref 0–200)
HDL: 39.7 mg/dL (ref >39.00)
LDL Cholesterol: 63 mg/dL (ref 0–99)
NonHDL: 81.15
Total CHOL/HDL Ratio: 3
Triglycerides: 93 mg/dL (ref 0.0–149.0)
VLDL: 18.6 mg/dL (ref 0.0–40.0)

## 2018-12-05 ENCOUNTER — Ambulatory Visit (INDEPENDENT_AMBULATORY_CARE_PROVIDER_SITE_OTHER): Payer: PPO | Admitting: Internal Medicine

## 2018-12-05 ENCOUNTER — Other Ambulatory Visit: Payer: Self-pay

## 2018-12-05 DIAGNOSIS — E7800 Pure hypercholesterolemia, unspecified: Secondary | ICD-10-CM | POA: Diagnosis not present

## 2018-12-05 DIAGNOSIS — G44209 Tension-type headache, unspecified, not intractable: Secondary | ICD-10-CM | POA: Diagnosis not present

## 2018-12-05 DIAGNOSIS — G629 Polyneuropathy, unspecified: Secondary | ICD-10-CM | POA: Diagnosis not present

## 2018-12-05 DIAGNOSIS — I10 Essential (primary) hypertension: Secondary | ICD-10-CM

## 2018-12-05 DIAGNOSIS — I2510 Atherosclerotic heart disease of native coronary artery without angina pectoris: Secondary | ICD-10-CM

## 2018-12-05 NOTE — Progress Notes (Signed)
[name], female   DOB: 1951/02/17, 68 y.o.   MRN: [id number]   Subjective:    [name], female    DOB: 07/26/1951, 68 y.o.   MRN: [id number]  HPI  Patient here for a scheduled follow up.  She has seen neurology.  Had her lyrica increased to 50mg .  Still taking alpha lipoic acid.  Tolerating lyrica.  May have helped some.  Plans to stop alpha lipoic acid.  Does not feel has helped.  Did not start medication for restless legs.  Desires not to.  Discussed possibility of increasing lyrica.  Trying to stay active.  No chest pain.  No sob.  No acid reflux.  No abdominal pain.  Bowels moving.  Now taking irbesartan.  Tolerating.  Headaches better.  Concerned telmisartan contributed to headache.  Rash better.  Saw dermatology.  States blood pressure doing well.  Blood pressures averaging 115-130s/60-70s.  Discussed labs.  Discussed slight elevation of calcium.     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)   Heart murmur    History of chicken pox    Hyperlipidemia    Hypertension    Insomnia    Past Surgical History:  Procedure Laterality Date   APPENDECTOMY     BREAST EXCISIONAL BIOPSY Right    negative over 5 years ago- neg   BREAST SURGERY     Biopsy   COLONOSCOPY WITH PROPOFOL N/A 05/16/2016   Procedure: COLONOSCOPY WITH PROPOFOL;  Surgeon: [name], MD;  Location: [facility];  Service: Endoscopy;  Laterality: N/A;   CORONARY ARTERY BYPASS GRAFT  05/01/2014   3 vessel, [facility]   ESOPHAGOGASTRODUODENOSCOPY (EGD) WITH PROPOFOL N/A 02/23/2017   Procedure: ESOPHAGOGASTRODUODENOSCOPY (EGD) WITH PROPOFOL;  Surgeon: [name], MD;  Location: [facility];  Service: Endoscopy;  Laterality: N/A;   HYSTEROSCOPY W/D&C     lipoma removed     removed from forehead   triple bypass     Family History  Problem Relation Age of Onset     Breast cancer Other        maternal great aunt   Heart disease Other        multiple family members   Heart disease Mother    Stroke Mother    Alzheimer's disease Mother    Heart disease Father    Stroke Father    Breast cancer Sister 68   Alzheimer's disease Maternal Aunt    Alzheimer's disease Maternal Uncle    Alzheimer's disease Maternal Grandmother    Colon cancer Neg Hx    Diabetes Neg Hx    Ovarian cancer Neg Hx    Social History   Socioeconomic History   Marital status: Married    Spouse name: Not on file   Number of children: Not on file   Years of education: Not on file   Highest education level: Not on file  Occupational History   Not on file  Social Needs   Financial resource strain: Not hard at all   Food insecurity:    Worry: Never true    Inability: Never true   Transportation needs:    Medical: No    Non-medical: No  Tobacco Use   Smoking status: Never Smoker   Smokeless tobacco: Never Used  Substance and Sexual Activity   Alcohol use: Yes  Alcohol/week: 0.0 standard drinks    Comment: socially - 1x/3 wks   Drug use: No   Sexual activity: Yes    Birth control/protection: Post-menopausal  Lifestyle   Physical activity:    Days per week: 3 days    Minutes per session: 50 min   Stress: Not at all  Relationships   Social connections:    Talks on phone: Not on file    Gets together: Not on file    Attends religious service: Not on file    Active member of club or organization: Not on file    Attends meetings of clubs or organizations: Not on file    Relationship status: Not on file  Other Topics Concern   Not on file  Social History Narrative   Not on file    Outpatient Encounter Medications as of 12/05/2018  Medication Sig   Alpha-Lipoic Acid 100 MG CAPS Take by mouth.   amLODipine (NORVASC) 10 MG tablet Take 10 mg by mouth daily.   aspirin EC 81 MG tablet Take 81 mg by mouth daily.   atorvastatin  (LIPITOR) 20 MG tablet TAKE 1 TABLET (20 MG TOTAL) BY MOUTH ONCE DAILY.   Docusate Calcium (STOOL SOFTENER PO) Take by mouth.   hydrochlorothiazide (HYDRODIURIL) 25 MG tablet TAKE 1 TABLET (25 MG TOTAL) BY MOUTH ONCE DAILY.   irbesartan (AVAPRO) 150 MG tablet Take 150 mg by mouth daily.   loratadine (CLARITIN) 10 MG tablet TAKE 1 TABLET BY MOUTH EVERY DAY   metoprolol tartrate (LOPRESSOR) 25 MG tablet Take 25 mg by mouth 2 (two) times daily.   Omega-3 Fatty Acids (FISH OIL) 1000 MG CAPS Take 1,200 mg by mouth daily.   pantoprazole (PROTONIX) 40 MG tablet TAKE 1 TABLET (40 MG TOTAL) BY MOUTH DAILY. TAKE 30 MINUTES BEFORE BREAKFAST   pregabalin (LYRICA) 50 MG capsule Take 50 mg by mouth daily.   traZODone (DESYREL) 50 MG tablet TAKE 1-2 TABLETS (50-100 MG TOTAL) BY MOUTH AT BEDTIME AS NEEDED.   triamcinolone cream (KENALOG) 0.1 % Apply 1 application topically 2 (two) times daily. Use for 7 days.   VITAMIN D, ERGOCALCIFEROL, PO Take by mouth.   [DISCONTINUED] cefUROXime (CEFTIN) 250 MG tablet Take 1 tablet (250 mg total) by mouth 2 (two) times daily with a meal.   [DISCONTINUED] oseltamivir (TAMIFLU) 75 MG capsule Take 1 capsule (75 mg total) by mouth daily.   [DISCONTINUED] pregabalin (LYRICA) 25 MG capsule Take 1 capsule (25 mg total) by mouth at bedtime.   [DISCONTINUED] valsartan (DIOVAN) 160 MG tablet Take by mouth.   No facility-administered encounter medications on file as of 12/05/2018.     Review of Systems  Constitutional: Negative for appetite change and unexpected weight change.  HENT: Negative for congestion and sinus pressure.   Respiratory: Negative for cough, chest tightness and shortness of breath.   Cardiovascular: Negative for chest pain, palpitations and leg swelling.  Gastrointestinal: Negative for abdominal pain, diarrhea, nausea and vomiting.  Genitourinary: Negative for difficulty urinating and dysuria.  Musculoskeletal: Negative for joint swelling and  myalgias.       Leg discomfort as outlined.    Skin: Negative for color change and rash.  Neurological: Negative for dizziness, light-headedness and headaches.  Psychiatric/Behavioral: Negative for agitation and dysphoric mood.       Objective:    Physical Exam Constitutional:      General: She is not in acute distress.    Appearance: Normal appearance.  HENT:  Nose: Nose normal. No congestion.     Mouth/Throat:     Pharynx: No oropharyngeal exudate or posterior oropharyngeal erythema.  Neck:     Musculoskeletal: Neck supple. No muscular tenderness.     Thyroid: No thyromegaly.  Cardiovascular:     Rate and Rhythm: Normal rate and regular rhythm.  Pulmonary:     Effort: No respiratory distress.     Breath sounds: Normal breath sounds. No wheezing.  Abdominal:     General: Bowel sounds are normal.     Palpations: Abdomen is soft.     Tenderness: There is no abdominal tenderness.  Musculoskeletal:        General: No swelling or tenderness.  Lymphadenopathy:     Cervical: No cervical adenopathy.  Skin:    Findings: No erythema or rash.  Neurological:     Mental Status: She is alert.  Psychiatric:        Mood and Affect: Mood normal.        Behavior: Behavior normal.     BP 130/76    Pulse 62    Temp 98.1 F (36.7 C) (Oral)    Resp 16    Wt 173 lb (78.5 kg)    SpO2 98%    BMI 24.82 kg/m  Wt Readings from Last 3 Encounters:  12/05/18 173 lb (78.5 kg)  09/24/18 177 lb 3.2 oz (80.4 kg)  08/20/18 176 lb 12.8 oz (80.2 kg)     Lab Results  Component Value Date   WBC 5.9 02/15/2018   HGB 13.4 02/15/2018   HCT 39.4 02/15/2018   PLT 176.0 02/15/2018   GLUCOSE 86 12/03/2018   CHOL 121 12/03/2018   TRIG 93.0 12/03/2018   HDL 39.70 12/03/2018   LDLCALC 63 12/03/2018   ALT 21 12/03/2018   AST 18 12/03/2018   NA 141 12/03/2018   K 3.7 12/03/2018   CL 103 12/03/2018   CREATININE 0.83 12/03/2018   BUN 13 12/03/2018   CO2 32 12/03/2018   TSH 3.45 02/15/2018     Mr Brain W Wo Contrast  Addendum Date: 10/03/2018   ADDENDUM REPORT: 10/03/2018 07:28 ADDENDUM: Speech recognition error. Impression should read: No abnormality seen to explain pulsatile tinnitus. No vestibular schwannoma or other regional abnormality. Electronically Signed   By: Nelson Chimes M.D.   On: 10/03/2018 07:28   Result Date: 10/03/2018 CLINICAL DATA:  Pulsatile tinnitus over the last 2 months, left worse than right. EXAM: MRI HEAD WITHOUT AND WITH CONTRAST TECHNIQUE: Multiplanar, multiecho pulse sequences of the brain and surrounding structures were obtained without and with intravenous contrast. CONTRAST:  7.5 cc Gadavist COMPARISON:  07/18/2016 FINDINGS: Brain: Diffusion imaging does not show any acute or subacute infarction or other cause of restricted diffusion. The brainstem and cerebellum are normal. CP angle regions are normal. No vestibular schwannoma or other mass lesion. No abnormal vascular structure identified. No enhancing neuritis. Within the cerebral hemispheres, there is a single focus of T2 and FLAIR signal within the deep white matter on each side. The remainder of the cerebral hemispheres are normal. As isolated findings, these are not likely significant. One could not rule out the earliest manifestation of small vessel change or the most minimal manifestation of inactive demyelinating disease. No evidence of mass, hemorrhage, hydrocephalus or extra-axial collection. No abnormal enhancement occurs elsewhere. Vascular: Major vessels at the base of the brain show flow. Skull and upper cervical spine: Negative Sinuses/Orbits: Clear/normal Other: None IMPRESSION: No abnormality seen to explain pulsatile all tentative  a spirit no vestibular schwannoma or other regional abnormality. Normal appearance of the brain, with exception of 2 deep white matter foci, not likely to be significant as described above. Electronically Signed: By: Nelson Chimes M.D. On: 09/24/2018 07:13        Assessment & Plan:   Problem List Items Addressed This Visit    CAD (coronary artery disease)    Followed by cardiology.  Stable.  Continue risk factor modification.        Relevant Medications   irbesartan (AVAPRO) 150 MG tablet   Essential hypertension    Blood pressure under good control.  Continue same medication regimen.  Follow pressures.  Follow metabolic panel.        Relevant Medications   irbesartan (AVAPRO) 150 MG tablet   Other Relevant Orders   CBC with Differential/Platelet   TSH   Basic metabolic panel   Headache    Saw neurology.  Improved.  Follow.       Relevant Medications   pregabalin (LYRICA) 50 MG capsule   Hypercholesterolemia    On lipitor.  Low cholesterol diet and exercise.  Follow lipid panel and liver function tests.        Relevant Medications   irbesartan (AVAPRO) 150 MG tablet   Other Relevant Orders   Hepatic function panel   Lipid panel   Neuropathy    Had NCS - polyneuropathy.  Saw neurology.  On lyrica.  Discussed increasing dose.  D/w neurology.            Einar Pheasant, MD

## 2018-12-07 ENCOUNTER — Encounter: Payer: Self-pay | Admitting: Internal Medicine

## 2018-12-07 NOTE — Assessment & Plan Note (Signed)
Had NCS - polyneuropathy.  Saw neurology.  On lyrica.  Discussed increasing dose.  D/w neurology.

## 2018-12-07 NOTE — Assessment & Plan Note (Signed)
Saw neurology.  Improved.  Follow.

## 2018-12-07 NOTE — Assessment & Plan Note (Signed)
Followed by cardiology.  Stable.  Continue risk factor modification.   

## 2018-12-07 NOTE — Assessment & Plan Note (Signed)
On lipitor.  Low cholesterol diet and exercise.  Follow lipid panel and liver function tests.   

## 2018-12-07 NOTE — Assessment & Plan Note (Signed)
Blood pressure under good control.  Continue same medication regimen.  Follow pressures.  Follow metabolic panel.   

## 2018-12-19 ENCOUNTER — Other Ambulatory Visit: Payer: Self-pay

## 2018-12-19 ENCOUNTER — Telehealth: Payer: Self-pay

## 2018-12-19 MED ORDER — "SYRINGE 25G X 1"" 3 ML MISC": 0 refills | Status: DC

## 2018-12-19 MED ORDER — CYANOCOBALAMIN 1000 MCG/ML IJ SOLN: 1000.0000 ug | INTRAMUSCULAR | 0 refills | Status: DC

## 2018-12-19 NOTE — Telephone Encounter (Signed)
Ok.  If she knows how to give the B12 - ok to send in vial and syringes.

## 2018-12-19 NOTE — Telephone Encounter (Signed)
Rx sent in. Confirmed she knows how to give properly.

## 2018-12-19 NOTE — Progress Notes (Unsigned)
cyano

## 2018-12-19 NOTE — Telephone Encounter (Signed)
Copied from [place]. Topic: General - Other >> Dec 19, 2018 11:40 AM Nils Flack wrote: Reason for CRM: pt has b 12 inj appt.  Pt is asking for Dr Arva Chafe to call in vial of b12 and syringe so she can do the shot herself instead of coming into the office.  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 Cb is [phone]

## 2018-12-19 NOTE — Telephone Encounter (Signed)
Are you okay with doing this?  

## 2018-12-24 ENCOUNTER — Ambulatory Visit: Payer: PPO

## 2019-01-06 ENCOUNTER — Encounter: Payer: Self-pay | Admitting: Internal Medicine

## 2019-01-09 MED ORDER — PREGABALIN 50 MG PO CAPS: 50.0000 mg | ORAL_CAPSULE | Freq: Two times a day (BID) | ORAL | 1 refills | Status: DC

## 2019-01-09 NOTE — Telephone Encounter (Signed)
rx sent in for lyrica bid #60 with one refill.

## 2019-01-28 ENCOUNTER — Ambulatory Visit: Payer: PPO

## 2019-02-07 DIAGNOSIS — E782 Mixed hyperlipidemia: Secondary | ICD-10-CM | POA: Diagnosis not present

## 2019-02-07 DIAGNOSIS — I10 Essential (primary) hypertension: Secondary | ICD-10-CM | POA: Diagnosis not present

## 2019-02-07 DIAGNOSIS — I6523 Occlusion and stenosis of bilateral carotid arteries: Secondary | ICD-10-CM | POA: Diagnosis not present

## 2019-02-07 DIAGNOSIS — I25810 Atherosclerosis of coronary artery bypass graft(s) without angina pectoris: Secondary | ICD-10-CM | POA: Diagnosis not present

## 2019-03-04 ENCOUNTER — Encounter: Payer: Self-pay | Admitting: Internal Medicine

## 2019-03-05 MED ORDER — PREGABALIN 75 MG PO CAPS: 75.0000 mg | ORAL_CAPSULE | Freq: Two times a day (BID) | ORAL | 1 refills | Status: DC

## 2019-03-05 NOTE — Telephone Encounter (Signed)
rx sent in for lyrica 75mg  bid #60 with one refill.  See my chart message.

## 2019-03-06 ENCOUNTER — Other Ambulatory Visit (INDEPENDENT_AMBULATORY_CARE_PROVIDER_SITE_OTHER): Payer: PPO

## 2019-03-06 ENCOUNTER — Other Ambulatory Visit: Payer: Self-pay

## 2019-03-06 ENCOUNTER — Encounter: Payer: Self-pay | Admitting: Internal Medicine

## 2019-03-06 DIAGNOSIS — E7800 Pure hypercholesterolemia, unspecified: Secondary | ICD-10-CM | POA: Diagnosis not present

## 2019-03-06 DIAGNOSIS — I10 Essential (primary) hypertension: Secondary | ICD-10-CM | POA: Diagnosis not present

## 2019-03-06 LAB — LIPID PANEL
Cholesterol: 114 mg/dL (ref 0–200)
HDL: 38.3 mg/dL — ABNORMAL LOW (ref >39.00)
LDL Cholesterol: 58 mg/dL (ref 0–99)
NonHDL: 75.51
Total CHOL/HDL Ratio: 3
Triglycerides: 87 mg/dL (ref 0.0–149.0)
VLDL: 17.4 mg/dL (ref 0.0–40.0)

## 2019-03-06 LAB — CBC WITH DIFFERENTIAL/PLATELET
Basophils Absolute: 0.1 10*3/uL (ref 0.0–0.1)
Basophils Relative: 1.5 % (ref 0.0–3.0)
Eosinophils Absolute: 0.2 10*3/uL (ref 0.0–0.7)
Eosinophils Relative: 4 % (ref 0.0–5.0)
HCT: 40.4 % (ref 36.0–46.0)
Hemoglobin: 13.7 g/dL (ref 12.0–15.0)
Lymphocytes Relative: 38.4 % (ref 12.0–46.0)
Lymphs Abs: 1.9 10*3/uL (ref 0.7–4.0)
MCHC: 33.8 g/dL (ref 30.0–36.0)
MCV: 92 fl (ref 78.0–100.0)
Monocytes Absolute: 0.5 10*3/uL (ref 0.1–1.0)
Monocytes Relative: 10.4 % (ref 3.0–12.0)
Neutro Abs: 2.3 10*3/uL (ref 1.4–7.7)
Neutrophils Relative %: 45.7 % (ref 43.0–77.0)
Platelets: 156 10*3/uL (ref 150.0–400.0)
RBC: 4.39 Mil/uL (ref 3.87–5.11)
RDW: 12.7 % (ref 11.5–15.5)
WBC: 5 10*3/uL (ref 4.0–10.5)

## 2019-03-06 LAB — BASIC METABOLIC PANEL
BUN: 15 mg/dL (ref 6–23)
CO2: 30 mEq/L (ref 19–32)
Calcium: 10 mg/dL (ref 8.4–10.5)
Chloride: 103 mEq/L (ref 96–112)
Creatinine, Ser: 0.81 mg/dL (ref 0.40–1.20)
GFR: 70.36 mL/min (ref >60.00)
Glucose, Bld: 81 mg/dL (ref 70–99)
Potassium: 3.6 mEq/L (ref 3.5–5.1)
Sodium: 140 mEq/L (ref 135–145)

## 2019-03-06 LAB — HEPATIC FUNCTION PANEL
ALT: 22 U/L (ref 0–35)
AST: 21 U/L (ref 0–37)
Albumin: 4.4 g/dL (ref 3.5–5.2)
Alkaline Phosphatase: 35 U/L — ABNORMAL LOW (ref 39–117)
Bilirubin, Direct: 0.1 mg/dL (ref 0.0–0.3)
Total Bilirubin: 0.6 mg/dL (ref 0.2–1.2)
Total Protein: 6.5 g/dL (ref 6.0–8.3)

## 2019-03-06 LAB — TSH: TSH: 4.01 u[IU]/mL (ref 0.35–4.50)

## 2019-03-10 ENCOUNTER — Encounter: Payer: Self-pay | Admitting: Internal Medicine

## 2019-03-10 ENCOUNTER — Other Ambulatory Visit: Payer: Self-pay

## 2019-03-10 ENCOUNTER — Ambulatory Visit (INDEPENDENT_AMBULATORY_CARE_PROVIDER_SITE_OTHER): Payer: PPO | Admitting: Internal Medicine

## 2019-03-10 VITALS — BP 138/84 | HR 63 | Temp 97.4°F | Ht 70.0 in | Wt 174.6 lb

## 2019-03-10 DIAGNOSIS — K2100 Gastro-esophageal reflux disease with esophagitis, without bleeding: Secondary | ICD-10-CM

## 2019-03-10 DIAGNOSIS — I2510 Atherosclerotic heart disease of native coronary artery without angina pectoris: Secondary | ICD-10-CM

## 2019-03-10 DIAGNOSIS — I10 Essential (primary) hypertension: Secondary | ICD-10-CM

## 2019-03-10 DIAGNOSIS — E7800 Pure hypercholesterolemia, unspecified: Secondary | ICD-10-CM | POA: Diagnosis not present

## 2019-03-10 DIAGNOSIS — G629 Polyneuropathy, unspecified: Secondary | ICD-10-CM

## 2019-03-10 DIAGNOSIS — Z0000 Encounter for general adult medical examination without abnormal findings: Secondary | ICD-10-CM | POA: Diagnosis not present

## 2019-03-10 DIAGNOSIS — F419 Anxiety disorder, unspecified: Secondary | ICD-10-CM

## 2019-03-10 NOTE — Progress Notes (Signed)
[name], female   DOB: 1951/08/16, 68 y.o.   MRN: [id number]   Subjective:    [name], female    DOB: 04-22-1951, 68 y.o.   MRN: [id number]  HPI  Patient here for her physical. She reports she is doing relatively well.  Still having increased leg pain - pain from her underlying neuropathy.  Taking lyrica.  Just increased the dose to 75mg  bid.  She sees gyn.  Gets her pelvic exams through gyn.  States gyn will schedule her mammogram as well.  No chest pain.  No sob.  No acid reflux.  No abdominal pain.  Bowels moving.  Saw cardiology 02/07/19 - stable.  No changes made.  Taking lipitor.     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)   Heart murmur    History of chicken pox    Hyperlipidemia    Hypertension    Insomnia    Past Surgical History:  Procedure Laterality Date   APPENDECTOMY     BREAST EXCISIONAL BIOPSY Right    negative over 5 years ago- neg   BREAST SURGERY     Biopsy   COLONOSCOPY WITH PROPOFOL N/A 05/16/2016   Procedure: COLONOSCOPY WITH PROPOFOL;  Surgeon: [name], MD;  Location: [facility];  Service: Endoscopy;  Laterality: N/A;   CORONARY ARTERY BYPASS GRAFT  05/01/2014   3 vessel, [facility]   ESOPHAGOGASTRODUODENOSCOPY (EGD) WITH PROPOFOL N/A 02/23/2017   Procedure: ESOPHAGOGASTRODUODENOSCOPY (EGD) WITH PROPOFOL;  Surgeon: [name], MD;  Location: [facility];  Service: Endoscopy;  Laterality: N/A;   HYSTEROSCOPY W/D&C     lipoma removed     removed from forehead   triple bypass     Family History  Problem Relation Age of Onset   Breast cancer Other        maternal great aunt   Heart disease Other        multiple family members   Heart disease Mother    Stroke Mother    Alzheimer's disease Mother    Heart disease Father    Stroke Father    Breast cancer Sister 70   Alzheimer's  disease Maternal Aunt    Alzheimer's disease Maternal Uncle    Alzheimer's disease Maternal Grandmother    Colon cancer Neg Hx    Diabetes Neg Hx    Ovarian cancer Neg Hx    Social History   Socioeconomic History   Marital status: Married    Spouse name: Not on file   Number of children: Not on file   Years of education: Not on file   Highest education level: Not on file  Occupational History   Not on file  Social Needs   Financial resource strain: Not hard at all   Food insecurity    Worry: Never true    Inability: Never true   Transportation needs    Medical: No    Non-medical: No  Tobacco Use   Smoking status: Never Smoker   Smokeless tobacco: Never Used  Substance and Sexual Activity   Alcohol use: Yes    Alcohol/week: 0.0 standard drinks    Comment: socially - 1x/3 wks   Drug use: No   Sexual activity: Yes    Birth control/protection: Post-menopausal  Lifestyle   Physical activity    Days per week: 3  days    Minutes per session: 50 min   Stress: Not at all  Relationships   Social connections    Talks on phone: Not on file    Gets together: Not on file    Attends religious service: Not on file    Active member of club or organization: Not on file    Attends meetings of clubs or organizations: Not on file    Relationship status: Not on file  Other Topics Concern   Not on file  Social History Narrative   Not on file    Outpatient Encounter Medications as of 03/10/2019  Medication Sig   Alpha-Lipoic Acid 100 MG CAPS Take by mouth.   amLODipine (NORVASC) 10 MG tablet Take 10 mg by mouth daily.   aspirin EC 81 MG tablet Take 81 mg by mouth daily.   atorvastatin (LIPITOR) 20 MG tablet TAKE 1 TABLET (20 MG TOTAL) BY MOUTH ONCE DAILY.   cyanocobalamin (,VITAMIN B-12,) 1000 MCG/ML injection Inject 1 mL (1,000 mcg total) into the muscle every 30 (thirty) days.   Docusate Calcium (STOOL SOFTENER PO) Take by mouth.    hydrochlorothiazide (HYDRODIURIL) 25 MG tablet TAKE 1 TABLET (25 MG TOTAL) BY MOUTH ONCE DAILY.   irbesartan (AVAPRO) 150 MG tablet Take 150 mg by mouth daily.   loratadine (CLARITIN) 10 MG tablet TAKE 1 TABLET BY MOUTH EVERY DAY   metoprolol tartrate (LOPRESSOR) 25 MG tablet Take 25 mg by mouth 2 (two) times daily.   Omega-3 Fatty Acids (FISH OIL) 1000 MG CAPS Take 1,200 mg by mouth daily.   pantoprazole (PROTONIX) 40 MG tablet TAKE 1 TABLET (40 MG TOTAL) BY MOUTH DAILY. TAKE 30 MINUTES BEFORE BREAKFAST   pregabalin (LYRICA) 75 MG capsule Take 1 capsule (75 mg total) by mouth 2 (two) times daily.   Syringe/Needle, Disp, (SYRINGE 3CC/25GX1") 25G X 1" 3 ML MISC Use as directed with b12 injections.   traZODone (DESYREL) 50 MG tablet TAKE 1-2 TABLETS (50-100 MG TOTAL) BY MOUTH AT BEDTIME AS NEEDED.   triamcinolone cream (KENALOG) 0.1 % Apply 1 application topically 2 (two) times daily. Use for 7 days.   VITAMIN D, ERGOCALCIFEROL, PO Take by mouth.   No facility-administered encounter medications on file as of 03/10/2019.     Review of Systems  Constitutional: Negative for appetite change and unexpected weight change.  HENT: Negative for congestion and sinus pressure.   Eyes: Negative for pain and visual disturbance.  Respiratory: Negative for cough, chest tightness and shortness of breath.   Cardiovascular: Negative for chest pain, palpitations and leg swelling.  Gastrointestinal: Negative for abdominal pain, diarrhea, nausea and vomiting.  Genitourinary: Negative for difficulty urinating and dysuria.  Musculoskeletal: Negative for joint swelling and myalgias.       Bilateral leg pain as outlined.    Skin: Negative for color change and rash.  Neurological: Negative for dizziness, light-headedness and headaches.  Hematological: Negative for adenopathy. Does not bruise/bleed easily.  Psychiatric/Behavioral: Negative for agitation and dysphoric mood.       Objective:    Physical  Exam Constitutional:      General: She is not in acute distress.    Appearance: Normal appearance. She is well-developed.  HENT:     Right Ear: External ear normal.     Left Ear: External ear normal.  Eyes:     General: No scleral icterus.       Right eye: No discharge.        Left eye: No  discharge.     Conjunctiva/sclera: Conjunctivae normal.  Neck:     Musculoskeletal: Neck supple. No muscular tenderness.     Thyroid: No thyromegaly.  Cardiovascular:     Rate and Rhythm: Normal rate and regular rhythm.  Pulmonary:     Effort: No tachypnea, accessory muscle usage or respiratory distress.     Breath sounds: Normal breath sounds. No decreased breath sounds or wheezing.  Chest:     Breasts:        Right: No inverted nipple, mass, nipple discharge or tenderness (no axillary adenopathy).        Left: No inverted nipple, mass, nipple discharge or tenderness (no axilarry adenopathy).  Abdominal:     General: Bowel sounds are normal.     Palpations: Abdomen is soft.     Tenderness: There is no abdominal tenderness.  Musculoskeletal:        General: No swelling or tenderness.  Lymphadenopathy:     Cervical: No cervical adenopathy.  Skin:    Findings: No erythema or rash.  Neurological:     Mental Status: She is alert and oriented to person, place, and time.  Psychiatric:        Mood and Affect: Mood normal.        Behavior: Behavior normal.     BP 138/84    Pulse 63    Temp (!) 97.4 F (36.3 C) (Oral)    Ht 5\' 10"  (1.778 m)    Wt 174 lb 9.6 oz (79.2 kg)    SpO2 99%    BMI 25.05 kg/m  Wt Readings from Last 3 Encounters:  03/10/19 174 lb 9.6 oz (79.2 kg)  12/05/18 173 lb (78.5 kg)  09/24/18 177 lb 3.2 oz (80.4 kg)     Lab Results  Component Value Date   WBC 5.0 03/06/2019   HGB 13.7 03/06/2019   HCT 40.4 03/06/2019   PLT 156.0 03/06/2019   GLUCOSE 81 03/06/2019   CHOL 114 03/06/2019   TRIG 87.0 03/06/2019   HDL 38.30 (L) 03/06/2019   LDLCALC 58 03/06/2019   ALT  22 03/06/2019   AST 21 03/06/2019   NA 140 03/06/2019   K 3.6 03/06/2019   CL 103 03/06/2019   CREATININE 0.81 03/06/2019   BUN 15 03/06/2019   CO2 30 03/06/2019   TSH 4.01 03/06/2019    Mr Brain W Wo Contrast  Addendum Date: 10/03/2018   ADDENDUM REPORT: 10/03/2018 07:28 ADDENDUM: Speech recognition error. Impression should read: No abnormality seen to explain pulsatile tinnitus. No vestibular schwannoma or other regional abnormality. Electronically Signed   By: Nelson Chimes M.D.   On: 10/03/2018 07:28   Result Date: 10/03/2018 CLINICAL DATA:  Pulsatile tinnitus over the last 2 months, left worse than right. EXAM: MRI HEAD WITHOUT AND WITH CONTRAST TECHNIQUE: Multiplanar, multiecho pulse sequences of the brain and surrounding structures were obtained without and with intravenous contrast. CONTRAST:  7.5 cc Gadavist COMPARISON:  07/18/2016 FINDINGS: Brain: Diffusion imaging does not show any acute or subacute infarction or other cause of restricted diffusion. The brainstem and cerebellum are normal. CP angle regions are normal. No vestibular schwannoma or other mass lesion. No abnormal vascular structure identified. No enhancing neuritis. Within the cerebral hemispheres, there is a single focus of T2 and FLAIR signal within the deep white matter on each side. The remainder of the cerebral hemispheres are normal. As isolated findings, these are not likely significant. One could not rule out the earliest manifestation of small vessel  change or the most minimal manifestation of inactive demyelinating disease. No evidence of mass, hemorrhage, hydrocephalus or extra-axial collection. No abnormal enhancement occurs elsewhere. Vascular: Major vessels at the base of the brain show flow. Skull and upper cervical spine: Negative Sinuses/Orbits: Clear/normal Other: None IMPRESSION: No abnormality seen to explain pulsatile all tentative a spirit no vestibular schwannoma or other regional abnormality. Normal  appearance of the brain, with exception of 2 deep white matter foci, not likely to be significant as described above. Electronically Signed: By: Nelson Chimes M.D. On: 09/24/2018 07:13       Assessment & Plan:   Problem List Items Addressed This Visit    Anxiety    Overall doing well.  On trazodone.  Follow.  Stable.       CAD (coronary artery disease)    Followed by cardiology.  Continue risk factor modification.  Currently without symptoms.        Esophagitis, reflux    Controlled on protonix.        Essential hypertension    Blood pressure as outlined.  Have her spot check her pressure.  Continue current medication regimen.  Follow pressures.  Follow metabolic panel.        Relevant Orders   Basic metabolic panel   Health care maintenance    Physical today 03/10/19.  Colonoscopy 05/2016 - inflammation.  Recommended f/u colonoscopy in 10 years.  Mammogram 05/17/18 - Birads I.  GYN will schedule mammogram.       Hypercholesterolemia    Low cholesterol diet and exercise.  On lipitor.  Follow lipid panel and liver fucntion tests.   Lab Results  Component Value Date   CHOL 114 03/06/2019   HDL 38.30 (L) 03/06/2019   LDLCALC 58 03/06/2019   TRIG 87.0 03/06/2019   CHOLHDL 3 03/06/2019        Relevant Orders   Hepatic function panel   Lipid panel   Neuropathy    Had NCS - polyneuropathy.  Saw neurology.  On lyrica.  Just increased the dose to 75mg  bid.  Follow.            Einar Pheasant, MD

## 2019-03-10 NOTE — Assessment & Plan Note (Addendum)
Physical today 03/10/19.  Colonoscopy 05/2016 - inflammation.  Recommended f/u colonoscopy in 10 years.  Mammogram 05/17/18 - Birads I.  GYN will schedule mammogram.

## 2019-03-14 ENCOUNTER — Encounter: Payer: Self-pay | Admitting: Internal Medicine

## 2019-03-14 NOTE — Assessment & Plan Note (Signed)
Overall doing well.  On trazodone.  Follow.  Stable.

## 2019-03-14 NOTE — Assessment & Plan Note (Signed)
Controlled on protonix.   

## 2019-03-14 NOTE — Assessment & Plan Note (Signed)
Had NCS - polyneuropathy.  Saw neurology.  On lyrica.  Just increased the dose to 75mg  bid.  Follow.

## 2019-03-14 NOTE — Assessment & Plan Note (Signed)
Blood pressure as outlined.  Have her spot check her pressure.  Continue current medication regimen.  Follow pressures.  Follow metabolic panel.

## 2019-03-14 NOTE — Assessment & Plan Note (Signed)
Low cholesterol diet and exercise.  On lipitor.  Follow lipid panel and liver fucntion tests.   Lab Results  Component Value Date   CHOL 114 03/06/2019   HDL 38.30 (L) 03/06/2019   LDLCALC 58 03/06/2019   TRIG 87.0 03/06/2019   CHOLHDL 3 03/06/2019

## 2019-03-14 NOTE — Assessment & Plan Note (Signed)
Followed by cardiology.  Continue risk factor modification.  Currently without symptoms.   

## 2019-03-29 ENCOUNTER — Other Ambulatory Visit: Payer: Self-pay | Admitting: Internal Medicine

## 2019-04-03 ENCOUNTER — Other Ambulatory Visit: Payer: Self-pay | Admitting: Internal Medicine

## 2019-04-03 DIAGNOSIS — Z1231 Encounter for screening mammogram for malignant neoplasm of breast: Secondary | ICD-10-CM

## 2019-04-24 DIAGNOSIS — D235 Other benign neoplasm of skin of trunk: Secondary | ICD-10-CM | POA: Diagnosis not present

## 2019-04-24 DIAGNOSIS — L821 Other seborrheic keratosis: Secondary | ICD-10-CM | POA: Diagnosis not present

## 2019-04-24 DIAGNOSIS — D3612 Benign neoplasm of peripheral nerves and autonomic nervous system, upper limb, including shoulder: Secondary | ICD-10-CM | POA: Diagnosis not present

## 2019-04-24 DIAGNOSIS — L270 Generalized skin eruption due to drugs and medicaments taken internally: Secondary | ICD-10-CM | POA: Diagnosis not present

## 2019-05-01 ENCOUNTER — Other Ambulatory Visit: Payer: Self-pay | Admitting: Internal Medicine

## 2019-05-02 NOTE — Telephone Encounter (Signed)
Refilled: 03/05/2019 Last OV: 03/10/2019 Next OV: 06/24/2019

## 2019-05-20 ENCOUNTER — Ambulatory Visit
Admission: RE | Admit: 2019-05-20 | Discharge: 2019-05-20 | Disposition: A | Discharge status: Home / Self Care | Payer: PPO | Source: Ambulatory Visit | Attending: Internal Medicine | Admitting: Internal Medicine

## 2019-05-20 DIAGNOSIS — Z1231 Encounter for screening mammogram for malignant neoplasm of breast: Secondary | ICD-10-CM | POA: Diagnosis not present

## 2019-05-22 DIAGNOSIS — G44099 Other trigeminal autonomic cephalgias (TAC), not intractable: Secondary | ICD-10-CM | POA: Diagnosis not present

## 2019-05-22 DIAGNOSIS — G608 Other hereditary and idiopathic neuropathies: Secondary | ICD-10-CM | POA: Diagnosis not present

## 2019-05-22 DIAGNOSIS — G2581 Restless legs syndrome: Secondary | ICD-10-CM | POA: Diagnosis not present

## 2019-05-22 DIAGNOSIS — M4807 Spinal stenosis, lumbosacral region: Secondary | ICD-10-CM | POA: Diagnosis not present

## 2019-05-22 DIAGNOSIS — G939 Disorder of brain, unspecified: Secondary | ICD-10-CM | POA: Diagnosis not present

## 2019-05-28 ENCOUNTER — Other Ambulatory Visit: Payer: Self-pay | Admitting: Internal Medicine

## 2019-05-28 NOTE — Telephone Encounter (Signed)
Refilled: 05/03/2019 Last OV: 03/10/2019 Next OV: 06/24/2019

## 2019-05-28 NOTE — Telephone Encounter (Signed)
She is now on lyrica 75mg  bid.  (lyrica 25mg  prescription denied).

## 2019-06-20 ENCOUNTER — Other Ambulatory Visit: Payer: Self-pay

## 2019-06-20 ENCOUNTER — Other Ambulatory Visit (INDEPENDENT_AMBULATORY_CARE_PROVIDER_SITE_OTHER): Payer: PPO

## 2019-06-20 DIAGNOSIS — I10 Essential (primary) hypertension: Secondary | ICD-10-CM | POA: Diagnosis not present

## 2019-06-20 DIAGNOSIS — E7800 Pure hypercholesterolemia, unspecified: Secondary | ICD-10-CM

## 2019-06-20 LAB — BASIC METABOLIC PANEL
BUN: 14 mg/dL (ref 6–23)
CO2: 32 mEq/L (ref 19–32)
Calcium: 10.6 mg/dL — ABNORMAL HIGH (ref 8.4–10.5)
Chloride: 103 mEq/L (ref 96–112)
Creatinine, Ser: 0.86 mg/dL (ref 0.40–1.20)
GFR: 65.61 mL/min (ref >60.00)
Glucose, Bld: 87 mg/dL (ref 70–99)
Potassium: 4.1 mEq/L (ref 3.5–5.1)
Sodium: 141 mEq/L (ref 135–145)

## 2019-06-20 LAB — HEPATIC FUNCTION PANEL
ALT: 18 U/L (ref 0–35)
AST: 17 U/L (ref 0–37)
Albumin: 4.4 g/dL (ref 3.5–5.2)
Alkaline Phosphatase: 36 U/L — ABNORMAL LOW (ref 39–117)
Bilirubin, Direct: 0.1 mg/dL (ref 0.0–0.3)
Total Bilirubin: 0.7 mg/dL (ref 0.2–1.2)
Total Protein: 6.9 g/dL (ref 6.0–8.3)

## 2019-06-20 LAB — LIPID PANEL
Cholesterol: 115 mg/dL (ref 0–200)
HDL: 38.1 mg/dL — ABNORMAL LOW (ref >39.00)
LDL Cholesterol: 60 mg/dL (ref 0–99)
NonHDL: 76.63
Total CHOL/HDL Ratio: 3
Triglycerides: 82 mg/dL (ref 0.0–149.0)
VLDL: 16.4 mg/dL (ref 0.0–40.0)

## 2019-06-24 ENCOUNTER — Ambulatory Visit (INDEPENDENT_AMBULATORY_CARE_PROVIDER_SITE_OTHER): Payer: PPO

## 2019-06-24 ENCOUNTER — Ambulatory Visit (INDEPENDENT_AMBULATORY_CARE_PROVIDER_SITE_OTHER): Payer: PPO | Admitting: Internal Medicine

## 2019-06-24 ENCOUNTER — Other Ambulatory Visit: Payer: Self-pay

## 2019-06-24 ENCOUNTER — Encounter: Payer: Self-pay | Admitting: Internal Medicine

## 2019-06-24 DIAGNOSIS — F419 Anxiety disorder, unspecified: Secondary | ICD-10-CM | POA: Diagnosis not present

## 2019-06-24 DIAGNOSIS — E7800 Pure hypercholesterolemia, unspecified: Secondary | ICD-10-CM

## 2019-06-24 DIAGNOSIS — Z0000 Encounter for general adult medical examination without abnormal findings: Secondary | ICD-10-CM | POA: Diagnosis not present

## 2019-06-24 DIAGNOSIS — I2510 Atherosclerotic heart disease of native coronary artery without angina pectoris: Secondary | ICD-10-CM | POA: Diagnosis not present

## 2019-06-24 DIAGNOSIS — I10 Essential (primary) hypertension: Secondary | ICD-10-CM

## 2019-06-24 DIAGNOSIS — Z23 Encounter for immunization: Secondary | ICD-10-CM | POA: Diagnosis not present

## 2019-06-24 DIAGNOSIS — K2100 Gastro-esophageal reflux disease with esophagitis, without bleeding: Secondary | ICD-10-CM

## 2019-06-24 DIAGNOSIS — G629 Polyneuropathy, unspecified: Secondary | ICD-10-CM

## 2019-06-24 NOTE — Patient Instructions (Signed)
  Alexandra Mendoza , Thank you for taking time to come for your Medicare Wellness Visit. I appreciate your ongoing commitment to your health goals. Please review the following plan we discussed and let me know if I can assist you in the future.   These are the goals we discussed: Goals    . Follow up with Primary Care Provider     As needed       This is a list of the screening recommended for you and due dates:  Health Maintenance  Topic Date Due  . Flu Shot  12/10/2019*  . Pneumonia vaccines (2 of 2 - PPSV23) 07/03/2019  . Mammogram  05/19/2020  . Tetanus Vaccine  08/15/2025  . Colon Cancer Screening  05/16/2026  . DEXA scan (bone density measurement)  Completed  .  Hepatitis C: One time screening is recommended by Center for Disease Control  (CDC) for  adults born from [birth year] through 1965.   Completed  *Topic was postponed. The date shown is not the original due date.

## 2019-06-24 NOTE — Progress Notes (Signed)
[name], female   DOB: Apr 12, 1951, 68 y.o.   MRN: [id number]   Subjective:    [name], female    DOB: Dec 30, 1950, 68 y.o.   MRN: [id number]  HPI  Patient here for a scheduled follow up. Saw Dr Manuella Ghazi recently.  He instructed her to increase lyrica to 150mg  bid.  She prefers to continue on 75mg  - two q hs and one in the am.  Request rx be written with 75mg  tablets.  No chest pain.  No sob.  No acid reflux.  No abdominal pain.  Bowels moving.  Overall she feels she is doing relatively well.  Handling stress.  Blood pressure doing well.     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)   Heart murmur    History of chicken pox    Hyperlipidemia    Hypertension    Insomnia    Past Surgical History:  Procedure Laterality Date   APPENDECTOMY     BREAST EXCISIONAL BIOPSY Right    negative over 5 years ago- neg   BREAST SURGERY     Biopsy   COLONOSCOPY WITH PROPOFOL N/A 05/16/2016   Procedure: COLONOSCOPY WITH PROPOFOL;  Surgeon: [name], MD;  Location: [facility] END[facility];  Service: Endoscopy;  Laterality: N/A;   CORONARY ARTERY BYPASS GRAFT  05/01/2014   3 vessel, [facility]   ESOPHAGOGASTRODUODENOSCOPY (EGD) WITH PROPOFOL N/A 02/23/2017   Procedure: ESOPHAGOGASTRODUODENOSCOPY (EGD) WITH PROPOFOL;  Surgeon: [name], MD;  Location: [place];  Service: Endoscopy;  Laterality: N/A;   HYSTEROSCOPY W/D&C     lipoma removed     removed from forehead   triple bypass     Family History  Problem Relation Age of Onset   Breast cancer Other        maternal great aunt   Heart disease Other        multiple family members   Heart disease Mother    Stroke Mother    Alzheimer's disease Mother    Heart disease Father    Stroke Father    Breast cancer Sister 67   Alzheimer's disease Maternal Aunt    Alzheimer's disease  Maternal Uncle    Alzheimer's disease Maternal Grandmother    Colon cancer Neg Hx    Diabetes Neg Hx    Ovarian cancer Neg Hx    Social History   Socioeconomic History   Marital status: Married    Spouse name: Not on file   Number of children: Not on file   Years of education: Not on file   Highest education level: Not on file  Occupational History   Not on file  Social Needs   Financial resource strain: Not hard at all   Food insecurity    Worry: Never true    Inability: Never true   Transportation needs    Medical: No    Non-medical: No  Tobacco Use   Smoking status: Never Smoker   Smokeless tobacco: Never Used  Substance and Sexual Activity   Alcohol use: Never    Alcohol/week: 0.0 standard drinks    Frequency: Never   Drug use: No   Sexual activity: Yes    Birth control/protection: Post-menopausal  Lifestyle   Physical activity    Days per week: 3 days    Minutes per session: 50 min  Stress: Not at all  Relationships   Social connections    Talks on phone: Not on file    Gets together: Not on file    Attends religious service: Not on file    Active member of club or organization: Not on file    Attends meetings of clubs or organizations: Not on file    Relationship status: Not on file  Other Topics Concern   Not on file  Social History Narrative   Not on file    Outpatient Encounter Medications as of 06/24/2019  Medication Sig   Alpha-Lipoic Acid 100 MG CAPS Take by mouth.   amLODipine (NORVASC) 10 MG tablet Take 10 mg by mouth daily.   aspirin EC 81 MG tablet Take 81 mg by mouth daily.   atorvastatin (LIPITOR) 20 MG tablet TAKE 1 TABLET (20 MG TOTAL) BY MOUTH ONCE DAILY.   cyanocobalamin (,VITAMIN B-12,) 1000 MCG/ML injection Inject 1 mL (1,000 mcg total) into the muscle every 30 (thirty) days.   Docusate Calcium (STOOL SOFTENER PO) Take by mouth.   hydrochlorothiazide (HYDRODIURIL) 25 MG tablet TAKE 1 TABLET (25 MG  TOTAL) BY MOUTH ONCE DAILY.   irbesartan (AVAPRO) 150 MG tablet Take 150 mg by mouth daily.   metoprolol tartrate (LOPRESSOR) 25 MG tablet Take 25 mg by mouth 2 (two) times daily.   Omega-3 Fatty Acids (FISH OIL) 1000 MG CAPS Take 1,200 mg by mouth daily.   pantoprazole (PROTONIX) 40 MG tablet TAKE 1 TABLET (40 MG TOTAL) BY MOUTH DAILY. TAKE 30 MINUTES BEFORE BREAKFAST   Syringe/Needle, Disp, (SYRINGE 3CC/25GX1") 25G X 1" 3 ML MISC Use as directed with b12 injections.   traZODone (DESYREL) 50 MG tablet TAKE 1-2 TABLETS (50-100 MG TOTAL) BY MOUTH AT BEDTIME AS NEEDED.   triamcinolone cream (KENALOG) 0.1 % Apply 1 application topically 2 (two) times daily. Use for 7 days.   VITAMIN D, ERGOCALCIFEROL, PO Take by mouth.   [DISCONTINUED] loratadine (CLARITIN) 10 MG tablet TAKE 1 TABLET BY MOUTH EVERY DAY   [DISCONTINUED] pregabalin (LYRICA) 75 MG capsule TAKE 1 CAPSULE BY MOUTH TWICE A DAY   No facility-administered encounter medications on file as of 06/24/2019.     Review of Systems  Constitutional: Negative for appetite change and unexpected weight change.  HENT: Negative for congestion and sinus pressure.   Respiratory: Negative for cough, chest tightness and shortness of breath.   Cardiovascular: Negative for chest pain, palpitations and leg swelling.  Gastrointestinal: Negative for abdominal pain, diarrhea, nausea and vomiting.  Genitourinary: Negative for difficulty urinating and dysuria.  Musculoskeletal: Negative for joint swelling and myalgias.       Restless legs - persist.    Skin: Negative for color change and rash.  Neurological: Negative for dizziness, light-headedness and headaches.  Psychiatric/Behavioral: Negative for agitation and dysphoric mood.       Objective:    Physical Exam Constitutional:      General: She is not in acute distress.    Appearance: Normal appearance.  HENT:     Head: Normocephalic and atraumatic.     Right Ear: External ear normal.       Left Ear: External ear normal.  Eyes:     General: No scleral icterus.       Right eye: No discharge.        Left eye: No discharge.     Conjunctiva/sclera: Conjunctivae normal.  Neck:     Musculoskeletal: Neck supple. No muscular tenderness.     Thyroid:  No thyromegaly.  Cardiovascular:     Rate and Rhythm: Normal rate and regular rhythm.  Pulmonary:     Effort: No respiratory distress.     Breath sounds: Normal breath sounds. No wheezing.  Abdominal:     General: Bowel sounds are normal.     Palpations: Abdomen is soft.     Tenderness: There is no abdominal tenderness.  Musculoskeletal:        General: No swelling or tenderness.  Lymphadenopathy:     Cervical: No cervical adenopathy.  Skin:    Findings: No erythema or rash.  Neurological:     Mental Status: She is alert.  Psychiatric:        Behavior: Behavior normal.     BP (!) 148/68    Pulse 64    Temp (!) 97.2 F (36.2 C)    Resp 16    Wt 173 lb 12.8 oz (78.8 kg)    SpO2 98%    BMI 24.94 kg/m  Wt Readings from Last 3 Encounters:  06/27/19 176 lb (79.8 kg)  06/24/19 173 lb 12.8 oz (78.8 kg)  03/10/19 174 lb 9.6 oz (79.2 kg)     Lab Results  Component Value Date   WBC 5.0 03/06/2019   HGB 13.7 03/06/2019   HCT 40.4 03/06/2019   PLT 156.0 03/06/2019   GLUCOSE 87 06/20/2019   CHOL 115 06/20/2019   TRIG 82.0 06/20/2019   HDL 38.10 (L) 06/20/2019   LDLCALC 60 06/20/2019   ALT 18 06/20/2019   AST 17 06/20/2019   NA 141 06/20/2019   K 4.1 06/20/2019   CL 103 06/20/2019   CREATININE 0.86 06/20/2019   BUN 14 06/20/2019   CO2 32 06/20/2019   TSH 4.01 03/06/2019    Mm 3d Screen Breast Bilateral  Result Date: 05/20/2019 CLINICAL DATA:  Screening. EXAM: DIGITAL SCREENING BILATERAL MAMMOGRAM WITH TOMO AND CAD COMPARISON:  Previous exam(s). ACR Breast Density Category c: The breast tissue is heterogeneously dense, which may obscure small masses. FINDINGS: There are no findings suspicious for malignancy.  Images were processed with CAD. IMPRESSION: No mammographic evidence of malignancy. A result letter of this screening mammogram will be mailed directly to the patient. RECOMMENDATION: Screening mammogram in one year. (Code:SM-B-01Y) BI-RADS CATEGORY  1: Negative. Electronically Signed   By: Everlean Alstrom M.D.   On: 05/20/2019 15:12       Assessment & Plan:   Problem List Items Addressed This Visit    Anxiety    Doing well on current regimen.  Follow.        CAD (coronary artery disease)    Followed by cardiology.  Overall feels she is doing well.  Continue risk factor modification.        Esophagitis, reflux    Controlled on current regimen.        Essential hypertension    Blood pressure elevated today.  States under good control at home.  Follow pressures.  Continue current medication regimen.  Send in readings.  Make adjustment if persistent elevation.  Follow metabolic panel.        Hypercalcemia    Slight elevation on recent lab.  Recheck calcium, ionized calcium and vitamin D.        Relevant Orders   VITAMIN D 25 Hydroxy (Vit-D Deficiency, Fractures)   Calcium   Calcium, ionized   Hypercholesterolemia    On lipitor.  Low cholesterol diet and exercise.  Follow lipid panel and liver function tests.  Neuropathy    Had NCS - polyneuropathy.  Saw neurology.  Discussed increasing lyrica to 150mg  bid.  She wants to continue on 75mg  in am (with option to take 1-2 in am) and two in pm.  rx written.        Relevant Orders   Vitamin B12    Other Visit Diagnoses    Need for immunization against influenza       Relevant Orders   Flu Vaccine QUAD High Dose(Fluad) (Completed)       Einar Pheasant, MD

## 2019-06-24 NOTE — Progress Notes (Addendum)
Subjective:   Alexandra Mendoza is a 68 y.o. female who presents for Medicare Annual (Subsequent) preventive examination.  Review of Systems:  No ROS.  Medicare Wellness Virtual Visit.  Visual/audio telehealth visit, UTA vital signs.   See social history for additional risk factors.   Cardiac Risk Factors include: advanced age (>70men, >77 women);hypertension     Objective:     Vitals: There were no vitals taken for this visit.  There is no height or weight on file to calculate BMI.  Advanced Directives 06/24/2019 05/14/2018 02/23/2017 02/06/2016  Does Patient Have a Medical Advance Directive? Yes Yes No No  Type of Paramedic of [place];Living will [place];Living will - -  Does patient want to make changes to medical advance directive? No - Patient declined No - Patient declined - -  Copy of [place] in Chart? No - copy requested No - copy requested - -  Would patient like information on creating a medical advance directive? - - Yes (MAU/Ambulatory/Procedural Areas - Information given) No - patient declined information    Tobacco Social History   Tobacco Use  Smoking Status Never Smoker  Smokeless Tobacco Never Used     Counseling given: Not Answered   Clinical Intake:  Pre-visit preparation completed: Yes        Diabetes: No  How often do you need to have someone help you when you read instructions, pamphlets, or other written materials from your doctor or pharmacy?: 1 - Never  Interpreter Needed?: No     Past Medical History:  Diagnosis Date  . Acid reflux   . Coronary artery disease   . 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 Heart murmur   . History of chicken pox   . Hyperlipidemia   . Hypertension   . Insomnia    Past Surgical History:  Procedure Laterality Date  . APPENDECTOMY    . BREAST EXCISIONAL BIOPSY Right    negative over 5 years ago- neg  . BREAST SURGERY     Biopsy  . COLONOSCOPY WITH PROPOFOL N/A 05/16/2016   Procedure: COLONOSCOPY WITH PROPOFOL;  Surgeon: [name], MD;  Location: [facility];  Service: Endoscopy;  Laterality: N/A;  . CORONARY ARTERY BYPASS GRAFT  05/01/2014   3 vessel, [facility]  . ESOPHAGOGASTRODUODENOSCOPY (EGD) WITH PROPOFOL N/A 02/23/2017   Procedure: ESOPHAGOGASTRODUODENOSCOPY (EGD) WITH PROPOFOL;  Surgeon: [name], MD;  Location: [place];  Service: Endoscopy;  Laterality: N/A;  . HYSTEROSCOPY W/D&C    . lipoma removed     removed from forehead  . triple bypass     Family History  Problem Relation Age of Onset  . Breast cancer Other        maternal great aunt  . Heart disease Other        multiple family members  . Heart disease Mother   . Stroke Mother   . Alzheimer's disease Mother   . Heart disease Father   . Stroke Father   . Breast cancer Sister 24  . Alzheimer's disease Maternal Aunt   . Alzheimer's disease Maternal Uncle   . Alzheimer's disease Maternal Grandmother   . Colon cancer Neg Hx   . Diabetes Neg Hx   . Ovarian cancer Neg Hx    Social History   Socioeconomic History  . Marital status: Married    Spouse name: Not on file  . Number of children:  Not on file  . Years of education: Not on file  . Highest education level: Not on file  Occupational History  . Not on file  Social Needs  . Financial resource strain: Not hard at all  . Food insecurity    Worry: Never true    Inability: Never true  . Transportation needs    Medical: No    Non-medical: No  Tobacco Use  . Smoking status: Never Smoker  . Smokeless tobacco: Never Used  Substance and Sexual Activity  . Alcohol use: Never    Alcohol/week: 0.0 standard drinks    Frequency: Never  . Drug use: No  . Sexual activity: Yes    Birth control/protection: Post-menopausal  Lifestyle  . Physical activity    Days per week: 3 days    Minutes per session: 50 min  . Stress: Not at all  Relationships  .  Social Herbalist on phone: Not on file    Gets together: Not on file    Attends religious service: Not on file    Active member of club or organization: Not on file    Attends meetings of clubs or organizations: Not on file    Relationship status: Not on file  Other Topics Concern  . Not on file  Social History Narrative  . Not on file    Outpatient Encounter Medications as of 06/24/2019  Medication Sig  . Alpha-Lipoic Acid 100 MG CAPS Take by mouth.  Marland Kitchen amLODipine (NORVASC) 10 MG tablet Take 10 mg by mouth daily.  Marland Kitchen aspirin EC 81 MG tablet Take 81 mg by mouth daily.  Marland Kitchen atorvastatin (LIPITOR) 20 MG tablet TAKE 1 TABLET (20 MG TOTAL) BY MOUTH ONCE DAILY.  . cyanocobalamin (,VITAMIN B-12,) 1000 MCG/ML injection Inject 1 mL (1,000 mcg total) into the muscle every 30 (thirty) days.  Mariane Baumgarten Calcium (STOOL SOFTENER PO) Take by mouth.  . hydrochlorothiazide (HYDRODIURIL) 25 MG tablet TAKE 1 TABLET (25 MG TOTAL) BY MOUTH ONCE DAILY.  Marland Kitchen irbesartan (AVAPRO) 150 MG tablet Take 150 mg by mouth daily.  Marland Kitchen loratadine (CLARITIN) 10 MG tablet TAKE 1 TABLET BY MOUTH EVERY DAY  . metoprolol tartrate (LOPRESSOR) 25 MG tablet Take 25 mg by mouth 2 (two) times daily.  . Omega-3 Fatty Acids (FISH OIL) 1000 MG CAPS Take 1,200 mg by mouth daily.  . pantoprazole (PROTONIX) 40 MG tablet TAKE 1 TABLET (40 MG TOTAL) BY MOUTH DAILY. TAKE 30 MINUTES BEFORE BREAKFAST  . pregabalin (LYRICA) 75 MG capsule TAKE 1 CAPSULE BY MOUTH TWICE A DAY  . Syringe/Needle, Disp, (SYRINGE 3CC/25GX1") 25G X 1" 3 ML MISC Use as directed with b12 injections.  . traZODone (DESYREL) 50 MG tablet TAKE 1-2 TABLETS (50-100 MG TOTAL) BY MOUTH AT BEDTIME AS NEEDED.  Marland Kitchen triamcinolone cream (KENALOG) 0.1 % Apply 1 application topically 2 (two) times daily. Use for 7 days.  Marland Kitchen VITAMIN D, ERGOCALCIFEROL, PO Take by mouth.   No facility-administered encounter medications on file as of 06/24/2019.     Activities of Daily Living In  your present state of health, do you have any difficulty performing the following activities: 06/24/2019  Hearing? N  Vision? N  Difficulty concentrating or making decisions? N  Walking or climbing stairs? N  Dressing or bathing? N  Doing errands, shopping? N  Preparing Food and eating ? N  Using the Toilet? N  In the past six months, have you accidently leaked urine? N  Do you have  problems with loss of bowel control? N  Managing your Medications? N  Managing your Finances? N  Housekeeping or managing your Housekeeping? N  Some recent data might be hidden    Patient Care Team: Einar Pheasant, MD as PCP - General (Internal Medicine)    Assessment:   This is a routine wellness examination for Alexandra Mendoza.  I connected with patient 06/24/19 at  8:30 AM EDT by an audio enabled telemedicine application and verified that I am speaking with the correct person using two identifiers. Patient stated full name and DOB. Patient gave permission to continue with virtual visit. Patient's location was at home and Nurse's location was at [place] office.   Health Maintenance Due: -Influenza vaccine 2020- discussed; to be completed in season with doctor or local pharmacy.   Update all pending maintenance due as appropriate.   See completed HM at the end of note.   Eye: Visual acuity not assessed. Virtual visit. Plans to schedule in January 2021.  Dental: Visits every 6 months.    Hearing: Demonstrates normal hearing during visit.  Safety:  Patient feels safe at home- yes Patient does have smoke detectors at home- yes Patient does wear sunscreen or protective clothing when in direct sunlight - yes Patient does wear seat belt when in a moving vehicle - yes Patient drives- yes Adequate lighting in walkways free from debris- yes Grab bars and handrails used as appropriate- yes Ambulates with no assistive device Cell phone on person when ambulating outside of the home- yes  Social: Alcohol  intake - yes      Smoking history- never  Smokers in home? none Illicit drug use? none  Depression: PHQ 2 &9 complete. See screening below. Denies irritability, anhedonia, sadness/tearfullness.   Falls: See screening below.    Medication: Taking as directed and without issues.   Covid-19: Precautions and sickness symptoms discussed. Wears mask, social distancing, hand hygiene as appropriate.   Activities of Daily Living Patient denies needing assistance with: household chores, feeding themselves, getting from bed to chair, getting to the toilet, bathing/showering, dressing, managing money, or preparing meals.   Memory: Patient is alert. Patient denies difficulty focusing or concentrating. Correctly identified the president of the [place], season and recall. Patient likes to read for brain stimulation.  BMI- discussed the importance of a healthy diet, water intake and the benefits of aerobic exercise.  Educational material provided.  Physical activity- walking 3-4 days weekly, 30-40 minutes  Diet: Heart healthy Water: good intake Caffeine: 1 cup of caffeine  Other Providers Patient Care Team: Einar Pheasant, MD as PCP - General (Internal Medicine)  Exercise Activities and Dietary recommendations Current Exercise Habits: Home exercise routine, Type of exercise: walking, Time (Minutes): 40, Frequency (Times/Week): 4, Weekly Exercise (Minutes/Week): 160, Intensity: Moderate  Goals    . Follow up with Primary Care Provider     As needed       Fall Risk Fall Risk  06/24/2019 03/10/2019 05/14/2018 07/17/2017 07/13/2016  Falls in the past year? 0 0 No No No   Timed Get Up and Go performed: no, virtual visit  Depression Screen PHQ 2/9 Scores 06/24/2019 03/10/2019 06/18/2018 05/14/2018  PHQ - 2 Score 0 0 1 0  PHQ- 9 Score - - 3 -     Cognitive Function MMSE - Mini Mental State Exam 05/14/2018  Orientation to time 5  Orientation to Place 5  Registration 3  Attention/  Calculation 5  Recall 3  Language- name 2 objects 2  Language-  repeat 1  Language- follow 3 step command 3  Language- read & follow direction 1  Write a sentence 1  Copy design 1  Total score 30     6CIT Screen 06/24/2019  What Year? 0 points  What month? 0 points  What time? 0 points  Count back from 20 0 points  Months in reverse 0 points  Repeat phrase 0 points  Total Score 0    Immunization History  Administered Date(s) Administered  . Influenza, High Dose Seasonal PF 06/22/2016, 06/25/2017, 06/21/2018  . Influenza,inj,Quad PF,6+ Mos 07/02/2014, 06/22/2015  . Influenza-Unspecified 07/02/2014  . Pneumococcal Conjugate-13 08/29/2016  . Pneumococcal Polysaccharide-23 07/02/2014  . Tdap 08/16/2015  . Zoster 04/06/2012, 09/11/2013  . Zoster Recombinat (Shingrix) 09/24/2017, 01/01/2018   Screening Tests Health Maintenance  Topic Date Due  . INFLUENZA VACCINE  12/10/2019 (Originally 04/12/2019)  . PNA vac Low Risk Adult (2 of 2 - PPSV23) 07/03/2019  . MAMMOGRAM  05/19/2020  . TETANUS/TDAP  08/15/2025  . COLONOSCOPY  05/16/2026  . DEXA SCAN  Completed  . Hepatitis C Screening  Completed      Plan:   Keep all routine maintenance appointments.   Follow up with your doctor in office today at 1030. Influenza vaccine.   Medicare Attestation I have personally reviewed: The patient's medical and social history Their use of alcohol, tobacco or illicit drugs Their current medications and supplements The patient's functional ability including ADLs,fall risks, home safety risks, cognitive, and hearing and visual impairment Diet and physical activities Evidence for depression    In addition, I have reviewed and discussed with patient certain preventive protocols, quality metrics, and best practice recommendations. A written personalized care plan for preventive services as well as general preventive health recommendations were provided to patient via mail.      Varney Biles, LPN  [id number]   Reviewed above information.  Agree with assessment and plan.    Dr Nicki Reaper

## 2019-06-26 ENCOUNTER — Other Ambulatory Visit: Payer: Self-pay | Admitting: Family Medicine

## 2019-06-26 ENCOUNTER — Telehealth: Payer: Self-pay | Admitting: Internal Medicine

## 2019-06-26 DIAGNOSIS — R21 Rash and other nonspecific skin eruption: Secondary | ICD-10-CM

## 2019-06-26 MED ORDER — PREGABALIN 75 MG PO CAPS: ORAL_CAPSULE | ORAL | 1 refills | Status: DC

## 2019-06-26 NOTE — Telephone Encounter (Signed)
Pt called a had a few questions in regards to the pregabalin (LYRICA) 75 MG capsule being increased  Pt also wants this sent to [facility]

## 2019-06-26 NOTE — Telephone Encounter (Signed)
rx sent in to [facility] with new directions on lyrica

## 2019-06-26 NOTE — Telephone Encounter (Signed)
Patient states that she had discussed increasing her lyrica at her last visit. I think she saw Dr Manuella Ghazi recently. Just need to know how the script needs to be sent.

## 2019-06-26 NOTE — Telephone Encounter (Signed)
Pt aware.

## 2019-06-27 ENCOUNTER — Ambulatory Visit (INDEPENDENT_AMBULATORY_CARE_PROVIDER_SITE_OTHER): Payer: PPO | Admitting: Obstetrics & Gynecology

## 2019-06-27 ENCOUNTER — Encounter: Payer: Self-pay | Admitting: Obstetrics & Gynecology

## 2019-06-27 ENCOUNTER — Other Ambulatory Visit (HOSPITAL_COMMUNITY)
Admission: RE | Admit: 2019-06-27 | Discharge: 2019-06-27 | Disposition: A | Discharge status: Home / Self Care | Payer: PPO | Source: Ambulatory Visit | Attending: Obstetrics & Gynecology | Admitting: Obstetrics & Gynecology

## 2019-06-27 ENCOUNTER — Other Ambulatory Visit: Payer: Self-pay

## 2019-06-27 VITALS — BP 138/80 | Ht 70.0 in | Wt 176.0 lb

## 2019-06-27 DIAGNOSIS — Z1151 Encounter for screening for human papillomavirus (HPV): Secondary | ICD-10-CM | POA: Insufficient documentation

## 2019-06-27 DIAGNOSIS — Z124 Encounter for screening for malignant neoplasm of cervix: Secondary | ICD-10-CM

## 2019-06-27 DIAGNOSIS — Z780 Asymptomatic menopausal state: Secondary | ICD-10-CM | POA: Diagnosis not present

## 2019-06-27 DIAGNOSIS — Z01419 Encounter for gynecological examination (general) (routine) without abnormal findings: Secondary | ICD-10-CM

## 2019-06-27 DIAGNOSIS — Z1211 Encounter for screening for malignant neoplasm of colon: Secondary | ICD-10-CM

## 2019-06-27 NOTE — Patient Instructions (Signed)
PAP every three years Mammogram every year    Call [phone] to schedule at [place] Colonoscopy every 10 years Labs yearly (with PCP)   

## 2019-06-27 NOTE — Progress Notes (Signed)
HPI:      [name] is a 68 y.o. G1P1001 who LMP was in the past, she presents today for her annual examination.  The patient has no complaints today. The patient is sexually active. Herlast pap: approximate date 2017 and was normal and last mammogram: approximate date 2020 and was normal.  The patient does perform self breast exams.  There is no notable family history of breast or ovarian cancer in her family. The patient is not taking hormone replacement therapy. Patient denies post-menopausal vaginal bleeding.   The patient has regular exercise: yes. The patient denies current symptoms of depression.    GYN Hx: Last Colonoscopy:few years ago. Normal.  Last DEXA: never ago.    PMHx: Past Medical History:  Diagnosis Date  . Acid reflux   . Coronary artery disease   . 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 Heart murmur   . History of chicken pox   . Hyperlipidemia   . Hypertension   . Insomnia    Past Surgical History:  Procedure Laterality Date  . APPENDECTOMY    . BREAST EXCISIONAL BIOPSY Right    negative over 5 years ago- neg  . BREAST SURGERY     Biopsy  . COLONOSCOPY WITH PROPOFOL N/A 05/16/2016   Procedure: COLONOSCOPY WITH PROPOFOL;  Surgeon: [name], MD;  Location: [facility];  Service: Endoscopy;  Laterality: N/A;  . CORONARY ARTERY BYPASS GRAFT  05/01/2014   3 vessel, [facility]  . ESOPHAGOGASTRODUODENOSCOPY (EGD) WITH PROPOFOL N/A 02/23/2017   Procedure: ESOPHAGOGASTRODUODENOSCOPY (EGD) WITH PROPOFOL;  Surgeon: [name], MD;  Location: [facility];  Service: Endoscopy;  Laterality: N/A;  . HYSTEROSCOPY W/D&C    . lipoma removed     removed from forehead  . triple bypass     Family History  Problem Relation Age of Onset  . Breast cancer Other        maternal great aunt  . Heart disease Other        multiple family members  . Heart disease Mother   . Stroke Mother   . Alzheimer's disease Mother   . Heart disease  Father   . Stroke Father   . Breast cancer Sister 95  . Alzheimer's disease Maternal Aunt   . Alzheimer's disease Maternal Uncle   . Alzheimer's disease Maternal Grandmother   . Colon cancer Neg Hx   . Diabetes Neg Hx   . Ovarian cancer Neg Hx    Social History   Tobacco Use  . Smoking status: Never Smoker  . Smokeless tobacco: Never Used  Substance Use Topics  . Alcohol use: Never    Alcohol/week: 0.0 standard drinks    Frequency: Never  . Drug use: No    Current Outpatient Medications:  .  Alpha-Lipoic Acid 100 MG CAPS, Take by mouth., Disp: , Rfl:  .  amLODipine (NORVASC) 10 MG tablet, Take 10 mg by mouth daily., Disp: , Rfl: 4 .  aspirin EC 81 MG tablet, Take 81 mg by mouth daily., Disp: , Rfl:  .  atorvastatin (LIPITOR) 20 MG tablet, TAKE 1 TABLET (20 MG TOTAL) BY MOUTH ONCE DAILY., Disp: , Rfl: 5 .  cyanocobalamin (,VITAMIN B-12,) 1000 MCG/ML injection, Inject 1 mL (1,000 mcg total) into the muscle every 30 (thirty) days., Disp: 10 mL, Rfl: 0 .  Docusate Calcium (STOOL SOFTENER PO), Take by mouth., Disp: , Rfl:  .  hydrochlorothiazide (HYDRODIURIL) 25 MG tablet,  TAKE 1 TABLET (25 MG TOTAL) BY MOUTH ONCE DAILY., Disp: , Rfl: 0 .  irbesartan (AVAPRO) 150 MG tablet, Take 150 mg by mouth daily., Disp: , Rfl:  .  loratadine (CLARITIN) 10 MG tablet, TAKE 1 TABLET BY MOUTH EVERY DAY, Disp: 30 tablet, Rfl: 0 .  metoprolol tartrate (LOPRESSOR) 25 MG tablet, Take 25 mg by mouth 2 (two) times daily., Disp: , Rfl: 5 .  Omega-3 Fatty Acids (FISH OIL) 1000 MG CAPS, Take 1,200 mg by mouth daily., Disp: , Rfl:  .  pantoprazole (PROTONIX) 40 MG tablet, TAKE 1 TABLET (40 MG TOTAL) BY MOUTH DAILY. TAKE 30 MINUTES BEFORE BREAKFAST, Disp: 90 tablet, Rfl: 1 .  pregabalin (LYRICA) 75 MG capsule, Take 1-2 tablets in the am and 2 tablets in the pm., Disp: 120 capsule, Rfl: 1 .  Syringe/Needle, Disp, (SYRINGE 3CC/25GX1") 25G X 1" 3 ML MISC, Use as directed with b12 injections., Disp: 50 each, Rfl: 0  .  traZODone (DESYREL) 50 MG tablet, TAKE 1-2 TABLETS (50-100 MG TOTAL) BY MOUTH AT BEDTIME AS NEEDED., Disp: 180 tablet, Rfl: 1 .  triamcinolone cream (KENALOG) 0.1 %, Apply 1 application topically 2 (two) times daily. Use for 7 days., Disp: 30 g, Rfl: 0 .  VITAMIN D, ERGOCALCIFEROL, PO, Take by mouth., Disp: , Rfl:  Allergies: Valsartan  Review of Systems  Constitutional: Negative for chills, fever and malaise/fatigue.  HENT: Negative for congestion, sinus pain and sore throat.   Eyes: Negative for blurred vision and pain.  Respiratory: Negative for cough and wheezing.   Cardiovascular: Negative for chest pain and leg swelling.  Gastrointestinal: Negative for abdominal pain, constipation, diarrhea, heartburn, nausea and vomiting.  Genitourinary: Positive for frequency. Negative for dysuria, hematuria and urgency.  Musculoskeletal: Negative for back pain, joint pain, myalgias and neck pain.  Skin: Negative for itching and rash.  Neurological: Negative for dizziness, tremors and weakness.  Endo/Heme/Allergies: Does not bruise/bleed easily.  Psychiatric/Behavioral: Negative for depression. The patient is not nervous/anxious and does not have insomnia.     Objective: BP 138/80   Ht 5\' 10"  (1.778 m)   Wt 176 lb (79.8 kg)   BMI 25.25 kg/m   Filed Weights   06/27/19 0943  Weight: 176 lb (79.8 kg)   Body mass index is 25.25 kg/m. Physical Exam Constitutional:      General: She is not in acute distress.    Appearance: She is well-developed.  Genitourinary:     Pelvic exam was performed with patient supine.     Vagina, uterus and rectum normal.     No lesions in the vagina.     No vaginal bleeding.     No cervical motion tenderness, friability, lesion or polyp.     Uterus is mobile.     Uterus is not enlarged.     No uterine mass detected.    Uterus is midaxial.     No right or left adnexal mass present.     Right adnexa not tender.     Left adnexa not tender.      Genitourinary Comments: Slight uterine prolapse and mobility  HENT:     Head: Normocephalic and atraumatic. No laceration.     Right Ear: Hearing normal.     Left Ear: Hearing normal.     Mouth/Throat:     Pharynx: Uvula midline.  Eyes:     Pupils: Pupils are equal, round, and reactive to light.  Neck:     Musculoskeletal: Normal range of  motion and neck supple.     Thyroid: No thyromegaly.  Cardiovascular:     Rate and Rhythm: Normal rate and regular rhythm.     Heart sounds: No murmur. No friction rub. No gallop.   Pulmonary:     Effort: Pulmonary effort is normal. No respiratory distress.     Breath sounds: Normal breath sounds. No wheezing.  Chest:     Breasts:        Right: No mass, skin change or tenderness.        Left: No mass, skin change or tenderness.  Abdominal:     General: Bowel sounds are normal. There is no distension.     Palpations: Abdomen is soft.     Tenderness: There is no abdominal tenderness. There is no rebound.  Musculoskeletal: Normal range of motion.  Neurological:     Mental Status: She is alert and oriented to person, place, and time.     Cranial Nerves: No cranial nerve deficit.  Skin:    General: Skin is warm and dry.  Psychiatric:        Judgment: Judgment normal.  Vitals signs reviewed.     Assessment: Annual Exam 1. Women's annual routine gynecological examination   2. Screen for colon cancer   3. Screening for cervical cancer     Plan:            1.  Cervical Screening-  Pap smear done today  2. Breast screening- Exam annually and mammogram scheduled  3. Colonoscopy every 10 years, Hemoccult testing after age 68  4. Labs managed by PCP  5. Counseling for hormonal therapy: none              6. FRAX - FRAX score for assessing the 10 year probability for fracture calculated and discussed today.  Based on age and score today, DEXA is not currently scheduled.    F/U  Return in about 1 year (around 06/26/2020).  Barnett Applebaum,  MD, [facility], [facility] 06/27/2019  10:24 AM

## 2019-06-29 ENCOUNTER — Encounter: Payer: Self-pay | Admitting: Internal Medicine

## 2019-06-29 DIAGNOSIS — Z1211 Encounter for screening for malignant neoplasm of colon: Secondary | ICD-10-CM | POA: Diagnosis not present

## 2019-06-29 NOTE — Assessment & Plan Note (Signed)
Had NCS - polyneuropathy.  Saw neurology.  Discussed increasing lyrica to 150mg  bid.  She wants to continue on 75mg  in am (with option to take 1-2 in am) and two in pm.  rx written.

## 2019-06-29 NOTE — Assessment & Plan Note (Signed)
Controlled on current regimen.   

## 2019-06-29 NOTE — Assessment & Plan Note (Signed)
Doing well on current regimen.  Follow.   

## 2019-06-29 NOTE — Assessment & Plan Note (Signed)
Followed by cardiology.  Overall feels she is doing well.  Continue risk factor modification.

## 2019-06-29 NOTE — Assessment & Plan Note (Signed)
On lipitor.  Low cholesterol diet and exercise.  Follow lipid panel and liver function tests.   

## 2019-06-29 NOTE — Assessment & Plan Note (Signed)
Blood pressure elevated today.  States under good control at home.  Follow pressures.  Continue current medication regimen.  Send in readings.  Make adjustment if persistent elevation.  Follow metabolic panel.

## 2019-06-29 NOTE — Assessment & Plan Note (Signed)
Slight elevation on recent lab.  Recheck calcium, ionized calcium and vitamin D.

## 2019-07-03 LAB — FECAL OCCULT BLOOD, IMMUNOCHEMICAL: Fecal Occult Bld: NEGATIVE

## 2019-07-04 LAB — CYTOLOGY - PAP
Comment: NEGATIVE
Diagnosis: NEGATIVE
High risk HPV: NEGATIVE

## 2019-07-08 ENCOUNTER — Other Ambulatory Visit: Payer: Self-pay

## 2019-07-08 ENCOUNTER — Other Ambulatory Visit (INDEPENDENT_AMBULATORY_CARE_PROVIDER_SITE_OTHER): Payer: PPO

## 2019-07-08 DIAGNOSIS — G629 Polyneuropathy, unspecified: Secondary | ICD-10-CM

## 2019-07-08 LAB — CALCIUM: Calcium: 10.1 mg/dL (ref 8.4–10.5)

## 2019-07-09 ENCOUNTER — Encounter: Payer: Self-pay | Admitting: Obstetrics & Gynecology

## 2019-07-09 LAB — CALCIUM, IONIZED: Calcium, Ion: 5.69 mg/dL — ABNORMAL HIGH (ref 4.8–5.6)

## 2019-07-10 LAB — VITAMIN B12: Vitamin B-12: 412 pg/mL (ref 211–911)

## 2019-07-10 LAB — VITAMIN D 25 HYDROXY (VIT D DEFICIENCY, FRACTURES): VITD: 40.27 ng/mL (ref 30.00–100.00)

## 2019-07-11 ENCOUNTER — Encounter: Payer: Self-pay | Admitting: Internal Medicine

## 2019-07-14 NOTE — Telephone Encounter (Signed)
BP readings for patient. Spoke with her about the rest of this message. Offered virtual appt and advised if she has persistent symptoms she will need to be evaluated. Confirmed doing ok. No symptoms present at this time. Also advised that labs have not yet been resulted because there was something wrong with one of the machines at the lab.

## 2019-07-17 ENCOUNTER — Other Ambulatory Visit: Payer: Self-pay | Admitting: Internal Medicine

## 2019-07-17 DIAGNOSIS — Z20828 Contact with and (suspected) exposure to other viral communicable diseases: Secondary | ICD-10-CM | POA: Diagnosis not present

## 2019-07-17 NOTE — Progress Notes (Signed)
Order placed for f/u labs.  

## 2019-07-18 ENCOUNTER — Other Ambulatory Visit: Payer: Self-pay | Admitting: Family Medicine

## 2019-07-18 DIAGNOSIS — R21 Rash and other nonspecific skin eruption: Secondary | ICD-10-CM

## 2019-07-23 DIAGNOSIS — R208 Other disturbances of skin sensation: Secondary | ICD-10-CM | POA: Diagnosis not present

## 2019-07-24 ENCOUNTER — Other Ambulatory Visit: Payer: Self-pay

## 2019-07-28 ENCOUNTER — Other Ambulatory Visit: Payer: PPO

## 2019-08-05 ENCOUNTER — Other Ambulatory Visit: Payer: Self-pay

## 2019-08-11 ENCOUNTER — Other Ambulatory Visit: Payer: Self-pay

## 2019-08-11 ENCOUNTER — Other Ambulatory Visit (INDEPENDENT_AMBULATORY_CARE_PROVIDER_SITE_OTHER): Payer: PPO

## 2019-08-11 LAB — CALCIUM: Calcium: 10.1 mg/dL (ref 8.4–10.5)

## 2019-08-12 ENCOUNTER — Encounter: Payer: Self-pay | Admitting: Internal Medicine

## 2019-08-12 LAB — PARATHYROID HORMONE, INTACT (NO CA): PTH: 19 pg/mL (ref 14–64)

## 2019-08-13 DIAGNOSIS — I341 Nonrheumatic mitral (valve) prolapse: Secondary | ICD-10-CM | POA: Diagnosis not present

## 2019-08-13 DIAGNOSIS — I25810 Atherosclerosis of coronary artery bypass graft(s) without angina pectoris: Secondary | ICD-10-CM | POA: Diagnosis not present

## 2019-08-13 DIAGNOSIS — R002 Palpitations: Secondary | ICD-10-CM | POA: Diagnosis not present

## 2019-08-13 DIAGNOSIS — I6523 Occlusion and stenosis of bilateral carotid arteries: Secondary | ICD-10-CM | POA: Diagnosis not present

## 2019-08-13 DIAGNOSIS — E782 Mixed hyperlipidemia: Secondary | ICD-10-CM | POA: Diagnosis not present

## 2019-08-13 DIAGNOSIS — I10 Essential (primary) hypertension: Secondary | ICD-10-CM | POA: Diagnosis not present

## 2019-09-18 ENCOUNTER — Other Ambulatory Visit: Payer: Self-pay | Admitting: Internal Medicine

## 2019-10-02 ENCOUNTER — Other Ambulatory Visit: Payer: Self-pay | Admitting: Internal Medicine

## 2019-10-29 ENCOUNTER — Other Ambulatory Visit: Payer: PPO

## 2019-10-30 ENCOUNTER — Ambulatory Visit: Payer: PPO | Admitting: Internal Medicine

## 2019-11-04 ENCOUNTER — Telehealth: Payer: Self-pay | Admitting: *Deleted

## 2019-11-04 DIAGNOSIS — I10 Essential (primary) hypertension: Secondary | ICD-10-CM

## 2019-11-04 DIAGNOSIS — E7800 Pure hypercholesterolemia, unspecified: Secondary | ICD-10-CM

## 2019-11-04 NOTE — Telephone Encounter (Signed)
Please place future orders for lab appt.  

## 2019-11-05 ENCOUNTER — Other Ambulatory Visit: Payer: Self-pay

## 2019-11-05 ENCOUNTER — Other Ambulatory Visit (INDEPENDENT_AMBULATORY_CARE_PROVIDER_SITE_OTHER): Payer: PPO

## 2019-11-05 DIAGNOSIS — I10 Essential (primary) hypertension: Secondary | ICD-10-CM | POA: Diagnosis not present

## 2019-11-05 DIAGNOSIS — E7800 Pure hypercholesterolemia, unspecified: Secondary | ICD-10-CM | POA: Diagnosis not present

## 2019-11-05 LAB — CBC WITH DIFFERENTIAL/PLATELET
Basophils Absolute: 0.1 10*3/uL (ref 0.0–0.1)
Basophils Relative: 1.2 % (ref 0.0–3.0)
Eosinophils Absolute: 0.2 10*3/uL (ref 0.0–0.7)
Eosinophils Relative: 4.1 % (ref 0.0–5.0)
HCT: 39.1 % (ref 36.0–46.0)
Hemoglobin: 13.3 g/dL (ref 12.0–15.0)
Lymphocytes Relative: 41.2 % (ref 12.0–46.0)
Lymphs Abs: 2.1 10*3/uL (ref 0.7–4.0)
MCHC: 33.9 g/dL (ref 30.0–36.0)
MCV: 90.8 fl (ref 78.0–100.0)
Monocytes Absolute: 0.5 10*3/uL (ref 0.1–1.0)
Monocytes Relative: 10.6 % (ref 3.0–12.0)
Neutro Abs: 2.1 10*3/uL (ref 1.4–7.7)
Neutrophils Relative %: 42.9 % — ABNORMAL LOW (ref 43.0–77.0)
Platelets: 150 10*3/uL (ref 150.0–400.0)
RBC: 4.31 Mil/uL (ref 3.87–5.11)
RDW: 12.8 % (ref 11.5–15.5)
WBC: 5 10*3/uL (ref 4.0–10.5)

## 2019-11-05 LAB — BASIC METABOLIC PANEL
BUN: 11 mg/dL (ref 6–23)
CO2: 31 mEq/L (ref 19–32)
Calcium: 10.4 mg/dL (ref 8.4–10.5)
Chloride: 104 mEq/L (ref 96–112)
Creatinine, Ser: 0.82 mg/dL (ref 0.40–1.20)
GFR: 69.23 mL/min (ref >60.00)
Glucose, Bld: 92 mg/dL (ref 70–99)
Potassium: 3.5 mEq/L (ref 3.5–5.1)
Sodium: 141 mEq/L (ref 135–145)

## 2019-11-05 LAB — HEPATIC FUNCTION PANEL
ALT: 17 U/L (ref 0–35)
AST: 17 U/L (ref 0–37)
Albumin: 4.2 g/dL (ref 3.5–5.2)
Alkaline Phosphatase: 38 U/L — ABNORMAL LOW (ref 39–117)
Bilirubin, Direct: 0.1 mg/dL (ref 0.0–0.3)
Total Bilirubin: 0.6 mg/dL (ref 0.2–1.2)
Total Protein: 6.6 g/dL (ref 6.0–8.3)

## 2019-11-05 LAB — LIPID PANEL
Cholesterol: 109 mg/dL (ref 0–200)
HDL: 32.6 mg/dL — ABNORMAL LOW (ref >39.00)
LDL Cholesterol: 59 mg/dL (ref 0–99)
NonHDL: 76.29
Total CHOL/HDL Ratio: 3
Triglycerides: 88 mg/dL (ref 0.0–149.0)
VLDL: 17.6 mg/dL (ref 0.0–40.0)

## 2019-11-05 NOTE — Telephone Encounter (Signed)
Order placed for f/u labs.  

## 2019-11-06 ENCOUNTER — Telehealth (INDEPENDENT_AMBULATORY_CARE_PROVIDER_SITE_OTHER): Payer: PPO | Admitting: Internal Medicine

## 2019-11-06 ENCOUNTER — Encounter: Payer: Self-pay | Admitting: Internal Medicine

## 2019-11-06 DIAGNOSIS — G4709 Other insomnia: Secondary | ICD-10-CM | POA: Diagnosis not present

## 2019-11-06 DIAGNOSIS — R21 Rash and other nonspecific skin eruption: Secondary | ICD-10-CM | POA: Diagnosis not present

## 2019-11-06 DIAGNOSIS — F419 Anxiety disorder, unspecified: Secondary | ICD-10-CM

## 2019-11-06 DIAGNOSIS — G629 Polyneuropathy, unspecified: Secondary | ICD-10-CM | POA: Diagnosis not present

## 2019-11-06 DIAGNOSIS — I2510 Atherosclerotic heart disease of native coronary artery without angina pectoris: Secondary | ICD-10-CM | POA: Diagnosis not present

## 2019-11-06 DIAGNOSIS — I10 Essential (primary) hypertension: Secondary | ICD-10-CM | POA: Diagnosis not present

## 2019-11-06 DIAGNOSIS — E7800 Pure hypercholesterolemia, unspecified: Secondary | ICD-10-CM

## 2019-11-06 NOTE — Progress Notes (Signed)
[name], female   DOB: February 22, 1951, 69 y.o.   MRN: [id number]   Virtual Visit via video Note  This visit type was conducted due to national recommendations for restrictions regarding the COVID-19 pandemic (e.g. social distancing).  This format is felt to be most appropriate for this patient at this time.  All issues noted in this document were discussed and addressed.  No physical exam was performed (except for noted visual exam findings with Video Visits).   I connected with Alexandra Mendoza by a video enabled telemedicine application and verified that I am speaking with the correct person using two identifiers. Location patient: home Location provider: work or home office Persons participating in the virtual visit: patient, provider  The limitations, risks, security and privacy concerns of performing an evaluation and management service by video and the availability of in person appointments have been discussed. The patient expressed understanding and agreed to proceed.   Reason for visit: scheduled follow up.   HPI: Saw cardiology 08/2019.  Stable.  On lipitor.  Saw Dr Kenton Kingfisher 06/2019 - PAP negative with negative HPV.  Followed by Dr Manuella Ghazi for her neuropathy.  On lyrica.  Discussed dosing.  Plans to take 1 in am and 2 in pm.  Will follow for side effects.  No chest pain.  Breathing stable.  No abdominal pain.  Bowels moving.  Persistent rash - rash on chest.  States had previously when on ARB.  Saw dermatology and was told related to ARB.  Has been on ARB now for a while.  Just developed this rash 2-2.5 weeks ago.  No itching.  Localized to chest and upper arms.  Blood pressures averaging 110-135/50-60.  Handling stress.     ROS: See pertinent positives and negatives per HPI.  Past Medical History:  Diagnosis Date  . Acid reflux   . Coronary artery disease   . 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 Heart murmur   . History of chicken pox   . Hyperlipidemia   .  Hypertension   . Insomnia     Past Surgical History:  Procedure Laterality Date  . APPENDECTOMY    . BREAST EXCISIONAL BIOPSY Right    negative over 5 years ago- neg  . BREAST SURGERY     Biopsy  . COLONOSCOPY WITH PROPOFOL N/A 05/16/2016   Procedure: COLONOSCOPY WITH PROPOFOL;  Surgeon: [name], MD;  Location: [facility];  Service: Endoscopy;  Laterality: N/A;  . CORONARY ARTERY BYPASS GRAFT  05/01/2014   3 vessel, [facility]  . ESOPHAGOGASTRODUODENOSCOPY (EGD) WITH PROPOFOL N/A 02/23/2017   Procedure: ESOPHAGOGASTRODUODENOSCOPY (EGD) WITH PROPOFOL;  Surgeon: [name], MD;  Location: [place];  Service: Endoscopy;  Laterality: N/A;  . HYSTEROSCOPY WITH D & C    . lipoma removed     removed from forehead  . triple bypass      Family History  Problem Relation Age of Onset  . Breast cancer Other        maternal great aunt  . Heart disease Other        multiple family members  . Heart disease Mother   . Stroke Mother   . Alzheimer's disease Mother   . Heart disease Father   . Stroke Father   . Breast cancer Sister 22  . Alzheimer's disease Maternal Aunt   . Alzheimer's disease Maternal Uncle   . Alzheimer's disease Maternal Grandmother   . Colon cancer  Neg Hx   . Diabetes Neg Hx   . Ovarian cancer Neg Hx     SOCIAL HX: reviewed.     Current Outpatient Medications:  .  Alpha-Lipoic Acid 100 MG CAPS, Take by mouth., Disp: , Rfl:  .  amLODipine (NORVASC) 10 MG tablet, Take 10 mg by mouth daily., Disp: , Rfl: 4 .  ascorbic acid (VITAMIN C) 1000 MG tablet, Take by mouth., Disp: , Rfl:  .  aspirin EC 81 MG tablet, Take 81 mg by mouth daily., Disp: , Rfl:  .  atorvastatin (LIPITOR) 20 MG tablet, TAKE 1 TABLET (20 MG TOTAL) BY MOUTH ONCE DAILY., Disp: , Rfl: 5 .  calcium-vitamin D (OSCAL WITH D) 500-200 MG-UNIT TABS tablet, Take by mouth., Disp: , Rfl:  .  cyanocobalamin (,VITAMIN B-12,) 1000 MCG/ML injection, INJECT 1 ML (1,000 MCG TOTAL) INTO  THE MUSCLE EVERY 30 (THIRTY) DAYS., Disp: 10 mL, Rfl: 0 .  Docusate Calcium (STOOL SOFTENER PO), Take by mouth., Disp: , Rfl:  .  hydrochlorothiazide (HYDRODIURIL) 25 MG tablet, TAKE 1 TABLET (25 MG TOTAL) BY MOUTH ONCE DAILY., Disp: , Rfl: 0 .  irbesartan (AVAPRO) 150 MG tablet, Take 150 mg by mouth daily., Disp: , Rfl:  .  loratadine (CLARITIN) 10 MG tablet, TAKE 1 TABLET BY MOUTH EVERY DAY, Disp: 30 tablet, Rfl: 0 .  metoprolol tartrate (LOPRESSOR) 25 MG tablet, Take 25 mg by mouth 2 (two) times daily., Disp: , Rfl: 5 .  Omega-3 Fatty Acids (FISH OIL) 1000 MG CAPS, Take 1,200 mg by mouth daily., Disp: , Rfl:  .  pantoprazole (PROTONIX) 40 MG tablet, TAKE 1 TABLET (40 MG TOTAL) BY MOUTH DAILY. TAKE 30 MINUTES BEFORE BREAKFAST, Disp: 90 tablet, Rfl: 1 .  pregabalin (LYRICA) 75 MG capsule, Take 1-2 tablets in the am and 2 tablets in the pm., Disp: 120 capsule, Rfl: 1 .  Syringe/Needle, Disp, (SYRINGE 3CC/25GX1") 25G X 1" 3 ML MISC, Use as directed with b12 injections., Disp: 50 each, Rfl: 0 .  traZODone (DESYREL) 50 MG tablet, TAKE 1-2 TABLETS (50-100 MG TOTAL) BY MOUTH AT BEDTIME AS NEEDED., Disp: 180 tablet, Rfl: 1 .  triamcinolone cream (KENALOG) 0.1 %, Apply 1 application topically 2 (two) times daily. Use for 7 days., Disp: 30 g, Rfl: 0  EXAM:  VITALS per patient if applicable:110/50-60  GENERAL: alert, oriented, appears well and in no acute distress  HEENT: atraumatic, conjunttiva clear, no obvious abnormalities on inspection of external nose and ears  NECK: normal movements of the head and neck  LUNGS: on inspection no signs of respiratory distress, breathing rate appears normal, no obvious gross SOB, gasping or wheezing  CV: no obvious cyanosis  PSYCH/NEURO: pleasant and cooperative, no obvious depression or anxiety, speech and thought processing grossly intact  ASSESSMENT AND PLAN:  Discussed the following assessment and plan:  Anxiety Taking trazodone at night.  Overall  appears to be stable.  Follow.    CAD (coronary artery disease) Followed by cardiology.  Continue risk factor modification.  No chest pain.  Follow.    Essential hypertension Blood pressure as outlined.  Continue amlodipine, hctz and avapro for now.  Will have dermatology reevaluate to see if concern regarding allergy to ARB.  Follow metabolic panel.  Follow pressures.    Hypercholesterolemia On lipitor.  Low cholesterol diet and exercise.  Follow lipid panel and liver function tests.    Insomnia Continue trazodone.  Follow.   Neuropathy Had NCS - polyneuropathy.  Saw neurology.  On lyrica.  Plans to start 50mg  in am and 75mg  q hs.  Follow.    Rash Previous rash felt to be related to ARB.  Was started back on ARB and has been on for a while.  Rash localized to chest and upper arms.  Will have dermatology reevaluate to confirm etiology of rash.  Would like to confirm if related to ARB before stopping, especially given her history of hypertension and CAD.  She will contact dermatology.     Orders Placed This Encounter  Procedures  . TSH    Standing Status:   Future    Standing Expiration Date:   11/08/2020  . Lipid panel    Standing Status:   Future    Standing Expiration Date:   11/08/2020  . Hepatic function panel    Standing Status:   Future    Standing Expiration Date:   11/08/2020  . Vitamin B12    Standing Status:   Future    Standing Expiration Date:   11/08/2020  . Basic metabolic panel    Standing Status:   Future    Standing Expiration Date:   11/08/2020     I discussed the assessment and treatment plan with the patient. The patient was provided an opportunity to ask questions and all were answered. The patient agreed with the plan and demonstrated an understanding of the instructions.   The patient was advised to call back or seek an in-person evaluation if the symptoms worsen or if the condition fails to improve as anticipated.   Einar Pheasant, MD

## 2019-11-09 ENCOUNTER — Encounter: Payer: Self-pay | Admitting: Internal Medicine

## 2019-11-09 DIAGNOSIS — R21 Rash and other nonspecific skin eruption: Secondary | ICD-10-CM | POA: Insufficient documentation

## 2019-11-09 NOTE — Assessment & Plan Note (Signed)
Continue trazodone. Follow.   

## 2019-11-09 NOTE — Assessment & Plan Note (Signed)
Previous rash felt to be related to ARB.  Was started back on ARB and has been on for a while.  Rash localized to chest and upper arms.  Will have dermatology reevaluate to confirm etiology of rash.  Would like to confirm if related to ARB before stopping, especially given her history of hypertension and CAD.  She will contact dermatology.

## 2019-11-09 NOTE — Assessment & Plan Note (Signed)
Taking trazodone at night.  Overall appears to be stable.  Follow.

## 2019-11-09 NOTE — Assessment & Plan Note (Signed)
Blood pressure as outlined.  Continue amlodipine, hctz and avapro for now.  Will have dermatology reevaluate to see if concern regarding allergy to ARB.  Follow metabolic panel.  Follow pressures.

## 2019-11-09 NOTE — Assessment & Plan Note (Signed)
Had NCS - polyneuropathy.  Saw neurology.  On lyrica.  Plans to start 50mg  in am and 75mg  q hs.  Follow.

## 2019-11-09 NOTE — Assessment & Plan Note (Signed)
On lipitor.  Low cholesterol diet and exercise.  Follow lipid panel and liver function tests.   

## 2019-11-09 NOTE — Assessment & Plan Note (Signed)
Followed by cardiology.  Continue risk factor modification.  No chest pain.  Follow.

## 2019-11-10 DIAGNOSIS — L309 Dermatitis, unspecified: Secondary | ICD-10-CM | POA: Diagnosis not present

## 2019-11-10 DIAGNOSIS — L821 Other seborrheic keratosis: Secondary | ICD-10-CM | POA: Diagnosis not present

## 2019-11-20 ENCOUNTER — Other Ambulatory Visit: Payer: Self-pay | Admitting: Internal Medicine

## 2019-11-20 DIAGNOSIS — M4807 Spinal stenosis, lumbosacral region: Secondary | ICD-10-CM | POA: Diagnosis not present

## 2019-11-20 DIAGNOSIS — G2581 Restless legs syndrome: Secondary | ICD-10-CM | POA: Diagnosis not present

## 2019-11-20 DIAGNOSIS — G608 Other hereditary and idiopathic neuropathies: Secondary | ICD-10-CM | POA: Diagnosis not present

## 2019-11-20 DIAGNOSIS — G939 Disorder of brain, unspecified: Secondary | ICD-10-CM | POA: Diagnosis not present

## 2019-11-20 DIAGNOSIS — G44099 Other trigeminal autonomic cephalgias (TAC), not intractable: Secondary | ICD-10-CM | POA: Diagnosis not present

## 2019-11-24 ENCOUNTER — Encounter: Payer: Self-pay | Admitting: Internal Medicine

## 2019-11-26 ENCOUNTER — Emergency Department
Admission: EM | Admit: 2019-11-26 | Discharge: 2019-11-27 | Disposition: A | Discharge status: Home / Self Care | Payer: PPO | Attending: Emergency Medicine | Admitting: Emergency Medicine

## 2019-11-26 ENCOUNTER — Emergency Department: Payer: PPO

## 2019-11-26 ENCOUNTER — Other Ambulatory Visit: Payer: Self-pay

## 2019-11-26 ENCOUNTER — Encounter: Payer: Self-pay | Admitting: *Deleted

## 2019-11-26 DIAGNOSIS — D123 Benign neoplasm of transverse colon: Secondary | ICD-10-CM | POA: Diagnosis not present

## 2019-11-26 DIAGNOSIS — R000 Tachycardia, unspecified: Secondary | ICD-10-CM | POA: Diagnosis not present

## 2019-11-26 DIAGNOSIS — Z733 Stress, not elsewhere classified: Secondary | ICD-10-CM | POA: Diagnosis not present

## 2019-11-26 DIAGNOSIS — I10 Essential (primary) hypertension: Secondary | ICD-10-CM

## 2019-11-26 DIAGNOSIS — R079 Chest pain, unspecified: Secondary | ICD-10-CM | POA: Diagnosis not present

## 2019-11-26 DIAGNOSIS — Z951 Presence of aortocoronary bypass graft: Secondary | ICD-10-CM | POA: Insufficient documentation

## 2019-11-26 DIAGNOSIS — Z7982 Long term (current) use of aspirin: Secondary | ICD-10-CM | POA: Insufficient documentation

## 2019-11-26 DIAGNOSIS — I2510 Atherosclerotic heart disease of native coronary artery without angina pectoris: Secondary | ICD-10-CM | POA: Diagnosis not present

## 2019-11-26 DIAGNOSIS — Z85828 Personal history of other malignant neoplasm of skin: Secondary | ICD-10-CM | POA: Diagnosis not present

## 2019-11-26 DIAGNOSIS — D3500 Benign neoplasm of unspecified adrenal gland: Secondary | ICD-10-CM | POA: Insufficient documentation

## 2019-11-26 DIAGNOSIS — Z79899 Other long term (current) drug therapy: Secondary | ICD-10-CM | POA: Insufficient documentation

## 2019-11-26 LAB — CBC
HCT: 41.4 % (ref 36.0–46.0)
Hemoglobin: 14 g/dL (ref 12.0–15.0)
MCH: 30.4 pg (ref 26.0–34.0)
MCHC: 33.8 g/dL (ref 30.0–36.0)
MCV: 90 fL (ref 80.0–100.0)
Platelets: 183 10*3/uL (ref 150–400)
RBC: 4.6 MIL/uL (ref 3.87–5.11)
RDW: 11.9 % (ref 11.5–15.5)
WBC: 8.4 10*3/uL (ref 4.0–10.5)
nRBC: 0 % (ref 0.0–0.2)

## 2019-11-26 LAB — BASIC METABOLIC PANEL
Anion gap: 9 (ref 5–15)
BUN: 16 mg/dL (ref 8–23)
CO2: 27 mmol/L (ref 22–32)
Calcium: 10.6 mg/dL — ABNORMAL HIGH (ref 8.9–10.3)
Chloride: 103 mmol/L (ref 98–111)
Creatinine, Ser: 0.74 mg/dL (ref 0.44–1.00)
GFR calc Af Amer: >60 mL/min (ref >60)
GFR calc non Af Amer: >60 mL/min (ref >60)
Glucose, Bld: 112 mg/dL — ABNORMAL HIGH (ref 70–99)
Potassium: 3.3 mmol/L — ABNORMAL LOW (ref 3.5–5.1)
Sodium: 139 mmol/L (ref 135–145)

## 2019-11-26 LAB — TROPONIN I (HIGH SENSITIVITY)
Troponin I (High Sensitivity): 3 ng/L (ref <18)
Troponin I (High Sensitivity): 5 ng/L (ref <18)

## 2019-11-26 NOTE — ED Triage Notes (Signed)
Pt to ED reporting having checked her BO at home manually and noticed it was elevated. Pt denies chest pain SOB or any symptoms but is concerned that her BP is elevated.   Hx of triple bypass

## 2019-11-26 NOTE — ED Provider Notes (Signed)
-----------------------------------------   11:42 PM on 11/26/2019 -----------------------------------------   Blood pressure (!) 176/81, pulse 67, temperature 98.3 F (36.8 C), resp. rate 17, SpO2 99 %.  Assuming care from Dr. Joni Fears of [facility] is a 69 y.o. female with a chief complaint of Hypertension .    Please refer to H&P by previous MD for further details.  The current plan of care is to f/u repeat troponin.   Repeat troponin remains negative and patient remains asymptomatic. Plan to dc home, BP diary, and f/u with cardiology in already scheduled appointment in 1 week. Discussed return precautions for BP > 190/110 or any BP with HA, CP, SOB, dizziness. Patient comfortable with plan.         Alfred Levins, [place], MD 11/26/19 [phone]

## 2019-11-26 NOTE — Discharge Instructions (Signed)
Please keep a record of your blood pressure measurements every morning, noon time, and evening for the next week until you see Dr. Nehemiah Massed.  Continue taking all of your medications as prescribed.

## 2019-11-26 NOTE — ED Provider Notes (Signed)
[facility] Emergency Department Provider Note  ____________________________________________  Time seen: Approximately 10:39 PM  I have reviewed the triage vital signs and the nursing notes.   HISTORY  Chief Complaint Hypertension    HPI Alexandra Mendoza is a 69 y.o. female with a history of CAD, hypertension, hyperlipidemia, anxiety who comes the ED complaining of elevated blood pressure at home over the last week.  Gradual onset.  She reports that her blood pressure measurements in the morning are normal, but when she checks it in the evening, sometimes it is elevated at [id number] or 0000000 systolic, and this evening she noticed that it was elevated to about 190/90.  Elevated pressure appears to be intermittent, no identifiable aggravating or alleviating factors  Denies any chest pain shortness of breath back pain abdominal pain headaches vision changes paresthesias weakness palpitations or other acute symptoms.  No fevers chills flushing or sweating spells.  She does note that she has been extra stressed recently due to her grandchild undergoing brain surgery.  She notes that the procedure is been completed, went well, and she is feeling relieved at this point.  Only medication changes have been increase of her Lyrica a month ago from 75 mg twice daily to 75 mg every morning and 150 mg every evening.  She also started on ropinirole about 4 days ago but she feels that her elevated blood pressures have preceded that.      Past Medical History:  Diagnosis Date  . Acid reflux   . Coronary artery disease   . 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 Heart murmur   . History of chicken pox   . Hyperlipidemia   . Hypertension   . Insomnia      Patient Active Problem List   Diagnosis Date Noted  . Rash 11/09/2019  . Hypercalcemia 06/29/2019  . Neuropathy 02/15/2018  . Vaginal atrophy 06/12/2017  . Menopause 06/12/2017  . Osteopenia 06/12/2017  . Heartburn    . Chest pain 01/08/2017  . Palpitations 11/28/2016  . Headache 07/07/2016  . Special screening for malignant neoplasms, colon   . Benign neoplasm of transverse colon   . Family history of breast cancer in first degree relative 10/25/2015  . Fibrocystic breast changes of both breasts 10/25/2015  . Abdominal discomfort 08/22/2015  . Abnormal vaginal Pap smear 05/18/2015  . Adaptation reaction 05/05/2015  . Anxiety 05/05/2015  . History of chicken pox 05/05/2015  . HLD (hyperlipidemia) 05/05/2015  . 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 Osteoarthrosis 05/05/2015  . Adiposity 05/05/2015  . H/O coronary artery bypass surgery 05/05/2015  . CAD (coronary artery disease) 03/14/2015  . Essential hypertension 03/14/2015  . Hypercholesterolemia 03/14/2015  . Health care maintenance 03/14/2015  . Insomnia 03/08/2015  . Esophagitis, reflux 12/11/2014  . History of atrial fibrillation 06/29/2014  . Arteriosclerosis of bypass graft of coronary artery 06/01/2014  . Billowing mitral valve 06/01/2014  . 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 APPENDECTOMY    . BREAST EXCISIONAL BIOPSY Right    negative over 5 years ago- neg  . BREAST SURGERY     Biopsy  . COLONOSCOPY WITH PROPOFOL N/A 05/16/2016   Procedure: COLONOSCOPY WITH PROPOFOL;  Surgeon: [name], MD;  Location: [facility] ENDOSCOPY;  Service: Endoscopy;  Laterality: N/A;  . CORONARY ARTERY BYPASS GRAFT  05/01/2014   3 vessel[facility]  . ESOPHAGOGASTRODUODENOSCOPY (EGD) WITH PROPOFOL N/A 02/23/2017   Procedure: ESOPHAGOGASTRODUODENOSCOPY (EGD) WITH PROPOFOL;  Surgeon: [name], MD;  Location: [place];  Service: Endoscopy;  Laterality: N/A;  . HYSTEROSCOPY WITH D & C    . lipoma removed     removed from forehead  . triple bypass       Prior to Admission medications   Medication Sig Start Date End Date  Taking? Authorizing Provider  Alpha-Lipoic Acid 100 MG CAPS Take by mouth.    [provider]  amLODipine (NORVASC) 10 MG tablet Take 10 mg by mouth daily. 03/08/15   [provider]  ascorbic acid (VITAMIN C) 1000 MG tablet Take by mouth.    [provider]  aspirin EC 81 MG tablet Take 81 mg by mouth daily. 10/04/12   [provider]  atorvastatin (LIPITOR) 20 MG tablet TAKE 1 TABLET (20 MG TOTAL) BY MOUTH ONCE DAILY. 11/22/15   [provider]  calcium-vitamin D (OSCAL WITH D) 500-200 MG-UNIT TABS tablet Take by mouth.    [provider]  cyanocobalamin (,VITAMIN B-12,) 1000 MCG/ML injection INJECT 1 ML (1,000 MCG TOTAL) INTO THE MUSCLE EVERY 30 (THIRTY) DAYS. 10/02/19   Einar Pheasant, MD  Docusate Calcium (STOOL SOFTENER PO) Take by mouth.    [provider]  hydrochlorothiazide (HYDRODIURIL) 25 MG tablet TAKE 1 TABLET (25 MG TOTAL) BY MOUTH ONCE DAILY. 03/15/16   [provider]  irbesartan (AVAPRO) 150 MG tablet Take 150 mg by mouth daily. 11/06/18   [provider]  loratadine (CLARITIN) 10 MG tablet TAKE 1 TABLET BY MOUTH EVERY DAY 07/18/19   Guse, Jacquelynn Cree, FNP  metoprolol tartrate (LOPRESSOR) 25 MG tablet Take 25 mg by mouth 2 (two) times daily. 02/05/15   [provider]  Omega-3 Fatty Acids (FISH OIL) 1000 MG CAPS Take 1,200 mg by mouth daily.    [provider]  pantoprazole (PROTONIX) 40 MG tablet TAKE 1 TABLET (40 MG TOTAL) BY MOUTH DAILY. TAKE 30 MINUTES BEFORE BREAKFAST 09/18/19   Einar Pheasant, MD  pregabalin (LYRICA) 75 MG capsule Take 1-2 tablets in the am and 2 tablets in the pm. 06/26/19   Einar Pheasant, MD  Syringe/Needle, Disp, (SYRINGE 3CC/25GX1") 25G X 1" 3 ML MISC Use as directed with b12 injections. 12/19/18   Einar Pheasant, MD  traZODone (DESYREL) 50 MG tablet TAKE 1-2 TABLETS (50-100 MG TOTAL) BY MOUTH AT BEDTIME AS NEEDED. 11/21/19   Einar Pheasant, MD  triamcinolone cream  (KENALOG) 0.1 % Apply 1 application topically 2 (two) times daily. Use for 7 days. 08/20/18   Jodelle Green, FNP     Allergies Valsartan   Family History  Problem Relation Age of Onset  . Breast cancer Other        maternal great aunt  . Heart disease Other        multiple family members  . Heart disease Mother   . Stroke Mother   . Alzheimer's disease Mother   . Heart disease Father   . Stroke Father   . Breast cancer Sister 38  . Alzheimer's disease Maternal Aunt   . Alzheimer's disease Maternal Uncle   . Alzheimer's disease Maternal Grandmother   . Colon cancer Neg Hx   . Diabetes Neg Hx   . Ovarian cancer Neg Hx     Social History Social History   Tobacco Use  . Smoking status: Never Smoker  . Smokeless tobacco: Never Used  Substance Use Topics  . Alcohol use: Never    Alcohol/week: 0.0 standard drinks  . Drug use: No    Review of Systems  Constitutional:   No fever or chills.  ENT:   No sore throat. No rhinorrhea. Cardiovascular:   No chest pain or syncope. Respiratory:   No dyspnea or cough. Gastrointestinal:   Negative for abdominal pain, vomiting and diarrhea.  Musculoskeletal:   Negative for focal pain or swelling All other systems reviewed and are negative except as documented above in ROS and HPI.  ____________________________________________   PHYSICAL EXAM:  VITAL SIGNS: ED Triage Vitals  Enc Vitals Group     BP 11/26/19 2047 (!) 193/81     Pulse Rate 11/26/19 2047 (!) 115     Resp 11/26/19 2047 16     Temp 11/26/19 2047 98.3 F (36.8 C)     Temp src --      SpO2 11/26/19 2047 99 %     Weight --      Height --      Head Circumference --      Peak Flow --      Pain Score 11/26/19 2049 0     Pain Loc --      Pain Edu? --      Excl. in [place]? --     Vital signs reviewed, nursing assessments reviewed.   Constitutional:   Alert and oriented. Non-toxic appearance. Eyes:   Conjunctivae are normal. EOMI.  ENT      Head:    Normocephalic and atraumatic.      Nose:   Wearing a mask.      Mouth/Throat:   Wearing a mask.      Neck:   No meningismus. Full ROM.  Thyroid nonpalpable Hematological/Lymphatic/Immunilogical:   No cervical lymphadenopathy. Cardiovascular:   RRR, heart rate 80. Symmetric bilateral radial there is a systolic murmur. Cap refill less than 2 seconds. Respiratory:   Normal respiratory effort without tachypnea/retractions. Breath sounds are clear and equal bilaterally. No wheezes/rales/rhonchi. Gastrointestinal:   Soft and nontender. Non distended. There is no CVA tenderness.  No rebound, rigidity, or guarding. Musculoskeletal:   Normal range of motion in all extremities. No joint effusions.  No lower extremity tenderness.  No edema. Neurologic:   Normal speech and language.  Motor grossly intact. No acute focal neurologic deficits are appreciated.    ____________________________________________    LABS (pertinent positives/negatives) (all labs ordered are listed, but only abnormal results are displayed) Labs Reviewed  BASIC METABOLIC PANEL - Abnormal; Notable for the following components:      Result Value   Potassium 3.3 (*)    Glucose, Bld 112 (*)    Calcium 10.6 (*)    All other components within normal limits  CBC  TROPONIN I (HIGH SENSITIVITY)  TROPONIN I (HIGH SENSITIVITY)   ____________________________________________   EKG  Interpreted by me Sinus tachycardia rate 109.  Normal axis and intervals, normal QRS ST segments and T waves.  No ischemic changes.  ____________________________________________    RADIOLOGY  DG Chest 2 View  Result Date: 11/26/2019 CLINICAL DATA:  Hypertension and chest pain EXAM: CHEST - 2 VIEW COMPARISON:  None. FINDINGS: The heart size and mediastinal contours are within normal limits. Both lungs are clear. The visualized skeletal structures are unremarkable. Remote median sternotomy. IMPRESSION: No active cardiopulmonary disease.  Electronically Signed   By: Ulyses Jarred M.D.   On: 11/26/2019 21:21    ____________________________________________   PROCEDURES Procedures  ____________________________________________    CLINICAL IMPRESSION / ASSESSMENT AND PLAN / ED COURSE  Medications ordered in the ED: Medications - No data to display  Pertinent labs & imaging results that were available during my care of the patient were reviewed by me and considered in my medical decision making (see chart for details).  Etana ROLANDA VINZANT was evaluated in Emergency Department on 11/26/2019 for the symptoms described in the history of present illness. She was evaluated in the context of the global COVID-19 pandemic, which necessitated consideration that the patient might be at risk for infection with the SARS-CoV-2 virus that causes COVID-19. Institutional protocols and algorithms that pertain to the evaluation of patients at risk for COVID-19 are in a state of rapid change based on information released by regulatory bodies including the CDC and federal and state organizations. These policies and algorithms were followed during the patient's care in the ED.   Patient presents with elevated home blood pressure readings.  In triage she was found to be tachycardic with a blood pressure of about 190/90.  She does note that she also has whitecoat syndrome and was feeling nervous in triage.  On my exam, heart rate is 80.    Chest x-ray and labs including troponin were obtained by triage protocol, normal.  High-sensitivity troponin is 3.  Will repeat blood pressure, check a second troponin.  ----------------------------------------- 10:44 PM on 11/26/2019 -----------------------------------------  Repeat vitals show heart rate in the 60s.  Blood pressure 176/81.  Anticipate patient will be stable for discharge home after second troponin results if it is not significantly elevated.  Given that her blood pressure abnormalities seem to be  intermittent, I will have her keep a record of blood pressure measurements throughout the day in advance of her cardiology follow-up appointment that is in 1 week, defer on any medication changes at this time.      ____________________________________________   FINAL CLINICAL IMPRESSION(S) / ED DIAGNOSES    Final diagnoses:  Hypertension, unspecified type     ED Discharge Orders    None      Portions of this note were generated with dragon dictation software. Dictation errors may occur despite best attempts at proofreading.   Carrie Mew, MD 11/26/19 2245

## 2019-11-28 ENCOUNTER — Other Ambulatory Visit: Payer: Self-pay | Admitting: Internal Medicine

## 2019-11-28 DIAGNOSIS — E782 Mixed hyperlipidemia: Secondary | ICD-10-CM | POA: Diagnosis not present

## 2019-11-28 DIAGNOSIS — I6523 Occlusion and stenosis of bilateral carotid arteries: Secondary | ICD-10-CM | POA: Diagnosis not present

## 2019-11-28 DIAGNOSIS — I10 Essential (primary) hypertension: Secondary | ICD-10-CM | POA: Diagnosis not present

## 2019-11-28 DIAGNOSIS — I25810 Atherosclerosis of coronary artery bypass graft(s) without angina pectoris: Secondary | ICD-10-CM | POA: Diagnosis not present

## 2019-11-30 ENCOUNTER — Encounter: Payer: Self-pay | Admitting: Internal Medicine

## 2019-11-30 NOTE — Telephone Encounter (Signed)
rx sent in for lyrica #120 with one refill.

## 2020-01-05 DIAGNOSIS — I10 Essential (primary) hypertension: Secondary | ICD-10-CM | POA: Diagnosis not present

## 2020-01-05 DIAGNOSIS — I6523 Occlusion and stenosis of bilateral carotid arteries: Secondary | ICD-10-CM | POA: Diagnosis not present

## 2020-01-05 DIAGNOSIS — I25810 Atherosclerosis of coronary artery bypass graft(s) without angina pectoris: Secondary | ICD-10-CM | POA: Diagnosis not present

## 2020-01-20 ENCOUNTER — Other Ambulatory Visit: Payer: Self-pay | Admitting: Internal Medicine

## 2020-01-29 DIAGNOSIS — H43813 Vitreous degeneration, bilateral: Secondary | ICD-10-CM | POA: Diagnosis not present

## 2020-02-11 DIAGNOSIS — I10 Essential (primary) hypertension: Secondary | ICD-10-CM | POA: Diagnosis not present

## 2020-02-11 DIAGNOSIS — E782 Mixed hyperlipidemia: Secondary | ICD-10-CM | POA: Diagnosis not present

## 2020-02-11 DIAGNOSIS — I25810 Atherosclerosis of coronary artery bypass graft(s) without angina pectoris: Secondary | ICD-10-CM | POA: Diagnosis not present

## 2020-02-11 DIAGNOSIS — I6523 Occlusion and stenosis of bilateral carotid arteries: Secondary | ICD-10-CM | POA: Diagnosis not present

## 2020-02-24 ENCOUNTER — Encounter: Payer: Self-pay | Admitting: Internal Medicine

## 2020-02-26 ENCOUNTER — Telehealth: Payer: Self-pay | Admitting: Internal Medicine

## 2020-02-26 MED ORDER — PREGABALIN 75 MG PO CAPS: ORAL_CAPSULE | ORAL | 1 refills | Status: DC

## 2020-02-26 NOTE — Telephone Encounter (Signed)
rx sent to [facility].  Pt notified via my chart.

## 2020-02-26 NOTE — Telephone Encounter (Signed)
Last refilled 11/2019.  Sees PCP end of this month

## 2020-02-26 NOTE — Telephone Encounter (Signed)
Pt notified rx for lyrica sent in to pharmacy.

## 2020-03-09 ENCOUNTER — Other Ambulatory Visit: Payer: Self-pay

## 2020-03-09 ENCOUNTER — Other Ambulatory Visit (INDEPENDENT_AMBULATORY_CARE_PROVIDER_SITE_OTHER): Payer: PPO

## 2020-03-09 DIAGNOSIS — I10 Essential (primary) hypertension: Secondary | ICD-10-CM | POA: Diagnosis not present

## 2020-03-09 DIAGNOSIS — E7800 Pure hypercholesterolemia, unspecified: Secondary | ICD-10-CM | POA: Diagnosis not present

## 2020-03-09 DIAGNOSIS — G629 Polyneuropathy, unspecified: Secondary | ICD-10-CM | POA: Diagnosis not present

## 2020-03-09 LAB — HEPATIC FUNCTION PANEL
ALT: 14 U/L (ref 0–35)
AST: 15 U/L (ref 0–37)
Albumin: 4.3 g/dL (ref 3.5–5.2)
Alkaline Phosphatase: 40 U/L (ref 39–117)
Bilirubin, Direct: 0.1 mg/dL (ref 0.0–0.3)
Total Bilirubin: 0.6 mg/dL (ref 0.2–1.2)
Total Protein: 6.5 g/dL (ref 6.0–8.3)

## 2020-03-09 LAB — BASIC METABOLIC PANEL
BUN: 15 mg/dL (ref 6–23)
CO2: 31 mEq/L (ref 19–32)
Calcium: 10.2 mg/dL (ref 8.4–10.5)
Chloride: 103 mEq/L (ref 96–112)
Creatinine, Ser: 0.86 mg/dL (ref 0.40–1.20)
GFR: 65.47 mL/min (ref >60.00)
Glucose, Bld: 91 mg/dL (ref 70–99)
Potassium: 3.7 mEq/L (ref 3.5–5.1)
Sodium: 141 mEq/L (ref 135–145)

## 2020-03-09 LAB — LIPID PANEL
Cholesterol: 102 mg/dL (ref 0–200)
HDL: 34.2 mg/dL — ABNORMAL LOW (ref >39.00)
LDL Cholesterol: 55 mg/dL (ref 0–99)
NonHDL: 67.61
Total CHOL/HDL Ratio: 3
Triglycerides: 63 mg/dL (ref 0.0–149.0)
VLDL: 12.6 mg/dL (ref 0.0–40.0)

## 2020-03-09 LAB — VITAMIN B12: Vitamin B-12: 859 pg/mL (ref 211–911)

## 2020-03-09 LAB — TSH: TSH: 3.71 u[IU]/mL (ref 0.35–4.50)

## 2020-03-11 ENCOUNTER — Ambulatory Visit (INDEPENDENT_AMBULATORY_CARE_PROVIDER_SITE_OTHER): Payer: PPO | Admitting: Internal Medicine

## 2020-03-11 ENCOUNTER — Encounter: Payer: Self-pay | Admitting: Internal Medicine

## 2020-03-11 ENCOUNTER — Other Ambulatory Visit: Payer: Self-pay

## 2020-03-11 VITALS — BP 148/72 | HR 69 | Temp 98.1°F | Resp 16 | Ht 69.0 in | Wt 170.0 lb

## 2020-03-11 DIAGNOSIS — E7800 Pure hypercholesterolemia, unspecified: Secondary | ICD-10-CM

## 2020-03-11 DIAGNOSIS — I10 Essential (primary) hypertension: Secondary | ICD-10-CM | POA: Diagnosis not present

## 2020-03-11 DIAGNOSIS — Z0000 Encounter for general adult medical examination without abnormal findings: Secondary | ICD-10-CM | POA: Diagnosis not present

## 2020-03-11 DIAGNOSIS — E538 Deficiency of other specified B group vitamins: Secondary | ICD-10-CM

## 2020-03-11 DIAGNOSIS — I2510 Atherosclerotic heart disease of native coronary artery without angina pectoris: Secondary | ICD-10-CM

## 2020-03-11 DIAGNOSIS — G629 Polyneuropathy, unspecified: Secondary | ICD-10-CM

## 2020-03-11 NOTE — Progress Notes (Signed)
[name], female   DOB: 1951-03-03, 69 y.o.   MRN: [id number]   Subjective:    [name], female    DOB: 1951/05/14, 69 y.o.   MRN: [id number]  HPI This visit occurred during the SARS-CoV-2 public health emergency.  Safety protocols were in place, including screening questions prior to the visit, additional usage of staff PPE, and extensive cleaning of exam room while observing appropriate contact time as indicated for disinfecting solutions.  Patient here for her physical exam.  She reports she is doing relatively well.  Followed by cardiology for hypertension and CAD s/p CABG.  Was started on carvedilol 12/2019 (and stopped metoprolol).  Blood pressure elevated and at 02/11/20 follow up appt - carvedilol was increased to 6.25mg  bid.  Recommended f/u in one month.  Also seeing neurology for neuropathy and restless legs.  Recommended continuing on alpha lipoic acid and lyrica.  Was started on requip.  She does feel this regimen is helping.  May be making her feel a little too drowsy.  Discussed adjusting dose of lyrica.  Increased stress - family stress.  Overall appears to be handling things relatively well.  No chest pain.  Breathing stable.  No acid reflux or abdominal pain reported.    Past Medical History:  Diagnosis Date  . Acid reflux   . Coronary artery disease   . 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 Heart murmur   . History of chicken pox   . Hyperlipidemia   . Hypertension   . Insomnia    Past Surgical History:  Procedure Laterality Date  . APPENDECTOMY    . BREAST EXCISIONAL BIOPSY Right    negative over 5 years ago- neg  . BREAST SURGERY     Biopsy  . COLONOSCOPY WITH PROPOFOL N/A 05/16/2016   Procedure: COLONOSCOPY WITH PROPOFOL;  Surgeon: [name], MD;  Location: [facility] ENDOSCOPY;  Service: Endoscopy;  Laterality: N/A;  . CORONARY ARTERY BYPASS GRAFT  05/01/2014   3 vessel, [facility]  . ESOPHAGOGASTRODUODENOSCOPY  (EGD) WITH PROPOFOL N/A 02/23/2017   Procedure: ESOPHAGOGASTRODUODENOSCOPY (EGD) WITH PROPOFOL;  Surgeon: [name], MD;  Location: [place];  Service: Endoscopy;  Laterality: N/A;  . HYSTEROSCOPY WITH D & C    . lipoma removed     removed from forehead  . triple bypass     Family History  Problem Relation Age of Onset  . Breast cancer Other        maternal great aunt  . Heart disease Other        multiple family members  . Heart disease Mother   . Stroke Mother   . Alzheimer's disease Mother   . Heart disease Father   . Stroke Father   . Breast cancer Sister 28  . Alzheimer's disease Maternal Aunt   . Alzheimer's disease Maternal Uncle   . Alzheimer's disease Maternal Grandmother   . Colon cancer Neg Hx   . Diabetes Neg Hx   . Ovarian cancer Neg Hx    Social History   Socioeconomic History  . Marital status: Married    Spouse name: Not on file  . Number of children: Not on file  . Years of education: Not on file  . Highest education level: Not on file  Occupational History  . Not on file  Tobacco Use  . Smoking status: Never Smoker  . Smokeless tobacco: Never Used  Vaping Use  .  Vaping Use: Never used  Substance and Sexual Activity  . Alcohol use: Never    Alcohol/week: 0.0 standard drinks  . Drug use: No  . Sexual activity: Yes    Birth control/protection: Post-menopausal  Other Topics Concern  . Not on file  Social History Narrative  . Not on file   Social Determinants of Health   Financial Resource Strain:   . Difficulty of Paying Living Expenses:   Food Insecurity:   . Worried About Charity fundraiser in the Last Year:   . Arboriculturist in the Last Year:   Transportation Needs:   . Film/video editor (Medical):   Marland Kitchen Lack of Transportation (Non-Medical):   Physical Activity:   . Days of Exercise per Week:   . Minutes of Exercise per Session:   Stress:   . Feeling of Stress :   Social Connections:   . Frequency of Communication  with Friends and Family:   . Frequency of Social Gatherings with Friends and Family:   . Attends Religious Services:   . Active Member of Clubs or Organizations:   . Attends Archivist Meetings:   Marland Kitchen Marital Status:     Outpatient Encounter Medications as of 03/11/2020  Medication Sig  . irbesartan (AVAPRO) 150 MG tablet Take 150 mg by mouth 2 (two) times daily.  . Alpha-Lipoic Acid 100 MG CAPS Take by mouth.  Marland Kitchen amLODipine (NORVASC) 10 MG tablet Take 10 mg by mouth daily.  Marland Kitchen ascorbic acid (VITAMIN C) 1000 MG tablet Take by mouth.  Marland Kitchen aspirin EC 81 MG tablet Take 81 mg by mouth daily.  Marland Kitchen atorvastatin (LIPITOR) 20 MG tablet TAKE 1 TABLET (20 MG TOTAL) BY MOUTH ONCE DAILY.  . calcium-vitamin D (OSCAL WITH D) 500-200 MG-UNIT TABS tablet Take by mouth.  . carvedilol (COREG) 6.25 MG tablet Take 6.25 mg by mouth 2 (two) times daily.  . cyanocobalamin (,VITAMIN B-12,) 1000 MCG/ML injection INJECT 1 ML (1,000 MCG TOTAL) INTO THE MUSCLE EVERY 30 (THIRTY) DAYS.  Mariane Baumgarten Calcium (STOOL SOFTENER PO) Take by mouth.  . hydrochlorothiazide (HYDRODIURIL) 25 MG tablet TAKE 1 TABLET (25 MG TOTAL) BY MOUTH ONCE DAILY.  Marland Kitchen irbesartan (AVAPRO) 150 MG tablet Take 150 mg by mouth daily.  Marland Kitchen loratadine (CLARITIN) 10 MG tablet TAKE 1 TABLET BY MOUTH EVERY DAY  . Omega-3 Fatty Acids (FISH OIL) 1000 MG CAPS Take 1,200 mg by mouth daily.  . pantoprazole (PROTONIX) 40 MG tablet TAKE 1 TABLET (40 MG TOTAL) BY MOUTH DAILY. TAKE 30 MINUTES BEFORE BREAKFAST  . pregabalin (LYRICA) 75 MG capsule TAKE 1-2 CAPSULES BY MOUTH EVERY MORNING AND 2 CAPSULES BY MOUTH EVERY EVENING  . rOPINIRole (REQUIP) 0.25 MG tablet Take 0.25 mg by mouth at bedtime.  . Syringe/Needle, Disp, (SYRINGE 3CC/25GX1") 25G X 1" 3 ML MISC Use as directed with b12 injections.  . traZODone (DESYREL) 50 MG tablet TAKE 1-2 TABLETS (50-100 MG TOTAL) BY MOUTH AT BEDTIME AS NEEDED.  Marland Kitchen triamcinolone cream (KENALOG) 0.1 % Apply 1 application topically 2  (two) times daily. Use for 7 days.  . [DISCONTINUED] metoprolol tartrate (LOPRESSOR) 25 MG tablet Take 25 mg by mouth 2 (two) times daily.   No facility-administered encounter medications on file as of 03/11/2020.    Review of Systems  Constitutional: Negative for appetite change and unexpected weight change.  HENT: Negative for congestion and sinus pressure.   Eyes: Negative for pain and visual disturbance.  Respiratory: Negative for cough, chest  tightness and shortness of breath.   Cardiovascular: Negative for chest pain, palpitations and leg swelling.  Gastrointestinal: Negative for abdominal pain, diarrhea, nausea and vomiting.  Genitourinary: Negative for difficulty urinating and dysuria.  Musculoskeletal: Negative for joint swelling and myalgias.  Skin: Negative for color change and rash.  Neurological: Negative for dizziness, light-headedness and headaches.  Hematological: Negative for adenopathy. Does not bruise/bleed easily.  Psychiatric/Behavioral: Negative for agitation and dysphoric mood.       Increased stress as outlined.         Objective:    Physical Exam Vitals reviewed.  Constitutional:      General: She is not in acute distress.    Appearance: Normal appearance. She is well-developed.  HENT:     Head: Normocephalic and atraumatic.     Right Ear: External ear normal.     Left Ear: External ear normal.  Eyes:     General: No scleral icterus.       Right eye: No discharge.        Left eye: No discharge.     Conjunctiva/sclera: Conjunctivae normal.  Neck:     Thyroid: No thyromegaly.  Cardiovascular:     Rate and Rhythm: Normal rate and regular rhythm.  Pulmonary:     Effort: No tachypnea, accessory muscle usage or respiratory distress.     Breath sounds: Normal breath sounds. No decreased breath sounds or wheezing.  Chest:     Breasts:        Right: No inverted nipple, mass, nipple discharge or tenderness (no axillary adenopathy).        Left: No  inverted nipple, mass, nipple discharge or tenderness (no axilarry adenopathy).  Abdominal:     General: Bowel sounds are normal.     Palpations: Abdomen is soft.     Tenderness: There is no abdominal tenderness.  Musculoskeletal:        General: No swelling or tenderness.     Cervical back: Neck supple. No tenderness.  Lymphadenopathy:     Cervical: No cervical adenopathy.  Skin:    General: Skin is warm.     Findings: No erythema or rash.  Neurological:     Mental Status: She is alert and oriented to person, place, and time.  Psychiatric:        Mood and Affect: Mood normal.        Behavior: Behavior normal.     BP (!) 148/72   Pulse 69   Temp 98.1 F (36.7 C)   Resp 16   Ht 5\' 9"  (1.753 m)   Wt 170 lb (77.1 kg)   SpO2 99%   BMI 25.10 kg/m  Wt Readings from Last 3 Encounters:  03/11/20 170 lb (77.1 kg)  11/06/19 176 lb (79.8 kg)  06/27/19 176 lb (79.8 kg)     Lab Results  Component Value Date   WBC 8.4 11/26/2019   HGB 14.0 11/26/2019   HCT 41.4 11/26/2019   PLT 183 11/26/2019   GLUCOSE 91 03/09/2020   CHOL 102 03/09/2020   TRIG 63.0 03/09/2020   HDL 34.20 (L) 03/09/2020   LDLCALC 55 03/09/2020   ALT 14 03/09/2020   AST 15 03/09/2020   NA 141 03/09/2020   K 3.7 03/09/2020   CL 103 03/09/2020   CREATININE 0.86 03/09/2020   BUN 15 03/09/2020   CO2 31 03/09/2020   TSH 3.71 03/09/2020    DG Chest 2 View  Result Date: 11/26/2019 CLINICAL DATA:  Hypertension and chest  pain EXAM: CHEST - 2 VIEW COMPARISON:  None. FINDINGS: The heart size and mediastinal contours are within normal limits. Both lungs are clear. The visualized skeletal structures are unremarkable. Remote median sternotomy. IMPRESSION: No active cardiopulmonary disease. Electronically Signed   By: Ulyses Jarred M.D.   On: 11/26/2019 21:21       Assessment & Plan:   Problem List Items Addressed This Visit    B12 deficiency    Change to oral b12.       Relevant Orders   Vitamin B12    CAD (coronary artery disease)    Followed by cardiology.  Continue risk factor modification.        Relevant Medications   carvedilol (COREG) 6.25 MG tablet   irbesartan (AVAPRO) 150 MG tablet   Essential hypertension    Blood pressure as outlined.  Continue on amlodipine, hctz and avapro.  Metoprolol recently changed to carvedilol.  Dose increased.  Follow pressures.  Follow metabolic panel.       Relevant Medications   carvedilol (COREG) 6.25 MG tablet   irbesartan (AVAPRO) 150 MG tablet   Other Relevant Orders   Basic metabolic panel   Health care maintenance    Physical today 03/12/20.  Colonoscopy 05/2016 - inflammation.  Recommended f/u colonoscopy in 10 years.  Mammogram 05/20/19 - Birads I.       Hypercholesterolemia    On lipitor.  Low cholesterol diet and exercise.  Follow lipid panel and liver function tests.       Relevant Medications   carvedilol (COREG) 6.25 MG tablet   irbesartan (AVAPRO) 150 MG tablet   Other Relevant Orders   Hepatic function panel   Lipid panel   Neuropathy    Had NCS - polyneuropathy.  Saw neurology.  On lyrica.  Recently started requip for RLS.  Helping.  Combination may be a little too much.  Adjust lyrica dose.  Follow.            Einar Pheasant, MD

## 2020-03-21 ENCOUNTER — Encounter: Payer: Self-pay | Admitting: Internal Medicine

## 2020-03-21 DIAGNOSIS — E538 Deficiency of other specified B group vitamins: Secondary | ICD-10-CM | POA: Insufficient documentation

## 2020-03-21 NOTE — Assessment & Plan Note (Signed)
Physical today 03/12/20.  Colonoscopy 05/2016 - inflammation.  Recommended f/u colonoscopy in 10 years.  Mammogram 05/20/19 - Birads I.

## 2020-03-21 NOTE — Assessment & Plan Note (Signed)
Followed by cardiology.  Continue risk factor modification.   

## 2020-03-21 NOTE — Assessment & Plan Note (Signed)
Had NCS - polyneuropathy.  Saw neurology.  On lyrica.  Recently started requip for RLS.  Helping.  Combination may be a little too much.  Adjust lyrica dose.  Follow.

## 2020-03-21 NOTE — Assessment & Plan Note (Signed)
On lipitor.  Low cholesterol diet and exercise.  Follow lipid panel and liver function tests.   

## 2020-03-21 NOTE — Assessment & Plan Note (Signed)
Change to oral b12.

## 2020-03-21 NOTE — Assessment & Plan Note (Signed)
Blood pressure as outlined.  Continue on amlodipine, hctz and avapro.  Metoprolol recently changed to carvedilol.  Dose increased.  Follow pressures.  Follow metabolic panel.

## 2020-03-22 DIAGNOSIS — I10 Essential (primary) hypertension: Secondary | ICD-10-CM | POA: Diagnosis not present

## 2020-03-22 DIAGNOSIS — E782 Mixed hyperlipidemia: Secondary | ICD-10-CM | POA: Diagnosis not present

## 2020-03-22 DIAGNOSIS — I25810 Atherosclerosis of coronary artery bypass graft(s) without angina pectoris: Secondary | ICD-10-CM | POA: Diagnosis not present

## 2020-04-06 ENCOUNTER — Other Ambulatory Visit: Payer: Self-pay

## 2020-04-14 ENCOUNTER — Telehealth: Payer: Self-pay

## 2020-04-14 ENCOUNTER — Other Ambulatory Visit: Payer: Self-pay | Admitting: Obstetrics & Gynecology

## 2020-04-14 ENCOUNTER — Other Ambulatory Visit: Payer: Self-pay

## 2020-04-14 ENCOUNTER — Other Ambulatory Visit: Payer: Self-pay | Admitting: Internal Medicine

## 2020-04-14 DIAGNOSIS — Z1231 Encounter for screening mammogram for malignant neoplasm of breast: Secondary | ICD-10-CM

## 2020-04-14 NOTE — Telephone Encounter (Signed)
As long as she gets MMG this year, all else can wait

## 2020-04-14 NOTE — Telephone Encounter (Signed)
Pt calling; m'care only pays for annual every 31yrs; ck to see if [place] needs her to come in qyr or wait 'til next yr?  [phone]

## 2020-04-14 NOTE — Telephone Encounter (Signed)
Pt aware.  Has mammogram scheduled.

## 2020-05-06 DIAGNOSIS — D2261 Melanocytic nevi of right upper limb, including shoulder: Secondary | ICD-10-CM | POA: Diagnosis not present

## 2020-05-06 DIAGNOSIS — D225 Melanocytic nevi of trunk: Secondary | ICD-10-CM | POA: Diagnosis not present

## 2020-05-06 DIAGNOSIS — D2272 Melanocytic nevi of left lower limb, including hip: Secondary | ICD-10-CM | POA: Diagnosis not present

## 2020-05-06 DIAGNOSIS — L910 Hypertrophic scar: Secondary | ICD-10-CM | POA: Diagnosis not present

## 2020-05-06 DIAGNOSIS — D2271 Melanocytic nevi of right lower limb, including hip: Secondary | ICD-10-CM | POA: Diagnosis not present

## 2020-05-06 DIAGNOSIS — L821 Other seborrheic keratosis: Secondary | ICD-10-CM | POA: Diagnosis not present

## 2020-05-06 DIAGNOSIS — Z85828 Personal history of other malignant neoplasm of skin: Secondary | ICD-10-CM | POA: Diagnosis not present

## 2020-05-13 ENCOUNTER — Other Ambulatory Visit: Payer: Self-pay | Admitting: Internal Medicine

## 2020-05-13 ENCOUNTER — Encounter: Payer: Self-pay | Admitting: Internal Medicine

## 2020-05-14 NOTE — Telephone Encounter (Signed)
rx ok'd for lyrica #120 with 2 refills.

## 2020-05-20 ENCOUNTER — Ambulatory Visit
Admission: RE | Admit: 2020-05-20 | Discharge: 2020-05-20 | Disposition: A | Discharge status: Home / Self Care | Payer: PPO | Source: Ambulatory Visit | Attending: Obstetrics & Gynecology | Admitting: Obstetrics & Gynecology

## 2020-05-20 DIAGNOSIS — Z1231 Encounter for screening mammogram for malignant neoplasm of breast: Secondary | ICD-10-CM | POA: Insufficient documentation

## 2020-05-31 ENCOUNTER — Other Ambulatory Visit: Payer: Self-pay

## 2020-05-31 ENCOUNTER — Ambulatory Visit: Payer: PPO | Attending: Critical Care Medicine

## 2020-05-31 DIAGNOSIS — Z20822 Contact with and (suspected) exposure to covid-19: Secondary | ICD-10-CM | POA: Diagnosis not present

## 2020-06-01 ENCOUNTER — Encounter: Payer: Self-pay | Admitting: Internal Medicine

## 2020-06-02 LAB — SARS-COV-2, NAA 2 DAY TAT

## 2020-06-02 LAB — NOVEL CORONAVIRUS, NAA: SARS-CoV-2, NAA: DETECTED — AB

## 2020-06-03 ENCOUNTER — Other Ambulatory Visit: Payer: Self-pay | Admitting: Family

## 2020-06-03 DIAGNOSIS — U071 COVID-19: Secondary | ICD-10-CM

## 2020-06-03 NOTE — Progress Notes (Signed)
I connected by phone with Alexandra Mendoza on 06/03/2020 at 3:54 PM to discuss the potential use of a new treatment for mild to moderate COVID-19 viral infection in non-hospitalized patients.  This patient is a 69 y.o. female that meets the FDA criteria for Emergency Use Authorization of COVID monoclonal antibody casirivimab/imdevimab.  Has a (+) direct SARS-CoV-2 viral test result  Has mild or moderate COVID-19   Is NOT hospitalized due to COVID-19  Is within 10 days of symptom onset  Has at least one of the high risk factor(s) for progression to severe COVID-19 and/or hospitalization as defined in EUA.  Specific high risk criteria : Older age (>/= 69 yo) and Cardiovascular disease or hypertension Symptoms of fever, ache, cough, began 05/30/20. Diarrhea began 06/03/20.  I have spoken and communicated the following to the patient or parent/caregiver regarding COVID monoclonal antibody treatment:  1. FDA has authorized the emergency use for the treatment of mild to moderate COVID-19 in adults and pediatric patients with positive results of direct SARS-CoV-2 viral testing who are 63 years of age and older weighing at least 40 kg, and who are at high risk for progressing to severe COVID-19 and/or hospitalization.  2. The significant known and potential risks and benefits of COVID monoclonal antibody, and the extent to which such potential risks and benefits are unknown.  3. Information on available alternative treatments and the risks and benefits of those alternatives, including clinical trials.  4. Patients treated with COVID monoclonal antibody should continue to self-isolate and use infection control measures (e.g., wear mask, isolate, social distance, avoid sharing personal items, clean and disinfect "high touch" surfaces, and frequent handwashing) according to CDC guidelines.   5. The patient or parent/caregiver has the option to accept or refuse COVID monoclonal antibody  treatment.  After reviewing this information with the patient, The patient agreed to proceed with receiving casirivimab\imdevimab infusion and will be provided a copy of the Fact sheet prior to receiving the infusion. Asencion Gowda 06/03/2020 3:54 PM

## 2020-06-04 ENCOUNTER — Ambulatory Visit (HOSPITAL_COMMUNITY)
Admission: RE | Admit: 2020-06-04 | Discharge: 2020-06-04 | Disposition: A | Discharge status: Home / Self Care | Payer: Medicare Other | Source: Ambulatory Visit | Attending: Pulmonary Disease | Admitting: Pulmonary Disease

## 2020-06-04 DIAGNOSIS — U071 COVID-19: Secondary | ICD-10-CM | POA: Insufficient documentation

## 2020-06-04 DIAGNOSIS — Z23 Encounter for immunization: Secondary | ICD-10-CM | POA: Insufficient documentation

## 2020-06-04 MED ORDER — METHYLPREDNISOLONE SODIUM SUCC 125 MG IJ SOLR: 125.0000 mg | Freq: Once | INTRAMUSCULAR | Status: DC | PRN

## 2020-06-04 MED ORDER — FAMOTIDINE IN NACL 20-0.9 MG/50ML-% IV SOLN: 20.0000 mg | Freq: Once | INTRAVENOUS | Status: DC | PRN

## 2020-06-04 MED ORDER — EPINEPHRINE 0.3 MG/0.3ML IJ SOAJ: 0.3000 mg | Freq: Once | INTRAMUSCULAR | Status: DC | PRN

## 2020-06-04 MED ORDER — DIPHENHYDRAMINE HCL 50 MG/ML IJ SOLN: 50.0000 mg | Freq: Once | INTRAMUSCULAR | Status: DC | PRN

## 2020-06-04 MED ORDER — SODIUM CHLORIDE 0.9 % IV SOLN
1200.0000 mg | Freq: Once | INTRAVENOUS | Status: AC
  Administered 2020-06-04: 1200 mg via INTRAVENOUS

## 2020-06-04 MED ORDER — SODIUM CHLORIDE 0.9 % IV SOLN: INTRAVENOUS | Status: DC | PRN

## 2020-06-04 MED ORDER — ALBUTEROL SULFATE HFA 108 (90 BASE) MCG/ACT IN AERS: 2.0000 | INHALATION_SPRAY | Freq: Once | RESPIRATORY_TRACT | Status: DC | PRN

## 2020-06-04 NOTE — Progress Notes (Signed)
  Diagnosis: COVID-19  Physician: Dr Lamar Benes  Procedure: [facility] Med: casirivimab\imdevimab infusion - Provided patient with casirivimab\imdevimab fact sheet for patients, parents and caregivers prior to infusion.  Complications: No reactions  Discharge: Discharged home   Ashley Murrain 06/04/2020

## 2020-06-04 NOTE — Discharge Instructions (Signed)

## 2020-06-24 ENCOUNTER — Ambulatory Visit (INDEPENDENT_AMBULATORY_CARE_PROVIDER_SITE_OTHER): Payer: Medicare Other

## 2020-06-24 VITALS — Ht 69.0 in | Wt 170.0 lb

## 2020-06-24 DIAGNOSIS — Z0000 Encounter for general adult medical examination without abnormal findings: Secondary | ICD-10-CM

## 2020-06-24 NOTE — Patient Instructions (Addendum)
Alexandra Mendoza , Thank you for taking time to come for your Medicare Wellness Visit. I appreciate your ongoing commitment to your health goals. Please review the following plan we discussed and let me know if I can assist you in the future.   These are the goals we discussed: Goals    . Follow up with Primary Care Provider     As needed       This is a list of the screening recommended for you and due dates:  Health Maintenance  Topic Date Due  . Pneumonia vaccines (2 of 2 - PPSV23) 07/03/2019  . Flu Shot  04/11/2020  . Mammogram  05/20/2021  . Tetanus Vaccine  08/15/2025  . Colon Cancer Screening  05/16/2026  . DEXA scan (bone density measurement)  Completed  . COVID-19 Vaccine  Completed  .  Hepatitis C: One time screening is recommended by Center for Disease Control  (CDC) for  adults born from [birth year] through 1965.   Completed    Immunizations Immunization History  Administered Date(s) Administered  . Fluad Quad(high Dose 65+) 06/24/2019  . Influenza, High Dose Seasonal PF 06/22/2016, 06/25/2017, 06/21/2018  . Influenza,inj,Quad PF,6+ Mos 07/02/2014, 06/22/2015  . Influenza-Unspecified 07/02/2014  . PFIZER SARS-COV-2 Vaccination 09/29/2019, 10/20/2019  . Pneumococcal Conjugate-13 08/29/2016  . Pneumococcal Polysaccharide-23 07/02/2014  . Tdap 08/16/2015  . Zoster 04/06/2012, 09/11/2013  . Zoster Recombinat (Shingrix) 09/24/2017, 01/01/2018   Keep all routine maintenance appointments.  Next scheduled lab 07/12/20 @ 8:00 Follow up 07/13/20 @ 10:00 Nurse visit 07/02/20 @ 10:30  Advanced directives: End of life planning; Advance aging; Advanced directives discussed.  Copy of current HCPOA/Living Will requested.    Conditions/risks identified: none new  Follow up in one year for your annual wellness visit    Preventive Care 69 Years and Older, Female Preventive care refers to lifestyle choices and visits with your health care provider that can promote health and  wellness. What does preventive care include?  A yearly physical exam. This is also called an annual well check.  Dental exams once or twice a year.  Routine eye exams. Ask your health care provider how often you should have your eyes checked.  Personal lifestyle choices, including:  Daily care of your teeth and gums.  Regular physical activity.  Eating a healthy diet.  Avoiding tobacco and drug use.  Limiting alcohol use.  Practicing safe sex.  Taking low-dose aspirin every day.  Taking vitamin and mineral supplements as recommended by your health care provider. What happens during an annual well check? The services and screenings done by your health care provider during your annual well check will depend on your age, overall health, lifestyle risk factors, and family history of disease. Counseling  Your health care provider may ask you questions about your:  Alcohol use.  Tobacco use.  Drug use.  Emotional well-being.  Home and relationship well-being.  Sexual activity.  Eating habits.  History of falls.  Memory and ability to understand (cognition).  Work and work Statistician.  Reproductive health. Screening  You may have the following tests or measurements:  Height, weight, and BMI.  Blood pressure.  Lipid and cholesterol levels. These may be checked every 5 years, or more frequently if you are over 59 years old.  Skin check.  Lung cancer screening. You may have this screening every year starting at age 32 if you have a 30-pack-year history of smoking and currently smoke or have quit within the past 15 years.  Fecal occult blood test (FOBT) of the stool. You may have this test every year starting at age 58.  Flexible sigmoidoscopy or colonoscopy. You may have a sigmoidoscopy every 5 years or a colonoscopy every 10 years starting at age 46.  Hepatitis C blood test.  Hepatitis B blood test.  Sexually transmitted disease (STD)  testing.  Diabetes screening. This is done by checking your blood sugar (glucose) after you have not eaten for a while (fasting). You may have this done every 1-3 years.  Bone density scan. This is done to screen for osteoporosis. You may have this done starting at age 48.  Mammogram. This may be done every 1-2 years. Talk to your health care provider about how often you should have regular mammograms. Talk with your health care provider about your test results, treatment options, and if necessary, the need for more tests. Vaccines  Your health care provider may recommend certain vaccines, such as:  Influenza vaccine. This is recommended every year.  Tetanus, diphtheria, and acellular pertussis (Tdap, Td) vaccine. You may need a Td booster every 10 years.  Zoster vaccine. You may need this after age 67.  Pneumococcal 13-valent conjugate (PCV13) vaccine. One dose is recommended after age 19.  Pneumococcal polysaccharide (PPSV23) vaccine. One dose is recommended after age 35. Talk to your health care provider about which screenings and vaccines you need and how often you need them. This information is not intended to replace advice given to you by your health care provider. Make sure you discuss any questions you have with your health care provider. Document Released: 09/24/2015 Document Revised: 05/17/2016 Document Reviewed: 06/29/2015 Elsevier Interactive Patient Education  2017 [place] Prevention in the Home Falls can cause injuries. They can happen to people of all ages. There are many things you can do to make your home safe and to help prevent falls. What can I do on the outside of my home?  Regularly fix the edges of walkways and driveways and fix any cracks.  Remove anything that might make you trip as you walk through a door, such as a raised step or threshold.  Trim any bushes or trees on the path to your home.  Use bright outdoor lighting.  Clear any walking  paths of anything that might make someone trip, such as rocks or tools.  Regularly check to see if handrails are loose or broken. Make sure that both sides of any steps have handrails.  Any raised decks and porches should have guardrails on the edges.  Have any leaves, snow, or ice cleared regularly.  Use sand or salt on walking paths during winter.  Clean up any spills in your garage right away. This includes oil or grease spills. What can I do in the bathroom?  Use night lights.  Install grab bars by the toilet and in the tub and shower. Do not use towel bars as grab bars.  Use non-skid mats or decals in the tub or shower.  If you need to sit down in the shower, use a plastic, non-slip stool.  Keep the floor dry. Clean up any water that spills on the floor as soon as it happens.  Remove soap buildup in the tub or shower regularly.  Attach bath mats securely with double-sided non-slip rug tape.  Do not have throw rugs and other things on the floor that can make you trip. What can I do in the bedroom?  Use night lights.  Make sure that  you have a light by your bed that is easy to reach.  Do not use any sheets or blankets that are too big for your bed. They should not hang down onto the floor.  Have a firm chair that has side arms. You can use this for support while you get dressed.  Do not have throw rugs and other things on the floor that can make you trip. What can I do in the kitchen?  Clean up any spills right away.  Avoid walking on wet floors.  Keep items that you use a lot in easy-to-reach places.  If you need to reach something above you, use a strong step stool that has a grab bar.  Keep electrical cords out of the way.  Do not use floor polish or wax that makes floors slippery. If you must use wax, use non-skid floor wax.  Do not have throw rugs and other things on the floor that can make you trip. What can I do with my stairs?  Do not leave any items  on the stairs.  Make sure that there are handrails on both sides of the stairs and use them. Fix handrails that are broken or loose. Make sure that handrails are as long as the stairways.  Check any carpeting to make sure that it is firmly attached to the stairs. Fix any carpet that is loose or worn.  Avoid having throw rugs at the top or bottom of the stairs. If you do have throw rugs, attach them to the floor with carpet tape.  Make sure that you have a light switch at the top of the stairs and the bottom of the stairs. If you do not have them, ask someone to add them for you. What else can I do to help prevent falls?  Wear shoes that:  Do not have high heels.  Have rubber bottoms.  Are comfortable and fit you well.  Are closed at the toe. Do not wear sandals.  If you use a stepladder:  Make sure that it is fully opened. Do not climb a closed stepladder.  Make sure that both sides of the stepladder are locked into place.  Ask someone to hold it for you, if possible.  Clearly mark and make sure that you can see:  Any grab bars or handrails.  First and last steps.  Where the edge of each step is.  Use tools that help you move around (mobility aids) if they are needed. These include:  Canes.  Walkers.  Scooters.  Crutches.  Turn on the lights when you go into a dark area. Replace any light bulbs as soon as they burn out.  Set up your furniture so you have a clear path. Avoid moving your furniture around.  If any of your floors are uneven, fix them.  If there are any pets around you, be aware of where they are.  Review your medicines with your doctor. Some medicines can make you feel dizzy. This can increase your chance of falling. Ask your doctor what other things that you can do to help prevent falls. This information is not intended to replace advice given to you by your health care provider. Make sure you discuss any questions you have with your health care  provider. Document Released: 06/24/2009 Document Revised: 02/03/2016 Document Reviewed: 10/02/2014 Elsevier Interactive Patient Education  2017 [employer].

## 2020-06-24 NOTE — Progress Notes (Addendum)
Subjective:   BREYA CASS is a 69 y.o. female who presents for Medicare Annual (Subsequent) preventive examination.  Review of Systems    No ROS.  Medicare Wellness Virtual Visit.   Cardiac Risk Factors include: advanced age (>36men, >85 women);hypertension     Objective:    Today's Vitals   06/24/20 0838  Weight: 170 lb (77.1 kg)  Height: 5\' 9"  (1.753 m)   Body mass index is 25.1 kg/m.  Advanced Directives 06/24/2020 11/26/2019 06/24/2019 05/14/2018 02/23/2017 02/06/2016  Does Patient Have a Medical Advance Directive? Yes No Yes Yes No No  Type of Paramedic of [place];Living will - [place];Living will [place];Living will - -  Does patient want to make changes to medical advance directive? No - Patient declined - No - Patient declined No - Patient declined - -  Copy of [place] in Chart? No - copy requested - No - copy requested No - copy requested - -  Would patient like information on creating a medical advance directive? - No - Patient declined - - Yes (MAU/Ambulatory/Procedural Areas - Information given) No - patient declined information    Current Medications (verified) Outpatient Encounter Medications as of 06/24/2020  Medication Sig  . atorvastatin (LIPITOR) 40 MG tablet Take by mouth.  . Alpha-Lipoic Acid 100 MG CAPS Take by mouth.  Marland Kitchen amLODipine (NORVASC) 10 MG tablet Take 10 mg by mouth daily.  Marland Kitchen ascorbic acid (VITAMIN C) 1000 MG tablet Take by mouth.  Marland Kitchen aspirin EC 81 MG tablet Take 81 mg by mouth daily.  Marland Kitchen atorvastatin (LIPITOR) 20 MG tablet TAKE 1 TABLET (20 MG TOTAL) BY MOUTH ONCE DAILY. (Patient not taking: Reported on 06/24/2020)  . calcium-vitamin D (OSCAL WITH D) 500-200 MG-UNIT TABS tablet Take by mouth.  . carvedilol (COREG) 6.25 MG tablet Take 6.25 mg by mouth 2 (two) times daily.  . cyanocobalamin (,VITAMIN B-12,) 1000 MCG/ML injection INJECT 1 ML (1,000 MCG TOTAL) INTO  THE MUSCLE EVERY 30 (THIRTY) DAYS.  Mariane Baumgarten Calcium (STOOL SOFTENER PO) Take by mouth.  . hydrochlorothiazide (HYDRODIURIL) 25 MG tablet TAKE 1 TABLET (25 MG TOTAL) BY MOUTH ONCE DAILY.  Marland Kitchen irbesartan (AVAPRO) 150 MG tablet Take 150 mg by mouth daily.  . irbesartan (AVAPRO) 150 MG tablet Take 150 mg by mouth 2 (two) times daily.  Marland Kitchen loratadine (CLARITIN) 10 MG tablet TAKE 1 TABLET BY MOUTH EVERY DAY  . Omega-3 Fatty Acids (FISH OIL) 1000 MG CAPS Take 1,200 mg by mouth daily.  . pantoprazole (PROTONIX) 40 MG tablet TAKE 1 TABLET (40 MG TOTAL) BY MOUTH DAILY. TAKE 30 MINUTES BEFORE BREAKFAST  . pregabalin (LYRICA) 75 MG capsule TAKE ONE TO TWO CAPSULES BY MOUTH EVERY MORNING AND TWO CAPSULES BY MOUTH EVERY EVENING  . rOPINIRole (REQUIP) 0.25 MG tablet Take 0.25 mg by mouth at bedtime.  . Syringe/Needle, Disp, (SYRINGE 3CC/25GX1") 25G X 1" 3 ML MISC Use as directed with b12 injections.  . traZODone (DESYREL) 50 MG tablet TAKE 1-2 TABLETS (50-100 MG TOTAL) BY MOUTH AT BEDTIME AS NEEDED.  Marland Kitchen triamcinolone cream (KENALOG) 0.1 % Apply 1 application topically 2 (two) times daily. Use for 7 days.   No facility-administered encounter medications on file as of 06/24/2020.    Allergies (verified) Valsartan   History: Past Medical History:  Diagnosis Date  . Acid reflux   . Coronary artery disease   . 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  Heart murmur   . History of chicken pox   . Hyperlipidemia   . Hypertension   . Insomnia    Past Surgical History:  Procedure Laterality Date  . APPENDECTOMY    . BREAST EXCISIONAL BIOPSY Right    negative over 5 years ago- neg  . BREAST SURGERY     Biopsy  . COLONOSCOPY WITH PROPOFOL N/A 05/16/2016   Procedure: COLONOSCOPY WITH PROPOFOL;  Surgeon: [name], MD;  Location: [facility] ENDOSCOPY;  Service: Endoscopy;  Laterality: N/A;  . CORONARY ARTERY BYPASS GRAFT  05/01/2014   3 vessel, [facility]  . ESOPHAGOGASTRODUODENOSCOPY (EGD)  WITH PROPOFOL N/A 02/23/2017   Procedure: ESOPHAGOGASTRODUODENOSCOPY (EGD) WITH PROPOFOL;  Surgeon: [name], MD;  Location: [place];  Service: Endoscopy;  Laterality: N/A;  . HYSTEROSCOPY WITH D & C    . lipoma removed     removed from forehead  . triple bypass     Family History  Problem Relation Age of Onset  . Breast cancer Other        maternal great aunt  . Heart disease Other        multiple family members  . Heart disease Mother   . Stroke Mother   . Alzheimer's disease Mother   . Heart disease Father   . Stroke Father   . Breast cancer Sister 53  . Alzheimer's disease Maternal Aunt   . Alzheimer's disease Maternal Uncle   . Alzheimer's disease Maternal Grandmother   . Colon cancer Neg Hx   . Diabetes Neg Hx   . Ovarian cancer Neg Hx    Social History   Socioeconomic History  . Marital status: Married    Spouse name: Not on file  . Number of children: Not on file  . Years of education: Not on file  . Highest education level: Not on file  Occupational History  . Not on file  Tobacco Use  . Smoking status: Never Smoker  . Smokeless tobacco: Never Used  Vaping Use  . Vaping Use: Never used  Substance and Sexual Activity  . Alcohol use: Never    Alcohol/week: 0.0 standard drinks  . Drug use: No  . Sexual activity: Yes    Birth control/protection: Post-menopausal  Other Topics Concern  . Not on file  Social History Narrative  . Not on file   Social Determinants of Health   Financial Resource Strain: Low Risk   . Difficulty of Paying Living Expenses: Not hard at all  Food Insecurity: No Food Insecurity  . Worried About Charity fundraiser in the Last Year: Never true  . Ran Out of Food in the Last Year: Never true  Transportation Needs: No Transportation Needs  . Lack of Transportation (Medical): No  . Lack of Transportation (Non-Medical): No  Physical Activity: Sufficiently Active  . Days of Exercise per Week: 3 days  . Minutes of  Exercise per Session: 50 min  Stress: No Stress Concern Present  . Feeling of Stress : Not at all  Social Connections: Unknown  . Frequency of Communication with Friends and Family: More than three times a week  . Frequency of Social Gatherings with Friends and Family: More than three times a week  . Attends Religious Services: More than 4 times per year  . Active Member of Clubs or Organizations: Yes  . Attends Archivist Meetings: More than 4 times per year  . Marital Status: Not on file    Tobacco  Counseling Counseling given: Not Answered   Clinical Intake:  Pre-visit preparation completed: Yes        Diabetes: No  How often do you need to have someone help you when you read instructions, pamphlets, or other written materials from your doctor or pharmacy?: 1 - Never  Interpreter Needed?: No      Activities of Daily Living In your present state of health, do you have any difficulty performing the following activities: 06/24/2020  Hearing? N  Vision? N  Difficulty concentrating or making decisions? N  Walking or climbing stairs? N  Dressing or bathing? N  Doing errands, shopping? N  Preparing Food and eating ? N  Using the Toilet? N  In the past six months, have you accidently leaked urine? N  Do you have problems with loss of bowel control? N  Managing your Medications? N  Managing your Finances? N  Housekeeping or managing your Housekeeping? N  Some recent data might be hidden    Patient Care Team: Einar Pheasant, MD as PCP - General (Internal Medicine)  Indicate any recent Medical Services you may have received from other than [facility] providers in the past year (date may be approximate).     Assessment:   This is a routine wellness examination for [place].  I connected with Dianelly today by telephone and verified that I am speaking with the correct person using two identifiers. Location patient: home Location provider: work Persons  participating in the virtual visit: patient, Marine scientist.    I discussed the limitations, risks, security and privacy concerns of performing an evaluation and management service by telephone and the availability of in person appointments. The patient expressed understanding and verbally consented to this telephonic visit.    Interactive audio and video telecommunications were attempted between this provider and patient, however failed, due to patient having technical difficulties OR patient did not have access to video capability.  We continued and completed visit with audio only.  Some vital signs may be absent or patient reported.   Hearing/Vision screen  Hearing Screening   125Hz  250Hz  500Hz  1000Hz  2000Hz  3000Hz  4000Hz  6000Hz  8000Hz   Right ear:           Left ear:           Comments: Patient is able to hear conversational tones without difficulty. No issues reported.  Vision Screening Comments: Visual acuity not assessed, virtual visit.   Dietary issues and exercise activities discussed: Current Exercise Habits: Home exercise routine, Type of exercise: walking, Time (Minutes): 50, Frequency (Times/Week): 3, Weekly Exercise (Minutes/Week): 150  Goals    . Follow up with Primary Care Provider     As needed      Depression Screen PHQ 2/9 Scores 06/24/2020 06/24/2019 03/10/2019 06/18/2018 05/14/2018 07/17/2017 03/12/2017  PHQ - 2 Score 0 0 0 1 0 0 0  PHQ- 9 Score - - - 3 - - 0    Fall Risk Fall Risk  06/24/2020 06/24/2019 03/10/2019 05/14/2018 07/17/2017  Falls in the past year? 0 0 0 No No  Number falls in past yr: 0 - - - -  Follow up Falls evaluation completed - - - -   Handrails in use when climbing stairs? Yes Home free of loose throw rugs in walkways, pet beds, electrical cords, etc? Yes  Adequate lighting in your home to reduce risk of falls? Yes   ASSISTIVE DEVICES UTILIZED TO PREVENT FALLS: Use of a cane, walker or w/c? No   TIMED UP AND  GO: Was the test performed? No . Virtual  visit.  Cognitive Function: Patient is alert and oriented x3.  Denies difficulty focusing, making decisions, memory loss.  Enjoys brain challenging activities like crossword puzzles for brain health.   MMSE - Mini Mental State Exam 05/14/2018  Orientation to time 5  Orientation to Place 5  Registration 3  Attention/ Calculation 5  Recall 3  Language- name 2 objects 2  Language- repeat 1  Language- follow 3 step command 3  Language- read & follow direction 1  Write a sentence 1  Copy design 1  Total score 30     6CIT Screen 06/24/2019  What Year? 0 points  What month? 0 points  What time? 0 points  Count back from 20 0 points  Months in reverse 0 points  Repeat phrase 0 points  Total Score 0    Immunizations Immunization History  Administered Date(s) Administered  . Fluad Quad(high Dose 65+) 06/24/2019  . Influenza, High Dose Seasonal PF 06/22/2016, 06/25/2017, 06/21/2018  . Influenza,inj,Quad PF,6+ Mos 07/02/2014, 06/22/2015  . Influenza-Unspecified 07/02/2014  . PFIZER SARS-COV-2 Vaccination 09/29/2019, 10/20/2019  . Pneumococcal Conjugate-13 08/29/2016  . Pneumococcal Polysaccharide-23 07/02/2014  . Tdap 08/16/2015  . Zoster 04/06/2012, 09/11/2013  . Zoster Recombinat (Shingrix) 09/24/2017, 01/01/2018   Health Maintenance Health Maintenance  Topic Date Due  . PNA vac Low Risk Adult (2 of 2 - PPSV23) 07/03/2019  . INFLUENZA VACCINE  04/11/2020  . MAMMOGRAM  05/20/2021  . TETANUS/TDAP  08/15/2025  . COLONOSCOPY  05/16/2026  . DEXA SCAN  Completed  . COVID-19 Vaccine  Completed  . Hepatitis C Screening  Completed   Dental Screening: Recommended annual dental exams for proper oral hygiene  Community Resource Referral / Chronic Care Management: CRR required this visit?  No   CCM required this visit?  No      Plan:   Keep all routine maintenance appointments.  Next scheduled lab 07/12/20 @ 8:00 Follow up 07/13/20 @ 10:00 Nurse visit 07/02/20 @ 10:30  I  have personally reviewed and noted the following in the patient's chart:   . Medical and social history . Use of alcohol, tobacco or illicit drugs  . Current medications and supplements . Functional ability and status . Nutritional status . Physical activity . Advanced directives . List of other physicians . Hospitalizations, surgeries, and ER visits in previous 12 months . Vitals . Screenings to include cognitive, depression, and falls . Referrals and appointments  In addition, I have reviewed and discussed with patient certain preventive protocols, quality metrics, and best practice recommendations. A written personalized care plan for preventive services as well as general preventive health recommendations were provided to patient via mychart.     Varney Biles, LPN   65/[id number]/1275     Reviewed above information.  Agree with assessment and plan.   Dr Nicki Reaper

## 2020-07-01 ENCOUNTER — Ambulatory Visit (INDEPENDENT_AMBULATORY_CARE_PROVIDER_SITE_OTHER): Payer: PPO

## 2020-07-01 ENCOUNTER — Other Ambulatory Visit: Payer: Self-pay

## 2020-07-01 DIAGNOSIS — Z23 Encounter for immunization: Secondary | ICD-10-CM | POA: Diagnosis not present

## 2020-07-02 ENCOUNTER — Ambulatory Visit: Payer: Medicare Other

## 2020-07-09 ENCOUNTER — Other Ambulatory Visit: Payer: PPO

## 2020-07-12 ENCOUNTER — Other Ambulatory Visit (INDEPENDENT_AMBULATORY_CARE_PROVIDER_SITE_OTHER): Payer: PPO

## 2020-07-12 ENCOUNTER — Other Ambulatory Visit: Payer: Self-pay

## 2020-07-12 DIAGNOSIS — E538 Deficiency of other specified B group vitamins: Secondary | ICD-10-CM

## 2020-07-12 DIAGNOSIS — E7800 Pure hypercholesterolemia, unspecified: Secondary | ICD-10-CM

## 2020-07-12 DIAGNOSIS — I10 Essential (primary) hypertension: Secondary | ICD-10-CM | POA: Diagnosis not present

## 2020-07-12 LAB — BASIC METABOLIC PANEL
BUN: 13 mg/dL (ref 6–23)
CO2: 31 mEq/L (ref 19–32)
Calcium: 10.1 mg/dL (ref 8.4–10.5)
Chloride: 104 mEq/L (ref 96–112)
Creatinine, Ser: 0.79 mg/dL (ref 0.40–1.20)
GFR: 76.48 mL/min (ref >60.00)
Glucose, Bld: 85 mg/dL (ref 70–99)
Potassium: 4 mEq/L (ref 3.5–5.1)
Sodium: 142 mEq/L (ref 135–145)

## 2020-07-12 LAB — LIPID PANEL
Cholesterol: 97 mg/dL (ref 0–200)
HDL: 36.3 mg/dL — ABNORMAL LOW (ref >39.00)
LDL Cholesterol: 45 mg/dL (ref 0–99)
NonHDL: 60.4
Total CHOL/HDL Ratio: 3
Triglycerides: 76 mg/dL (ref 0.0–149.0)
VLDL: 15.2 mg/dL (ref 0.0–40.0)

## 2020-07-12 LAB — HEPATIC FUNCTION PANEL
ALT: 19 U/L (ref 0–35)
AST: 18 U/L (ref 0–37)
Albumin: 4.2 g/dL (ref 3.5–5.2)
Alkaline Phosphatase: 36 U/L — ABNORMAL LOW (ref 39–117)
Bilirubin, Direct: 0.1 mg/dL (ref 0.0–0.3)
Total Bilirubin: 0.7 mg/dL (ref 0.2–1.2)
Total Protein: 6.6 g/dL (ref 6.0–8.3)

## 2020-07-12 LAB — VITAMIN B12: Vitamin B-12: 791 pg/mL (ref 211–911)

## 2020-07-13 ENCOUNTER — Ambulatory Visit (INDEPENDENT_AMBULATORY_CARE_PROVIDER_SITE_OTHER): Payer: PPO | Admitting: Internal Medicine

## 2020-07-13 VITALS — BP 132/70 | HR 68 | Temp 97.9°F | Resp 16 | Ht 69.0 in | Wt 169.0 lb

## 2020-07-13 DIAGNOSIS — M79601 Pain in right arm: Secondary | ICD-10-CM | POA: Diagnosis not present

## 2020-07-13 DIAGNOSIS — G629 Polyneuropathy, unspecified: Secondary | ICD-10-CM

## 2020-07-13 DIAGNOSIS — E7800 Pure hypercholesterolemia, unspecified: Secondary | ICD-10-CM | POA: Diagnosis not present

## 2020-07-13 DIAGNOSIS — I10 Essential (primary) hypertension: Secondary | ICD-10-CM

## 2020-07-13 DIAGNOSIS — R221 Localized swelling, mass and lump, neck: Secondary | ICD-10-CM

## 2020-07-13 DIAGNOSIS — F419 Anxiety disorder, unspecified: Secondary | ICD-10-CM

## 2020-07-13 DIAGNOSIS — Z23 Encounter for immunization: Secondary | ICD-10-CM

## 2020-07-13 DIAGNOSIS — I2510 Atherosclerotic heart disease of native coronary artery without angina pectoris: Secondary | ICD-10-CM

## 2020-07-13 NOTE — Progress Notes (Signed)
[name], female   DOB: [birth year], 69 y.o.   MRN: [id number]   Subjective:    [name], female    DOB: 03/04/1951, 69 y.o.   MRN: [id number]  HPI This visit occurred during the SARS-CoV-2 public health emergency.  Safety protocols were in place, including screening questions prior to the visit, additional usage of staff PPE, and extensive cleaning of exam room while observing appropriate contact time as indicated for disinfecting solutions.  Patient here for a scheduled follow up.  She reports she is doing relatively well.  Had covid in 05/2020.  Received covid infusion 06/04/20.  No significant residual problems from covid.  Has known CAD.  Followed by cardiology.  No chest pain or sob reported.  Tries to stay active.  She has been having some increased pain - right arm - upper and mid arm.  Notices when reaching and putting jacket on, etc.  Has appt with ortho next week given persistent pain.  Taking tylenol.  On increased lipitor - 40mg  - since 03/2020.  No acid reflux reported.  No abdominal pain or bowel change.  Blood pressures averaging 130-140/60-70.  On coreg.  Taking lyrica - to help with neuropathy.    Past Medical History:  Diagnosis Date  . Acid reflux   . Coronary artery disease   . 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 Heart murmur   . History of chicken pox   . Hyperlipidemia   . Hypertension   . Insomnia    Past Surgical History:  Procedure Laterality Date  . APPENDECTOMY    . BREAST EXCISIONAL BIOPSY Right    negative over 5 years ago- neg  . BREAST SURGERY     Biopsy  . COLONOSCOPY WITH PROPOFOL N/A 05/16/2016   Procedure: COLONOSCOPY WITH PROPOFOL;  Surgeon: [name], MD;  Location: [facility] ENDOSCOPY;  Service: Endoscopy;  Laterality: N/A;  . CORONARY ARTERY BYPASS GRAFT  05/01/2014   3 vessel, [facility]  . ESOPHAGOGASTRODUODENOSCOPY (EGD) WITH PROPOFOL N/A 02/23/2017   Procedure: ESOPHAGOGASTRODUODENOSCOPY (EGD)  WITH PROPOFOL;  Surgeon: [name], MD;  Location: [place];  Service: Endoscopy;  Laterality: N/A;  . HYSTEROSCOPY WITH D & C    . lipoma removed     removed from forehead  . triple bypass     Family History  Problem Relation Age of Onset  . Breast cancer Other        maternal great aunt  . Heart disease Other        multiple family members  . Heart disease Mother   . Stroke Mother   . Alzheimer's disease Mother   . Heart disease Father   . Stroke Father   . Breast cancer Sister 76  . Alzheimer's disease Maternal Aunt   . Alzheimer's disease Maternal Uncle   . Alzheimer's disease Maternal Grandmother   . Colon cancer Neg Hx   . Diabetes Neg Hx   . Ovarian cancer Neg Hx    Social History   Socioeconomic History  . Marital status: Married    Spouse name: Not on file  . Number of children: Not on file  . Years of education: Not on file  . Highest education level: Not on file  Occupational History  . Not on file  Tobacco Use  . Smoking status: Never Smoker  . Smokeless tobacco: Never Used  Vaping Use  . Vaping Use: Never used  Substance and Sexual Activity  . Alcohol use: Never    Alcohol/week: 0.0 standard drinks  . Drug use: No  . Sexual activity: Yes    Birth control/protection: Post-menopausal  Other Topics Concern  . Not on file  Social History Narrative  . Not on file   Social Determinants of Health   Financial Resource Strain: Low Risk   . Difficulty of Paying Living Expenses: Not hard at all  Food Insecurity: No Food Insecurity  . Worried About Charity fundraiser in the Last Year: Never true  . Ran Out of Food in the Last Year: Never true  Transportation Needs: No Transportation Needs  . Lack of Transportation (Medical): No  . Lack of Transportation (Non-Medical): No  Physical Activity: Sufficiently Active  . Days of Exercise per Week: 3 days  . Minutes of Exercise per Session: 50 min  Stress: No Stress Concern Present  . Feeling of  Stress : Not at all  Social Connections: Unknown  . Frequency of Communication with Friends and Family: More than three times a week  . Frequency of Social Gatherings with Friends and Family: More than three times a week  . Attends Religious Services: More than 4 times per year  . Active Member of Clubs or Organizations: Yes  . Attends Archivist Meetings: More than 4 times per year  . Marital Status: Not on file    Outpatient Encounter Medications as of 07/13/2020  Medication Sig  . Alpha-Lipoic Acid 100 MG CAPS Take by mouth.  Marland Kitchen amLODipine (NORVASC) 10 MG tablet Take 10 mg by mouth daily.  Marland Kitchen ascorbic acid (VITAMIN C) 1000 MG tablet Take by mouth.  Marland Kitchen aspirin EC 81 MG tablet Take 81 mg by mouth daily.  Marland Kitchen atorvastatin (LIPITOR) 40 MG tablet Take by mouth.  . calcium-vitamin D (OSCAL WITH D) 500-200 MG-UNIT TABS tablet Take by mouth.  . carvedilol (COREG) 6.25 MG tablet Take 6.25 mg by mouth 2 (two) times daily.  . cyanocobalamin (,VITAMIN B-12,) 1000 MCG/ML injection INJECT 1 ML (1,000 MCG TOTAL) INTO THE MUSCLE EVERY 30 (THIRTY) DAYS.  Mariane Baumgarten Calcium (STOOL SOFTENER PO) Take by mouth.  . hydrochlorothiazide (HYDRODIURIL) 25 MG tablet TAKE 1 TABLET (25 MG TOTAL) BY MOUTH ONCE DAILY.  Marland Kitchen irbesartan (AVAPRO) 300 MG tablet Take 300 mg by mouth daily.  Marland Kitchen loratadine (CLARITIN) 10 MG tablet TAKE 1 TABLET BY MOUTH EVERY DAY  . Omega-3 Fatty Acids (FISH OIL) 1000 MG CAPS Take 1,200 mg by mouth daily.  . pregabalin (LYRICA) 75 MG capsule TAKE ONE TO TWO CAPSULES BY MOUTH EVERY MORNING AND TWO CAPSULES BY MOUTH EVERY EVENING  . rOPINIRole (REQUIP) 0.25 MG tablet Take 0.25 mg by mouth at bedtime.  . Syringe/Needle, Disp, (SYRINGE 3CC/25GX1") 25G X 1" 3 ML MISC Use as directed with b12 injections.  . triamcinolone cream (KENALOG) 0.1 % Apply 1 application topically 2 (two) times daily. Use for 7 days.  . [DISCONTINUED] atorvastatin (LIPITOR) 20 MG tablet TAKE 1 TABLET (20 MG TOTAL) BY  MOUTH ONCE DAILY. (Patient not taking: Reported on 06/24/2020)  . [DISCONTINUED] irbesartan (AVAPRO) 150 MG tablet Take 150 mg by mouth daily.  . [DISCONTINUED] irbesartan (AVAPRO) 150 MG tablet Take 150 mg by mouth 2 (two) times daily.  . [DISCONTINUED] pantoprazole (PROTONIX) 40 MG tablet TAKE 1 TABLET (40 MG TOTAL) BY MOUTH DAILY. TAKE 30 MINUTES BEFORE BREAKFAST  . [DISCONTINUED] traZODone (DESYREL) 50 MG tablet TAKE 1-2 TABLETS (50-100 MG TOTAL) BY MOUTH  AT BEDTIME AS NEEDED.   No facility-administered encounter medications on file as of 07/13/2020.    Review of Systems  Constitutional: Negative for appetite change and unexpected weight change.  HENT: Negative for congestion and sinus pressure.   Respiratory: Negative for cough, chest tightness and shortness of breath.   Cardiovascular: Negative for chest pain, palpitations and leg swelling.  Gastrointestinal: Negative for abdominal pain, diarrhea, nausea and vomiting.  Genitourinary: Negative for difficulty urinating and dysuria.  Musculoskeletal: Negative for joint swelling and myalgias.       Right arm pain as outlined.   Skin: Negative for color change and rash.  Neurological: Negative for dizziness, light-headedness and headaches.  Psychiatric/Behavioral: Negative for agitation and dysphoric mood.       Objective:    Physical Exam Vitals reviewed.  Constitutional:      General: She is not in acute distress.    Appearance: Normal appearance.  HENT:     Head: Normocephalic and atraumatic.     Right Ear: External ear normal.     Left Ear: External ear normal.  Eyes:     General: No scleral icterus.       Right eye: No discharge.        Left eye: No discharge.     Conjunctiva/sclera: Conjunctivae normal.  Neck:     Thyroid: No thyromegaly.     Comments: Soft tissue fullness -  neck.  No pain.   Cardiovascular:     Rate and Rhythm: Normal rate and regular rhythm.  Pulmonary:     Effort: No respiratory distress.      Breath sounds: Normal breath sounds. No wheezing.  Abdominal:     General: Bowel sounds are normal.     Palpations: Abdomen is soft.     Tenderness: There is no abdominal tenderness.  Musculoskeletal:        General: No swelling or tenderness.     Cervical back: Neck supple. No tenderness.     Comments: No pain mid arm with rotation of forearm.    Skin:    Findings: No erythema or rash.  Neurological:     Mental Status: She is alert.  Psychiatric:        Mood and Affect: Mood normal.        Behavior: Behavior normal.     BP 132/70   Pulse 68   Temp 97.9 F (36.6 C) (Oral)   Resp 16   Ht 5\' 9"  (1.753 m)   Wt 169 lb (76.7 kg)   SpO2 99%   BMI 24.96 kg/m  Wt Readings from Last 3 Encounters:  07/13/20 169 lb (76.7 kg)  06/24/20 170 lb (77.1 kg)  03/11/20 170 lb (77.1 kg)     Lab Results  Component Value Date   WBC 8.4 11/26/2019   HGB 14.0 11/26/2019   HCT 41.4 11/26/2019   PLT 183 11/26/2019   GLUCOSE 85 07/12/2020   CHOL 97 07/12/2020   TRIG 76.0 07/12/2020   HDL 36.30 (L) 07/12/2020   LDLCALC 45 07/12/2020   ALT 19 07/12/2020   AST 18 07/12/2020   NA 142 07/12/2020   K 4.0 07/12/2020   CL 104 07/12/2020   CREATININE 0.79 07/12/2020   BUN 13 07/12/2020   CO2 31 07/12/2020   TSH 3.71 03/09/2020       Assessment & Plan:   Problem List Items Addressed This Visit    Right arm pain    She has already scheduled an appt  with ortho.  Continue tylenol.  Follow.       Neuropathy    Had NCS - polyneuropathy.  On lyrica.  Stable.       Neck fullness    Obtain ultrasound - neck to better evaluate.        Hypercholesterolemia    On lipitor.  Low cholesterol diet and exercise.  Follow lipid panel and liver function tests.        Relevant Medications   irbesartan (AVAPRO) 300 MG tablet   Other Relevant Orders   Hepatic function panel   Lipid panel   Essential hypertension    Continue on coreg, amlodipine, hctz and avapro.  Follow pressures.  Follow  metabolic panel.       Relevant Medications   irbesartan (AVAPRO) 300 MG tablet   Other Relevant Orders   CBC with Differential/Platelet   Basic metabolic panel   CAD (coronary artery disease)    Followed by cardiology.  Continue risk factor modification.  Currently without symptoms.        Relevant Medications   irbesartan (AVAPRO) 300 MG tablet   Anxiety    Overall stable.        Other Visit Diagnoses    Need for 23-polyvalent pneumococcal polysaccharide vaccine    -  Primary   Relevant Orders   Pneumococcal polysaccharide vaccine 23-valent greater than or equal to 2yo subcutaneous/IM (Completed)       Einar Pheasant, MD

## 2020-07-14 ENCOUNTER — Other Ambulatory Visit: Payer: Self-pay | Admitting: Internal Medicine

## 2020-07-16 ENCOUNTER — Telehealth: Payer: Self-pay | Admitting: Internal Medicine

## 2020-07-16 DIAGNOSIS — R221 Localized swelling, mass and lump, neck: Secondary | ICD-10-CM

## 2020-07-16 NOTE — Telephone Encounter (Signed)
Patient aware.

## 2020-07-16 NOTE — Telephone Encounter (Signed)
We just saw her this week.

## 2020-07-16 NOTE — Addendum Note (Signed)
Addended by: Alisa Graff on: 07/16/2020 03:17 PM   Modules accepted: Orders

## 2020-07-16 NOTE — Telephone Encounter (Signed)
Pt called and said that she was supposed to have an ultrasound of her neck scheduled

## 2020-07-16 NOTE — Telephone Encounter (Signed)
Someone should be calling her with an appt.

## 2020-07-19 ENCOUNTER — Encounter: Payer: Self-pay | Admitting: Internal Medicine

## 2020-07-19 DIAGNOSIS — M79601 Pain in right arm: Secondary | ICD-10-CM | POA: Insufficient documentation

## 2020-07-19 DIAGNOSIS — R221 Localized swelling, mass and lump, neck: Secondary | ICD-10-CM | POA: Insufficient documentation

## 2020-07-19 NOTE — Assessment & Plan Note (Signed)
Followed by cardiology.  Continue risk factor modification.  Currently without symptoms.

## 2020-07-19 NOTE — Assessment & Plan Note (Signed)
Overall stable.   

## 2020-07-19 NOTE — Assessment & Plan Note (Signed)
Continue on coreg, amlodipine, hctz and avapro.  Follow pressures.  Follow metabolic panel.

## 2020-07-19 NOTE — Assessment & Plan Note (Signed)
Obtain ultrasound - neck to better evaluate.

## 2020-07-19 NOTE — Assessment & Plan Note (Signed)
On lipitor.  Low cholesterol diet and exercise.  Follow lipid panel and liver function tests.   

## 2020-07-19 NOTE — Assessment & Plan Note (Signed)
She has already scheduled an appt with ortho.  Continue tylenol.  Follow.

## 2020-07-19 NOTE — Assessment & Plan Note (Signed)
Had NCS - polyneuropathy.  On lyrica.  Stable.  

## 2020-07-21 ENCOUNTER — Ambulatory Visit
Admission: RE | Admit: 2020-07-21 | Discharge: 2020-07-21 | Disposition: A | Discharge status: Home / Self Care | Payer: PPO | Source: Ambulatory Visit | Attending: Internal Medicine | Admitting: Internal Medicine

## 2020-07-21 ENCOUNTER — Other Ambulatory Visit: Payer: Self-pay

## 2020-07-21 DIAGNOSIS — R221 Localized swelling, mass and lump, neck: Secondary | ICD-10-CM | POA: Insufficient documentation

## 2020-07-22 DIAGNOSIS — M79601 Pain in right arm: Secondary | ICD-10-CM | POA: Diagnosis not present

## 2020-07-22 DIAGNOSIS — M12811 Other specific arthropathies, not elsewhere classified, right shoulder: Secondary | ICD-10-CM | POA: Diagnosis not present

## 2020-07-23 ENCOUNTER — Other Ambulatory Visit: Payer: Self-pay | Admitting: Internal Medicine

## 2020-07-29 DIAGNOSIS — L929 Granulomatous disorder of the skin and subcutaneous tissue, unspecified: Secondary | ICD-10-CM | POA: Diagnosis not present

## 2020-07-29 DIAGNOSIS — S90212A Contusion of left great toe with damage to nail, initial encounter: Secondary | ICD-10-CM | POA: Diagnosis not present

## 2020-08-18 ENCOUNTER — Encounter: Payer: Self-pay | Admitting: Emergency Medicine

## 2020-08-18 ENCOUNTER — Ambulatory Visit
Admission: EM | Admit: 2020-08-18 | Discharge: 2020-08-18 | Disposition: A | Discharge status: Home / Self Care | Payer: PPO | Attending: Family Medicine | Admitting: Family Medicine

## 2020-08-18 DIAGNOSIS — R350 Frequency of micturition: Secondary | ICD-10-CM

## 2020-08-18 LAB — POCT URINALYSIS DIP (MANUAL ENTRY)
Bilirubin, UA: NEGATIVE
Blood, UA: NEGATIVE
Glucose, UA: NEGATIVE mg/dL
Ketones, POC UA: NEGATIVE mg/dL
Nitrite, UA: NEGATIVE
Protein Ur, POC: NEGATIVE mg/dL
Spec Grav, UA: 1.015 (ref 1.010–1.025)
Urobilinogen, UA: 0.2 E.U./dL
pH, UA: 5.5 (ref 5.0–8.0)

## 2020-08-18 NOTE — ED Triage Notes (Signed)
Since Sunday pt has been having urgency, frequency, and lower abd pressure.  No fevers.

## 2020-08-18 NOTE — Discharge Instructions (Signed)
We will send urine for culture.  We will hold off treating until we get the results.  Make sure you are drinking plenty of fluids Follow up as needed for continued or worsening symptoms

## 2020-08-19 LAB — URINE CULTURE: Culture: NO GROWTH

## 2020-08-19 NOTE — ED Provider Notes (Signed)
[name]    CSN: [id number] Arrival date & time: 08/18/20  1209      History   Chief Complaint Chief Complaint  Patient presents with  . Urinary Tract Infection    HPI Alexandra Mendoza is a 69 y.o. female.   Patient is a 68 year old female the presents today for urinary urgency, frequency and low abdominal pressure.  This is been constant since Sunday.  Feels like it is mildly improved.  No fevers, flank pain.  No nausea or vomiting.  Denies any diarrhea or constipation.  Reporting she has been under a lot of stress recently.     Past Medical History:  Diagnosis Date  . Acid reflux   . Coronary artery disease   . 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 Heart murmur   . History of chicken pox   . Hyperlipidemia   . Hypertension   . Insomnia     Patient Active Problem List   Diagnosis Date Noted  . Neck fullness 07/19/2020  . Right arm pain 07/19/2020  . B12 deficiency 03/21/2020  . Rash 11/09/2019  . Hypercalcemia 06/29/2019  . Neuropathy 02/15/2018  . Vaginal atrophy 06/12/2017  . Menopause 06/12/2017  . Osteopenia 06/12/2017  . Heartburn   . Chest pain 01/08/2017  . Palpitations 11/28/2016  . Headache 07/07/2016  . Special screening for malignant neoplasms, colon   . Benign neoplasm of transverse colon   . Family history of breast cancer in first degree relative 10/25/2015  . Fibrocystic breast changes of both breasts 10/25/2015  . Abdominal discomfort 08/22/2015  . Abnormal vaginal Pap smear 05/18/2015  . Adaptation reaction 05/05/2015  . Anxiety 05/05/2015  . History of chicken pox 05/05/2015  . HLD (hyperlipidemia) 05/05/2015  . 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 Osteoarthrosis 05/05/2015  . Adiposity 05/05/2015  . H/O coronary artery bypass surgery 05/05/2015  . CAD (coronary artery disease) 03/14/2015  . Essential hypertension 03/14/2015  . Hypercholesterolemia 03/14/2015  . Health care maintenance 03/14/2015  . Insomnia  03/08/2015  . Esophagitis, reflux 12/11/2014  . History of atrial fibrillation 06/29/2014  . Arteriosclerosis of bypass graft of coronary artery 06/01/2014  . Billowing mitral valve 06/01/2014  . 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 APPENDECTOMY    . BREAST EXCISIONAL BIOPSY Right    negative over 5 years ago- neg  . BREAST SURGERY     Biopsy  . COLONOSCOPY WITH PROPOFOL N/A 05/16/2016   Procedure: COLONOSCOPY WITH PROPOFOL;  Surgeon: [name], MD;  Location: [facility] ENDOSCOPY;  Service: Endoscopy;  Laterality: N/A;  . CORONARY ARTERY BYPASS GRAFT  05/01/2014   3 vessel, [facility]  . ESOPHAGOGASTRODUODENOSCOPY (EGD) WITH PROPOFOL N/A 02/23/2017   Procedure: ESOPHAGOGASTRODUODENOSCOPY (EGD) WITH PROPOFOL;  Surgeon: [name], MD;  Location: [place];  Service: Endoscopy;  Laterality: N/A;  . HYSTEROSCOPY WITH D & C    . lipoma removed     removed from forehead  . triple bypass      OB History    Gravida  1   Para  1   Term  1   Preterm      AB      Living  1     SAB      IAB      Ectopic      Multiple  Live Births  1            Home Medications    Prior to Admission medications   Medication Sig Start Date End Date Taking? Authorizing Provider  Alpha-Lipoic Acid 100 MG CAPS Take by mouth.    [provider]  amLODipine (NORVASC) 10 MG tablet Take 10 mg by mouth daily. 03/08/15   [provider]  ascorbic acid (VITAMIN C) 1000 MG tablet Take by mouth.    [provider]  aspirin EC 81 MG tablet Take 81 mg by mouth daily. 10/04/12   [provider]  atorvastatin (LIPITOR) 40 MG tablet Take by mouth. 03/22/20 03/22/21  [provider]  calcium-vitamin D (OSCAL WITH D) 500-200 MG-UNIT TABS tablet Take by mouth.    [provider]  carvedilol (COREG) 6.25 MG tablet Take 6.25 mg  by mouth 2 (two) times daily. 02/11/20   [provider]  cyanocobalamin (,VITAMIN B-12,) 1000 MCG/ML injection INJECT 1 ML (1,000 MCG TOTAL) INTO THE MUSCLE EVERY 30 (THIRTY) DAYS. 07/23/20   Einar Pheasant, MD  Docusate Calcium (STOOL SOFTENER PO) Take by mouth.    [provider]  hydrochlorothiazide (HYDRODIURIL) 25 MG tablet TAKE 1 TABLET (25 MG TOTAL) BY MOUTH ONCE DAILY. 03/15/16   [provider]  irbesartan (AVAPRO) 300 MG tablet Take 300 mg by mouth daily. 06/24/20   [provider]  loratadine (CLARITIN) 10 MG tablet TAKE 1 TABLET BY MOUTH EVERY DAY 07/18/19   Guse, Jacquelynn Cree, FNP  Omega-3 Fatty Acids (FISH OIL) 1000 MG CAPS Take 1,200 mg by mouth daily.    [provider]  pantoprazole (PROTONIX) 40 MG tablet TAKE 1 TABLET (40 MG TOTAL) BY MOUTH DAILY. TAKE 30 MINUTES BEFORE BREAKFAST 07/14/20   Einar Pheasant, MD  pregabalin (LYRICA) 75 MG capsule TAKE ONE TO TWO CAPSULES BY MOUTH EVERY MORNING AND TWO CAPSULES BY MOUTH EVERY EVENING 05/14/20   Einar Pheasant, MD  rOPINIRole (REQUIP) 0.25 MG tablet Take 0.25 mg by mouth at bedtime. 01/29/20   [provider]  Syringe/Needle, Disp, (SYRINGE 3CC/25GX1") 25G X 1" 3 ML MISC Use as directed with b12 injections. 12/19/18   Einar Pheasant, MD  traZODone (DESYREL) 50 MG tablet TAKE 1-2 TABLETS (50-100 MG TOTAL) BY MOUTH AT BEDTIME AS NEEDED. 07/14/20   Einar Pheasant, MD  triamcinolone cream (KENALOG) 0.1 % Apply 1 application topically 2 (two) times daily. Use for 7 days. 08/20/18   Jodelle Green, FNP    Family History Family History  Problem Relation Age of Onset  . Breast cancer Other        maternal great aunt  . Heart disease Other        multiple family members  . Heart disease Mother   . Stroke Mother   . Alzheimer's disease Mother   . Heart disease Father   . Stroke Father   . Breast cancer Sister 32  . Alzheimer's disease Maternal Aunt   . Alzheimer's disease Maternal Uncle    . Alzheimer's disease Maternal Grandmother   . Colon cancer Neg Hx   . Diabetes Neg Hx   . Ovarian cancer Neg Hx     Social History Social History   Tobacco Use  . Smoking status: Never Smoker  . Smokeless tobacco: Never Used  Vaping Use  . Vaping Use: Never used  Substance Use Topics  . Alcohol use: Never    Alcohol/week: 0.0 standard drinks  . Drug use: No  Allergies   Valsartan   Review of Systems Review of Systems   Physical Exam Triage Vital Signs ED Triage Vitals  Enc Vitals Group     BP 08/18/20 1224 (!) 161/77     Pulse Rate 08/18/20 1224 61     Resp 08/18/20 1224 16     Temp 08/18/20 1224 98.9 F (37.2 C)     Temp Source 08/18/20 1224 Oral     SpO2 08/18/20 1224 99 %     Weight 08/18/20 1227 165 lb (74.8 kg)     Height --      Head Circumference --      Peak Flow --      Pain Score 08/18/20 1227 0     Pain Loc --      Pain Edu? --      Excl. in [place]? --    No data found.  Updated Vital Signs BP (!) 161/77 (BP Location: Right Arm)   Pulse 61   Temp 98.9 F (37.2 C) (Oral)   Resp 16   Wt 165 lb (74.8 kg)   SpO2 99%   BMI 24.37 kg/m   Visual Acuity Right Eye Distance:   Left Eye Distance:   Bilateral Distance:    Right Eye Near:   Left Eye Near:    Bilateral Near:     Physical Exam Vitals and nursing note reviewed.  Constitutional:      General: She is not in acute distress.    Appearance: Normal appearance. She is not ill-appearing, toxic-appearing or diaphoretic.  HENT:     Head: Normocephalic.     Nose: Nose normal.  Eyes:     Conjunctiva/sclera: Conjunctivae normal.  Pulmonary:     Effort: Pulmonary effort is normal.  Musculoskeletal:        General: Normal range of motion.     Cervical back: Normal range of motion.  Skin:    General: Skin is warm and dry.     Findings: No rash.  Neurological:     Mental Status: She is alert.  Psychiatric:        Mood and Affect: Mood normal.      UC Treatments / Results   Labs (all labs ordered are listed, but only abnormal results are displayed) Labs Reviewed  POCT URINALYSIS DIP (MANUAL ENTRY) - Abnormal; Notable for the following components:      Result Value   Leukocytes, UA Trace (*)    All other components within normal limits  URINE CULTURE    EKG   Radiology No results found.  Procedures Procedures (including critical care time)  Medications Ordered in UC Medications - No data to display  Initial Impression / Assessment and Plan / UC Course  I have reviewed the triage vital signs and the nursing notes.  Pertinent labs & imaging results that were available during my care of the patient were reviewed by me and considered in my medical decision making (see chart for details).     Urinary frequency Urine with trace leuks.  Will send for culture.  Will treat based on culture. Recommended push fluids Follow up as needed for continued or worsening symptoms  Final Clinical Impressions(s) / UC Diagnoses   Final diagnoses:  Urinary frequency     Discharge Instructions     We will send urine for culture.  We will hold off treating until we get the results.  Make sure you are drinking plenty of fluids Follow up as needed for  continued or worsening symptoms     ED Prescriptions    None     PDMP not reviewed this encounter.   Orvan July, NP 08/19/20 [phone]

## 2020-08-24 DIAGNOSIS — M12811 Other specific arthropathies, not elsewhere classified, right shoulder: Secondary | ICD-10-CM | POA: Diagnosis not present

## 2020-08-24 DIAGNOSIS — M79629 Pain in unspecified upper arm: Secondary | ICD-10-CM | POA: Diagnosis not present

## 2020-08-24 DIAGNOSIS — M25511 Pain in right shoulder: Secondary | ICD-10-CM | POA: Diagnosis not present

## 2020-08-25 ENCOUNTER — Ambulatory Visit (INDEPENDENT_AMBULATORY_CARE_PROVIDER_SITE_OTHER): Payer: PPO | Admitting: Obstetrics & Gynecology

## 2020-08-25 ENCOUNTER — Other Ambulatory Visit: Payer: Self-pay

## 2020-08-25 ENCOUNTER — Encounter: Payer: Self-pay | Admitting: Obstetrics & Gynecology

## 2020-08-25 VITALS — BP 130/80 | Ht 69.5 in | Wt 170.0 lb

## 2020-08-25 DIAGNOSIS — R102 Pelvic and perineal pain: Secondary | ICD-10-CM | POA: Diagnosis not present

## 2020-08-25 NOTE — Progress Notes (Signed)
Gynecology Pelvic Pain Evaluation   Chief Complaint:  Chief Complaint  Patient presents with  . Pelvic Pain    History of Present Illness:   Patient is a 69 y.o. G1P1001 who LMP was No LMP recorded. Patient is postmenopausal., presents today for a problem visit.  She complains of lower abdomina pressure more so than pain but that started about 2 weeks ago, and has improved these past 2 days (after making the appt).  She has urinary urgency with it.  She had UA at urgent care that was negative.  No bleeding, dicharge, nausea, change in bowel habits..   Her pain is localized to the suprapubic area, described as intermittent, began several weeks ago and its severity is described as mild. The pain radiates to the  Non-radiating. She has these associated symptoms which include urgency. Patient has these modifiers which include relaxation that make it better and unable to associate with any factor that make it worse.  Context includes: recent stress (death in family).  Previous evaluation: UA NEG. Prior Diagnosis: none. Previous Treatment: none.  PMHx: She  has a past medical history of Acid reflux, Coronary artery disease, Heart disease, Heart murmur, History of chicken pox, Hyperlipidemia, Hypertension, and Insomnia. Also,  has a past surgical history that includes Appendectomy; triple bypass; Hysteroscopy with D & C; Breast surgery; lipoma removed; Colonoscopy with propofol (N/A, 05/16/2016); Coronary artery bypass graft (05/01/2014); Esophagogastroduodenoscopy (egd) with propofol (N/A, 02/23/2017); and Breast excisional biopsy (Right)., family history includes Alzheimer's disease in her maternal aunt, maternal grandmother, maternal uncle, and mother; Breast cancer in an other family member; Breast cancer (age of onset: 7) in her sister; Heart disease in her father, mother, and another family member; Stroke in her father and mother.,  reports that she has never smoked. She has never used smokeless  tobacco. She reports that she does not drink alcohol and does not use drugs.  She has a current medication list which includes the following prescription(s): alpha-lipoic acid, amlodipine, ascorbic acid, aspirin ec, atorvastatin, calcium-vitamin d, carvedilol, cyanocobalamin, docusate calcium, hydrochlorothiazide, irbesartan, loratadine, fish oil, pantoprazole, pregabalin, ropinirole, syringe 3cc/25gx1", trazodone, and triamcinolone. Also, is allergic to valsartan.  Review of Systems  Constitutional: Negative for chills, fever and malaise/fatigue.  HENT: Negative for congestion, sinus pain and sore throat.   Eyes: Negative for blurred vision and pain.  Respiratory: Negative for cough and wheezing.   Cardiovascular: Negative for chest pain and leg swelling.  Gastrointestinal: Positive for abdominal pain. Negative for constipation, diarrhea, heartburn, nausea and vomiting.  Genitourinary: Positive for urgency. Negative for dysuria, frequency and hematuria.  Musculoskeletal: Negative for back pain, joint pain, myalgias and neck pain.  Skin: Negative for itching and rash.  Neurological: Negative for dizziness, tremors and weakness.  Endo/Heme/Allergies: Does not bruise/bleed easily.  Psychiatric/Behavioral: Negative for depression. The patient is not nervous/anxious and does not have insomnia.     Objective: BP 130/80   Ht 5' 9.5" (1.765 m)   Wt 170 lb (77.1 kg)   BMI 24.74 kg/m  Physical Exam Constitutional:      General: She is not in acute distress.    Appearance: She is well-developed.  Genitourinary:     Bladder, vagina, uterus and urethral meatus normal.     No vaginal erythema or bleeding.      Right Adnexa: not tender and no mass present.    Left Adnexa: not tender and no mass present.    No cervical motion tenderness, discharge, polyp or nabothian cyst.  Uterus is mobile.     Uterus is not enlarged.     No uterine mass detected.    Uterus exam comments: Small mobile,  mild prolapse.     Uterus is midaxial.     No urethral tenderness present.     Urethra exam comments: <30 degree qtip.     Pelvic exam was performed with patient supine.  HENT:     Head: Normocephalic and atraumatic.     Nose: Nose normal.  Abdominal:     General: There is no distension.     Palpations: Abdomen is soft.     Tenderness: There is no abdominal tenderness.  Musculoskeletal:        General: Normal range of motion.  Neurological:     Mental Status: She is alert and oriented to person, place, and time.     Cranial Nerves: No cranial nerve deficit.  Skin:    General: Skin is warm and dry.  Psychiatric:        Attention and Perception: Attention normal.        Mood and Affect: Mood and affect normal.        Speech: Speech normal.        Behavior: Behavior normal.        Thought Content: Thought content normal.        Judgment: Judgment normal.     Female chaperone present for pelvic portion of the physical exam  Assessment: 69 y.o. G1P1001 with pressure related to uterine mild prolapse or alternative etiology.  As improving, monitor for recurrence. Consider urology referral Stress related may be link Tx for uterine prolapse discussed. Too mild to really consider at this time.  Barnett Applebaum, MD, [facility], [facility] 08/25/2020  3:18 PM

## 2020-09-09 ENCOUNTER — Other Ambulatory Visit: Payer: Self-pay | Admitting: Internal Medicine

## 2020-09-12 NOTE — Telephone Encounter (Signed)
rx ok'd for lyrica #[id number] with 2 refills.   

## 2020-09-27 ENCOUNTER — Telehealth: Payer: Self-pay | Admitting: Internal Medicine

## 2020-09-27 NOTE — Telephone Encounter (Signed)
Patient sent my chart message having abdominal discomfort periodically started 08/11/20 had a UA completed and was negative , saw OBGYN and no abnormalities found, Patient stated the discomfort is generalized all over and the only symptom, no nausea, and afebrile. Patient has had some constipation, BM every 2-3 days, Scheduled patient in office for 09/30/20 at 12 and advised if she developed nausea , vomiting , or pain worsened to be evaluated sooner, or any fevers.

## 2020-09-30 ENCOUNTER — Ambulatory Visit (INDEPENDENT_AMBULATORY_CARE_PROVIDER_SITE_OTHER): Payer: PPO | Admitting: Internal Medicine

## 2020-09-30 ENCOUNTER — Encounter: Payer: Self-pay | Admitting: Internal Medicine

## 2020-09-30 ENCOUNTER — Other Ambulatory Visit: Payer: Self-pay

## 2020-09-30 DIAGNOSIS — F419 Anxiety disorder, unspecified: Secondary | ICD-10-CM | POA: Diagnosis not present

## 2020-09-30 DIAGNOSIS — R109 Unspecified abdominal pain: Secondary | ICD-10-CM | POA: Diagnosis not present

## 2020-09-30 DIAGNOSIS — I10 Essential (primary) hypertension: Secondary | ICD-10-CM

## 2020-09-30 NOTE — Progress Notes (Signed)
[name], female   DOB: 06-Nov-1950, 70 y.o.   MRN: [id number]   Subjective:    [name], female    DOB: 03-23-1951, 70 y.o.   MRN: [id number]  HPI This visit occurred during the SARS-CoV-2 public health emergency.  Safety protocols were in place, including screening questions prior to the visit, additional usage of staff PPE, and extensive cleaning of exam room while observing appropriate contact time as indicated for disinfecting solutions.  Patient here for work in appt. Seen initially 08/18/20 - for urinary urgency, frequency and lower abdominal pressure.  Urine culture negative for infection. Pain persistent.  Evaluated by gyn 08/25/20.  Note reviewed. GYN felt pressure related to mild uterine prolapse.  Was improving.  Discussed possible urology referral.  Has been having persistent issues.  Described as aching and fullness.  Started taking miralax and colace. Felt some constipation - hard stool.  Bowels moving better recently and discomfort better.  No significant pain now.  Discussed taking mialax on a regular basis.  She also reports increased stress.  Mother-n-law passed.  Has good support.  Overall appears to be handling things relatively well.  Eating.  No nausea or vomiting.    Past Medical History:  Diagnosis Date  . Acid reflux   . Coronary artery disease   . 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 Heart murmur   . History of chicken pox   . Hyperlipidemia   . Hypertension   . Insomnia    Past Surgical History:  Procedure Laterality Date  . APPENDECTOMY    . BREAST EXCISIONAL BIOPSY Right    negative over 5 years ago- neg  . BREAST SURGERY     Biopsy  . COLONOSCOPY WITH PROPOFOL N/A 05/16/2016   Procedure: COLONOSCOPY WITH PROPOFOL;  Surgeon: [name], MD;  Location: [facility];  Service: Endoscopy;  Laterality: N/A;  . CORONARY ARTERY BYPASS GRAFT  05/01/2014   3 vessel, [facility]  . ESOPHAGOGASTRODUODENOSCOPY  (EGD) WITH PROPOFOL N/A 02/23/2017   Procedure: ESOPHAGOGASTRODUODENOSCOPY (EGD) WITH PROPOFOL;  Surgeon: [name], MD;  Location: [facility];  Service: Endoscopy;  Laterality: N/A;  . HYSTEROSCOPY WITH D & C    . lipoma removed     removed from forehead  . triple bypass     Family History  Problem Relation Age of Onset  . Breast cancer Other        maternal great aunt  . Heart disease Other        multiple family members  . Heart disease Mother   . Stroke Mother   . Alzheimer's disease Mother   . Heart disease Father   . Stroke Father   . Breast cancer Sister 70  . Alzheimer's disease Maternal Aunt   . Alzheimer's disease Maternal Uncle   . Alzheimer's disease Maternal Grandmother   . Colon cancer Neg Hx   . Diabetes Neg Hx   . Ovarian cancer Neg Hx    Social History   Socioeconomic History  . Marital status: Married    Spouse name: Not on file  . Number of children: Not on file  . Years of education: Not on file  . Highest education level: Not on file  Occupational History  . Not on file  Tobacco Use  . Smoking status: Never Smoker  . Smokeless tobacco: Never Used  Vaping Use  . Vaping Use: Never used  Substance and Sexual  Activity  . Alcohol use: Never    Alcohol/week: 0.0 standard drinks  . Drug use: No  . Sexual activity: Yes    Birth control/protection: Post-menopausal  Other Topics Concern  . Not on file  Social History Narrative  . Not on file   Social Determinants of Health   Financial Resource Strain: Low Risk   . Difficulty of Paying Living Expenses: Not hard at all  Food Insecurity: No Food Insecurity  . Worried About Charity fundraiser in the Last Year: Never true  . Ran Out of Food in the Last Year: Never true  Transportation Needs: No Transportation Needs  . Lack of Transportation (Medical): No  . Lack of Transportation (Non-Medical): No  Physical Activity: Sufficiently Active  . Days of Exercise per Week: 3 days  . Minutes  of Exercise per Session: 50 min  Stress: No Stress Concern Present  . Feeling of Stress : Not at all  Social Connections: Unknown  . Frequency of Communication with Friends and Family: More than three times a week  . Frequency of Social Gatherings with Friends and Family: More than three times a week  . Attends Religious Services: More than 4 times per year  . Active Member of Clubs or Organizations: Yes  . Attends Archivist Meetings: More than 4 times per year  . Marital Status: Not on file    Outpatient Encounter Medications as of 09/30/2020  Medication Sig  . Alpha-Lipoic Acid 100 MG CAPS Take by mouth.  Marland Kitchen amLODipine (NORVASC) 10 MG tablet Take 10 mg by mouth daily.  Marland Kitchen ascorbic acid (VITAMIN C) 1000 MG tablet Take by mouth.  Marland Kitchen aspirin EC 81 MG tablet Take 81 mg by mouth daily.  Marland Kitchen atorvastatin (LIPITOR) 40 MG tablet Take by mouth.  . calcium-vitamin D (OSCAL WITH D) 500-200 MG-UNIT TABS tablet Take by mouth.  . carvedilol (COREG) 6.25 MG tablet Take 6.25 mg by mouth 2 (two) times daily.  . cyanocobalamin 1000 MCG tablet Take 1,000 mcg by mouth daily.  Mariane Baumgarten Calcium (STOOL SOFTENER PO) Take by mouth.  . hydrochlorothiazide (HYDRODIURIL) 25 MG tablet TAKE 1 TABLET (25 MG TOTAL) BY MOUTH ONCE DAILY.  Marland Kitchen irbesartan (AVAPRO) 300 MG tablet Take 300 mg by mouth daily.  . Omega-3 Fatty Acids (FISH OIL) 1000 MG CAPS Take 1,200 mg by mouth daily.  . pantoprazole (PROTONIX) 40 MG tablet TAKE 1 TABLET (40 MG TOTAL) BY MOUTH DAILY. TAKE 30 MINUTES BEFORE BREAKFAST  . pregabalin (LYRICA) 75 MG capsule TAKE 1-2 CAPSULES BY MOUTH EVERY MORNING AND TAKE TWO CAPSULES BY MOUTH EVERY EVENING  . rOPINIRole (REQUIP) 0.25 MG tablet Take 0.25 mg by mouth at bedtime.  . traZODone (DESYREL) 50 MG tablet TAKE 1-2 TABLETS (50-100 MG TOTAL) BY MOUTH AT BEDTIME AS NEEDED.  Marland Kitchen triamcinolone cream (KENALOG) 0.1 % Apply 1 application topically 2 (two) times daily. Use for 7 days.  . [DISCONTINUED]  cyanocobalamin (,VITAMIN B-12,) 1000 MCG/ML injection INJECT 1 ML (1,000 MCG TOTAL) INTO THE MUSCLE EVERY 30 (THIRTY) DAYS.  . [DISCONTINUED] loratadine (CLARITIN) 10 MG tablet TAKE 1 TABLET BY MOUTH EVERY DAY  . [DISCONTINUED] Syringe/Needle, Disp, (SYRINGE 3CC/25GX1") 25G X 1" 3 ML MISC Use as directed with b12 injections.   No facility-administered encounter medications on file as of 09/30/2020.    Review of Systems  Constitutional: Negative for appetite change and unexpected weight change.  HENT: Negative for congestion and sinus pressure.   Respiratory: Negative for cough, chest  tightness and shortness of breath.   Cardiovascular: Negative for chest pain, palpitations and leg swelling.  Gastrointestinal: Positive for constipation. Negative for diarrhea, nausea and vomiting.       Lower abdominal pressure better.   Genitourinary: Negative for difficulty urinating and dysuria.       Previous urgency.  Denies currently.    Musculoskeletal: Negative for joint swelling and myalgias.  Skin: Negative for color change and rash.  Neurological: Negative for dizziness, light-headedness and headaches.  Psychiatric/Behavioral: Negative for agitation and dysphoric mood.       Objective:    Physical Exam Vitals reviewed.  Constitutional:      General: She is not in acute distress.    Appearance: Normal appearance.  HENT:     Head: Normocephalic and atraumatic.     Right Ear: External ear normal.     Left Ear: External ear normal.     Mouth/Throat:     Mouth: Oropharynx is clear and moist.  Eyes:     General: No scleral icterus.       Right eye: No discharge.        Left eye: No discharge.     Conjunctiva/sclera: Conjunctivae normal.  Neck:     Thyroid: No thyromegaly.  Cardiovascular:     Rate and Rhythm: Normal rate and regular rhythm.  Pulmonary:     Effort: No respiratory distress.     Breath sounds: Normal breath sounds. No wheezing.  Abdominal:     General: Bowel sounds are  normal.     Palpations: Abdomen is soft.     Tenderness: There is no abdominal tenderness.     Comments: No tenderness to palpation.    Musculoskeletal:        General: No swelling, tenderness or edema.     Cervical back: Neck supple. No tenderness.  Lymphadenopathy:     Cervical: No cervical adenopathy.  Skin:    Findings: No erythema or rash.  Neurological:     Mental Status: She is alert.  Psychiatric:        Mood and Affect: Mood normal.        Behavior: Behavior normal.     BP 126/70   Pulse 71   Temp 98.3 F (36.8 C) (Oral)   Resp 16   Wt 170 lb 12.8 oz (77.5 kg)   SpO2 99%   BMI 24.86 kg/m  Wt Readings from Last 3 Encounters:  09/30/20 170 lb 12.8 oz (77.5 kg)  08/25/20 170 lb (77.1 kg)  08/18/20 165 lb (74.8 kg)     Lab Results  Component Value Date   WBC 8.4 11/26/2019   HGB 14.0 11/26/2019   HCT 41.4 11/26/2019   PLT 183 11/26/2019   GLUCOSE 85 07/12/2020   CHOL 97 07/12/2020   TRIG 76.0 07/12/2020   HDL 36.30 (L) 07/12/2020   LDLCALC 45 07/12/2020   ALT 19 07/12/2020   AST 18 07/12/2020   NA 142 07/12/2020   K 4.0 07/12/2020   CL 104 07/12/2020   CREATININE 0.79 07/12/2020   BUN 13 07/12/2020   CO2 31 07/12/2020   TSH 3.71 03/09/2020       Assessment & Plan:   Problem List Items Addressed This Visit    Abdominal discomfort    Saw gyn. Felt possibly to be related to mild uterine prolapse.  Better now with bowel movements.  No significant pain now.  Discussed further w/up.  Start taking miralax daily.  Can continue colace.  Keep bowels moving regularly.  Follow symptoms.  Will notify me if persistent problems.         Anxiety    Increased stress recently as outlined.  Has good support.  Follow.       Essential hypertension    Continue on coreg, amlodipine, hctz and avapro.  Follow pressures.  Follow metabolic panel.           Einar Pheasant, MD

## 2020-10-03 ENCOUNTER — Encounter: Payer: Self-pay | Admitting: Internal Medicine

## 2020-10-03 NOTE — Assessment & Plan Note (Signed)
Continue on coreg, amlodipine, hctz and avapro.  Follow pressures.  Follow metabolic panel.  

## 2020-10-03 NOTE — Assessment & Plan Note (Signed)
Saw gyn. Felt possibly to be related to mild uterine prolapse.  Better now with bowel movements.  No significant pain now.  Discussed further w/up.  Start taking miralax daily.  Can continue colace.  Keep bowels moving regularly.  Follow symptoms.  Will notify me if persistent problems.

## 2020-10-03 NOTE — Assessment & Plan Note (Signed)
Increased stress recently as outlined.  Has good support.  Follow.

## 2020-10-04 ENCOUNTER — Ambulatory Visit: Payer: PPO | Admitting: Internal Medicine

## 2020-10-08 ENCOUNTER — Ambulatory Visit
Admission: EM | Admit: 2020-10-08 | Discharge: 2020-10-08 | Disposition: A | Discharge status: Home / Self Care | Payer: PPO | Attending: Emergency Medicine | Admitting: Emergency Medicine

## 2020-10-08 ENCOUNTER — Encounter: Payer: Self-pay | Admitting: Internal Medicine

## 2020-10-08 ENCOUNTER — Other Ambulatory Visit: Payer: Self-pay

## 2020-10-08 DIAGNOSIS — I10 Essential (primary) hypertension: Secondary | ICD-10-CM

## 2020-10-08 MED ORDER — HYDRALAZINE HCL 10 MG PO TABS: 10.0000 mg | ORAL_TABLET | Freq: Three times a day (TID) | ORAL | 2 refills | Status: DC

## 2020-10-08 NOTE — Telephone Encounter (Signed)
Called and spoke to [employer] Alexandra Mendoza states that her Carvedilol, amlodipine, avapro, and HCTZ dosages are correct and that her current pulse is running at 61. Her blood pressure on 10/07/2020 came with a pulse of 60. [name] scheduled for a Follow up for blood pressure on Monday 10/11/2020.

## 2020-10-08 NOTE — Telephone Encounter (Signed)
Please call her and confirm she is doing ok other than elevated blood pressure.  Also, would like to know pulse rate with blood pressures.  Confirm she is taking carvedilol 6.25mg  bid, amlodipine 10mg  q day, avapro 300mg  q day and hctz 25mg  q day.  Pending review of pulse rate, I am going to adjust her medication, but just need to know pulse rate if possible before changing.  Thanks

## 2020-10-08 NOTE — ED Triage Notes (Signed)
Patient states that she has been having elevated BP over the last few weeks. States that last night was 194/94 and today was 200/84. States that she spoke with Dr. Nicki Reaper and she sent in hydralazine tonight. States that she was told to come here and have it checked.

## 2020-10-08 NOTE — Telephone Encounter (Signed)
Spoke to pt. No severe headache.  No dizziness or light headedness.  No chest pain or sob.  Anxious about her blood pressure.  Discussed her current medication and doses.  Discussed unable to increase coreg due to pulse 60.  Will add hydralazine 10mg  tid.  Pt agreeable to start.  Discussed that if blood pressure remained elevated 200, she needed to be seen.  Hydralazine sent in to pharmacy.  Recheck blood pressure 220.  Pt instructed on need for evaluation given persistent elevation.  She was agreeable.

## 2020-10-08 NOTE — ED Provider Notes (Signed)
[facility]    CSN: [id number] Arrival date & time: 10/08/20  [id number]      History   Chief Complaint Chief Complaint  Patient presents with  . Hypertension    HPI Alexandra Mendoza is a 70 y.o. female.   HPI   70 year old female here for evaluation of elevated blood pressure.  Patient reports that she has been noticing her blood pressure trend up over the last several weeks.  Last night her blood pressure was 194/94 and then today it was 200/84.  Patient spoke to her primary care physician, Dr. Nicki Reaper, who has ordered hydralazine but also recommended the patient come to urgent care for an evaluation.  Patient states that she has had a slight nagging headache but she denies dizziness, chest pain, sweating, nausea, or shortness of breath.  Patient is concerned that a turmeric supplement that she had recently started, that was mixed with ginger, might be raising her blood pressure.  Past Medical History:  Diagnosis Date  . Acid reflux   . Coronary artery disease   . 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 Heart murmur   . History of chicken pox   . Hyperlipidemia   . Hypertension   . Insomnia     Patient Active Problem List   Diagnosis Date Noted  . Neck fullness 07/19/2020  . Right arm pain 07/19/2020  . B12 deficiency 03/21/2020  . Rash 11/09/2019  . Hypercalcemia 06/29/2019  . Neuropathy 02/15/2018  . Vaginal atrophy 06/12/2017  . Menopause 06/12/2017  . Osteopenia 06/12/2017  . Heartburn   . Chest pain 01/08/2017  . Palpitations 11/28/2016  . Headache 07/07/2016  . Special screening for malignant neoplasms, colon   . Benign neoplasm of transverse colon   . Family history of breast cancer in first degree relative 10/25/2015  . Fibrocystic breast changes of both breasts 10/25/2015  . Abdominal discomfort 08/22/2015  . Abnormal vaginal Pap smear 05/18/2015  . Adaptation reaction 05/05/2015  . Anxiety 05/05/2015  . History of chicken pox  05/05/2015  . HLD (hyperlipidemia) 05/05/2015  . 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 Osteoarthrosis 05/05/2015  . Adiposity 05/05/2015  . H/O coronary artery bypass surgery 05/05/2015  . CAD (coronary artery disease) 03/14/2015  . Essential hypertension 03/14/2015  . Hypercholesterolemia 03/14/2015  . Health care maintenance 03/14/2015  . Insomnia 03/08/2015  . Esophagitis, reflux 12/11/2014  . History of atrial fibrillation 06/29/2014  . Arteriosclerosis of bypass graft of coronary artery 06/01/2014  . Billowing mitral valve 06/01/2014  . 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 APPENDECTOMY    . BREAST EXCISIONAL BIOPSY Right    negative over 5 years ago- neg  . BREAST SURGERY     Biopsy  . COLONOSCOPY WITH PROPOFOL N/A 05/16/2016   Procedure: COLONOSCOPY WITH PROPOFOL;  Surgeon: [name], MD;  Location: [facility];  Service: Endoscopy;  Laterality: N/A;  . CORONARY ARTERY BYPASS GRAFT  05/01/2014   3 vessel, [facility]  . ESOPHAGOGASTRODUODENOSCOPY (EGD) WITH PROPOFOL N/A 02/23/2017   Procedure: ESOPHAGOGASTRODUODENOSCOPY (EGD) WITH PROPOFOL;  Surgeon: [name], MD;  Location: [facility];  Service: Endoscopy;  Laterality: N/A;  . HYSTEROSCOPY WITH D & C    . lipoma removed     removed from forehead  . triple bypass      OB History    [place]  1   Para  1   Term  1   Preterm      AB      Living  1     SAB      IAB      Ectopic      Multiple      Live Births  1            Home Medications    Prior to Admission medications   Medication Sig Start Date End Date Taking? Authorizing Provider  Alpha-Lipoic Acid 100 MG CAPS Take by mouth.   Yes [provider]  amLODipine (NORVASC) 10 MG tablet Take 10 mg by mouth daily. 03/08/15  Yes [provider]  ascorbic acid (VITAMIN C) 1000 MG tablet Take by mouth.    Yes [provider]  aspirin EC 81 MG tablet Take 81 mg by mouth daily. 10/04/12  Yes [provider]  atorvastatin (LIPITOR) 40 MG tablet Take by mouth. 03/22/20 03/22/21 Yes [provider]  calcium-vitamin D (OSCAL WITH D) 500-200 MG-UNIT TABS tablet Take by mouth.   Yes [provider]  carvedilol (COREG) 6.25 MG tablet Take 6.25 mg by mouth 2 (two) times daily. 02/11/20  Yes [provider]  cyanocobalamin 1000 MCG tablet Take 1,000 mcg by mouth daily.   Yes [provider]  Docusate Calcium (STOOL SOFTENER PO) Take by mouth.   Yes [provider]  hydrALAZINE (APRESOLINE) 10 MG tablet Take 1 tablet (10 mg total) by mouth 3 (three) times daily. 10/08/20  Yes Einar Pheasant, MD  hydrochlorothiazide (HYDRODIURIL) 25 MG tablet TAKE 1 TABLET (25 MG TOTAL) BY MOUTH ONCE DAILY. 03/15/16  Yes [provider]  irbesartan (AVAPRO) 300 MG tablet Take 300 mg by mouth daily. 06/24/20  Yes [provider]  Omega-3 Fatty Acids (FISH OIL) 1000 MG CAPS Take 1,200 mg by mouth daily.   Yes [provider]  pantoprazole (PROTONIX) 40 MG tablet TAKE 1 TABLET (40 MG TOTAL) BY MOUTH DAILY. TAKE 30 MINUTES BEFORE BREAKFAST 07/14/20  Yes Einar Pheasant, MD  pregabalin (LYRICA) 75 MG capsule TAKE 1-2 CAPSULES BY MOUTH EVERY MORNING AND TAKE TWO CAPSULES BY MOUTH EVERY EVENING 09/12/20  Yes Einar Pheasant, MD  rOPINIRole (REQUIP) 0.25 MG tablet Take 0.25 mg by mouth at bedtime. 01/29/20  Yes [provider]  traZODone (DESYREL) 50 MG tablet TAKE 1-2 TABLETS (50-100 MG TOTAL) BY MOUTH AT BEDTIME AS NEEDED. 07/14/20  Yes Einar Pheasant, MD  triamcinolone cream (KENALOG) 0.1 % Apply 1 application topically 2 (two) times daily. Use for 7 days. 08/20/18  Yes Guse, Jacquelynn Cree, FNP    Family History Family History  Problem Relation Age of Onset  . Breast cancer Other        maternal great aunt  . Heart disease Other        multiple  family members  . Heart disease Mother   . Stroke Mother   . Alzheimer's disease Mother   . Heart disease Father   . Stroke Father   . Breast cancer Sister 88  . Alzheimer's disease Maternal Aunt   . Alzheimer's disease Maternal Uncle   . Alzheimer's disease Maternal Grandmother   . Colon cancer Neg Hx   . Diabetes Neg Hx   . Ovarian cancer Neg Hx     Social History Social History   Tobacco Use  . Smoking status: Never Smoker  . Smokeless tobacco: Never Used  Vaping Use  .  Vaping Use: Never used  Substance Use Topics  . Alcohol use: Never    Alcohol/week: 0.0 standard drinks  . Drug use: No     Allergies   Valsartan   Review of Systems Review of Systems  Constitutional: Negative for activity change, appetite change and diaphoresis.  Respiratory: Negative for shortness of breath.   Cardiovascular: Negative for chest pain.  Skin: Negative for rash.  Neurological: Positive for headaches. Negative for dizziness and syncope.  Psychiatric/Behavioral: Negative.      Physical Exam Triage Vital Signs ED Triage Vitals  Enc Vitals Group     BP 10/08/20 1911 (!) 179/80     Pulse Rate 10/08/20 1911 83     Resp 10/08/20 1911 18     Temp 10/08/20 1911 98.4 F (36.9 C)     Temp Source 10/08/20 1911 Oral     SpO2 10/08/20 1911 98 %     Weight 10/08/20 1908 168 lb (76.2 kg)     Height 10/08/20 1908 5\' 10"  (1.778 m)     Head Circumference --      Peak Flow --      Pain Score 10/08/20 1908 0     Pain Loc --      Pain Edu? --      Excl. in [place]? --    No data found.  Updated Vital Signs BP (!) 179/80 (BP Location: Left Arm)   Pulse 83   Temp 98.4 F (36.9 C) (Oral)   Resp 18   Ht 5\' 10"  (1.778 m)   Wt 168 lb (76.2 kg)   SpO2 98%   BMI 24.11 kg/m   Visual Acuity Right Eye Distance:   Left Eye Distance:   Bilateral Distance:    Right Eye Near:   Left Eye Near:    Bilateral Near:     Physical Exam Vitals and nursing note reviewed.  Constitutional:       General: She is not in acute distress.    Appearance: Normal appearance. She is not ill-appearing.  HENT:     Head: Normocephalic and atraumatic.  Cardiovascular:     Rate and Rhythm: Normal rate and regular rhythm.     Pulses: Normal pulses.     Heart sounds: Normal heart sounds. No murmur heard. No gallop.   Pulmonary:     Effort: Pulmonary effort is normal.     Breath sounds: Normal breath sounds. No wheezing, rhonchi or rales.  Skin:    General: Skin is warm and dry.     Capillary Refill: Capillary refill takes less than 2 seconds.     Findings: No erythema or rash.  Neurological:     General: No focal deficit present.     Mental Status: She is alert and oriented to person, place, and time.  Psychiatric:        Mood and Affect: Mood normal.        Behavior: Behavior normal.        Thought Content: Thought content normal.        Judgment: Judgment normal.      UC Treatments / Results  Labs (all labs ordered are listed, but only abnormal results are displayed) Labs Reviewed - No data to display  EKG   Radiology No results found.  Procedures Procedures (including critical care time)  Medications Ordered in UC Medications - No data to display  Initial Impression / Assessment and Plan / UC Course  I have reviewed the triage vital signs  and the nursing notes.  Pertinent labs & imaging results that were available during my care of the patient were reviewed by me and considered in my medical decision making (see chart for details).   Is here for evaluation of her blood pressure at the recommendation of her physician.  Patient has been experiencing an elevation of her blood pressure in the last several weeks and tonight she was quite alarmed by her blood pressure reading of 200/84.  She spoke to her primary care physician who has ordered hydralazine which the patient has not started yet.  Patient's PCP also recommended she come to urgent care for further evaluation of her  blood pressure.  Patient's blood pressure has come down and is currently 179/80.  Patient has no chest pain, dizziness, shortness of breath, or nausea.  Patient states that before she had to have her heart bypass several years ago that her only symptom was elevated blood pressure.  Will order EKG for cardiac evaluation.  EKG shows normal sinus rhythm with ventricular rate of 75, PR 148, QRS 86.  No changes compared to EKG from November 26, 2019.  Normal axis.  We will discharge patient home with diagnosis of hypertension, have her start her hydralazine as prescribed.  Have reviewed with patient proper technique for taking her blood pressure.  Final Clinical Impressions(s) / UC Diagnoses   Final diagnoses:  Hypertension, unspecified type     Discharge Instructions     Start hydralazine as ordered by your primary care provider.  Check your blood pressure as we discussed with resting in a relaxed state for 30 minutes prior to taking her blood pressure.  When you take your blood pressure make sure you are sitting with your feet flat on the floor, your bladder empty, and the arm you are taking your blood pressure and at heart level for 5 minutes.  Then take your blood pressure and record the reading.  Keep your follow-up appointment with Dr. Nicki Reaper on Monday as previously scheduled.    ED Prescriptions    None     PDMP not reviewed this encounter.   Margarette Canada, NP 10/08/20 2003

## 2020-10-08 NOTE — Discharge Instructions (Addendum)
Start hydralazine as ordered by your primary care provider.  Check your blood pressure as we discussed with resting in a relaxed state for 30 minutes prior to taking her blood pressure.  When you take your blood pressure make sure you are sitting with your feet flat on the floor, your bladder empty, and the arm you are taking your blood pressure and at heart level for 5 minutes.  Then take your blood pressure and record the reading.  Keep your follow-up appointment with Dr. Nicki Reaper on Monday as previously scheduled.

## 2020-10-11 ENCOUNTER — Other Ambulatory Visit: Payer: Self-pay

## 2020-10-11 ENCOUNTER — Ambulatory Visit (INDEPENDENT_AMBULATORY_CARE_PROVIDER_SITE_OTHER): Payer: PPO | Admitting: Internal Medicine

## 2020-10-11 ENCOUNTER — Encounter: Payer: Self-pay | Admitting: Internal Medicine

## 2020-10-11 VITALS — BP 150/78 | HR 67 | Temp 98.0°F | Ht 70.0 in | Wt 168.8 lb

## 2020-10-11 DIAGNOSIS — I10 Essential (primary) hypertension: Secondary | ICD-10-CM | POA: Diagnosis not present

## 2020-10-11 DIAGNOSIS — I2510 Atherosclerotic heart disease of native coronary artery without angina pectoris: Secondary | ICD-10-CM

## 2020-10-11 DIAGNOSIS — E7800 Pure hypercholesterolemia, unspecified: Secondary | ICD-10-CM

## 2020-10-11 DIAGNOSIS — G629 Polyneuropathy, unspecified: Secondary | ICD-10-CM | POA: Diagnosis not present

## 2020-10-11 DIAGNOSIS — F419 Anxiety disorder, unspecified: Secondary | ICD-10-CM | POA: Diagnosis not present

## 2020-10-11 LAB — BASIC METABOLIC PANEL
BUN: 12 mg/dL (ref 6–23)
CO2: 32 mEq/L (ref 19–32)
Calcium: 10.6 mg/dL — ABNORMAL HIGH (ref 8.4–10.5)
Chloride: 100 mEq/L (ref 96–112)
Creatinine, Ser: 0.77 mg/dL (ref 0.40–1.20)
GFR: 78.73 mL/min (ref >60.00)
Glucose, Bld: 89 mg/dL (ref 70–99)
Potassium: 3.6 mEq/L (ref 3.5–5.1)
Sodium: 138 mEq/L (ref 135–145)

## 2020-10-11 NOTE — Progress Notes (Signed)
[name], female   DOB: 1951/05/18, 69 y.o.   MRN: [id number]   Subjective:    [name], female    DOB: [birth year], 70 y.o.   MRN: [id number]  HPI This visit occurred during the SARS-CoV-2 public health emergency.  Safety protocols were in place, including screening questions prior to the visit, additional usage of staff PPE, and extensive cleaning of exam room while observing appropriate contact time as indicated for disinfecting solutions.  Patient here for work in appt.  Work in to discuss her blood pressure. Elevated recently.  Evaluated urgent care.  Work up unrevealing.  Since her visit, blood pressure is doing some better.  I started her on hydralazine.  Tolerating. Discussed her medication and timing of medication.  No chest pain.  Breathing stable.  Eating.  No nausea or vomiting.  Bowels moving.  Increased stress.  Appears to be handling things relatively well.    Past Medical History:  Diagnosis Date  . Acid reflux   . Coronary artery disease   . 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 Heart murmur   . History of chicken pox   . Hyperlipidemia   . Hypertension   . Insomnia    Past Surgical History:  Procedure Laterality Date  . APPENDECTOMY    . BREAST EXCISIONAL BIOPSY Right    negative over 5 years ago- neg  . BREAST SURGERY     Biopsy  . COLONOSCOPY WITH PROPOFOL N/A 05/16/2016   Procedure: COLONOSCOPY WITH PROPOFOL;  Surgeon: [name], MD;  Location: [facility];  Service: Endoscopy;  Laterality: N/A;  . CORONARY ARTERY BYPASS GRAFT  05/01/2014   3 vessel, [facility]  . ESOPHAGOGASTRODUODENOSCOPY (EGD) WITH PROPOFOL N/A 02/23/2017   Procedure: ESOPHAGOGASTRODUODENOSCOPY (EGD) WITH PROPOFOL;  Surgeon: [name], MD;  Location: [place];  Service: Endoscopy;  Laterality: N/A;  . HYSTEROSCOPY WITH D & C    . lipoma removed     removed from forehead  . triple bypass     Family History  Problem  Relation Age of Onset  . Breast cancer Other        maternal great aunt  . Heart disease Other        multiple family members  . Heart disease Mother   . Stroke Mother   . Alzheimer's disease Mother   . Heart disease Father   . Stroke Father   . Breast cancer Sister 54  . Alzheimer's disease Maternal Aunt   . Alzheimer's disease Maternal Uncle   . Alzheimer's disease Maternal Grandmother   . Colon cancer Neg Hx   . Diabetes Neg Hx   . Ovarian cancer Neg Hx    Social History   Socioeconomic History  . Marital status: Married    Spouse name: Not on file  . Number of children: Not on file  . Years of education: Not on file  . Highest education level: Not on file  Occupational History  . Not on file  Tobacco Use  . Smoking status: Never Smoker  . Smokeless tobacco: Never Used  Vaping Use  . Vaping Use: Never used  Substance and Sexual Activity  . Alcohol use: Never    Alcohol/week: 0.0 standard drinks  . Drug use: No  . Sexual activity: Yes    Birth control/protection: Post-menopausal  Other Topics Concern  . Not on file  Social History Narrative  . Not on file  Social Determinants of Health   Financial Resource Strain: Low Risk   . Difficulty of Paying Living Expenses: Not hard at all  Food Insecurity: No Food Insecurity  . Worried About Charity fundraiser in the Last Year: Never true  . Ran Out of Food in the Last Year: Never true  Transportation Needs: No Transportation Needs  . Lack of Transportation (Medical): No  . Lack of Transportation (Non-Medical): No  Physical Activity: Sufficiently Active  . Days of Exercise per Week: 3 days  . Minutes of Exercise per Session: 50 min  Stress: No Stress Concern Present  . Feeling of Stress : Not at all  Social Connections: Unknown  . Frequency of Communication with Friends and Family: More than three times a week  . Frequency of Social Gatherings with Friends and Family: More than three times a week  . Attends  Religious Services: More than 4 times per year  . Active Member of Clubs or Organizations: Yes  . Attends Archivist Meetings: More than 4 times per year  . Marital Status: Not on file    Outpatient Encounter Medications as of 10/11/2020  Medication Sig  . Alpha-Lipoic Acid 100 MG CAPS Take 600 mg by mouth daily.  Marland Kitchen amLODipine (NORVASC) 10 MG tablet Take 10 mg by mouth daily.  Marland Kitchen ascorbic acid (VITAMIN C) 1000 MG tablet Take by mouth.  Marland Kitchen aspirin EC 81 MG tablet Take 81 mg by mouth daily.  Marland Kitchen atorvastatin (LIPITOR) 40 MG tablet Take by mouth.  . calcium-vitamin D (OSCAL WITH D) 500-200 MG-UNIT TABS tablet Take by mouth.  . carvedilol (COREG) 6.25 MG tablet Take 6.25 mg by mouth 2 (two) times daily.  . cyanocobalamin 1000 MCG tablet Take 1,000 mcg by mouth daily.  Mariane Baumgarten Calcium (STOOL SOFTENER PO) Take by mouth.  . hydrALAZINE (APRESOLINE) 10 MG tablet Take 1 tablet (10 mg total) by mouth 3 (three) times daily.  . hydrochlorothiazide (HYDRODIURIL) 25 MG tablet TAKE 1 TABLET (25 MG TOTAL) BY MOUTH ONCE DAILY.  Marland Kitchen irbesartan (AVAPRO) 300 MG tablet Take 300 mg by mouth daily.  . Omega-3 Fatty Acids (FISH OIL) 1000 MG CAPS Take 1,200 mg by mouth daily.  . pantoprazole (PROTONIX) 40 MG tablet TAKE 1 TABLET (40 MG TOTAL) BY MOUTH DAILY. TAKE 30 MINUTES BEFORE BREAKFAST  . pregabalin (LYRICA) 75 MG capsule TAKE 1-2 CAPSULES BY MOUTH EVERY MORNING AND TAKE TWO CAPSULES BY MOUTH EVERY EVENING  . rOPINIRole (REQUIP) 0.25 MG tablet Take 0.25 mg by mouth at bedtime.  . traZODone (DESYREL) 50 MG tablet TAKE 1-2 TABLETS (50-100 MG TOTAL) BY MOUTH AT BEDTIME AS NEEDED.  . [DISCONTINUED] triamcinolone cream (KENALOG) 0.1 % Apply 1 application topically 2 (two) times daily. Use for 7 days.   No facility-administered encounter medications on file as of 10/11/2020.    Review of Systems  Constitutional: Negative for appetite change and unexpected weight change.  HENT: Negative for congestion and  sinus pressure.   Respiratory: Negative for cough, chest tightness and shortness of breath.   Cardiovascular: Negative for chest pain, palpitations and leg swelling.  Gastrointestinal: Negative for abdominal pain, diarrhea, nausea and vomiting.  Genitourinary: Negative for difficulty urinating and dysuria.  Musculoskeletal: Negative for joint swelling and myalgias.  Skin: Negative for color change and rash.  Neurological: Negative for dizziness, light-headedness and headaches.  Psychiatric/Behavioral: Negative for agitation and dysphoric mood.       Increased stress as outlined.  Objective:    Physical Exam Vitals reviewed.  Constitutional:      General: Alexandra Mendoza is not in acute distress.    Appearance: Normal appearance.  HENT:     Head: Normocephalic and atraumatic.     Right Ear: External ear normal.     Left Ear: External ear normal.     Mouth/Throat:     Mouth: Oropharynx is clear and moist.  Eyes:     General: No scleral icterus.       Right eye: No discharge.        Left eye: No discharge.     Conjunctiva/sclera: Conjunctivae normal.  Neck:     Thyroid: No thyromegaly.  Cardiovascular:     Rate and Rhythm: Normal rate and regular rhythm.  Pulmonary:     Effort: No respiratory distress.     Breath sounds: Normal breath sounds. No wheezing.  Abdominal:     General: Bowel sounds are normal.     Palpations: Abdomen is soft.     Tenderness: There is no abdominal tenderness.  Musculoskeletal:        General: No swelling, tenderness or edema.     Cervical back: Neck supple. No tenderness.  Lymphadenopathy:     Cervical: No cervical adenopathy.  Skin:    Findings: No erythema or rash.  Neurological:     Mental Status: Alexandra Mendoza is alert.  Psychiatric:        Mood and Affect: Mood normal.        Behavior: Behavior normal.     BP (!) 150/78 (BP Location: Left Arm, Patient Position: Sitting, Cuff Size: Normal)   Pulse 67   Temp 98 F (36.7 C) (Oral)   Ht 5\' 10"   (1.778 m)   Wt 168 lb 12.8 oz (76.6 kg)   SpO2 99%   BMI 24.22 kg/m  Wt Readings from Last 3 Encounters:  10/11/20 168 lb 12.8 oz (76.6 kg)  10/08/20 168 lb (76.2 kg)  09/30/20 170 lb 12.8 oz (77.5 kg)     Lab Results  Component Value Date   WBC 8.4 11/26/2019   HGB 14.0 11/26/2019   HCT 41.4 11/26/2019   PLT 183 11/26/2019   GLUCOSE 89 10/11/2020   CHOL 97 07/12/2020   TRIG 76.0 07/12/2020   HDL 36.30 (L) 07/12/2020   LDLCALC 45 07/12/2020   ALT 19 07/12/2020   AST 18 07/12/2020   NA 138 10/11/2020   K 3.6 10/11/2020   CL 100 10/11/2020   CREATININE 0.77 10/11/2020   BUN 12 10/11/2020   CO2 32 10/11/2020   TSH 3.71 03/09/2020    No results found.     Assessment & Plan:   Problem List Items Addressed This Visit    Anxiety    Discussed.  Has good support. Does not feel need any further intervention at this itme.  Follow.       CAD (coronary artery disease)    Followed by cardiology.  Just evaluated at urgent care.  W/up unrevealing.  Feels symptoms are stable.  Continue risk factor modification.       Essential hypertension - Primary    On coreg, amlodipine, hctz and avapro.  Unable to increase coreg doseing - pulse rate 60.  I added hydralazine 10mg  tid.  Blood pressure improved on recheck.  Adjust timing of medication.  Just added hydralazine.  Follow pressures.  Follow metabolic panel.       Relevant Orders   Basic metabolic panel (Completed)  Hypercholesterolemia    On lipitor.  Low cholesterol diet and exercise.  Follow lipid panel and liver function tests.        Neuropathy    Has NCS - polyneuropathy.  On lyrica.  Still issues.  Overall relatively stable.            Einar Pheasant, MD

## 2020-10-17 ENCOUNTER — Encounter: Payer: Self-pay | Admitting: Internal Medicine

## 2020-10-17 NOTE — Assessment & Plan Note (Signed)
On coreg, amlodipine, hctz and avapro.  Unable to increase coreg doseing - pulse rate 60.  I added hydralazine 10mg  tid.  Blood pressure improved on recheck.  Adjust timing of medication.  Just added hydralazine.  Follow pressures.  Follow metabolic panel.

## 2020-10-17 NOTE — Assessment & Plan Note (Signed)
On lipitor.  Low cholesterol diet and exercise.  Follow lipid panel and liver function tests.   

## 2020-10-17 NOTE — Assessment & Plan Note (Signed)
Has NCS - polyneuropathy.  On lyrica.  Still issues.  Overall relatively stable.

## 2020-10-17 NOTE — Assessment & Plan Note (Signed)
Followed by cardiology.  Just evaluated at urgent care.  W/up unrevealing.  Feels symptoms are stable.  Continue risk factor modification.

## 2020-10-17 NOTE — Assessment & Plan Note (Signed)
Discussed.  Has good support. Does not feel need any further intervention at this itme.  Follow.

## 2020-10-22 ENCOUNTER — Encounter: Payer: Self-pay | Admitting: Internal Medicine

## 2020-10-22 DIAGNOSIS — R1030 Lower abdominal pain, unspecified: Secondary | ICD-10-CM

## 2020-10-22 NOTE — Telephone Encounter (Signed)
Order placed for pelvic ultrasound.

## 2020-10-26 ENCOUNTER — Encounter: Payer: Self-pay | Admitting: Internal Medicine

## 2020-10-26 MED ORDER — HYDRALAZINE HCL 25 MG PO TABS: 25.0000 mg | ORAL_TABLET | Freq: Three times a day (TID) | ORAL | 1 refills | Status: DC

## 2020-10-26 NOTE — Telephone Encounter (Signed)
rx sent in for hydralazine 25mg  #90 with one refill.

## 2020-11-01 ENCOUNTER — Ambulatory Visit
Admission: RE | Admit: 2020-11-01 | Discharge: 2020-11-01 | Disposition: A | Discharge status: Home / Self Care | Payer: PPO | Source: Ambulatory Visit | Attending: Internal Medicine | Admitting: Internal Medicine

## 2020-11-01 ENCOUNTER — Other Ambulatory Visit: Payer: Self-pay

## 2020-11-01 DIAGNOSIS — N888 Other specified noninflammatory disorders of cervix uteri: Secondary | ICD-10-CM | POA: Diagnosis not present

## 2020-11-01 DIAGNOSIS — D259 Leiomyoma of uterus, unspecified: Secondary | ICD-10-CM | POA: Diagnosis not present

## 2020-11-01 DIAGNOSIS — R1030 Lower abdominal pain, unspecified: Secondary | ICD-10-CM

## 2020-11-09 DIAGNOSIS — I25708 Atherosclerosis of coronary artery bypass graft(s), unspecified, with other forms of angina pectoris: Secondary | ICD-10-CM | POA: Diagnosis not present

## 2020-11-09 DIAGNOSIS — I341 Nonrheumatic mitral (valve) prolapse: Secondary | ICD-10-CM | POA: Diagnosis not present

## 2020-11-09 DIAGNOSIS — I10 Essential (primary) hypertension: Secondary | ICD-10-CM | POA: Diagnosis not present

## 2020-11-09 DIAGNOSIS — I6523 Occlusion and stenosis of bilateral carotid arteries: Secondary | ICD-10-CM | POA: Diagnosis not present

## 2020-11-09 DIAGNOSIS — E782 Mixed hyperlipidemia: Secondary | ICD-10-CM | POA: Diagnosis not present

## 2020-11-10 ENCOUNTER — Other Ambulatory Visit: Payer: Self-pay

## 2020-11-10 ENCOUNTER — Other Ambulatory Visit (INDEPENDENT_AMBULATORY_CARE_PROVIDER_SITE_OTHER): Payer: PPO

## 2020-11-10 DIAGNOSIS — E7800 Pure hypercholesterolemia, unspecified: Secondary | ICD-10-CM | POA: Diagnosis not present

## 2020-11-10 DIAGNOSIS — I10 Essential (primary) hypertension: Secondary | ICD-10-CM | POA: Diagnosis not present

## 2020-11-10 LAB — BASIC METABOLIC PANEL
BUN: 14 mg/dL (ref 6–23)
CO2: 32 mEq/L (ref 19–32)
Calcium: 10.2 mg/dL (ref 8.4–10.5)
Chloride: 103 mEq/L (ref 96–112)
Creatinine, Ser: 0.77 mg/dL (ref 0.40–1.20)
GFR: 78.68 mL/min (ref >60.00)
Glucose, Bld: 86 mg/dL (ref 70–99)
Potassium: 3.3 mEq/L — ABNORMAL LOW (ref 3.5–5.1)
Sodium: 140 mEq/L (ref 135–145)

## 2020-11-10 LAB — LIPID PANEL
Cholesterol: 106 mg/dL (ref 0–200)
HDL: 42.5 mg/dL (ref >39.00)
LDL Cholesterol: 47 mg/dL (ref 0–99)
NonHDL: 63.03
Total CHOL/HDL Ratio: 2
Triglycerides: 82 mg/dL (ref 0.0–149.0)
VLDL: 16.4 mg/dL (ref 0.0–40.0)

## 2020-11-10 LAB — HEPATIC FUNCTION PANEL
ALT: 17 U/L (ref 0–35)
AST: 18 U/L (ref 0–37)
Albumin: 4.2 g/dL (ref 3.5–5.2)
Alkaline Phosphatase: 35 U/L — ABNORMAL LOW (ref 39–117)
Bilirubin, Direct: 0.1 mg/dL (ref 0.0–0.3)
Total Bilirubin: 0.6 mg/dL (ref 0.2–1.2)
Total Protein: 6.6 g/dL (ref 6.0–8.3)

## 2020-11-10 LAB — CBC WITH DIFFERENTIAL/PLATELET
Basophils Absolute: 0.1 10*3/uL (ref 0.0–0.1)
Basophils Relative: 1.2 % (ref 0.0–3.0)
Eosinophils Absolute: 0.1 10*3/uL (ref 0.0–0.7)
Eosinophils Relative: 2.2 % (ref 0.0–5.0)
HCT: 38.7 % (ref 36.0–46.0)
Hemoglobin: 13.3 g/dL (ref 12.0–15.0)
Lymphocytes Relative: 38.8 % (ref 12.0–46.0)
Lymphs Abs: 1.9 10*3/uL (ref 0.7–4.0)
MCHC: 34.3 g/dL (ref 30.0–36.0)
MCV: 89.5 fl (ref 78.0–100.0)
Monocytes Absolute: 0.6 10*3/uL (ref 0.1–1.0)
Monocytes Relative: 11.5 % (ref 3.0–12.0)
Neutro Abs: 2.3 10*3/uL (ref 1.4–7.7)
Neutrophils Relative %: 46.3 % (ref 43.0–77.0)
Platelets: 140 10*3/uL — ABNORMAL LOW (ref 150.0–400.0)
RBC: 4.33 Mil/uL (ref 3.87–5.11)
RDW: 13.1 % (ref 11.5–15.5)
WBC: 4.9 10*3/uL (ref 4.0–10.5)

## 2020-11-12 ENCOUNTER — Other Ambulatory Visit: Payer: Self-pay

## 2020-11-12 ENCOUNTER — Ambulatory Visit (INDEPENDENT_AMBULATORY_CARE_PROVIDER_SITE_OTHER): Payer: PPO | Admitting: Internal Medicine

## 2020-11-12 DIAGNOSIS — F419 Anxiety disorder, unspecified: Secondary | ICD-10-CM

## 2020-11-12 DIAGNOSIS — E876 Hypokalemia: Secondary | ICD-10-CM | POA: Diagnosis not present

## 2020-11-12 DIAGNOSIS — I2510 Atherosclerotic heart disease of native coronary artery without angina pectoris: Secondary | ICD-10-CM

## 2020-11-12 DIAGNOSIS — G629 Polyneuropathy, unspecified: Secondary | ICD-10-CM

## 2020-11-12 DIAGNOSIS — E7800 Pure hypercholesterolemia, unspecified: Secondary | ICD-10-CM | POA: Diagnosis not present

## 2020-11-12 DIAGNOSIS — I10 Essential (primary) hypertension: Secondary | ICD-10-CM | POA: Diagnosis not present

## 2020-11-12 DIAGNOSIS — D696 Thrombocytopenia, unspecified: Secondary | ICD-10-CM

## 2020-11-12 NOTE — Progress Notes (Signed)
[name], female   DOB: 16-Jun-1951, 70 y.o.   MRN: [id number]   Subjective:    [name], female    DOB: 06/11/1951, 69 y.o.   MRN: [id number]  HPI This visit occurred during the SARS-CoV-2 public health emergency.  Safety protocols were in place, including screening questions prior to the visit, additional usage of staff PPE, and extensive cleaning of exam room while observing appropriate contact time as indicated for disinfecting solutions.  Patient here for a scheduled follow up. She is here to follow up regarding her blood pressure. Saw cardiology.  Hydralazine was changed to 50mg  bid.  Started on aldactone in addition to her other blood pressure medications.  Reviewed outside blood pressure readings.  Recent readings have improved - 120s/50-60s.  Is also planning to have stress test this month and echo next month.  Breathing stable.  No abdominal pain.  Bowels moving.  Tolerating medication. Discussed labs.    Past Medical History:  Diagnosis Date  . Acid reflux   . Coronary artery disease   . 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 Heart murmur   . History of chicken pox   . Hyperlipidemia   . Hypertension   . Insomnia    Past Surgical History:  Procedure Laterality Date  . APPENDECTOMY    . BREAST EXCISIONAL BIOPSY Right    negative over 5 years ago- neg  . BREAST SURGERY     Biopsy  . COLONOSCOPY WITH PROPOFOL N/A 05/16/2016   Procedure: COLONOSCOPY WITH PROPOFOL;  Surgeon: [name], MD;  Location: [facility];  Service: Endoscopy;  Laterality: N/A;  . CORONARY ARTERY BYPASS GRAFT  05/01/2014   3 vessel, [facility]  . ESOPHAGOGASTRODUODENOSCOPY (EGD) WITH PROPOFOL N/A 02/23/2017   Procedure: ESOPHAGOGASTRODUODENOSCOPY (EGD) WITH PROPOFOL;  Surgeon: [name], MD;  Location: [place];  Service: Endoscopy;  Laterality: N/A;  . HYSTEROSCOPY WITH D & C    . lipoma removed     removed from forehead  . triple  bypass     Family History  Problem Relation Age of Onset  . Breast cancer Other        maternal great aunt  . Heart disease Other        multiple family members  . Heart disease Mother   . Stroke Mother   . Alzheimer's disease Mother   . Heart disease Father   . Stroke Father   . Breast cancer Sister 11  . Alzheimer's disease Maternal Aunt   . Alzheimer's disease Maternal Uncle   . Alzheimer's disease Maternal Grandmother   . Colon cancer Neg Hx   . Diabetes Neg Hx   . Ovarian cancer Neg Hx    Social History   Socioeconomic History  . Marital status: Married    Spouse name: Not on file  . Number of children: Not on file  . Years of education: Not on file  . Highest education level: Not on file  Occupational History  . Not on file  Tobacco Use  . Smoking status: Never Smoker  . Smokeless tobacco: Never Used  Vaping Use  . Vaping Use: Never used  Substance and Sexual Activity  . Alcohol use: Never    Alcohol/week: 0.0 standard drinks  . Drug use: No  . Sexual activity: Yes    Birth control/protection: Post-menopausal  Other Topics Concern  . Not on file  Social History Narrative  .  Not on file   Social Determinants of Health   Financial Resource Strain: Low Risk   . Difficulty of Paying Living Expenses: Not hard at all  Food Insecurity: No Food Insecurity  . Worried About Charity fundraiser in the Last Year: Never true  . Ran Out of Food in the Last Year: Never true  Transportation Needs: No Transportation Needs  . Lack of Transportation (Medical): No  . Lack of Transportation (Non-Medical): No  Physical Activity: Sufficiently Active  . Days of Exercise per Week: 3 days  . Minutes of Exercise per Session: 50 min  Stress: No Stress Concern Present  . Feeling of Stress : Not at all  Social Connections: Unknown  . Frequency of Communication with Friends and Family: More than three times a week  . Frequency of Social Gatherings with Friends and Family: More  than three times a week  . Attends Religious Services: More than 4 times per year  . Active Member of Clubs or Organizations: Yes  . Attends Archivist Meetings: More than 4 times per year  . Marital Status: Not on file    Outpatient Encounter Medications as of 11/12/2020  Medication Sig  . hydrALAZINE (APRESOLINE) 50 MG tablet Take 1 tablet by mouth 2 (two) times daily.  . Alpha-Lipoic Acid 100 MG CAPS Take 600 mg by mouth daily.  Marland Kitchen amLODipine (NORVASC) 10 MG tablet Take 10 mg by mouth daily.  Marland Kitchen ascorbic acid (VITAMIN C) 1000 MG tablet Take by mouth.  Marland Kitchen aspirin EC 81 MG tablet Take 81 mg by mouth daily.  Marland Kitchen atorvastatin (LIPITOR) 40 MG tablet Take by mouth.  . calcium-vitamin D (OSCAL WITH D) 500-200 MG-UNIT TABS tablet Take by mouth.  . carvedilol (COREG) 6.25 MG tablet Take 6.25 mg by mouth 2 (two) times daily.  . cyanocobalamin 1000 MCG tablet Take 1,000 mcg by mouth daily.  Mariane Baumgarten Calcium (STOOL SOFTENER PO) Take by mouth.  . hydrochlorothiazide (HYDRODIURIL) 25 MG tablet TAKE 1 TABLET (25 MG TOTAL) BY MOUTH ONCE DAILY.  Marland Kitchen irbesartan (AVAPRO) 300 MG tablet Take 300 mg by mouth daily.  . Omega-3 Fatty Acids (FISH OIL) 1000 MG CAPS Take 1,200 mg by mouth daily.  . pantoprazole (PROTONIX) 40 MG tablet TAKE 1 TABLET (40 MG TOTAL) BY MOUTH DAILY. TAKE 30 MINUTES BEFORE BREAKFAST  . pregabalin (LYRICA) 75 MG capsule TAKE 1-2 CAPSULES BY MOUTH EVERY MORNING AND TAKE TWO CAPSULES BY MOUTH EVERY EVENING  . rOPINIRole (REQUIP) 0.25 MG tablet Take 0.25 mg by mouth at bedtime.  Marland Kitchen spironolactone (ALDACTONE) 25 MG tablet Take 25 mg by mouth daily.  . traZODone (DESYREL) 50 MG tablet TAKE 1-2 TABLETS (50-100 MG TOTAL) BY MOUTH AT BEDTIME AS NEEDED.  . [DISCONTINUED] hydrALAZINE (APRESOLINE) 25 MG tablet Take 1 tablet (25 mg total) by mouth 3 (three) times daily.   No facility-administered encounter medications on file as of 11/12/2020.    Review of Systems  Constitutional: Negative  for appetite change and unexpected weight change.  HENT: Negative for congestion and sinus pressure.   Respiratory: Negative for cough, chest tightness and shortness of breath.   Cardiovascular: Negative for chest pain, palpitations and leg swelling.  Gastrointestinal: Negative for abdominal pain, diarrhea, nausea and vomiting.  Genitourinary: Negative for difficulty urinating and dysuria.  Musculoskeletal: Negative for joint swelling and myalgias.  Skin: Negative for color change and rash.  Neurological: Negative for dizziness, light-headedness and headaches.  Psychiatric/Behavioral: Negative for agitation and dysphoric mood.  Objective:    Physical Exam Vitals reviewed.  Constitutional:      General: She is not in acute distress.    Appearance: Normal appearance.  HENT:     Head: Normocephalic and atraumatic.     Right Ear: External ear normal.     Left Ear: External ear normal.     Mouth/Throat:     Mouth: Oropharynx is clear and moist.  Eyes:     General: No scleral icterus.       Right eye: No discharge.        Left eye: No discharge.     Conjunctiva/sclera: Conjunctivae normal.  Neck:     Thyroid: No thyromegaly.  Cardiovascular:     Rate and Rhythm: Normal rate and regular rhythm.  Pulmonary:     Effort: No respiratory distress.     Breath sounds: Normal breath sounds. No wheezing.  Abdominal:     General: Bowel sounds are normal.     Palpations: Abdomen is soft.     Tenderness: There is no abdominal tenderness.  Musculoskeletal:        General: No swelling, tenderness or edema.     Cervical back: Neck supple. No tenderness.  Lymphadenopathy:     Cervical: No cervical adenopathy.  Skin:    Findings: No erythema or rash.  Neurological:     Mental Status: She is alert.  Psychiatric:        Mood and Affect: Mood normal.        Behavior: Behavior normal.     BP 138/64   Pulse 63   Temp 98.2 F (36.8 C) (Oral)   Resp 16   Ht 5\' 10"  (1.778 m)   Wt  168 lb 12.8 oz (76.6 kg)   SpO2 98%   BMI 24.22 kg/m  Wt Readings from Last 3 Encounters:  11/12/20 168 lb 12.8 oz (76.6 kg)  10/11/20 168 lb 12.8 oz (76.6 kg)  10/08/20 168 lb (76.2 kg)     Lab Results  Component Value Date   WBC 4.9 11/10/2020   HGB 13.3 11/10/2020   HCT 38.7 11/10/2020   PLT 140.0 (L) 11/10/2020   GLUCOSE 86 11/10/2020   CHOL 106 11/10/2020   TRIG 82.0 11/10/2020   HDL 42.50 11/10/2020   LDLCALC 47 11/10/2020   ALT 17 11/10/2020   AST 18 11/10/2020   NA 140 11/10/2020   K 3.3 (L) 11/10/2020   CL 103 11/10/2020   CREATININE 0.77 11/10/2020   BUN 14 11/10/2020   CO2 32 11/10/2020   TSH 3.71 03/09/2020    US PELVIC COMPLETE WITH TRANSVAGINAL  Result Date: 11/01/2020 CLINICAL DATA:  Lower abdominal and pelvic pain EXAM: TRANSABDOMINAL AND TRANSVAGINAL ULTRASOUND OF PELVIS TECHNIQUE: Both transabdominal and transvaginal ultrasound examinations of the pelvis were performed. Transabdominal technique was performed for global imaging of the pelvis including uterus, ovaries, adnexal regions, and pelvic cul-de-sac. It was necessary to proceed with endovaginal exam following the transabdominal exam to visualize the uterus endometrium ovaries. COMPARISON:  Ultrasound 05/11/2015 FINDINGS: Uterus Measurements: 7.4 x 3.2 x 4.4 cm = volume: 54.7 mL. Large cervical cysts containing scattered internal echoes, the larger measures 2.1 cm but is unchanged in size compared to prior ultrasound. Anterior fundal intramural mass measuring 1.9 x 1.8 x 1.9 cm consistent with fibroid. Endometrium Thickness: 2.3 mm. Dense endometrial calcification measuring up to 9 mm in size, nonspecific and potentially due to prior infection or inflammatory process. Right ovary Not visualized Left ovary Not visualized.  Other findings No abnormal free fluid. IMPRESSION: 1. Uterine fibroid. 2. Nonvisualized ovaries. Electronically Signed   By: Donavan Foil M.D.   On: 11/01/2020 15:13       Assessment &  Plan:   Problem List Items Addressed This Visit    Anxiety    Has good support. Does not feel needs any further intervention.  Follow.        CAD (coronary artery disease)    Followed by cardiology.  Planning for stress test this month.  Echo next month.  Follow.  Continue risk factor modification.       Relevant Medications   spironolactone (ALDACTONE) 25 MG tablet   hydrALAZINE (APRESOLINE) 50 MG tablet   Essential hypertension    Blood pressure doing better.  On coreg, amlodipine, hctz, avapro, hydralazine and now aldactone - started by cardiology.  Blood pressure improved.  Discussed further w/up for hypertension requiring multiple medications.  Wants to monitor on current medication regimen, especially since just starting aldactone.  Follow pressures.  Schedule for metabolic panel to be checked within the next two weeks.        Relevant Medications   spironolactone (ALDACTONE) 25 MG tablet   hydrALAZINE (APRESOLINE) 50 MG tablet   Other Relevant Orders   Basic metabolic panel   Hypercholesterolemia    On lipitor.  Low cholesterol diet and exercise.  Follow lipid panel and liver function tests.        Relevant Medications   spironolactone (ALDACTONE) 25 MG tablet   hydrALAZINE (APRESOLINE) 50 MG tablet   Hypokalemia    Slightly decrease potassium on recent labs.  Just started aldactone.  Recheck metabolic panel.        Neuropathy    Had NCS - polyneuropathy.  On lyrica.  Stable.       Thrombocytopenia (HCC)    Platelet count slightly decreased on recent check.  Recheck platelet count with next labs.        Relevant Orders   Platelet count       Einar Pheasant, MD

## 2020-11-14 ENCOUNTER — Encounter: Payer: Self-pay | Admitting: Internal Medicine

## 2020-11-14 DIAGNOSIS — D696 Thrombocytopenia, unspecified: Secondary | ICD-10-CM | POA: Insufficient documentation

## 2020-11-14 DIAGNOSIS — E876 Hypokalemia: Secondary | ICD-10-CM | POA: Insufficient documentation

## 2020-11-14 NOTE — Assessment & Plan Note (Signed)
Had NCS - polyneuropathy.  On lyrica.  Stable.

## 2020-11-14 NOTE — Assessment & Plan Note (Signed)
On lipitor.  Low cholesterol diet and exercise.  Follow lipid panel and liver function tests.   

## 2020-11-14 NOTE — Assessment & Plan Note (Signed)
Platelet count slightly decreased on recent check.  Recheck platelet count with next labs.

## 2020-11-14 NOTE — Assessment & Plan Note (Signed)
Followed by cardiology.  Planning for stress test this month.  Echo next month.  Follow.  Continue risk factor modification.

## 2020-11-14 NOTE — Assessment & Plan Note (Signed)
Blood pressure doing better.  On coreg, amlodipine, hctz, avapro, hydralazine and now aldactone - started by cardiology.  Blood pressure improved.  Discussed further w/up for hypertension requiring multiple medications.  Wants to monitor on current medication regimen, especially since just starting aldactone.  Follow pressures.  Schedule for metabolic panel to be checked within the next two weeks.

## 2020-11-14 NOTE — Assessment & Plan Note (Signed)
Has good support. Does not feel needs any further intervention.  Follow.

## 2020-11-14 NOTE — Assessment & Plan Note (Signed)
Slightly decrease potassium on recent labs.  Just started aldactone.  Recheck metabolic panel.

## 2020-11-19 ENCOUNTER — Other Ambulatory Visit: Payer: Self-pay | Admitting: Internal Medicine

## 2020-11-22 DIAGNOSIS — I25708 Atherosclerosis of coronary artery bypass graft(s), unspecified, with other forms of angina pectoris: Secondary | ICD-10-CM | POA: Diagnosis not present

## 2020-11-24 DIAGNOSIS — G44099 Other trigeminal autonomic cephalgias (TAC), not intractable: Secondary | ICD-10-CM | POA: Diagnosis not present

## 2020-11-24 DIAGNOSIS — G2581 Restless legs syndrome: Secondary | ICD-10-CM | POA: Diagnosis not present

## 2020-11-24 DIAGNOSIS — G608 Other hereditary and idiopathic neuropathies: Secondary | ICD-10-CM | POA: Diagnosis not present

## 2020-11-26 ENCOUNTER — Other Ambulatory Visit: Payer: Self-pay

## 2020-11-26 ENCOUNTER — Other Ambulatory Visit (INDEPENDENT_AMBULATORY_CARE_PROVIDER_SITE_OTHER): Payer: PPO

## 2020-11-26 DIAGNOSIS — D696 Thrombocytopenia, unspecified: Secondary | ICD-10-CM

## 2020-11-26 DIAGNOSIS — I10 Essential (primary) hypertension: Secondary | ICD-10-CM | POA: Diagnosis not present

## 2020-11-26 LAB — PLATELET COUNT: Platelets: 179 10*3/uL (ref 140–400)

## 2020-11-26 LAB — BASIC METABOLIC PANEL
BUN: 16 mg/dL (ref 6–23)
CO2: 30 mEq/L (ref 19–32)
Calcium: 10.1 mg/dL (ref 8.4–10.5)
Chloride: 103 mEq/L (ref 96–112)
Creatinine, Ser: 0.82 mg/dL (ref 0.40–1.20)
GFR: 72.94 mL/min (ref >60.00)
Glucose, Bld: 80 mg/dL (ref 70–99)
Potassium: 3.9 mEq/L (ref 3.5–5.1)
Sodium: 140 mEq/L (ref 135–145)

## 2020-12-09 ENCOUNTER — Other Ambulatory Visit: Payer: Self-pay | Admitting: Internal Medicine

## 2020-12-28 DIAGNOSIS — I25708 Atherosclerosis of coronary artery bypass graft(s), unspecified, with other forms of angina pectoris: Secondary | ICD-10-CM | POA: Diagnosis not present

## 2020-12-30 DIAGNOSIS — I25810 Atherosclerosis of coronary artery bypass graft(s) without angina pectoris: Secondary | ICD-10-CM | POA: Diagnosis not present

## 2020-12-30 DIAGNOSIS — I10 Essential (primary) hypertension: Secondary | ICD-10-CM | POA: Diagnosis not present

## 2020-12-30 DIAGNOSIS — I6523 Occlusion and stenosis of bilateral carotid arteries: Secondary | ICD-10-CM | POA: Diagnosis not present

## 2020-12-30 DIAGNOSIS — E782 Mixed hyperlipidemia: Secondary | ICD-10-CM | POA: Diagnosis not present

## 2021-01-02 ENCOUNTER — Other Ambulatory Visit: Payer: Self-pay | Admitting: Internal Medicine

## 2021-01-11 ENCOUNTER — Encounter: Payer: Self-pay | Admitting: Internal Medicine

## 2021-01-11 ENCOUNTER — Other Ambulatory Visit: Payer: Self-pay

## 2021-01-11 ENCOUNTER — Ambulatory Visit (INDEPENDENT_AMBULATORY_CARE_PROVIDER_SITE_OTHER): Payer: PPO | Admitting: Internal Medicine

## 2021-01-11 DIAGNOSIS — I2510 Atherosclerotic heart disease of native coronary artery without angina pectoris: Secondary | ICD-10-CM

## 2021-01-11 DIAGNOSIS — F419 Anxiety disorder, unspecified: Secondary | ICD-10-CM | POA: Diagnosis not present

## 2021-01-11 DIAGNOSIS — D696 Thrombocytopenia, unspecified: Secondary | ICD-10-CM | POA: Diagnosis not present

## 2021-01-11 DIAGNOSIS — G629 Polyneuropathy, unspecified: Secondary | ICD-10-CM

## 2021-01-11 DIAGNOSIS — I10 Essential (primary) hypertension: Secondary | ICD-10-CM | POA: Diagnosis not present

## 2021-01-11 DIAGNOSIS — K2100 Gastro-esophageal reflux disease with esophagitis, without bleeding: Secondary | ICD-10-CM

## 2021-01-11 DIAGNOSIS — E7800 Pure hypercholesterolemia, unspecified: Secondary | ICD-10-CM | POA: Diagnosis not present

## 2021-01-11 NOTE — Progress Notes (Signed)
Patient ID: Jennette Kettle, female   DOB: 03/03/1951, 70 y.o.   MRN: [id number]   Subjective:    Patient ID: Jennette Kettle, female    DOB: October 17, 1950, 70 y.o.   MRN: [id number]  HPI This visit occurred during the SARS-CoV-2 public health emergency.  Safety protocols were in place, including screening questions prior to the visit, additional usage of staff PPE, and extensive cleaning of exam room while observing appropriate contact time as indicated for disinfecting solutions.  Patient here for a scheduled follow up. Here to follow up regarding her blood pressure.  Recently had problems with noticing a sinking feeling.  Noticed low blood pressure.  Spironolactone stopped.  Has noticed since stopping, her blood pressure is starting to increase.  Discussed adjusting medication.  Just saw cardiology 12/30/20.  Felt stable. No changes made.  No chest pain.  Breathing stable. No acid reflux reported.  No abdominal pain.  Bowels moving.  Continues on alpha lipoic acid and lyrica for her neuropathy.  On requip for RLS.  Stable.     Past Medical History:  Diagnosis Date  . Acid reflux   . Coronary artery disease   . 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 Heart murmur   . History of chicken pox   . Hyperlipidemia   . Hypertension   . Insomnia    Past Surgical History:  Procedure Laterality Date  . APPENDECTOMY    . BREAST EXCISIONAL BIOPSY Right    negative over 5 years ago- neg  . BREAST SURGERY     Biopsy  . COLONOSCOPY WITH PROPOFOL N/A 05/16/2016   Procedure: COLONOSCOPY WITH PROPOFOL;  Surgeon: [name], MD;  Location: [facility] END[facility]COPY;  Service: Endoscopy;  Laterality: N/A;  . CORONARY ARTERY BYPASS GRAFT  05/01/2014   3 vessel, [facility]  . ESOPHAGOGASTRODUODENOSCOPY (EGD) WITH PROPOFOL N/A 02/23/2017   Procedure: ESOPHAGOGASTRODUODENOSCOPY (EGD) WITH PROPOFOL;  Surgeon: [name], MD;  Location: [facility];  Service: Endoscopy;  Laterality: N/A;  .  HYSTEROSCOPY WITH D & C    . lipoma removed     removed from forehead  . triple bypass     Family History  Problem Relation Age of Onset  . Breast cancer Other        maternal great aunt  . Heart disease Other        multiple family members  . Heart disease Mother   . Stroke Mother   . Alzheimer's disease Mother   . Heart disease Father   . Stroke Father   . Breast cancer Sister 20  . Alzheimer's disease Maternal Aunt   . Alzheimer's disease Maternal Uncle   . Alzheimer's disease Maternal Grandmother   . Colon cancer Neg Hx   . Diabetes Neg Hx   . Ovarian cancer Neg Hx    Social History   Socioeconomic History  . Marital status: Married    Spouse name: Not on file  . Number of children: Not on file  . Years of education: Not on file  . Highest education level: Not on file  Occupational History  . Not on file  Tobacco Use  . Smoking status: Never Smoker  . Smokeless tobacco: Never Used  Vaping Use  . Vaping Use: Never used  Substance and Sexual Activity  . Alcohol use: Never    Alcohol/week: 0.0 standard drinks  . Drug use: No  . Sexual activity: Yes    Birth control/protection:  Post-menopausal  Other Topics Concern  . Not on file  Social History Narrative  . Not on file   Social Determinants of Health   Financial Resource Strain: Low Risk   . Difficulty of Paying Living Expenses: Not hard at all  Food Insecurity: No Food Insecurity  . Worried About [place] fund[place] in the Last Year: Never true  . Ran Out of Food in the Last Year: Never true  Transportation Needs: No Transportation Needs  . Lack of Transportation (Medical): No  . Lack of Transportation (Non-Medical): No  Physical Activity: Sufficiently Active  . Days of Exercise per Week: 3 days  . Minutes of Exercise per Session: 50 min  Stress: No Stress Concern Present  . Feeling of Stress : Not at all  Social Connections: Unknown  . Frequency of Communication with Friends and Family: More than  three times a week  . Frequency of Social Gatherings with Friends and Family: More than three times a week  . Attends Religious Services: More than 4 times per year  . Active Member of Clubs or Organizations: Yes  . Attends Archivist Meetings: More than 4 times per year  . Marital Status: Not on file    Outpatient Encounter Medications as of 01/11/2021  Medication Sig  . Alpha-Lipoic Acid 100 MG CAPS Take 600 mg by mouth daily.  Marland Kitchen amLODipine (NORVASC) 10 MG tablet Take 10 mg by mouth daily.  Marland Kitchen ascorbic acid (VITAMIN C) 1000 MG tablet Take by mouth.  Marland Kitchen aspirin EC 81 MG tablet Take 81 mg by mouth daily.  Marland Kitchen atorvastatin (LIPITOR) 40 MG tablet Take by mouth.  . calcium-vitamin D (OSCAL WITH D) 500-200 MG-UNIT TABS tablet Take by mouth.  . carvedilol (COREG) 6.25 MG tablet Take 6.25 mg by mouth 2 (two) times daily.  . cyanocobalamin 1000 MCG tablet Take 1,000 mcg by mouth daily.  Mariane Baumgarten Calcium (STOOL SOFTENER PO) Take by mouth.  . hydrALAZINE (APRESOLINE) 50 MG tablet Take 1 tablet by mouth 2 (two) times daily.  . hydrochlorothiazide (HYDRODIURIL) 25 MG tablet TAKE 1 TABLET (25 MG TOTAL) BY MOUTH ONCE DAILY.  Marland Kitchen irbesartan (AVAPRO) 300 MG tablet Take 300 mg by mouth daily.  . Omega-3 Fatty Acids (FISH OIL) 1000 MG CAPS Take 1,200 mg by mouth daily.  . pantoprazole (PROTONIX) 40 MG tablet TAKE 1 TABLET (40 MG TOTAL) BY MOUTH DAILY. TAKE 30 MINUTES BEFORE BREAKFAST  . pregabalin (LYRICA) 75 MG capsule TAKE 1-2 CAPSULES BY MOUTH EVERY MORNING AND TAKE TWO CAPSULES BY MOUTH EVERY EVENING  . rOPINIRole (REQUIP) 0.25 MG tablet Take 0.25 mg by mouth at bedtime.  Marland Kitchen spironolactone (ALDACTONE) 25 MG tablet Take 25 mg by mouth daily.  . traZODone (DESYREL) 50 MG tablet TAKE 1-2 TABLETS (50-100 MG TOTAL) BY MOUTH AT BEDTIME AS NEEDED.   No facility-administered encounter medications on file as of 01/11/2021.    Review of Systems  Constitutional: Negative for appetite change and unexpected  weight change.  HENT: Negative for congestion and sinus pressure.   Respiratory: Negative for cough, chest tightness and shortness of breath.   Cardiovascular: Negative for chest pain, palpitations and leg swelling.  Gastrointestinal: Negative for abdominal pain, diarrhea, nausea and vomiting.  Genitourinary: Negative for difficulty urinating and dysuria.  Musculoskeletal: Negative for joint swelling and myalgias.  Skin: Negative for color change and rash.  Neurological: Negative for dizziness, light-headedness and headaches.  Psychiatric/Behavioral: Negative for agitation and dysphoric mood.  Objective:    Physical Exam Vitals reviewed.  Constitutional:      General: She is not in acute distress.    Appearance: Normal appearance.  HENT:     Head: Normocephalic and atraumatic.     Right Ear: External ear normal.     Left Ear: External ear normal.  Eyes:     General: No scleral icterus.       Right eye: No discharge.        Left eye: No discharge.     Conjunctiva/sclera: Conjunctivae normal.  Neck:     Thyroid: No thyromegaly.  Cardiovascular:     Rate and Rhythm: Normal rate and regular rhythm.  Pulmonary:     Effort: No respiratory distress.     Breath sounds: Normal breath sounds. No wheezing.  Abdominal:     General: Bowel sounds are normal.     Palpations: Abdomen is soft.     Tenderness: There is no abdominal tenderness.  Musculoskeletal:        General: No swelling or tenderness.     Cervical back: Neck supple. No tenderness.  Lymphadenopathy:     Cervical: No cervical adenopathy.  Skin:    Findings: No erythema or rash.  Neurological:     Mental Status: She is alert.  Psychiatric:        Mood and Affect: Mood normal.        Behavior: Behavior normal.     BP 134/70   Pulse 66   Temp (!) 96.5 F (35.8 C) (Temporal)   Resp 16   Ht 5' 10"  (1.778 m)   Wt 171 lb (77.6 kg)   SpO2 99%   BMI 24.54 kg/m  Wt Readings from Last 3 Encounters:  01/11/21  171 lb (77.6 kg)  11/12/20 168 lb 12.8 oz (76.6 kg)  10/11/20 168 lb 12.8 oz (76.6 kg)     Lab Results  Component Value Date   WBC 4.9 11/10/2020   HGB 13.3 11/10/2020   HCT 38.7 11/10/2020   PLT 179 11/26/2020   GLUCOSE 80 11/26/2020   CHOL 106 11/10/2020   TRIG 82.0 11/10/2020   HDL 42.50 11/10/2020   LDLCALC 47 11/10/2020   ALT 17 11/10/2020   AST 18 11/10/2020   NA 140 11/26/2020   K 3.9 11/26/2020   CL 103 11/26/2020   CREATININE 0.82 11/26/2020   BUN 16 11/26/2020   CO2 30 11/26/2020   TSH 3.71 03/09/2020    US PELVIC COMPLETE WITH TRANSVAGINAL  Result Date: 11/01/2020 CLINICAL DATA:  Lower abdominal and pelvic pain EXAM: TRANSABDOMINAL AND TRANSVAGINAL ULTRASOUND OF PELVIS TECHNIQUE: Both transabdominal and transvaginal ultrasound examinations of the pelvis were performed. Transabdominal technique was performed for global imaging of the pelvis including uterus, ovaries, adnexal regions, and pelvic cul-de-sac. It was necessary to proceed with endovaginal exam following the transabdominal exam to visualize the uterus endometrium ovaries. COMPARISON:  Ultrasound 05/11/2015 FINDINGS: Uterus Measurements: 7.4 x 3.2 x 4.4 cm = volume: 54.7 mL. Large cervical cysts containing scattered internal echoes, the larger measures 2.1 cm but is unchanged in size compared to prior ultrasound. Anterior fundal intramural mass measuring 1.9 x 1.8 x 1.9 cm consistent with fibroid. Endometrium Thickness: 2.3 mm. Dense endometrial calcification measuring up to 9 mm in size, nonspecific and potentially due to prior infection or inflammatory process. Right ovary Not visualized Left ovary Not visualized. Other findings No abnormal free fluid. IMPRESSION: 1. Uterine fibroid. 2. Nonvisualized ovaries. Electronically Signed   By: Maudie Mercury  Francoise Ceo M.D.   On: 11/01/2020 15:13       Assessment & Plan:   Problem List Items Addressed This Visit    Anxiety    Overall appears to be doing well.  Does not feel  needs any further intervention.  Follow.       CAD (coronary artery disease)    Followed by cardiology.  Stable.  Just evaluated 12/30/20.  No changes made.  Continue risk factor modification.       Esophagitis, reflux    No upper symptoms reported.  On protonix.        Essential hypertension    Currently on coreg, amlodipine, hctz, avapro and hydralazine.  Recently started aldactone.  Noticed a sinking feeling and aldactone was stopped.  Discussed with her today.  Blood pressure increasing.  Will start aldactone 23m 1/2 tablet per day.  If responds well to this, then may be able to stop hydralazine and titrate up aldactone.  Follow pressures.  Will need met b check soon to confirm electrolytes stable on aldactone.       Hypercholesterolemia    Continue lipitor.  Low cholesterol diet and exercise.  Follow lipid panel and liver function tests.       Neuropathy    NCS revealed polyneuropathy.  Continue lyrica.  Stable.        [place]    Follow cbc.            CEinar Pheasant MD

## 2021-01-11 NOTE — Patient Instructions (Signed)
Start spironolactone 12.5mg  per day (1/2 25mg  tablet).

## 2021-01-13 ENCOUNTER — Telehealth: Payer: Self-pay | Admitting: *Deleted

## 2021-01-13 DIAGNOSIS — I10 Essential (primary) hypertension: Secondary | ICD-10-CM

## 2021-01-13 NOTE — Telephone Encounter (Signed)
Please place future orders for lab appt.  

## 2021-01-13 NOTE — Telephone Encounter (Signed)
Order placed for f/u met b 

## 2021-01-16 ENCOUNTER — Encounter: Payer: Self-pay | Admitting: Internal Medicine

## 2021-01-16 NOTE — Assessment & Plan Note (Signed)
Overall appears to be doing well.  Does not feel needs any further intervention.  Follow.

## 2021-01-16 NOTE — Assessment & Plan Note (Signed)
Continue lipitor.  Low cholesterol diet and exercise.  Follow lipid panel and liver function tests.   

## 2021-01-16 NOTE — Assessment & Plan Note (Signed)
Follow cbc.  

## 2021-01-16 NOTE — Assessment & Plan Note (Signed)
No upper symptoms reported.  On protonix.   

## 2021-01-16 NOTE — Assessment & Plan Note (Signed)
NCS revealed polyneuropathy.  Continue lyrica.  Stable.

## 2021-01-16 NOTE — Assessment & Plan Note (Signed)
Followed by cardiology.  Stable.  Just evaluated 12/30/20.  No changes made.  Continue risk factor modification.

## 2021-01-16 NOTE — Assessment & Plan Note (Signed)
Currently on coreg, amlodipine, hctz, avapro and hydralazine.  Recently started aldactone.  Noticed a sinking feeling and aldactone was stopped.  Discussed with her today.  Blood pressure increasing.  Will start aldactone 15m 1/2 tablet per day.  If responds well to this, then may be able to stop hydralazine and titrate up aldactone.  Follow pressures.  Will need met b check soon to confirm electrolytes stable on aldactone.

## 2021-01-25 ENCOUNTER — Encounter: Payer: Self-pay | Admitting: Internal Medicine

## 2021-01-25 ENCOUNTER — Other Ambulatory Visit (INDEPENDENT_AMBULATORY_CARE_PROVIDER_SITE_OTHER): Payer: PPO

## 2021-01-25 ENCOUNTER — Other Ambulatory Visit: Payer: Self-pay

## 2021-01-25 DIAGNOSIS — I10 Essential (primary) hypertension: Secondary | ICD-10-CM | POA: Diagnosis not present

## 2021-01-25 LAB — BASIC METABOLIC PANEL
BUN: 16 mg/dL (ref 6–23)
CO2: 31 mEq/L (ref 19–32)
Calcium: 10 mg/dL (ref 8.4–10.5)
Chloride: 101 mEq/L (ref 96–112)
Creatinine, Ser: 0.9 mg/dL (ref 0.40–1.20)
GFR: 65.15 mL/min (ref >60.00)
Glucose, Bld: 88 mg/dL (ref 70–99)
Potassium: 4 mEq/L (ref 3.5–5.1)
Sodium: 138 mEq/L (ref 135–145)

## 2021-01-25 MED ORDER — PREGABALIN 75 MG PO CAPS: ORAL_CAPSULE | ORAL | 2 refills | Status: DC

## 2021-01-25 NOTE — Telephone Encounter (Signed)
rx sent in for lyrica #120 with 2 refills.

## 2021-01-26 DIAGNOSIS — Z1152 Encounter for screening for COVID-19: Secondary | ICD-10-CM | POA: Diagnosis not present

## 2021-01-27 ENCOUNTER — Encounter: Payer: Self-pay | Admitting: Internal Medicine

## 2021-02-07 DIAGNOSIS — U071 COVID-19: Secondary | ICD-10-CM | POA: Diagnosis not present

## 2021-02-07 DIAGNOSIS — Z20822 Contact with and (suspected) exposure to covid-19: Secondary | ICD-10-CM | POA: Diagnosis not present

## 2021-03-08 ENCOUNTER — Encounter: Payer: Self-pay | Admitting: Internal Medicine

## 2021-03-08 ENCOUNTER — Other Ambulatory Visit: Payer: Self-pay

## 2021-03-08 ENCOUNTER — Ambulatory Visit (INDEPENDENT_AMBULATORY_CARE_PROVIDER_SITE_OTHER): Payer: PPO | Admitting: Internal Medicine

## 2021-03-08 DIAGNOSIS — D696 Thrombocytopenia, unspecified: Secondary | ICD-10-CM

## 2021-03-08 DIAGNOSIS — I10 Essential (primary) hypertension: Secondary | ICD-10-CM

## 2021-03-08 DIAGNOSIS — F419 Anxiety disorder, unspecified: Secondary | ICD-10-CM | POA: Diagnosis not present

## 2021-03-08 DIAGNOSIS — E7800 Pure hypercholesterolemia, unspecified: Secondary | ICD-10-CM

## 2021-03-08 DIAGNOSIS — I2510 Atherosclerotic heart disease of native coronary artery without angina pectoris: Secondary | ICD-10-CM

## 2021-03-08 DIAGNOSIS — G629 Polyneuropathy, unspecified: Secondary | ICD-10-CM

## 2021-03-08 DIAGNOSIS — D3502 Benign neoplasm of left adrenal gland: Secondary | ICD-10-CM

## 2021-03-08 NOTE — Progress Notes (Signed)
[name], female   DOB: 1951/07/21, 70 y.o.   MRN: [id number]   Subjective:    [name], female    DOB: [birth year], 70 y.o.   MRN: [id number]  HPI This visit occurred during the SARS-CoV-2 public health emergency.  Safety protocols were in place, including screening questions prior to the visit, additional usage of staff PPE, and extensive cleaning of exam room while observing appropriate contact time as indicated for disinfecting solutions.   Patient here for a scheduled follow up.  Here to follow up regarding blood pressure - on multiple medications.  Reviewed outside blood pressure checks:  most readings 110-130s/50-60s.  Went on a cruise recently.  Diagnosed with covid 02/07/21.  Treated - received molnupiravir.  Doing well now.  No residual problems.  No chest pain or sob reported.  No cough or congestion reported.  No abdominal pain.  Bowels moving.   .  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)   Heart murmur    History of chicken pox    Hyperlipidemia    Hypertension    Insomnia    Past Surgical History:  Procedure Laterality Date   APPENDECTOMY     BREAST EXCISIONAL BIOPSY Right    negative over 5 years ago- neg   BREAST SURGERY     Biopsy   COLONOSCOPY WITH PROPOFOL N/A 05/16/2016   Procedure: COLONOSCOPY WITH PROPOFOL;  Surgeon: [name], MD;  Location: [facility];  Service: Endoscopy;  Laterality: N/A;   CORONARY ARTERY BYPASS GRAFT  05/01/2014   3 vessel, [facility]   ESOPHAGOGASTRODUODENOSCOPY (EGD) WITH PROPOFOL N/A 02/23/2017   Procedure: ESOPHAGOGASTRODUODENOSCOPY (EGD) WITH PROPOFOL;  Surgeon: [name], MD;  Location: [facility];  Service: Endoscopy;  Laterality: N/A;   HYSTEROSCOPY WITH D & C     lipoma removed     removed from forehead   triple bypass     Family History  Problem Relation Age of Onset   Breast  cancer Other        maternal great aunt   Heart disease Other        multiple family members   Heart disease Mother    Stroke Mother    Alzheimer's disease Mother    Heart disease Father    Stroke Father    Breast cancer Sister 77   Alzheimer's disease Maternal Aunt    Alzheimer's disease Maternal Uncle    Alzheimer's disease Maternal Grandmother    Colon cancer Neg Hx    Diabetes Neg Hx    Ovarian cancer Neg Hx    Social History   Socioeconomic History   Marital status: Married    Spouse name: Not on file   Number of children: Not on file   Years of education: Not on file   Highest education level: Not on file  Occupational History   Not on file  Tobacco Use   Smoking status: Never   Smokeless tobacco: Never  Vaping Use   Vaping Use: Never used  Substance and Sexual Activity   Alcohol use: Never    Alcohol/week: 0.0 standard drinks   Drug use: No   Sexual activity: Yes    Birth control/protection: Post-menopausal  Other Topics Concern   Not on file  Social History Narrative   Not on file   Social Determinants  of Health   Financial Resource Strain: Low Risk    Difficulty of Paying Living Expenses: Not hard at all  Food Insecurity: No Food Insecurity   Worried About [place] in the Last Year: Never true   [place] in the Last Year: Never true  Transportation Needs: No Transportation Needs   Lack of Transportation (Medical): No   Lack of Transportation (Non-Medical): No  Physical Activity: Sufficiently Active   Days of Exercise per Week: 3 days   Minutes of Exercise per Session: 50 min  Stress: No Stress Concern Present   Feeling of Stress : Not at all  Social Connections: Unknown   Frequency of Communication with Friends and Family: More than three times a week   Frequency of Social Gatherings with Friends and Family: More than three times a week   Attends Religious Services: More than 4 times per year   Active Member of [employer] or  Organizations: Yes   Attends Music therapist: More than 4 times per year   Marital Status: Not on file    Review of Systems  Constitutional:  Negative for appetite change and unexpected weight change.  HENT:  Negative for congestion and sinus pressure.   Respiratory:  Negative for cough, chest tightness and shortness of breath.   Cardiovascular:  Negative for chest pain, palpitations and leg swelling.  Gastrointestinal:  Negative for abdominal pain, diarrhea, nausea and vomiting.  Genitourinary:  Negative for difficulty urinating and dysuria.  Musculoskeletal:  Negative for joint swelling and myalgias.  Skin:  Negative for color change and rash.  Neurological:  Negative for dizziness, light-headedness and headaches.  Psychiatric/Behavioral:  Negative for agitation and dysphoric mood.       Objective:    Physical Exam Vitals reviewed.  Constitutional:      General: She is not in acute distress.    Appearance: Normal appearance.  HENT:     Head: Normocephalic and atraumatic.     Right Ear: External ear normal.     Left Ear: External ear normal.  Eyes:     General: No scleral icterus.       Right eye: No discharge.        Left eye: No discharge.     Conjunctiva/sclera: Conjunctivae normal.  Neck:     Thyroid: No thyromegaly.  Cardiovascular:     Rate and Rhythm: Normal rate and regular rhythm.  Pulmonary:     Effort: No respiratory distress.     Breath sounds: Normal breath sounds. No wheezing.  Abdominal:     General: Bowel sounds are normal.     Palpations: Abdomen is soft.     Tenderness: There is no abdominal tenderness.  Musculoskeletal:        General: No swelling or tenderness.     Cervical back: Neck supple. No tenderness.  Lymphadenopathy:     Cervical: No cervical adenopathy.  Skin:    Findings: No erythema or rash.  Neurological:     Mental Status: She is alert.  Psychiatric:        Mood and Affect: Mood normal.        Behavior: Behavior  normal.    BP 140/78   Pulse 64   Temp (!) 96.4 F (35.8 C)   Resp 16   Ht 5\' 10"  (1.778 m)   Wt 169 lb (76.7 kg)   SpO2 98%   BMI 24.25 kg/m  Wt Readings from Last 3 Encounters:  03/08/21  169 lb (76.7 kg)  01/11/21 171 lb (77.6 kg)  11/12/20 168 lb 12.8 oz (76.6 kg)    Outpatient Encounter Medications as of 03/08/2021  Medication Sig   Alpha-Lipoic Acid 100 MG CAPS Take 600 mg by mouth daily.   amLODipine (NORVASC) 10 MG tablet Take 10 mg by mouth daily.   ascorbic acid (VITAMIN C) 1000 MG tablet Take by mouth.   aspirin EC 81 MG tablet Take 81 mg by mouth daily.   atorvastatin (LIPITOR) 40 MG tablet Take by mouth.   calcium-vitamin D (OSCAL WITH D) 500-200 MG-UNIT TABS tablet Take by mouth.   carvedilol (COREG) 6.25 MG tablet Take 6.25 mg by mouth 2 (two) times daily.   cyanocobalamin 1000 MCG tablet Take 1,000 mcg by mouth daily.   Docusate Calcium (STOOL SOFTENER PO) Take by mouth.   hydrALAZINE (APRESOLINE) 50 MG tablet Take 1 tablet by mouth 2 (two) times daily.   hydrochlorothiazide (HYDRODIURIL) 25 MG tablet TAKE 1 TABLET (25 MG TOTAL) BY MOUTH ONCE DAILY.   irbesartan (AVAPRO) 300 MG tablet Take 300 mg by mouth daily.   Omega-3 Fatty Acids (FISH OIL) 1000 MG CAPS Take 1,200 mg by mouth daily.   pantoprazole (PROTONIX) 40 MG tablet TAKE 1 TABLET (40 MG TOTAL) BY MOUTH DAILY. TAKE 30 MINUTES BEFORE BREAKFAST   pregabalin (LYRICA) 75 MG capsule TAKE 1-2 CAPSULES BY MOUTH EVERY MORNING AND TAKE TWO CAPSULES BY MOUTH EVERY EVENING   rOPINIRole (REQUIP) 0.25 MG tablet Take 0.25 mg by mouth at bedtime.   spironolactone (ALDACTONE) 25 MG tablet Take 25 mg by mouth daily.   traZODone (DESYREL) 50 MG tablet TAKE 1-2 TABLETS (50-100 MG TOTAL) BY MOUTH AT BEDTIME AS NEEDED.   No facility-administered encounter medications on file as of 03/08/2021.     Lab Results  Component Value Date   WBC 4.9 11/10/2020   HGB 13.3 11/10/2020   HCT 38.7 11/10/2020   PLT 179 11/26/2020    GLUCOSE 88 01/25/2021   CHOL 106 11/10/2020   TRIG 82.0 11/10/2020   HDL 42.50 11/10/2020   LDLCALC 47 11/10/2020   ALT 17 11/10/2020   AST 18 11/10/2020   NA 138 01/25/2021   K 4.0 01/25/2021   CL 101 01/25/2021   CREATININE 0.90 01/25/2021   BUN 16 01/25/2021   CO2 31 01/25/2021   TSH 3.71 03/09/2020    US PELVIC COMPLETE WITH TRANSVAGINAL  Result Date: 11/01/2020 CLINICAL DATA:  Lower abdominal and pelvic pain EXAM: TRANSABDOMINAL AND TRANSVAGINAL ULTRASOUND OF PELVIS TECHNIQUE: Both transabdominal and transvaginal ultrasound examinations of the pelvis were performed. Transabdominal technique was performed for global imaging of the pelvis including uterus, ovaries, adnexal regions, and pelvic cul-de-sac. It was necessary to proceed with endovaginal exam following the transabdominal exam to visualize the uterus endometrium ovaries. COMPARISON:  Ultrasound 05/11/2015 FINDINGS: Uterus Measurements: 7.4 x 3.2 x 4.4 cm = volume: 54.7 mL. Large cervical cysts containing scattered internal echoes, the larger measures 2.1 cm but is unchanged in size compared to prior ultrasound. Anterior fundal intramural mass measuring 1.9 x 1.8 x 1.9 cm consistent with fibroid. Endometrium Thickness: 2.3 mm. Dense endometrial calcification measuring up to 9 mm in size, nonspecific and potentially due to prior infection or inflammatory process. Right ovary Not visualized Left ovary Not visualized. Other findings No abnormal free fluid. IMPRESSION: 1. Uterine fibroid. 2. Nonvisualized ovaries. Electronically Signed   By: Donavan Foil M.D.   On: 11/01/2020 15:13       Assessment &  Plan:   Problem List Items Addressed This Visit     Adrenal benign neoplasm    Has been evaluated by urology previously.  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  No further w/up warranted.         Anxiety    Continues on trazodone at night.  Overall appears to be handling things well.  Does not feel needs any further  intervention.  Follow.         CAD (coronary artery disease)    Followed by cardiology.  Stable.  Last evaluated 12/30/20.  Continue risk factor modification.        Essential hypertension    Currently on coreg, amlodipine, hctz, avapro and hydralazine.  Recently started aldactone. Follow pressures.  Still variation in pressures on multiple medications.  Discussed referral to nephrology.  Previous abdominal ultrasound - kidneys relatively equal size, etc.  Discussed - possible RAS, etc.  Refer to nephrology for further evaluation and treatment recommendations.        Relevant Orders   Ambulatory referral to Nephrology   TSH   Basic metabolic panel   Hypercholesterolemia    Continue lipitor.  Low cholesterol diet and exercise.  Follow lipid panel and liver function tests.        Relevant Orders   Hepatic function panel   Lipid panel   Neuropathy    NCS revealed polyneuropathy.  Continue lyrica.  Stable.         Thrombocytopenia ([place])    Follow cbc.        Relevant Orders   CBC with Differential/Platelet     Einar Pheasant, MD

## 2021-03-14 ENCOUNTER — Encounter: Payer: Self-pay | Admitting: Internal Medicine

## 2021-03-14 NOTE — Assessment & Plan Note (Signed)
Continue lipitor.  Low cholesterol diet and exercise.  Follow lipid panel and liver function tests.   

## 2021-03-14 NOTE — Assessment & Plan Note (Signed)
Followed by cardiology.  Stable.  Last evaluated 12/30/20.  Continue risk factor modification.

## 2021-03-14 NOTE — Assessment & Plan Note (Signed)
Currently on coreg, amlodipine, hctz, avapro and hydralazine.  Recently started aldactone. Follow pressures.  Still variation in pressures on multiple medications.  Discussed referral to nephrology.  Previous abdominal ultrasound - kidneys relatively equal size, etc.  Discussed - possible RAS, etc.  Refer to nephrology for further evaluation and treatment recommendations.

## 2021-03-14 NOTE — Assessment & Plan Note (Signed)
Has been evaluated by urology previously.  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  No further w/up warranted.   

## 2021-03-14 NOTE — Assessment & Plan Note (Signed)
Follow cbc.  

## 2021-03-14 NOTE — Assessment & Plan Note (Signed)
Continues on trazodone at night.  Overall appears to be handling things well.  Does not feel needs any further intervention.  Follow.

## 2021-03-14 NOTE — Assessment & Plan Note (Signed)
NCS revealed polyneuropathy.  Continue lyrica.  Stable.

## 2021-03-16 ENCOUNTER — Encounter: Payer: Self-pay | Admitting: Internal Medicine

## 2021-03-16 NOTE — Telephone Encounter (Signed)
Patient aware of below and is keeping appt

## 2021-03-16 NOTE — Telephone Encounter (Signed)
Dr Rolly Salter was on call and that is why I talked to him.  Ok to keep the scheduled appt with Dr Lanora Manis.  See me if questions.

## 2021-03-18 ENCOUNTER — Other Ambulatory Visit: Payer: Self-pay | Admitting: Internal Medicine

## 2021-04-18 ENCOUNTER — Other Ambulatory Visit: Payer: Self-pay | Admitting: Internal Medicine

## 2021-04-18 NOTE — Telephone Encounter (Signed)
RX Refill:Lyrica Last Seen:03-08-21 Last ordered:01-25-21

## 2021-04-25 ENCOUNTER — Other Ambulatory Visit: Payer: Self-pay | Admitting: Internal Medicine

## 2021-04-25 DIAGNOSIS — Z1231 Encounter for screening mammogram for malignant neoplasm of breast: Secondary | ICD-10-CM

## 2021-05-02 ENCOUNTER — Other Ambulatory Visit: Payer: Self-pay

## 2021-05-02 ENCOUNTER — Other Ambulatory Visit (INDEPENDENT_AMBULATORY_CARE_PROVIDER_SITE_OTHER): Payer: PPO

## 2021-05-02 DIAGNOSIS — D696 Thrombocytopenia, unspecified: Secondary | ICD-10-CM | POA: Diagnosis not present

## 2021-05-02 DIAGNOSIS — E7800 Pure hypercholesterolemia, unspecified: Secondary | ICD-10-CM | POA: Diagnosis not present

## 2021-05-02 DIAGNOSIS — I10 Essential (primary) hypertension: Secondary | ICD-10-CM | POA: Diagnosis not present

## 2021-05-02 LAB — CBC WITH DIFFERENTIAL/PLATELET
Basophils Absolute: 0.1 10*3/uL (ref 0.0–0.1)
Basophils Relative: 1.6 % (ref 0.0–3.0)
Eosinophils Absolute: 0.2 10*3/uL (ref 0.0–0.7)
Eosinophils Relative: 4.6 % (ref 0.0–5.0)
HCT: 38.7 % (ref 36.0–46.0)
Hemoglobin: 13 g/dL (ref 12.0–15.0)
Lymphocytes Relative: 35.7 % (ref 12.0–46.0)
Lymphs Abs: 1.8 10*3/uL (ref 0.7–4.0)
MCHC: 33.6 g/dL (ref 30.0–36.0)
MCV: 92.7 fl (ref 78.0–100.0)
Monocytes Absolute: 0.5 10*3/uL (ref 0.1–1.0)
Monocytes Relative: 11 % (ref 3.0–12.0)
Neutro Abs: 2.3 10*3/uL (ref 1.4–7.7)
Neutrophils Relative %: 47.1 % (ref 43.0–77.0)
Platelets: 152 10*3/uL (ref 150.0–400.0)
RBC: 4.17 Mil/uL (ref 3.87–5.11)
RDW: 12.8 % (ref 11.5–15.5)
WBC: 5 10*3/uL (ref 4.0–10.5)

## 2021-05-02 LAB — LIPID PANEL
Cholesterol: 100 mg/dL (ref 0–200)
HDL: 36 mg/dL — ABNORMAL LOW (ref >39.00)
LDL Cholesterol: 51 mg/dL (ref 0–99)
NonHDL: 63.72
Total CHOL/HDL Ratio: 3
Triglycerides: 65 mg/dL (ref 0.0–149.0)
VLDL: 13 mg/dL (ref 0.0–40.0)

## 2021-05-02 LAB — BASIC METABOLIC PANEL
BUN: 14 mg/dL (ref 6–23)
CO2: 30 mEq/L (ref 19–32)
Calcium: 10.1 mg/dL (ref 8.4–10.5)
Chloride: 103 mEq/L (ref 96–112)
Creatinine, Ser: 0.87 mg/dL (ref 0.40–1.20)
GFR: 67.73 mL/min (ref >60.00)
Glucose, Bld: 75 mg/dL (ref 70–99)
Potassium: 4 mEq/L (ref 3.5–5.1)
Sodium: 139 mEq/L (ref 135–145)

## 2021-05-02 LAB — HEPATIC FUNCTION PANEL
ALT: 16 U/L (ref 0–35)
AST: 16 U/L (ref 0–37)
Albumin: 4.1 g/dL (ref 3.5–5.2)
Alkaline Phosphatase: 30 U/L — ABNORMAL LOW (ref 39–117)
Bilirubin, Direct: 0.1 mg/dL (ref 0.0–0.3)
Total Bilirubin: 0.6 mg/dL (ref 0.2–1.2)
Total Protein: 6.3 g/dL (ref 6.0–8.3)

## 2021-05-02 LAB — TSH: TSH: 3.1 u[IU]/mL (ref 0.35–5.50)

## 2021-05-04 ENCOUNTER — Other Ambulatory Visit: Payer: Self-pay

## 2021-05-04 ENCOUNTER — Ambulatory Visit (INDEPENDENT_AMBULATORY_CARE_PROVIDER_SITE_OTHER): Payer: PPO | Admitting: Internal Medicine

## 2021-05-04 DIAGNOSIS — R1909 Other intra-abdominal and pelvic swelling, mass and lump: Secondary | ICD-10-CM | POA: Diagnosis not present

## 2021-05-04 DIAGNOSIS — I10 Essential (primary) hypertension: Secondary | ICD-10-CM

## 2021-05-04 DIAGNOSIS — F419 Anxiety disorder, unspecified: Secondary | ICD-10-CM

## 2021-05-04 DIAGNOSIS — T82898A Other specified complication of vascular prosthetic devices, implants and grafts, initial encounter: Secondary | ICD-10-CM | POA: Insufficient documentation

## 2021-05-04 DIAGNOSIS — R55 Syncope and collapse: Secondary | ICD-10-CM | POA: Insufficient documentation

## 2021-05-04 DIAGNOSIS — I509 Heart failure, unspecified: Secondary | ICD-10-CM | POA: Insufficient documentation

## 2021-05-04 DIAGNOSIS — K2100 Gastro-esophageal reflux disease with esophagitis, without bleeding: Secondary | ICD-10-CM

## 2021-05-04 DIAGNOSIS — D696 Thrombocytopenia, unspecified: Secondary | ICD-10-CM

## 2021-05-04 DIAGNOSIS — G629 Polyneuropathy, unspecified: Secondary | ICD-10-CM

## 2021-05-04 DIAGNOSIS — I2510 Atherosclerotic heart disease of native coronary artery without angina pectoris: Secondary | ICD-10-CM

## 2021-05-04 DIAGNOSIS — E278 Other specified disorders of adrenal gland: Secondary | ICD-10-CM | POA: Insufficient documentation

## 2021-05-04 DIAGNOSIS — E7800 Pure hypercholesterolemia, unspecified: Secondary | ICD-10-CM | POA: Diagnosis not present

## 2021-05-04 DIAGNOSIS — E538 Deficiency of other specified B group vitamins: Secondary | ICD-10-CM

## 2021-05-04 DIAGNOSIS — E876 Hypokalemia: Secondary | ICD-10-CM | POA: Diagnosis not present

## 2021-05-04 NOTE — Progress Notes (Addendum)
Subjective:    [name], female    DOB: [birth year], 70 y.o.   MRN: [id number]  This visit occurred during the SARS-CoV-2 public health emergency.  Safety protocols were in place, including screening questions prior to the visit, additional usage of staff PPE, and extensive cleaning of exam room while observing appropriate contact time as indicated for disinfecting solutions.   Patient here for a scheduled follow up.  Chief Complaint  Patient presents with   Hypertension   .   Hypertension Pertinent negatives include no headaches or palpitations. Hypertension, follow-up -   BP Readings from Last 3 Encounters:  05/04/21 138/72  03/08/21 140/78  01/11/21 134/70   Wt Readings from Last 3 Encounters:  05/04/21 172 lb 3.2 oz (78.1 kg)  03/08/21 169 lb (76.7 kg)  01/11/21 171 lb (77.6 kg)     She was last seen for hypertension 2 months ago.  BP at that visit was elevated 140/78. Management since that visit includes she decreased - spironolactone from 25 mg q day to 12.5 mg q day. (Did not take aldactone yesterday).  Patient also stopped taking AM dose of hydralazine due to hypotension. She reports that she was out in the sun Saturday.  The next day, just did not feel well.  Described fatigue and felt as if she could pass out.  No LOC.  No vision loss.  When bent over - had to get up slowly.  No chest pain with above episode.  Notices occasional chest tightness.  No increased cough or congestion.  Was questioning if her medication was "too much" in am.  That is why she made adjustments as outlined.  She also reports taking lyrica in am.  Discussed decreasing dose of am lyrica.  Discussed referral to nephrology last visit, sees them this afternoon.  Blood pressure has been hard to control.  Had required multiple medications.  Reviewed outside blood pressures and readings vary - 98-140s/40-60s.  No nausea or vomiting.  No abdominal pain.  Bowels moving.  Sees Dr Nehemiah Massed.   12/28/20 - ECHO EF 55% with moderate TR/MR and mild LA enlargement.  Stress test 11/2020 - negative ischemia. Discussed f/u with cardiology.    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)   Heart murmur    History of chicken pox    Hyperlipidemia    Hypertension    Insomnia    Past Surgical History:  Procedure Laterality Date   APPENDECTOMY     BREAST EXCISIONAL BIOPSY Right    negative over 5 years ago- neg   BREAST SURGERY     Biopsy   COLONOSCOPY WITH PROPOFOL N/A 05/16/2016   Procedure: COLONOSCOPY WITH PROPOFOL;  Surgeon: [name], MD;  Location: [facility];  Service: Endoscopy;  Laterality: N/A;   CORONARY ARTERY BYPASS GRAFT  05/01/2014   3 vessel, [facility]   ESOPHAGOGASTRODUODENOSCOPY (EGD) WITH PROPOFOL N/A 02/23/2017   Procedure: ESOPHAGOGASTRODUODENOSCOPY (EGD) WITH PROPOFOL;  Surgeon: [name], MD;  Location: [facility];  Service: Endoscopy;  Laterality: N/A;   HYSTEROSCOPY WITH D & C     lipoma removed     removed from forehead   triple bypass     Family History  Problem Relation Age of Onset   Breast cancer Other        maternal great aunt   Heart  disease Other        multiple family members   Heart disease Mother    Stroke Mother    Alzheimer's disease Mother    Heart disease Father    Stroke Father    Breast cancer Sister 2   Alzheimer's disease Maternal Aunt    Alzheimer's disease Maternal Uncle    Alzheimer's disease Maternal Grandmother    Colon cancer Neg Hx    Diabetes Neg Hx    Ovarian cancer Neg Hx    Social History   Socioeconomic History   Marital status: Married    Spouse name: Not on file   Number of children: Not on file   Years of education: Not on file   Highest education level: Not on file  Occupational History   Not on file  Tobacco Use   Smoking status: Never   Smokeless tobacco: Never  Vaping Use   Vaping Use:  Never used  Substance and Sexual Activity   Alcohol use: Never    Alcohol/week: 0.0 standard drinks   Drug use: No   Sexual activity: Yes    Birth control/protection: Post-menopausal  Other Topics Concern   Not on file  Social History Narrative   Not on file   Social Determinants of Health   Financial Resource Strain: Low Risk    Difficulty of Paying Living Expenses: Not hard at all  Food Insecurity: No Food Insecurity   Worried About Charity fundraiser in the Last Year: Never true   [place] in the Last Year: Never true  Transportation Needs: No Transportation Needs   Lack of Transportation (Medical): No   Lack of Transportation (Non-Medical): No  Physical Activity: Sufficiently Active   Days of Exercise per Week: 3 days   Minutes of Exercise per Session: 50 min  Stress: No Stress Concern Present   Feeling of Stress : Not at all  Social Connections: Unknown   Frequency of Communication with Friends and Family: More than three times a week   Frequency of Social Gatherings with Friends and Family: More than three times a week   Attends Religious Services: More than 4 times per year   Active Member of [employer] or Organizations: Yes   Attends Music therapist: More than 4 times per year   Marital Status: Not on file    Review of Systems  Constitutional:  Negative for appetite change and unexpected weight change.  HENT:  Negative for congestion and sinus pressure.   Respiratory:  Negative for cough.        Chest tightness as outlined.    Cardiovascular:  Negative for palpitations and leg swelling.       Near syncope as outlined.   Gastrointestinal:  Negative for abdominal pain, diarrhea, nausea and vomiting.  Genitourinary:  Negative for difficulty urinating and dysuria.  Musculoskeletal:  Negative for joint swelling and myalgias.  Skin:  Negative for color change and rash.  Neurological:  Negative for dizziness, light-headedness and headaches.   Psychiatric/Behavioral:  Negative for agitation and dysphoric mood.       Objective:     BP 138/72   Pulse 65   Temp 97.9 F (36.6 C)   Resp 16   Ht '5\' 10"'$  (1.778 m)   Wt 172 lb 3.2 oz (78.1 kg)   SpO2 99%   BMI 24.71 kg/m  Wt Readings from Last 3 Encounters:  05/04/21 172 lb 3.2 oz (78.1 kg)  03/08/21 169 lb (76.7  kg)  01/11/21 171 lb (77.6 kg)    Physical Exam Vitals reviewed.  Constitutional:      General: She is not in acute distress.    Appearance: Normal appearance.  HENT:     Head: Normocephalic and atraumatic.     Right Ear: External ear normal.     Left Ear: External ear normal.  Eyes:     General: No scleral icterus.       Right eye: No discharge.        Left eye: No discharge.     Conjunctiva/sclera: Conjunctivae normal.  Neck:     Thyroid: No thyromegaly.  Cardiovascular:     Rate and Rhythm: Normal rate and regular rhythm.  Pulmonary:     Effort: No respiratory distress.     Breath sounds: Normal breath sounds. No wheezing.  Abdominal:     General: Bowel sounds are normal.     Palpations: Abdomen is soft.     Tenderness: There is no abdominal tenderness.  Musculoskeletal:        General: No swelling or tenderness.     Cervical back: Neck supple. No tenderness.  Lymphadenopathy:     Cervical: No cervical adenopathy.  Skin:    Findings: No erythema or rash.  Neurological:     Mental Status: She is alert.  Psychiatric:        Mood and Affect: Mood normal.        Behavior: Behavior normal.     Outpatient Encounter Medications as of 05/04/2021  Medication Sig   Probiotic Product (PROBIOTIC PO) Take by mouth.   Alpha-Lipoic Acid 100 MG CAPS Take 600 mg by mouth daily.   amLODipine (NORVASC) 10 MG tablet Take 10 mg by mouth daily.   ascorbic acid (VITAMIN C) 1000 MG tablet Take by mouth.   aspirin EC 81 MG tablet Take 81 mg by mouth daily.   atorvastatin (LIPITOR) 40 MG tablet Take by mouth.   calcium-vitamin D (OSCAL WITH D) 500-200  MG-UNIT TABS tablet Take by mouth.   carvedilol (COREG) 6.25 MG tablet Take 6.25 mg by mouth 2 (two) times daily.   cyanocobalamin 1000 MCG tablet Take 1,000 mcg by mouth daily.   Docusate Calcium (STOOL SOFTENER PO) Take by mouth.   hydrALAZINE (APRESOLINE) 50 MG tablet Take 1 tablet by mouth daily.   hydrochlorothiazide (HYDRODIURIL) 25 MG tablet TAKE 1 TABLET (25 MG TOTAL) BY MOUTH ONCE DAILY.   irbesartan (AVAPRO) 300 MG tablet Take 300 mg by mouth daily.   Omega-3 Fatty Acids (FISH OIL) 1000 MG CAPS Take 1,200 mg by mouth daily.   pantoprazole (PROTONIX) 40 MG tablet TAKE 1 TABLET (40 MG TOTAL) BY MOUTH DAILY. TAKE 30 MINUTES BEFORE BREAKFAST   pregabalin (LYRICA) 75 MG capsule TAKE 1 TO 2 CAPSULES BY MOUTH EVERY MORNING AND 2 CAPSULES BY MOUTH EVERY EVENING   rOPINIRole (REQUIP) 0.25 MG tablet Take 0.25 mg by mouth at bedtime.   spironolactone (ALDACTONE) 25 MG tablet Take 12.5 mg by mouth daily.   traZODone (DESYREL) 50 MG tablet TAKE 1-2 TABLETS (50-100 MG TOTAL) BY MOUTH AT BEDTIME AS NEEDED.   No facility-administered encounter medications on file as of 05/04/2021.     Lab Results  Component Value Date   WBC 5.0 05/02/2021   HGB 13.0 05/02/2021   HCT 38.7 05/02/2021   PLT 152.0 05/02/2021   GLUCOSE 75 05/02/2021   CHOL 100 05/02/2021   TRIG 65.0 05/02/2021   HDL 36.00 (L) 05/02/2021  LDLCALC 51 05/02/2021   ALT 16 05/02/2021   AST 16 05/02/2021   NA 139 05/02/2021   K 4.0 05/02/2021   CL 103 05/02/2021   CREATININE 0.87 05/02/2021   BUN 14 05/02/2021   CO2 30 05/02/2021   TSH 3.10 05/02/2021    US PELVIC COMPLETE WITH TRANSVAGINAL  Result Date: 11/01/2020 CLINICAL DATA:  Lower abdominal and pelvic pain EXAM: TRANSABDOMINAL AND TRANSVAGINAL ULTRASOUND OF PELVIS TECHNIQUE: Both transabdominal and transvaginal ultrasound examinations of the pelvis were performed. Transabdominal technique was performed for global imaging of the pelvis including uterus, ovaries, adnexal  regions, and pelvic cul-de-sac. It was necessary to proceed with endovaginal exam following the transabdominal exam to visualize the uterus endometrium ovaries. COMPARISON:  Ultrasound 05/11/2015 FINDINGS: Uterus Measurements: 7.4 x 3.2 x 4.4 cm = volume: 54.7 mL. Large cervical cysts containing scattered internal echoes, the larger measures 2.1 cm but is unchanged in size compared to prior ultrasound. Anterior fundal intramural mass measuring 1.9 x 1.8 x 1.9 cm consistent with fibroid. Endometrium Thickness: 2.3 mm. Dense endometrial calcification measuring up to 9 mm in size, nonspecific and potentially due to prior infection or inflammatory process. Right ovary Not visualized Left ovary Not visualized. Other findings No abnormal free fluid. IMPRESSION: 1. Uterine fibroid. 2. Nonvisualized ovaries. Electronically Signed   By: Donavan Foil M.D.   On: 11/01/2020 15:13       Assessment & Plan:   Problem List Items Addressed This Visit     Anxiety    Overall appears to be doing well.  Does not feel needs any further intervention.  Follow. Takes trazodone q hs.       B12 deficiency    Continue B12 supplements.        CAD (coronary artery disease)    Has known CAD as outlined.  Sees Dr Nehemiah Massed.  Recent w/up and testing as outlined.  Had the near syncopal episode as described.  Adjust blood pressure medication and lyrica dosing as outlined.  Follow pressures.  She is describing some intermittent chest tightness - contact cardiology for earlier f/u with question of need for any further testing.  Continue risk factor modification.       Esophagitis, reflux    No upper symptoms reported.  On protonix.        Essential hypertension    Currently on coreg, amlodipine, hctz, avapro and has been on hydralazine.  Recently started aldactone. Still variation in pressures on multiple medications.  Recent concern regarding low blood pressure and near syncope as outlined.  Discussed stopping hydralazine.   Continue aldactone.  Previous abdominal ultrasound - kidneys relatively equal size, etc. Discussed - possible RAS. Has appt with nephrology this pm.       Hypercholesterolemia    Continue lipitor.  Low cholesterol diet and exercise.  Follow lipid panel and liver function tests.       Near syncope    Near syncopal episode recently.  Discussed.  Discussed changing medication and staying hydrated.  Will stop hydralazine.  Continue aldactone as directed.  Stay hydrated.  EKG - SR with TWI precordial leads - unchanged - no acute ischemic changes.  Follow closely. Has appt with nephrology this pm to discuss further blood pressure managemet.  Also discussed lyrica dosing.  Decrease am dose.        Relevant Orders   EKG 12-Lead (Completed)   Neuropathy    NCS revealed polyneuropathy.  Continue lyrica.  Will decrease am dose as outlined.  Follow.  Thrombocytopenia (HCC)    Platelet count just checked and wnl.         The entirety of the information documented in the History of Present Illness, Review of Systems and Physical Exam were personally obtained by me. Portions of this information were initially documented by the CMA and reviewed by me for thoroughness and accuracy.    Einar Pheasant, MD

## 2021-05-05 ENCOUNTER — Other Ambulatory Visit: Payer: Self-pay | Admitting: Nephrology

## 2021-05-05 ENCOUNTER — Other Ambulatory Visit (HOSPITAL_BASED_OUTPATIENT_CLINIC_OR_DEPARTMENT_OTHER): Payer: Self-pay | Admitting: Nephrology

## 2021-05-05 DIAGNOSIS — I2510 Atherosclerotic heart disease of native coronary artery without angina pectoris: Secondary | ICD-10-CM

## 2021-05-05 DIAGNOSIS — I10 Essential (primary) hypertension: Secondary | ICD-10-CM

## 2021-05-05 DIAGNOSIS — T82898A Other specified complication of vascular prosthetic devices, implants and grafts, initial encounter: Secondary | ICD-10-CM

## 2021-05-05 DIAGNOSIS — E278 Other specified disorders of adrenal gland: Secondary | ICD-10-CM

## 2021-05-06 DIAGNOSIS — D2261 Melanocytic nevi of right upper limb, including shoulder: Secondary | ICD-10-CM | POA: Diagnosis not present

## 2021-05-06 DIAGNOSIS — X32XXXA Exposure to sunlight, initial encounter: Secondary | ICD-10-CM | POA: Diagnosis not present

## 2021-05-06 DIAGNOSIS — L570 Actinic keratosis: Secondary | ICD-10-CM | POA: Diagnosis not present

## 2021-05-06 DIAGNOSIS — D2271 Melanocytic nevi of right lower limb, including hip: Secondary | ICD-10-CM | POA: Diagnosis not present

## 2021-05-06 DIAGNOSIS — D2262 Melanocytic nevi of left upper limb, including shoulder: Secondary | ICD-10-CM | POA: Diagnosis not present

## 2021-05-06 DIAGNOSIS — D225 Melanocytic nevi of trunk: Secondary | ICD-10-CM | POA: Diagnosis not present

## 2021-05-06 DIAGNOSIS — Z85828 Personal history of other malignant neoplasm of skin: Secondary | ICD-10-CM | POA: Diagnosis not present

## 2021-05-15 ENCOUNTER — Telehealth: Payer: Self-pay | Admitting: Internal Medicine

## 2021-05-15 ENCOUNTER — Encounter: Payer: Self-pay | Admitting: Internal Medicine

## 2021-05-15 NOTE — Assessment & Plan Note (Signed)
Platelet count just checked and wnl.

## 2021-05-15 NOTE — Assessment & Plan Note (Signed)
Has known CAD as outlined.  Sees Dr Nehemiah Massed.  Recent w/up and testing as outlined.  Had the near syncopal episode as described.  Adjust blood pressure medication and lyrica dosing as outlined.  Follow pressures.  She is describing some intermittent chest tightness - contact cardiology for earlier f/u with question of need for any further testing.  Continue risk factor modification.

## 2021-05-15 NOTE — Telephone Encounter (Signed)
Recently evaluated.  Had recommended earlier f/u with Dr Nehemiah Massed.  Was earlier appt arranged.  Also, she previously had been seeing Dr Enzo Bi for her gyn care and f/u previous abnormal pap smear.  He has retired.  Worked at [employer]. Please call [facility] and see if they can send last pap and office note.

## 2021-05-15 NOTE — Assessment & Plan Note (Signed)
Continue lipitor.  Low cholesterol diet and exercise.  Follow lipid panel and liver function tests.   

## 2021-05-15 NOTE — Assessment & Plan Note (Signed)
NCS revealed polyneuropathy.  Continue lyrica.  Will decrease am dose as outlined.  Follow.

## 2021-05-15 NOTE — Assessment & Plan Note (Addendum)
Near syncopal episode recently.  Discussed.  Discussed changing medication and staying hydrated.  Will stop hydralazine.  Continue aldactone as directed.  Stay hydrated.  EKG - SR with TWI precordial leads - unchanged - no acute ischemic changes.  Follow closely. Has appt with nephrology this pm to discuss further blood pressure managemet.  Also discussed lyrica dosing.  Decrease am dose.

## 2021-05-15 NOTE — Assessment & Plan Note (Signed)
Overall appears to be doing well.  Does not feel needs any further intervention.  Follow. Takes trazodone q hs.

## 2021-05-15 NOTE — Assessment & Plan Note (Signed)
Currently on coreg, amlodipine, hctz, avapro and has been on hydralazine.  Recently started aldactone. Still variation in pressures on multiple medications.  Recent concern regarding low blood pressure and near syncope as outlined.  Discussed stopping hydralazine.  Continue aldactone.  Previous abdominal ultrasound - kidneys relatively equal size, etc. Discussed - possible RAS. Has appt with nephrology this pm.

## 2021-05-15 NOTE — Assessment & Plan Note (Signed)
Continue B12 supplements.  

## 2021-05-15 NOTE — Assessment & Plan Note (Signed)
No upper symptoms reported.  On protonix.   

## 2021-05-17 ENCOUNTER — Encounter: Payer: Self-pay | Admitting: Internal Medicine

## 2021-05-17 NOTE — Telephone Encounter (Signed)
Attempted to reach [facility] to get last pap and office note. Recording stated that office was closed. Will try again.

## 2021-05-18 ENCOUNTER — Ambulatory Visit
Admission: RE | Admit: 2021-05-18 | Discharge: 2021-05-18 | Disposition: A | Discharge status: Home / Self Care | Payer: PPO | Source: Ambulatory Visit | Attending: Nephrology | Admitting: Nephrology

## 2021-05-18 ENCOUNTER — Other Ambulatory Visit: Payer: Self-pay

## 2021-05-18 DIAGNOSIS — R1909 Other intra-abdominal and pelvic swelling, mass and lump: Secondary | ICD-10-CM | POA: Insufficient documentation

## 2021-05-18 DIAGNOSIS — T82898A Other specified complication of vascular prosthetic devices, implants and grafts, initial encounter: Secondary | ICD-10-CM | POA: Diagnosis not present

## 2021-05-18 DIAGNOSIS — I2510 Atherosclerotic heart disease of native coronary artery without angina pectoris: Secondary | ICD-10-CM | POA: Diagnosis not present

## 2021-05-18 DIAGNOSIS — X58XXXA Exposure to other specified factors, initial encounter: Secondary | ICD-10-CM | POA: Diagnosis not present

## 2021-05-18 DIAGNOSIS — N2889 Other specified disorders of kidney and ureter: Secondary | ICD-10-CM | POA: Diagnosis not present

## 2021-05-18 DIAGNOSIS — I10 Essential (primary) hypertension: Secondary | ICD-10-CM | POA: Diagnosis not present

## 2021-05-18 DIAGNOSIS — N289 Disorder of kidney and ureter, unspecified: Secondary | ICD-10-CM | POA: Diagnosis not present

## 2021-05-18 DIAGNOSIS — E278 Other specified disorders of adrenal gland: Secondary | ICD-10-CM

## 2021-05-18 NOTE — Telephone Encounter (Signed)
Patient aware of appt date and time. 

## 2021-05-18 NOTE — Telephone Encounter (Signed)
Patient scheduled to see Dr Nehemiah Massed 9/22 at 10:45. Faxed record request to [facility]

## 2021-05-23 ENCOUNTER — Ambulatory Visit
Admission: RE | Admit: 2021-05-23 | Discharge: 2021-05-23 | Disposition: A | Discharge status: Home / Self Care | Payer: PPO | Source: Ambulatory Visit | Attending: Internal Medicine | Admitting: Internal Medicine

## 2021-05-23 ENCOUNTER — Other Ambulatory Visit: Payer: Self-pay

## 2021-05-23 DIAGNOSIS — Z1231 Encounter for screening mammogram for malignant neoplasm of breast: Secondary | ICD-10-CM | POA: Diagnosis not present

## 2021-05-26 ENCOUNTER — Encounter: Payer: Self-pay | Admitting: Internal Medicine

## 2021-05-26 DIAGNOSIS — N289 Disorder of kidney and ureter, unspecified: Secondary | ICD-10-CM

## 2021-05-26 DIAGNOSIS — D3502 Benign neoplasm of left adrenal gland: Secondary | ICD-10-CM

## 2021-05-26 DIAGNOSIS — R93429 Abnormal radiologic findings on diagnostic imaging of unspecified kidney: Secondary | ICD-10-CM

## 2021-05-26 NOTE — Telephone Encounter (Signed)
Please call Ms Ryerson and notify her that I reviewed her records. She has seen urology previously for evaluation.  At that time, they felt no further w/up warranted.  If she is agreeable, I can refer her to urology for further evaluation and to confirm if any further w/up warranted.

## 2021-05-30 NOTE — Telephone Encounter (Signed)
Discussed below with patient and pended referral to [facility] Please review what I have in there and confirm info is correct

## 2021-05-30 NOTE — Telephone Encounter (Signed)
Order signed for urology referral.

## 2021-06-02 DIAGNOSIS — I10 Essential (primary) hypertension: Secondary | ICD-10-CM | POA: Diagnosis not present

## 2021-06-10 ENCOUNTER — Ambulatory Visit (INDEPENDENT_AMBULATORY_CARE_PROVIDER_SITE_OTHER): Payer: PPO | Admitting: Urology

## 2021-06-10 ENCOUNTER — Other Ambulatory Visit: Payer: Self-pay

## 2021-06-10 ENCOUNTER — Encounter: Payer: Self-pay | Admitting: Urology

## 2021-06-10 VITALS — BP 153/72 | HR 70 | Ht 70.0 in | Wt 165.0 lb

## 2021-06-10 DIAGNOSIS — D1779 Benign lipomatous neoplasm of other sites: Secondary | ICD-10-CM | POA: Diagnosis not present

## 2021-06-10 DIAGNOSIS — N289 Disorder of kidney and ureter, unspecified: Secondary | ICD-10-CM

## 2021-06-10 LAB — URINALYSIS, COMPLETE
Bilirubin, UA: NEGATIVE
Glucose, UA: NEGATIVE
Nitrite, UA: NEGATIVE
Protein,UA: NEGATIVE
RBC, UA: NEGATIVE
Specific Gravity, UA: 1.015 (ref 1.005–1.030)
Urobilinogen, Ur: 0.2 mg/dL (ref 0.2–1.0)
pH, UA: 7 (ref 5.0–7.5)

## 2021-06-10 LAB — MICROSCOPIC EXAMINATION: Bacteria, UA: NONE SEEN

## 2021-06-10 NOTE — Progress Notes (Signed)
06/10/2021 5:22 PM   Alexandra Mendoza 01-Mar-1951 [id number]  Referring provider: Einar Pheasant, MD [address] [place],  [place] [address]  Chief Complaint  Patient presents with   New Patient (Initial Visit)    HPI: Alexandra Mendoza is a 70 y.o. female referred for evaluation of a left renal mass.  RUS 05/28/2021 showed a 2.7 cm hypoechoic lesion adjacent to the upper pole of the left kidney which was felt to either represent a left adrenal mass or an exophytic left renal mass Lesion noted on ultrasound 2018 and measured 2.4 cm A CT abdomen/pelvis with and without contrast was performed January 2014 and although the images are not available for review there was noted to be a 2.1 x 2.2 x 1.7 cm left adrenal mass which was predominantly fat density.  MRI was recommended at that time. She was seen by [facility] April 2014 for the adrenal mass.  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)   Heart murmur    History of chicken pox    Hyperlipidemia    Hypertension    Insomnia     Surgical History: Past Surgical History:  Procedure Laterality Date   APPENDECTOMY     BREAST EXCISIONAL BIOPSY Right    negative over 5 years ago- neg   BREAST SURGERY     Biopsy   COLONOSCOPY WITH PROPOFOL N/A 05/16/2016   Procedure: COLONOSCOPY WITH PROPOFOL;  Surgeon: [name], MD;  Location: [facility];  Service: Endoscopy;  Laterality: N/A;   CORONARY ARTERY BYPASS GRAFT  05/01/2014   3 vessel, [facility]   ESOPHAGOGASTRODUODENOSCOPY (EGD) WITH PROPOFOL N/A 02/23/2017   Procedure: ESOPHAGOGASTRODUODENOSCOPY (EGD) WITH  PROPOFOL;  Surgeon: [name], MD;  Location: [facility];  Service: Endoscopy;  Laterality: N/A;   HYSTEROSCOPY WITH D & C     lipoma removed     removed from forehead   triple bypass      Home Medications:  Allergies as of 06/10/2021       Reactions   Valsartan Hives, Rash   Hydralazine    Other reaction(s): Dizziness Patient felt like she was going to pass out         Medication List        Accurate as of June 10, 2021  5:22 PM. If you have any questions, ask your nurse or doctor.          STOP taking these medications    hydrALAZINE 50 MG tablet Commonly known as: APRESOLINE Stopped by: Abbie Sons, MD       TAKE these medications    Alpha-Lipoic Acid 100 MG Caps Take 600 mg by mouth daily.   amLODipine 10 MG tablet Commonly known as: NORVASC Take 10 mg by mouth daily.   ascorbic acid 1000 MG tablet Commonly known as: VITAMIN C Take by mouth.   aspirin EC 81 MG tablet Take 81 mg by mouth daily.   atorvastatin 40 MG tablet Commonly known as: LIPITOR Take by mouth.   calcium-vitamin D 500-200 MG-UNIT Tabs tablet Commonly known as: OSCAL WITH D Take  by mouth.   carvedilol 6.25 MG tablet Commonly known as: COREG Take 6.25 mg by mouth 2 (two) times daily.   cyanocobalamin 1000 MCG tablet Take 1,000 mcg by mouth daily.   Fish Oil 1000 MG Caps Take 1,200 mg by mouth daily.   hydrochlorothiazide 25 MG tablet Commonly known as: HYDRODIURIL TAKE 1 TABLET (25 MG TOTAL) BY MOUTH ONCE DAILY.   irbesartan 300 MG tablet Commonly known as: AVAPRO Take 300 mg by mouth daily.   pantoprazole 40 MG tablet Commonly known as: PROTONIX TAKE 1 TABLET (40 MG TOTAL) BY MOUTH DAILY. TAKE 30 MINUTES BEFORE BREAKFAST   pregabalin 75 MG capsule Commonly known as: LYRICA TAKE 1 TO 2 CAPSULES BY MOUTH EVERY MORNING AND 2 CAPSULES BY MOUTH EVERY EVENING   PROBIOTIC PO Take by mouth.   rOPINIRole 0.25 MG tablet Commonly known as:  REQUIP Take 0.25 mg by mouth at bedtime.   spironolactone 25 MG tablet Commonly known as: ALDACTONE Take 12.5 mg by mouth daily.   STOOL SOFTENER PO Take by mouth.   traZODone 50 MG tablet Commonly known as: DESYREL TAKE 1-2 TABLETS (50-100 MG TOTAL) BY MOUTH AT BEDTIME AS NEEDED.        Allergies:  Allergies  Allergen Reactions   Valsartan Hives and Rash   Hydralazine     Other reaction(s): Dizziness Patient felt like she was going to pass out     Family History: Family History  Problem Relation Age of Onset   Breast cancer Other        maternal great aunt   Heart disease Other        multiple family members   Heart disease Mother    Stroke Mother    Alzheimer's disease Mother    Heart disease Father    Stroke Father    Breast cancer Sister 68   Alzheimer's disease Maternal Aunt    Alzheimer's disease Maternal Uncle    Alzheimer's disease Maternal Grandmother    Colon cancer Neg Hx    Diabetes Neg Hx    Ovarian cancer Neg Hx     Social History:  reports that she has never smoked. She has never used smokeless tobacco. She reports that she does not drink alcohol and does not use drugs.   Physical Exam: BP (!) 153/72   Pulse 70   Ht 5\' 10"  (1.778 m)   Wt 165 lb (74.8 kg)   BMI 23.68 kg/m   Constitutional:  Alert and oriented, No acute distress. HEENT: [place] AT, moist mucus membranes.  Trachea midline, no masses. Cardiovascular: No clubbing, cyanosis, or edema. Respiratory: Normal respiratory effort, no increased work of breathing.  Laboratory  Urinalysis 06/10/2021: Dipstick/microscopy negative   Pertinent Imaging: RUS images were personally reviewed and interpreted  US RENAL  Narrative CLINICAL DATA:  Hypertension, unspecified type  EXAM: RENAL / URINARY TRACT ULTRASOUND COMPLETE  COMPARISON:  None.  FINDINGS: Right Kidney:  Renal measurements: 9.1 x 4.3 x 5.0 cm = volume: 105.3 mL. Echogenicity within normal limits. No mass or  hydronephrosis visualized.  Left Kidney:  Renal measurements: 10.3 x 5.2 x 4.7 cm = volume: 131.3 mL. There is a hypoechoic lesion adjacent to the upper pole the left kidney measuring 2.7 x 2.2 x 2.4 cm. This previously measured 2.4 x 2.3 x 2.0 cm.  Bladder:  Appears normal for degree of bladder distention.  Other:  None.  IMPRESSION: 2.7 cm hypoechoic lesion adjacent to the upper pole the left kidney, compatible with  either a left adrenal nodule or exophytic left renal lesion. This previously measured 2.4 cm in 2018. Multiphase contrast enhanced CT or MRI would be useful for further characterization.   Electronically Signed By: Maurine Simmering M.D. On: 05/20/2021 17:14    Assessment & Plan:    1.  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

## 2021-06-15 ENCOUNTER — Ambulatory Visit: Payer: PPO | Admitting: Urology

## 2021-06-27 ENCOUNTER — Ambulatory Visit: Payer: Self-pay

## 2021-06-28 ENCOUNTER — Ambulatory Visit
Admission: RE | Admit: 2021-06-28 | Discharge: 2021-06-28 | Disposition: A | Discharge status: Home / Self Care | Payer: PPO | Source: Ambulatory Visit | Attending: Urology | Admitting: Urology

## 2021-06-28 ENCOUNTER — Other Ambulatory Visit: Payer: Self-pay

## 2021-06-28 DIAGNOSIS — D1779 Benign lipomatous neoplasm of other sites: Secondary | ICD-10-CM | POA: Diagnosis not present

## 2021-06-28 DIAGNOSIS — K449 Diaphragmatic hernia without obstruction or gangrene: Secondary | ICD-10-CM | POA: Insufficient documentation

## 2021-06-28 DIAGNOSIS — E278 Other specified disorders of adrenal gland: Secondary | ICD-10-CM | POA: Diagnosis not present

## 2021-06-28 DIAGNOSIS — I700 Atherosclerosis of aorta: Secondary | ICD-10-CM | POA: Insufficient documentation

## 2021-06-28 DIAGNOSIS — K8020 Calculus of gallbladder without cholecystitis without obstruction: Secondary | ICD-10-CM | POA: Insufficient documentation

## 2021-06-28 DIAGNOSIS — D3502 Benign neoplasm of left adrenal gland: Secondary | ICD-10-CM | POA: Diagnosis not present

## 2021-06-29 ENCOUNTER — Ambulatory Visit (INDEPENDENT_AMBULATORY_CARE_PROVIDER_SITE_OTHER): Payer: PPO

## 2021-06-29 ENCOUNTER — Other Ambulatory Visit: Payer: Self-pay

## 2021-06-29 DIAGNOSIS — D696 Thrombocytopenia, unspecified: Secondary | ICD-10-CM | POA: Diagnosis not present

## 2021-06-29 DIAGNOSIS — Z23 Encounter for immunization: Secondary | ICD-10-CM | POA: Diagnosis not present

## 2021-06-29 DIAGNOSIS — I509 Heart failure, unspecified: Secondary | ICD-10-CM | POA: Diagnosis not present

## 2021-06-29 DIAGNOSIS — R1909 Other intra-abdominal and pelvic swelling, mass and lump: Secondary | ICD-10-CM | POA: Diagnosis not present

## 2021-06-29 DIAGNOSIS — E876 Hypokalemia: Secondary | ICD-10-CM | POA: Diagnosis not present

## 2021-06-29 DIAGNOSIS — T82898A Other specified complication of vascular prosthetic devices, implants and grafts, initial encounter: Secondary | ICD-10-CM | POA: Diagnosis not present

## 2021-06-29 DIAGNOSIS — I2510 Atherosclerotic heart disease of native coronary artery without angina pectoris: Secondary | ICD-10-CM | POA: Diagnosis not present

## 2021-06-29 DIAGNOSIS — I10 Essential (primary) hypertension: Secondary | ICD-10-CM | POA: Diagnosis not present

## 2021-07-05 ENCOUNTER — Encounter: Payer: Self-pay | Admitting: Urology

## 2021-07-06 ENCOUNTER — Other Ambulatory Visit: Payer: Self-pay | Admitting: Family Medicine

## 2021-07-19 ENCOUNTER — Ambulatory Visit: Payer: PPO | Admitting: Obstetrics & Gynecology

## 2021-07-28 ENCOUNTER — Ambulatory Visit (INDEPENDENT_AMBULATORY_CARE_PROVIDER_SITE_OTHER): Payer: PPO | Admitting: Internal Medicine

## 2021-07-28 ENCOUNTER — Encounter: Payer: Self-pay | Admitting: Internal Medicine

## 2021-07-28 ENCOUNTER — Other Ambulatory Visit: Payer: Self-pay

## 2021-07-28 VITALS — BP 134/68 | HR 67 | Temp 98.0°F | Ht 70.0 in | Wt 171.4 lb

## 2021-07-28 DIAGNOSIS — D696 Thrombocytopenia, unspecified: Secondary | ICD-10-CM

## 2021-07-28 DIAGNOSIS — G629 Polyneuropathy, unspecified: Secondary | ICD-10-CM | POA: Diagnosis not present

## 2021-07-28 DIAGNOSIS — E7800 Pure hypercholesterolemia, unspecified: Secondary | ICD-10-CM

## 2021-07-28 DIAGNOSIS — I2510 Atherosclerotic heart disease of native coronary artery without angina pectoris: Secondary | ICD-10-CM | POA: Diagnosis not present

## 2021-07-28 DIAGNOSIS — E2839 Other primary ovarian failure: Secondary | ICD-10-CM | POA: Diagnosis not present

## 2021-07-28 DIAGNOSIS — Z1159 Encounter for screening for other viral diseases: Secondary | ICD-10-CM

## 2021-07-28 DIAGNOSIS — Z114 Encounter for screening for human immunodeficiency virus [HIV]: Secondary | ICD-10-CM

## 2021-07-28 DIAGNOSIS — I10 Essential (primary) hypertension: Secondary | ICD-10-CM | POA: Diagnosis not present

## 2021-07-28 DIAGNOSIS — K2100 Gastro-esophageal reflux disease with esophagitis, without bleeding: Secondary | ICD-10-CM

## 2021-07-28 DIAGNOSIS — D3502 Benign neoplasm of left adrenal gland: Secondary | ICD-10-CM | POA: Diagnosis not present

## 2021-07-28 DIAGNOSIS — F419 Anxiety disorder, unspecified: Secondary | ICD-10-CM | POA: Diagnosis not present

## 2021-07-28 DIAGNOSIS — M8580 Other specified disorders of bone density and structure, unspecified site: Secondary | ICD-10-CM

## 2021-07-28 MED ORDER — PREGABALIN 75 MG PO CAPS: 75.0000 mg | ORAL_CAPSULE | Freq: Two times a day (BID) | ORAL | 3 refills | Status: DC

## 2021-07-28 NOTE — Progress Notes (Signed)
[name], female   DOB: 02/01/1951, 70 y.o.   MRN: [id number]   Subjective:    [name], female    DOB: [birth year], 70 y.o.   MRN: [id number]  This visit occurred during the SARS-CoV-2 public health emergency.  Safety protocols were in place, including screening questions prior to the visit, additional usage of staff PPE, and extensive cleaning of exam room while observing appropriate contact time as indicated for disinfecting solutions.   Patient here for a scheduled follow up.    HPI Here to follow up regarding her blood pressure, neuropathy and cholesterol.  She is doing relatively well.  Saw nephrology.  No changes made.  Remains off hydralazine.  Blood pressure varying, but overall improved - 106-140/60s.  Saw Dr Nehemiah Massed 05/2021.  No changes in medication.  No chest pain or sob reported.  No abdominal pain.  Persistent pain with neuropathy.  On lyrica - moving to take one bid.  Discussed bone density.     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)   Heart murmur    History of chicken pox    Hyperlipidemia    Hypertension    Insomnia    Past Surgical History:  Procedure Laterality Date   APPENDECTOMY     BREAST EXCISIONAL BIOPSY Right    negative over 5 years ago- neg   BREAST SURGERY     Biopsy   COLONOSCOPY WITH PROPOFOL N/A 05/16/2016   Procedure: COLONOSCOPY WITH PROPOFOL;  Surgeon: [name], MD;  Location: [facility];  Service: Endoscopy;  Laterality: N/A;   CORONARY ARTERY BYPASS GRAFT  05/01/2014   3 vessel, [facility]   ESOPHAGOGASTRODUODENOSCOPY (EGD) WITH PROPOFOL N/A 02/23/2017   Procedure: ESOPHAGOGASTRODUODENOSCOPY (EGD) WITH PROPOFOL;  Surgeon: [name], MD;  Location: [place];  Service: Endoscopy;  Laterality: N/A;   HYSTEROSCOPY WITH D & C     lipoma removed     removed from forehead   triple bypass      Family History  Problem Relation Age of Onset   Breast cancer Other        maternal great aunt   Heart disease Other        multiple family members   Heart disease Mother    Stroke Mother    Alzheimer's disease Mother    Heart disease Father    Stroke Father    Breast cancer Sister 24   Alzheimer's disease Maternal Aunt    Alzheimer's disease Maternal Uncle    Alzheimer's disease Maternal Grandmother    Colon cancer Neg Hx    Diabetes Neg Hx    Ovarian cancer Neg Hx    Social History   Socioeconomic History   Marital status: Married    Spouse name: Not on file   Number of children: Not on file   Years of education: Not on file   Highest education level: Not on file  Occupational History   Not on file  Tobacco Use   Smoking status: Never   Smokeless tobacco: Never  Vaping Use   Vaping Use: Never used  Substance and Sexual Activity   Alcohol use: Never    Alcohol/week: 0.0 standard drinks   Drug use: No   Sexual activity: Yes    Birth control/protection: Post-menopausal  Other Topics Concern   Not on file  Social History Narrative   Not on file   Social Determinants of Health   Financial Resource Strain: Not on file  Food Insecurity: Not on file  Transportation Needs: Not on file  Physical Activity: Not on file  Stress: Not on file  Social Connections: Not on file     Review of Systems  Constitutional:  Negative for appetite change and unexpected weight change.  HENT:  Negative for congestion and sinus pressure.   Respiratory:  Negative for cough, chest tightness and shortness of breath.   Cardiovascular:  Negative for chest pain, palpitations and leg swelling.  Gastrointestinal:  Negative for abdominal pain, diarrhea, nausea and vomiting.  Genitourinary:  Negative for difficulty urinating and dysuria.  Musculoskeletal:  Negative for joint swelling and myalgias.  Skin:  Negative for color change and rash.  Neurological:  Negative for dizziness,  light-headedness and headaches.       Neuropathy pain.   Psychiatric/Behavioral:  Negative for agitation and dysphoric mood.       Objective:     BP 134/68 (BP Location: Left Arm, Patient Position: Sitting, Cuff Size: Normal)   Pulse 67   Temp 98 F (36.7 C) (Oral)   Ht 5\' 10"  (1.778 m)   Wt 171 lb 6.4 oz (77.7 kg)   SpO2 98%   BMI 24.59 kg/m  Wt Readings from Last 3 Encounters:  07/28/21 171 lb 6.4 oz (77.7 kg)  06/10/21 165 lb (74.8 kg)  05/04/21 172 lb 3.2 oz (78.1 kg)    Physical Exam Vitals reviewed.  Constitutional:      General: She is not in acute distress.    Appearance: Normal appearance.  HENT:     Head: Normocephalic and atraumatic.     Right Ear: External ear normal.     Left Ear: External ear normal.  Eyes:     General: No scleral icterus.       Right eye: No discharge.        Left eye: No discharge.     Conjunctiva/sclera: Conjunctivae normal.  Neck:     Thyroid: No thyromegaly.  Cardiovascular:     Rate and Rhythm: Normal rate and regular rhythm.  Pulmonary:     Effort: No respiratory distress.     Breath sounds: Normal breath sounds. No wheezing.  Abdominal:     General: Bowel sounds are normal.     Palpations: Abdomen is soft.     Tenderness: There is no abdominal tenderness.  Musculoskeletal:        General: No swelling or tenderness.     Cervical back: Neck supple. No tenderness.  Lymphadenopathy:     Cervical: No cervical adenopathy.  Skin:    Findings: No erythema or rash.  Neurological:     Mental Status: She is alert.  Psychiatric:        Mood and Affect: Mood normal.        Behavior: Behavior normal.     Outpatient Encounter Medications as of 07/28/2021  Medication Sig   Alpha-Lipoic Acid 100 MG CAPS Take 600 mg by mouth daily.   amLODipine (NORVASC) 10 MG tablet Take 10 mg by mouth daily.   ascorbic acid (VITAMIN C) 1000 MG tablet Take by mouth.   aspirin EC 81 MG tablet Take 81 mg by mouth daily.   calcium-vitamin D  (OSCAL WITH D) 500-200 MG-UNIT TABS tablet Take by mouth.   carvedilol (COREG) 6.25 MG tablet Take 6.25 mg by mouth 2 (two) times daily.  cyanocobalamin 1000 MCG tablet Take 1,000 mcg by mouth daily.   Docusate Calcium (STOOL SOFTENER PO) Take by mouth.   hydrochlorothiazide (HYDRODIURIL) 25 MG tablet TAKE 1 TABLET (25 MG TOTAL) BY MOUTH ONCE DAILY.   irbesartan (AVAPRO) 300 MG tablet Take 300 mg by mouth daily.   Omega-3 Fatty Acids (FISH OIL) 1000 MG CAPS Take 1,200 mg by mouth daily.   pantoprazole (PROTONIX) 40 MG tablet TAKE 1 TABLET (40 MG TOTAL) BY MOUTH DAILY. TAKE 30 MINUTES BEFORE BREAKFAST   Probiotic Product (PROBIOTIC PO) Take by mouth.   rOPINIRole (REQUIP) 0.25 MG tablet Take 0.25 mg by mouth at bedtime.   spironolactone (ALDACTONE) 25 MG tablet Take 25 mg by mouth daily.   traZODone (DESYREL) 50 MG tablet TAKE 1-2 TABLETS (50-100 MG TOTAL) BY MOUTH AT BEDTIME AS NEEDED.   [DISCONTINUED] pregabalin (LYRICA) 75 MG capsule TAKE 1 TO 2 CAPSULES BY MOUTH EVERY MORNING AND 2 CAPSULES BY MOUTH EVERY EVENING   atorvastatin (LIPITOR) 40 MG tablet Take by mouth.   pregabalin (LYRICA) 75 MG capsule Take 1 capsule (75 mg total) by mouth 2 (two) times daily.   No facility-administered encounter medications on file as of 07/28/2021.     Lab Results  Component Value Date   WBC 5.0 05/02/2021   HGB 13.0 05/02/2021   HCT 38.7 05/02/2021   PLT 152.0 05/02/2021   GLUCOSE 75 05/02/2021   CHOL 100 05/02/2021   TRIG 65.0 05/02/2021   HDL 36.00 (L) 05/02/2021   LDLCALC 51 05/02/2021   ALT 16 05/02/2021   AST 16 05/02/2021   NA 139 05/02/2021   K 4.0 05/02/2021   CL 103 05/02/2021   CREATININE 0.87 05/02/2021   BUN 14 05/02/2021   CO2 30 05/02/2021   TSH 3.10 05/02/2021    CT Abdomen Wo Contrast  Result Date: 06/28/2021 CLINICAL DATA:  Follow-up known left adrenal myelolipoma. EXAM: CT ABDOMEN WITHOUT CONTRAST TECHNIQUE: Multidetector CT imaging of the abdomen was performed  following the standard protocol without IV contrast. COMPARISON:  CT September 20, 2012 FINDINGS: Lower chest: Lingular scarring versus atelectasis. Hepatobiliary: Unremarkable noncontrast appearance of the hepatic parenchyma. Layering biliary sludge/stones without gallbladder wall thickening or pericholecystic fluid. No biliary ductal dilation. Pancreas: No pancreatic ductal dilation or evidence of acute inflammation. Spleen: Within normal limits. Adrenals/Urinary Tract: Slight interval decrease in size of the fat containing left adrenal nodule measuring 1.9 cm on today's examination previously 2.1 cm, previously characterized as a benign adrenal myelolipoma on adrenal protocol CT September 20, 2012. Right adrenal glands unremarkable. No hydronephrosis. No renal, ureteral or bladder calculi. No contour deforming renal mass. Stomach/Bowel: Small hiatal hernia otherwise the stomach is decompressed limiting evaluation. No pathologic dilation or evidence of acute inflammation involving loops of large or small bowel in the abdomen. Vascular/Lymphatic: Aortic atherosclerosis without abdominal aortic aneurysm. No pathologically enlarged abdominal lymph nodes. Other: No abdominal ascites. Musculoskeletal: Multilevel degenerative changes spine. No acute osseous abnormality. IMPRESSION: 1. Slight interval decrease in size of the benign left adrenal myelolipoma. 2. Cholelithiasis without evidence of acute cholecystitis. 3. Small hiatal hernia. 4. Aortic Atherosclerosis (ICD10-I70.0). Electronically Signed   By: Dahlia Bailiff M.D.   On: 06/28/2021 20:08       Assessment & Plan:   Problem List Items Addressed This Visit     Adrenal benign neoplasm    Has been evaluated by urology previously.  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  No further w/up warranted.  Anxiety    Continues on trazodone at night.  Overall appears to be handling things well.  Does not feel needs any further intervention.  Follow.         CAD (coronary artery disease)    Sees Dr Nehemiah Massed.  Continue risk factor modification.  Stable.       Esophagitis, reflux    No upper symptoms reported.  On protonix.        Essential hypertension    Currently on coreg, amlodipine, hctz, avapro and aldactone.  Previous abdominal ultrasound - kidneys relatively equal size, etc. Saw nephrology.  No changes made.  Pressure better.  Follow.        Relevant Orders   Basic metabolic panel   Hypercholesterolemia    Continue lipitor.  Low cholesterol diet and exercise.  Follow lipid panel and liver function tests.       Relevant Orders   Hepatic function panel   Lipid panel   Neuropathy    NCS revealed polyneuropathy.  Continue lyrica.  She plans to take one bid.   Follow.       Osteopenia    Recheck bone density.       Thrombocytopenia (HCC)    Last platelet count wnl.  Follow cbc.       Relevant Orders   CBC with Differential/Platelet   Other Visit Diagnoses     Estrogen deficiency    -  Primary   Relevant Orders   DG Bone Density   Encounter for hepatitis C screening test for low risk patient       Relevant Orders   Hepatitis C antibody   Screening for HIV without presence of risk factors       Relevant Orders   HIV antibody (with reflex)        Einar Pheasant, MD

## 2021-08-03 ENCOUNTER — Ambulatory Visit: Payer: PPO

## 2021-08-05 ENCOUNTER — Encounter: Payer: Self-pay | Admitting: Internal Medicine

## 2021-08-05 NOTE — Assessment & Plan Note (Signed)
Continue lipitor.  Low cholesterol diet and exercise.  Follow lipid panel and liver function tests.   

## 2021-08-05 NOTE — Assessment & Plan Note (Signed)
No upper symptoms reported.  On protonix.   

## 2021-08-05 NOTE — Assessment & Plan Note (Signed)
Recheck bone density.

## 2021-08-05 NOTE — Assessment & Plan Note (Signed)
Continues on trazodone at night.  Overall appears to be handling things well.  Does not feel needs any further intervention.  Follow.

## 2021-08-05 NOTE — Assessment & Plan Note (Signed)
Last platelet count wnl.  Follow cbc.

## 2021-08-05 NOTE — Assessment & Plan Note (Signed)
Has been evaluated by urology previously.  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  No further w/up warranted.   

## 2021-08-05 NOTE — Assessment & Plan Note (Signed)
Sees Dr Kowalski.  Continue risk factor modification.  Stable.  

## 2021-08-05 NOTE — Assessment & Plan Note (Signed)
NCS revealed polyneuropathy.  Continue lyrica.  She plans to take one bid.   Follow.

## 2021-08-05 NOTE — Assessment & Plan Note (Signed)
Currently on coreg, amlodipine, hctz, avapro and aldactone.  Previous abdominal ultrasound - kidneys relatively equal size, etc. Saw nephrology.  No changes made.  Pressure better.  Follow.   

## 2021-08-24 ENCOUNTER — Encounter: Payer: Self-pay | Admitting: Obstetrics & Gynecology

## 2021-08-24 ENCOUNTER — Ambulatory Visit (INDEPENDENT_AMBULATORY_CARE_PROVIDER_SITE_OTHER): Payer: PPO | Admitting: Obstetrics & Gynecology

## 2021-08-24 ENCOUNTER — Other Ambulatory Visit (HOSPITAL_COMMUNITY)
Admission: RE | Admit: 2021-08-24 | Discharge: 2021-08-24 | Disposition: A | Discharge status: Home / Self Care | Payer: PPO | Source: Ambulatory Visit | Attending: Obstetrics & Gynecology | Admitting: Obstetrics & Gynecology

## 2021-08-24 ENCOUNTER — Other Ambulatory Visit: Payer: Self-pay

## 2021-08-24 VITALS — BP 130/80 | Ht 69.5 in | Wt 172.0 lb

## 2021-08-24 DIAGNOSIS — Z1151 Encounter for screening for human papillomavirus (HPV): Secondary | ICD-10-CM | POA: Diagnosis not present

## 2021-08-24 DIAGNOSIS — Z01419 Encounter for gynecological examination (general) (routine) without abnormal findings: Secondary | ICD-10-CM | POA: Diagnosis not present

## 2021-08-24 DIAGNOSIS — Z124 Encounter for screening for malignant neoplasm of cervix: Secondary | ICD-10-CM

## 2021-08-24 NOTE — Progress Notes (Signed)
HPI:      [name] is a 70 y.o. G1P1001 who LMP was in the past, she presents today for her annual examination.  The patient has no complaints today. The patient is sexually active. Herlast pap: approximate date 2019 and was normal and last mammogram: approximate date 2022 and was normal.  The patient does perform self breast exams.  There is notable family history of breast or ovarian cancer in her family. The patient is not taking hormone replacement therapy. Patient denies post-menopausal vaginal bleeding.   The patient has regular exercise: yes. The patient denies current symptoms of depression.    GYN Hx: Last Colonoscopy:4 years ago. Normal.  Last DEXA:  9 yrs  ago. 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)   Heart murmur    History of chicken pox    Hyperlipidemia    Hypertension    Insomnia    Past Surgical History:  Procedure Laterality Date   APPENDECTOMY     BREAST EXCISIONAL BIOPSY Right    negative over 5 years ago- neg   BREAST SURGERY     Biopsy   COLONOSCOPY WITH PROPOFOL N/A 05/16/2016   Procedure: COLONOSCOPY WITH PROPOFOL;  Surgeon: [name], MD;  Location: [facility];  Service: Endoscopy;  Laterality: N/A;   CORONARY ARTERY BYPASS GRAFT  05/01/2014   3 vessel, [facility]   ESOPHAGOGASTRODUODENOSCOPY (EGD) WITH PROPOFOL N/A 02/23/2017   Procedure: ESOPHAGOGASTRODUODENOSCOPY (EGD) WITH PROPOFOL;  Surgeon: [name], MD;  Location: [place];  Service: Endoscopy;  Laterality: N/A;   HYSTEROSCOPY WITH D & C     lipoma removed     removed from forehead   triple bypass     Family History  Problem Relation Age of Onset   Breast cancer Other        maternal great aunt   Heart disease Other        multiple family members   Heart disease Mother    Stroke Mother    Alzheimer's disease Mother    Heart disease Father     Stroke Father    Breast cancer Sister 58   Alzheimer's disease Maternal Aunt    Alzheimer's disease Maternal Uncle    Alzheimer's disease Maternal Grandmother    Colon cancer Neg Hx    Diabetes Neg Hx    Ovarian cancer Neg Hx    Social History   Tobacco Use   Smoking status: Never   Smokeless tobacco: Never  Vaping Use   Vaping Use: Never used  Substance Use Topics   Alcohol use: Never    Alcohol/week: 0.0 standard drinks   Drug use: No    Current Outpatient Medications:    Alpha-Lipoic Acid 100 MG CAPS, Take 600 mg by mouth daily., Disp: , Rfl:    amLODipine (NORVASC) 10 MG tablet, Take 10 mg by mouth daily., Disp: , Rfl: 4   ascorbic acid (VITAMIN C) 1000 MG tablet, Take by mouth., Disp: , Rfl:    aspirin EC 81 MG tablet, Take 81 mg by mouth daily., Disp: , Rfl:    calcium-vitamin D (OSCAL WITH D) 500-200 MG-UNIT TABS tablet, Take by mouth., Disp: , Rfl:    carvedilol (COREG) 6.25 MG tablet, Take 6.25 mg by mouth 2 (two) times daily., Disp: , Rfl:  cyanocobalamin 1000 MCG tablet, Take 1,000 mcg by mouth daily., Disp: , Rfl:    Docusate Calcium (STOOL SOFTENER PO), Take by mouth., Disp: , Rfl:    hydrochlorothiazide (HYDRODIURIL) 25 MG tablet, TAKE 1 TABLET (25 MG TOTAL) BY MOUTH ONCE DAILY., Disp: , Rfl: 0   irbesartan (AVAPRO) 300 MG tablet, Take 300 mg by mouth daily., Disp: , Rfl:    Omega-3 Fatty Acids (FISH OIL) 1000 MG CAPS, Take 1,200 mg by mouth daily., Disp: , Rfl:    pantoprazole (PROTONIX) 40 MG tablet, TAKE 1 TABLET (40 MG TOTAL) BY MOUTH DAILY. TAKE 30 MINUTES BEFORE BREAKFAST, Disp: 90 tablet, Rfl: 1   pregabalin (LYRICA) 75 MG capsule, Take 1 capsule (75 mg total) by mouth 2 (two) times daily., Disp: 60 capsule, Rfl: 3   Probiotic Product (PROBIOTIC PO), Take by mouth., Disp: , Rfl:    rOPINIRole (REQUIP) 0.25 MG tablet, Take 0.25 mg by mouth at bedtime., Disp: , Rfl:    spironolactone (ALDACTONE) 25 MG tablet, Take 25 mg by mouth daily., Disp: , Rfl:     traZODone (DESYREL) 50 MG tablet, TAKE 1-2 TABLETS (50-100 MG TOTAL) BY MOUTH AT BEDTIME AS NEEDED., Disp: 180 tablet, Rfl: 1   atorvastatin (LIPITOR) 40 MG tablet, Take by mouth., Disp: , Rfl:  Allergies: Valsartan and Hydralazine  Review of Systems  Constitutional:  Negative for chills, fever and malaise/fatigue.  HENT:  Negative for congestion, sinus pain and sore throat.   Eyes:  Negative for blurred vision and pain.  Respiratory:  Negative for cough and wheezing.   Cardiovascular:  Negative for chest pain and leg swelling.  Gastrointestinal:  Negative for abdominal pain, constipation, diarrhea, heartburn, nausea and vomiting.  Genitourinary:  Negative for dysuria, frequency, hematuria and urgency.  Musculoskeletal:  Negative for back pain, joint pain, myalgias and neck pain.  Skin:  Negative for itching and rash.  Neurological:  Negative for dizziness, tremors and weakness.  Endo/Heme/Allergies:  Does not bruise/bleed easily.  Psychiatric/Behavioral:  Negative for depression. The patient is not nervous/anxious and does not have insomnia.    Objective: BP 130/80    Ht 5' 9.5" (1.765 m)    Wt 172 lb (78 kg)    BMI 25.04 kg/m   Filed Weights   08/24/21 1312  Weight: 172 lb (78 kg)   Body mass index is 25.04 kg/m. Physical Exam Constitutional:      General: She is not in acute distress.    Appearance: She is well-developed.  Genitourinary:     Bladder, rectum and urethral meatus normal.     No lesions in the vagina.     Genitourinary Comments: Gr 1 POP (mild) No sig cystocele     Right Labia: No rash, tenderness or lesions.    Left Labia: No tenderness, lesions or rash.    No vaginal bleeding.     Mild vaginal atrophy present.     Right Adnexa: not tender and no mass present.    Left Adnexa: not tender and no mass present.    No cervical motion tenderness, friability, lesion or polyp.     Uterus is prolapsed.     Uterus is not enlarged.     No uterine mass detected.     Pelvic exam was performed with patient in the lithotomy position.  Breasts:    Right: No mass, skin change or tenderness.     Left: No mass, skin change or tenderness.  HENT:     Head: Normocephalic and  atraumatic. No laceration.     Right Ear: Hearing normal.     Left Ear: Hearing normal.     Mouth/Throat:     Pharynx: Uvula midline.  Eyes:     Pupils: Pupils are equal, round, and reactive to light.  Neck:     Thyroid: No thyromegaly.  Cardiovascular:     Rate and Rhythm: Normal rate and regular rhythm.     Heart sounds: No murmur heard.   No friction rub. No gallop.  Pulmonary:     Effort: Pulmonary effort is normal. No respiratory distress.     Breath sounds: Normal breath sounds. No wheezing.  Abdominal:     General: Bowel sounds are normal. There is no distension.     Palpations: Abdomen is soft.     Tenderness: There is no abdominal tenderness. There is no rebound.  Musculoskeletal:        General: Normal range of motion.     Cervical back: Normal range of motion and neck supple.  Neurological:     Mental Status: She is alert and oriented to person, place, and time.     Cranial Nerves: No cranial nerve deficit.  Skin:    General: Skin is warm and dry.  Psychiatric:        Judgment: Judgment normal.  Vitals reviewed.    Assessment: Annual Exam 1. Women's annual routine gynecological examination   2. Screening for malignant neoplasm of cervix     Plan:            1.  Cervical Screening-  Pap smear done today, Pap smear schedule reviewed with patient  2. Breast screening- Exam annually and mammogram scheduled/done No changes in MMG over the last several yrs  3. Colonoscopy every 10 years, Hemoccult testing after age 42  4. Labs managed by PCP  5. Counseling for hormonal therapy: none              6. FRAX - FRAX score for assessing the 10 year probability for fracture calculated and discussed today.  Based on age and score today, DEXA is not scheduled.  She  does plan for repeat DEXA in 2023 (as ordered by PCP).    F/U  Return in about 1 year (around 08/24/2022) for Annual.  Barnett Applebaum, MD, [facility], [facility] 08/24/2021  1:43 PM

## 2021-08-24 NOTE — Patient Instructions (Signed)
PAP every three years (today) Mammogram every year (Sept)    Call [phone] to schedule at [facility] Colonoscopy every 10 years Labs yearly (with PCP)  Thank you for choosing [facility]. As part of our ongoing efforts to improve patient experience, we would appreciate your feedback. Please fill out the short survey that you will receive by mail or MyChart. Your opinion is important to [place]! - Dr. Kenton Kingfisher  Recommendations to boost your immunity to prevent illness such as viral flu and colds, including covid19, are as follows:       - - -  Vitamin K2 and Vitamin D3  - - - Take Vitamin K2 at 200-300 mcg daily (usually 2-3 pills daily of the over the counter formulation). Take Vitamin D3 at 3000-4000 U daily (usually 3-4 pills daily of the over the counter formulation). Studies show that these two at high normal levels in your system are very effective in keeping your immunity so strong and protective that you will be unlikely to contract viral illness such as those listed above.  Dr Kenton Kingfisher

## 2021-08-26 LAB — CYTOLOGY - PAP
Comment: NEGATIVE
Diagnosis: NEGATIVE
High risk HPV: NEGATIVE

## 2021-09-14 ENCOUNTER — Other Ambulatory Visit: Payer: Self-pay | Admitting: Internal Medicine

## 2021-09-18 ENCOUNTER — Other Ambulatory Visit: Payer: Self-pay | Admitting: Internal Medicine

## 2021-09-20 ENCOUNTER — Ambulatory Visit (INDEPENDENT_AMBULATORY_CARE_PROVIDER_SITE_OTHER): Payer: PPO

## 2021-09-20 VITALS — Ht 69.5 in | Wt 168.0 lb

## 2021-09-20 DIAGNOSIS — Z0000 Encounter for general adult medical examination without abnormal findings: Secondary | ICD-10-CM | POA: Diagnosis not present

## 2021-09-20 NOTE — Progress Notes (Signed)
Subjective:   Alexandra Mendoza is a 71 y.o. female who presents for Medicare Annual (Subsequent) preventive examination.  Review of Systems    No ROS.  Medicare Wellness Virtual Visit.  Visual/audio telehealth visit, UTA vital signs.   See social history for additional risk factors.   Cardiac Risk Factors include: advanced age (>66men, >49 women)     Objective:    Today's Vitals   09/20/21 0829  Weight: 168 lb (76.2 kg)  Height: 5' 9.5" (1.765 m)   Body mass index is 24.45 kg/m.  Advanced Directives 09/20/2021 10/08/2020 06/24/2020 11/26/2019 06/24/2019 05/14/2018 02/23/2017  Does Patient Have a Medical Advance Directive? Yes No Yes No Yes Yes No  Type of Paramedic of [place];Living will - [place];Living will - [place];Living will [place];Living will -  Does patient want to make changes to medical advance directive? No - Patient declined - No - Patient declined - No - Patient declined No - Patient declined -  Copy of [place] in Chart? No - copy requested - No - copy requested - No - copy requested No - copy requested -  Would patient like information on creating a medical advance directive? - - - No - Patient declined - - Yes (MAU/Ambulatory/Procedural Areas - Information given)    Current Medications (verified) Outpatient Encounter Medications as of 09/20/2021  Medication Sig   Alpha-Lipoic Acid 100 MG CAPS Take 600 mg by mouth daily.   amLODipine (NORVASC) 10 MG tablet Take 10 mg by mouth daily.   ascorbic acid (VITAMIN C) 1000 MG tablet Take by mouth.   aspirin EC 81 MG tablet Take 81 mg by mouth daily.   atorvastatin (LIPITOR) 40 MG tablet Take by mouth.   calcium-vitamin D (OSCAL WITH D) 500-200 MG-UNIT TABS tablet Take by mouth.   carvedilol (COREG) 6.25 MG tablet Take 6.25 mg by mouth 2 (two) times daily.   cyanocobalamin 1000 MCG tablet Take 1,000 mcg by mouth  daily.   Docusate Calcium (STOOL SOFTENER PO) Take by mouth.   hydrochlorothiazide (HYDRODIURIL) 25 MG tablet TAKE 1 TABLET (25 MG TOTAL) BY MOUTH ONCE DAILY.   irbesartan (AVAPRO) 300 MG tablet Take 300 mg by mouth daily.   Omega-3 Fatty Acids (FISH OIL) 1000 MG CAPS Take 1,200 mg by mouth daily.   pantoprazole (PROTONIX) 40 MG tablet TAKE 1 TABLET (40 MG TOTAL) BY MOUTH DAILY. TAKE 30 MINUTES BEFORE BREAKFAST   pregabalin (LYRICA) 75 MG capsule Take 1 capsule (75 mg total) by mouth 2 (two) times daily.   Probiotic Product (PROBIOTIC PO) Take by mouth.   rOPINIRole (REQUIP) 0.25 MG tablet Take 0.25 mg by mouth at bedtime.   spironolactone (ALDACTONE) 25 MG tablet Take 25 mg by mouth daily.   traZODone (DESYREL) 50 MG tablet TAKE 1-2 TABLETS (50-100 MG TOTAL) BY MOUTH AT BEDTIME AS NEEDED.   No facility-administered encounter medications on file as of 09/20/2021.    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)   Heart murmur    History of chicken pox    Hyperlipidemia    Hypertension    Insomnia    Past Surgical History:  Procedure Laterality Date   APPENDECTOMY     BREAST EXCISIONAL BIOPSY Right    negative  over 5 years ago- neg   BREAST SURGERY     Biopsy   COLONOSCOPY WITH PROPOFOL N/A 05/16/2016   Procedure: COLONOSCOPY WITH PROPOFOL;  Surgeon: [name], MD;  Location: [facility] ENDOSCOPY;  Service: Endoscopy;  Laterality: N/A;   CORONARY ARTERY BYPASS GRAFT  05/01/2014   3 vessel, [facility]   ESOPHAGOGASTRODUODENOSCOPY (EGD) WITH PROPOFOL N/A 02/23/2017   Procedure: ESOPHAGOGASTRODUODENOSCOPY (EGD) WITH PROPOFOL;  Surgeon: [name], MD;  Location: [place];  Service: Endoscopy;  Laterality: N/A;   HYSTEROSCOPY WITH D & C     lipoma removed     removed from forehead   triple bypass     Family History   Problem Relation Age of Onset   Breast cancer Other        maternal great aunt   Heart disease Other        multiple family members   Heart disease Mother    Stroke Mother    Alzheimer's disease Mother    Heart disease Father    Stroke Father    Breast cancer Sister 69   Alzheimer's disease Maternal Aunt    Alzheimer's disease Maternal Uncle    Alzheimer's disease Maternal Grandmother    Colon cancer Neg Hx    Diabetes Neg Hx    Ovarian cancer Neg Hx    Social History   Socioeconomic History   Marital status: Married    Spouse name: Not on file   Number of children: Not on file   Years of education: Not on file   Highest education level: Not on file  Occupational History   Not on file  Tobacco Use   Smoking status: Never   Smokeless tobacco: Never  Vaping Use   Vaping Use: Never used  Substance and Sexual Activity   Alcohol use: Never    Alcohol/week: 0.0 standard drinks   Drug use: No   Sexual activity: Yes    Birth control/protection: Post-menopausal  Other Topics Concern   Not on file  Social History Narrative   Not on file   Social Determinants of Health   Financial Resource Strain: Low Risk    Difficulty of Paying Living Expenses: Not hard at all  Food Insecurity: No Food Insecurity   Worried About Charity fundraiser in the Last Year: Never true   [place] in the Last Year: Never true  Transportation Needs: No Transportation Needs   Lack of Transportation (Medical): No   Lack of Transportation (Non-Medical): No  Physical Activity: Sufficiently Active   Days of Exercise per Week: 3 days   Minutes of Exercise per Session: 50 min  Stress: No Stress Concern Present   Feeling of Stress : Not at all  Social Connections: Unknown   Frequency of Communication with Friends and Family: More than three times a week   Frequency of Social Gatherings with Friends and Family: More than three times a week   Attends Religious Services: More than 4 times per  year   Active Member of [employer] or Organizations: Yes   Attends Music therapist: More than 4 times per year   Marital Status: Not on file    Tobacco Counseling Counseling given: Not Answered   Clinical Intake:  Pre-visit preparation completed: Yes        Diabetes: No  How often do you need to have someone help you when you read instructions, pamphlets, or other written materials from your doctor or pharmacy?:  1 - Never    Interpreter Needed?: No      Activities of Daily Living In your present state of health, do you have any difficulty performing the following activities: 09/20/2021  Hearing? N  Vision? N  Difficulty concentrating or making decisions? N  Walking or climbing stairs? N  Dressing or bathing? N  Doing errands, shopping? N  Preparing Food and eating ? N  Using the Toilet? N  In the past six months, have you accidently leaked urine? N  Do you have problems with loss of bowel control? N  Managing your Medications? N  Managing your Finances? N  Housekeeping or managing your Housekeeping? N  Some recent data might be hidden    Patient Care Team: Einar Pheasant, MD as PCP - General (Internal Medicine)  Indicate any recent Medical Services you may have received from other than [facility] providers in the past year (date may be approximate).     Assessment:   This is a routine wellness examination for Candela.  Virtual Visit via Telephone Note  I connected with  Jennette Kettle on 09/20/21 at  8:15 AM EST by telephone and verified that I am speaking with the correct person using two identifiers.  Persons participating in the virtual visit: patient/Nurse Health Advisor   I discussed the limitations, risks, security and privacy concerns of performing an evaluation and management service by telephone and the availability of in person appointments. The patient expressed understanding and agreed to proceed.  Interactive audio and video  telecommunications were attempted between this nurse and patient, however failed, due to patient having technical difficulties OR patient did not have access to video capability.  We continued and completed visit with audio only.  Some vital signs may be absent or patient reported.   Hearing/Vision screen Hearing Screening - Comments:: Patient is able to hear conversational tones without difficulty.  No issues reported.  Vision Screening - Comments:: Followed by [facility] Wears reading glasses  Annual exam  They have regular follow up with the ophthalmologist  Dietary issues and exercise activities discussed: Current Exercise Habits: Home exercise routine, Type of exercise: walking, Time (Minutes): 60, Frequency (Times/Week): 3, Weekly Exercise (Minutes/Week): 180, Intensity: Moderate Healthy diet Good water intake   Goals Addressed             This Visit's Progress    Follow up with Primary Care Provider       As needed Maintain weight       Depression Screen PHQ 2/9 Scores 09/20/2021 06/24/2020 06/24/2019 03/10/2019 06/18/2018 05/14/2018 07/17/2017  PHQ - 2 Score 0 0 0 0 1 0 0  PHQ- 9 Score - - - - 3 - -    Fall Risk Fall Risk  09/20/2021 10/11/2020 06/24/2020 04/06/2020 06/24/2019  Falls in the past year? 0 0 0 0 0  Comment - - - Emmi Telephone Survey: data to providers prior to load -  Number falls in past yr: 0 0 0 - -  Injury with Fall? 0 0 - - -  Follow up Falls evaluation completed Falls evaluation completed Falls evaluation completed - -   FALL RISK PREVENTION PERTAINING TO THE HOME: Home free of loose throw rugs in walkways, pet beds, electrical cords, etc? Yes  Adequate lighting in your home to reduce risk of falls? Yes   ASSISTIVE DEVICES UTILIZED TO PREVENT FALLS: Life alert? No  Use of a cane, walker or w/c? No   TIMED UP AND GO: Was  the test performed? No .   Cognitive Function: Patient is alert and oriented x3.  MMSE - Mini Mental State Exam  05/14/2018  Orientation to time 5  Orientation to Place 5  Registration 3  Attention/ Calculation 5  Recall 3  Language- name 2 objects 2  Language- repeat 1  Language- follow 3 step command 3  Language- read & follow direction 1  Write a sentence 1  Copy design 1  Total score 30     6CIT Screen 06/24/2019  What Year? 0 points  What month? 0 points  What time? 0 points  Count back from 20 0 points  Months in reverse 0 points  Repeat phrase 0 points  Total Score 0    Immunizations Immunization History  Administered Date(s) Administered   Fluad Quad(high Dose 65+) 06/24/2019, 07/01/2020, 06/29/2021   Influenza, High Dose Seasonal PF 06/22/2016, 06/25/2017, 06/21/2018   Influenza,inj,Quad PF,6+ Mos 07/02/2014, 06/22/2015   Influenza-Unspecified 07/02/2014   PFIZER(Purple Top)SARS-COV-2 Vaccination 09/29/2019, 10/20/2019, 09/13/2020   Pneumococcal Conjugate-13 08/29/2016   Pneumococcal Polysaccharide-23 07/02/2014, 07/13/2020   Tdap 08/16/2015   Zoster Recombinat (Shingrix) 09/24/2017, 01/01/2018   Zoster, Live 04/06/2012, 09/11/2013   Screening Tests Health Maintenance  Topic Date Due   COVID-19 Vaccine (4 - Booster for Colon series) 10/06/2021 (Originally 11/08/2020)   MAMMOGRAM  05/23/2022   TETANUS/TDAP  08/15/2025   COLONOSCOPY (Pts 45-7yrs Insurance coverage will need to be confirmed)  05/16/2026   Pneumonia Vaccine 64+ Years old  Completed   INFLUENZA VACCINE  Completed   DEXA SCAN  Completed   Hepatitis C Screening  Completed   Zoster Vaccines- Shingrix  Completed   HPV VACCINES  Aged Out   Health Maintenance There are no preventive care reminders to display for this patient.  Lung Cancer Screening: (Low Dose CT Chest recommended if Age 37-80 years, 30 pack-year currently smoking OR have quit w/in 15years.) does not qualify.   Vision Screening: Recommended annual ophthalmology exams for early detection of glaucoma and other disorders of the eye.  Dental  Screening: Recommended annual dental exams for proper oral hygiene  Community Resource Referral / Chronic Care Management: CRR required this visit?  No   CCM required this visit?  No      Plan:   Keep all routine maintenance appointments.   I have personally reviewed and noted the following in the patients chart:   Medical and social history Use of alcohol, tobacco or illicit drugs  Current medications and supplements including opioid prescriptions. Not taking opioid.  Functional ability and status Nutritional status Physical activity Advanced directives List of other physicians Hospitalizations, surgeries, and ER visits in previous 12 months Vitals Screenings to include cognitive, depression, and falls Referrals and appointments  In addition, I have reviewed and discussed with patient certain preventive protocols, quality metrics, and best practice recommendations. A written personalized care plan for preventive services as well as general preventive health recommendations were provided to patient.     Varney Biles, LPN   05/09/5620

## 2021-09-20 NOTE — Patient Instructions (Addendum)
Alexandra Mendoza , Thank you for taking time to come for your Medicare Wellness Visit. I appreciate your ongoing commitment to your health goals. Please review the following plan we discussed and let me know if I can assist you in the future.   These are the goals we discussed:  Goals      Follow up with Primary Care Provider     As needed Maintain weight        This is a list of the screening recommended for you and due dates:  Health Maintenance  Topic Date Due   COVID-19 Vaccine (4 - Booster for Pfizer series) 10/06/2021*   Mammogram  05/23/2022   Tetanus Vaccine  08/15/2025   Colon Cancer Screening  05/16/2026   Pneumonia Vaccine  Completed   Flu Shot  Completed   DEXA scan (bone density measurement)  Completed   Hepatitis C Screening: USPSTF Recommendation to screen - Ages 38-79 yo.  Completed   Zoster (Shingles) Vaccine  Completed   HPV Vaccine  Aged Out  *Topic was postponed. The date shown is not the original due date.    Advanced directives: End of life planning; Advance aging; Advanced directives discussed.  Copy of current HCPOA/Living Will requested.    Conditions/risks identified: none new  Follow up in one year for your annual wellness visit    Preventive Care 65 Years and Older, Female Preventive care refers to lifestyle choices and visits with your health care provider that can promote health and wellness. What does preventive care include? A yearly physical exam. This is also called an annual well check. Dental exams once or twice a year. Routine eye exams. Ask your health care provider how often you should have your eyes checked. Personal lifestyle choices, including: Daily care of your teeth and gums. Regular physical activity. Eating a healthy diet. Avoiding tobacco and drug use. Limiting alcohol use. Practicing safe sex. Taking low-dose aspirin every day. Taking vitamin and mineral supplements as recommended by your health care provider. What happens  during an annual well check? The services and screenings done by your health care provider during your annual well check will depend on your age, overall health, lifestyle risk factors, and family history of disease. Counseling  Your health care provider may ask you questions about your: Alcohol use. Tobacco use. Drug use. Emotional well-being. Home and relationship well-being. Sexual activity. Eating habits. History of falls. Memory and ability to understand (cognition). Work and work Statistician. Reproductive health. Screening  You may have the following tests or measurements: Height, weight, and BMI. Blood pressure. Lipid and cholesterol levels. These may be checked every 5 years, or more frequently if you are over 95 years old. Skin check. Lung cancer screening. You may have this screening every year starting at age 70 if you have a 30-pack-year history of smoking and currently smoke or have quit within the past 15 years. Fecal occult blood test (FOBT) of the stool. You may have this test every year starting at age 27. Flexible sigmoidoscopy or colonoscopy. You may have a sigmoidoscopy every 5 years or a colonoscopy every 10 years starting at age 61. Hepatitis C blood test. Hepatitis B blood test. Sexually transmitted disease (STD) testing. Diabetes screening. This is done by checking your blood sugar (glucose) after you have not eaten for a while (fasting). You may have this done every 1-3 years. Bone density scan. This is done to screen for osteoporosis. You may have this done starting at age 72. Mammogram.  This may be done every 1-2 years. Talk to your health care provider about how often you should have regular mammograms. Talk with your health care provider about your test results, treatment options, and if necessary, the need for more tests. Vaccines  Your health care provider may recommend certain vaccines, such as: Influenza vaccine. This is recommended every  year. Tetanus, diphtheria, and acellular pertussis (Tdap, Td) vaccine. You may need a Td booster every 10 years. Zoster vaccine. You may need this after age 54. Pneumococcal 13-valent conjugate (PCV13) vaccine. One dose is recommended after age 1. Pneumococcal polysaccharide (PPSV23) vaccine. One dose is recommended after age 5. Talk to your health care provider about which screenings and vaccines you need and how often you need them. This information is not intended to replace advice given to you by your health care provider. Make sure you discuss any questions you have with your health care provider. Document Released: 09/24/2015 Document Revised: 05/17/2016 Document Reviewed: 06/29/2015 Elsevier Interactive Patient Education  2017 [place] Prevention in the Home Falls can cause injuries. They can happen to people of all ages. There are many things you can do to make your home safe and to help prevent falls. What can I do on the outside of my home? Regularly fix the edges of walkways and driveways and fix any cracks. Remove anything that might make you trip as you walk through a door, such as a raised step or threshold. Trim any bushes or trees on the path to your home. Use bright outdoor lighting. Clear any walking paths of anything that might make someone trip, such as rocks or tools. Regularly check to see if handrails are loose or broken. Make sure that both sides of any steps have handrails. Any raised decks and porches should have guardrails on the edges. Have any leaves, snow, or ice cleared regularly. Use sand or salt on walking paths during winter. Clean up any spills in your garage right away. This includes oil or grease spills. What can I do in the bathroom? Use night lights. Install grab bars by the toilet and in the tub and shower. Do not use towel bars as grab bars. Use non-skid mats or decals in the tub or shower. If you need to sit down in the shower, use a  plastic, non-slip stool. Keep the floor dry. Clean up any water that spills on the floor as soon as it happens. Remove soap buildup in the tub or shower regularly. Attach bath mats securely with double-sided non-slip rug tape. Do not have throw rugs and other things on the floor that can make you trip. What can I do in the bedroom? Use night lights. Make sure that you have a light by your bed that is easy to reach. Do not use any sheets or blankets that are too big for your bed. They should not hang down onto the floor. Have a firm chair that has side arms. You can use this for support while you get dressed. Do not have throw rugs and other things on the floor that can make you trip. What can I do in the kitchen? Clean up any spills right away. Avoid walking on wet floors. Keep items that you use a lot in easy-to-reach places. If you need to reach something above you, use a strong step stool that has a grab bar. Keep electrical cords out of the way. Do not use floor polish or wax that makes floors slippery. If you  must use wax, use non-skid floor wax. Do not have throw rugs and other things on the floor that can make you trip. What can I do with my stairs? Do not leave any items on the stairs. Make sure that there are handrails on both sides of the stairs and use them. Fix handrails that are broken or loose. Make sure that handrails are as long as the stairways. Check any carpeting to make sure that it is firmly attached to the stairs. Fix any carpet that is loose or worn. Avoid having throw rugs at the top or bottom of the stairs. If you do have throw rugs, attach them to the floor with carpet tape. Make sure that you have a light switch at the top of the stairs and the bottom of the stairs. If you do not have them, ask someone to add them for you. What else can I do to help prevent falls? Wear shoes that: Do not have high heels. Have rubber bottoms. Are comfortable and fit you  well. Are closed at the toe. Do not wear sandals. If you use a stepladder: Make sure that it is fully opened. Do not climb a closed stepladder. Make sure that both sides of the stepladder are locked into place. Ask someone to hold it for you, if possible. Clearly mark and make sure that you can see: Any grab bars or handrails. First and last steps. Where the edge of each step is. Use tools that help you move around (mobility aids) if they are needed. These include: Canes. Walkers. Scooters. Crutches. Turn on the lights when you go into a dark area. Replace any light bulbs as soon as they burn out. Set up your furniture so you have a clear path. Avoid moving your furniture around. If any of your floors are uneven, fix them. If there are any pets around you, be aware of where they are. Review your medicines with your doctor. Some medicines can make you feel dizzy. This can increase your chance of falling. Ask your doctor what other things that you can do to help prevent falls. This information is not intended to replace advice given to you by your health care provider. Make sure you discuss any questions you have with your health care provider. Document Released: 06/24/2009 Document Revised: 02/03/2016 Document Reviewed: 10/02/2014 Elsevier Interactive Patient Education  2017 [employer].

## 2021-11-01 ENCOUNTER — Ambulatory Visit (INDEPENDENT_AMBULATORY_CARE_PROVIDER_SITE_OTHER): Payer: PPO | Admitting: Internal Medicine

## 2021-11-01 ENCOUNTER — Encounter: Payer: Self-pay | Admitting: Internal Medicine

## 2021-11-01 ENCOUNTER — Other Ambulatory Visit: Payer: Self-pay

## 2021-11-01 DIAGNOSIS — M8580 Other specified disorders of bone density and structure, unspecified site: Secondary | ICD-10-CM | POA: Diagnosis not present

## 2021-11-01 DIAGNOSIS — I700 Atherosclerosis of aorta: Secondary | ICD-10-CM | POA: Insufficient documentation

## 2021-11-01 DIAGNOSIS — D696 Thrombocytopenia, unspecified: Secondary | ICD-10-CM

## 2021-11-01 DIAGNOSIS — F419 Anxiety disorder, unspecified: Secondary | ICD-10-CM | POA: Diagnosis not present

## 2021-11-01 DIAGNOSIS — I2510 Atherosclerotic heart disease of native coronary artery without angina pectoris: Secondary | ICD-10-CM | POA: Diagnosis not present

## 2021-11-01 DIAGNOSIS — I10 Essential (primary) hypertension: Secondary | ICD-10-CM | POA: Diagnosis not present

## 2021-11-01 DIAGNOSIS — G629 Polyneuropathy, unspecified: Secondary | ICD-10-CM | POA: Diagnosis not present

## 2021-11-01 DIAGNOSIS — E7800 Pure hypercholesterolemia, unspecified: Secondary | ICD-10-CM | POA: Diagnosis not present

## 2021-11-01 DIAGNOSIS — D3502 Benign neoplasm of left adrenal gland: Secondary | ICD-10-CM | POA: Diagnosis not present

## 2021-11-01 DIAGNOSIS — K2100 Gastro-esophageal reflux disease with esophagitis, without bleeding: Secondary | ICD-10-CM

## 2021-11-01 NOTE — Progress Notes (Signed)
[name], female   DOB: 1951/06/08, 71 y.o.   MRN: [id number]   Subjective:    [name], female    DOB: 27-Dec-1950, 71 y.o.   MRN: [id number]  This visit occurred during the SARS-CoV-2 public health emergency.  Safety protocols were in place, including screening questions prior to the visit, additional usage of staff PPE, and extensive cleaning of exam room while observing appropriate contact time as indicated for disinfecting solutions.   Patient here for a scheduled follow up.   Chief Complaint  Patient presents with   Hypertension   Hyperlipidemia   .   Hypertension Pertinent negatives include no chest pain, headaches, palpitations or shortness of breath.  Hyperlipidemia Pertinent negatives include no chest pain, myalgias or shortness of breath.  Doing relatively well.  Does describe a previous sinking feeling - after taking her am medications.  She started splitting her aldactone and taking 1/2 with am meds and 1/2 midday.  No further sinking feeling.  Discussed adjusting timing of medication.  No chest pain or sob reported.  No abdominal pain or bowel change reported.  Taking probiotics qod.  This keeps her bowels regular.  Agreeable for bone density.  Saw gyn 08/24/21 - PAP - ok. - negative with atrophy - negative HPV.    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)   Heart murmur    History of chicken pox    Hyperlipidemia    Hypertension    Insomnia    Past Surgical History:  Procedure Laterality Date   APPENDECTOMY     BREAST EXCISIONAL BIOPSY Right    negative over 5 years ago- neg   BREAST SURGERY     Biopsy   COLONOSCOPY WITH PROPOFOL N/A 05/16/2016   Procedure: COLONOSCOPY WITH PROPOFOL;  Surgeon: [name], MD;  Location: [facility];  Service: Endoscopy;  Laterality: N/A;   CORONARY ARTERY BYPASS GRAFT  05/01/2014   3 vessel, [facility]   ESOPHAGOGASTRODUODENOSCOPY (EGD) WITH PROPOFOL N/A 02/23/2017   Procedure: ESOPHAGOGASTRODUODENOSCOPY (EGD) WITH PROPOFOL;  Surgeon: [name], MD;  Location: [facility];  Service: Endoscopy;  Laterality: N/A;   HYSTEROSCOPY WITH D & C     lipoma removed     removed from forehead   triple bypass     Family History  Problem Relation Age of Onset   Breast cancer Other        maternal great aunt   Heart disease Other        multiple family members   Heart disease Mother    Stroke Mother    Alzheimer's disease Mother    Heart disease Father    Stroke Father    Breast cancer Sister 25   Alzheimer's disease Maternal Aunt    Alzheimer's disease Maternal Uncle    Alzheimer's disease Maternal Grandmother    Colon cancer Neg Hx    Diabetes Neg Hx    Ovarian cancer Neg Hx    Social History   Socioeconomic History   Marital status: Married    Spouse name: Not on file   Number of children: Not on file   Years of education: Not on file   Highest education level: Not on file  Occupational History   Not on file  Tobacco Use   Smoking status: Never   Smokeless tobacco:  Never  Vaping Use   Vaping Use: Never used  Substance and Sexual Activity   Alcohol use: Never    Alcohol/week: 0.0 standard drinks   Drug use: No   Sexual activity: Yes    Birth control/protection: Post-menopausal  Other Topics Concern   Not on file  Social History Narrative   Not on file   Social Determinants of Health   Financial Resource Strain: Low Risk    Difficulty of Paying Living Expenses: Not hard at all  Food Insecurity: No Food Insecurity   Worried About Charity fundraiser in the Last Year: Never true   [place] in the Last Year: Never true  Transportation Needs: No Transportation Needs   Lack of Transportation (Medical): No   Lack of Transportation (Non-Medical): No  Physical Activity: Sufficiently Active   Days of Exercise per Week: 3 days   Minutes of Exercise  per Session: 50 min  Stress: No Stress Concern Present   Feeling of Stress : Not at all  Social Connections: Unknown   Frequency of Communication with Friends and Family: More than three times a week   Frequency of Social Gatherings with Friends and Family: More than three times a week   Attends Religious Services: More than 4 times per year   Active Member of [employer] or Organizations: Yes   Attends Music therapist: More than 4 times per year   Marital Status: Not on file     Review of Systems  Constitutional:  Negative for appetite change and unexpected weight change.  HENT:  Negative for congestion and sinus pressure.   Respiratory:  Negative for cough, chest tightness and shortness of breath.   Cardiovascular:  Negative for chest pain, palpitations and leg swelling.  Gastrointestinal:  Negative for abdominal pain, diarrhea, nausea and vomiting.  Genitourinary:  Negative for difficulty urinating and dysuria.  Musculoskeletal:  Negative for joint swelling and myalgias.  Skin:  Negative for color change and rash.  Neurological:  Negative for dizziness, light-headedness and headaches.  Psychiatric/Behavioral:  Negative for agitation and dysphoric mood.       Objective:     BP 122/68    Pulse 77    Temp 97.8 F (36.6 C)    Resp 16    Ht 5\' 9"  (1.753 m)    Wt 171 lb 9.6 oz (77.8 kg)    SpO2 98%    BMI 25.34 kg/m  Wt Readings from Last 3 Encounters:  11/01/21 171 lb 9.6 oz (77.8 kg)  09/20/21 168 lb (76.2 kg)  08/24/21 172 lb (78 kg)    Physical Exam Vitals reviewed.  Constitutional:      General: She is not in acute distress.    Appearance: Normal appearance.  HENT:     Head: Normocephalic and atraumatic.     Right Ear: External ear normal.     Left Ear: External ear normal.  Eyes:     General: No scleral icterus.       Right eye: No discharge.        Left eye: No discharge.     Conjunctiva/sclera: Conjunctivae normal.  Neck:     Thyroid: No thyromegaly.   Cardiovascular:     Rate and Rhythm: Normal rate and regular rhythm.  Pulmonary:     Effort: No respiratory distress.     Breath sounds: Normal breath sounds. No wheezing.  Abdominal:     General: Bowel sounds are normal.  Palpations: Abdomen is soft.     Tenderness: There is no abdominal tenderness.  Musculoskeletal:        General: No swelling or tenderness.     Cervical back: Neck supple. No tenderness.  Lymphadenopathy:     Cervical: No cervical adenopathy.  Skin:    Findings: No erythema or rash.  Neurological:     Mental Status: She is alert.  Psychiatric:        Mood and Affect: Mood normal.        Behavior: Behavior normal.     Outpatient Encounter Medications as of 11/01/2021  Medication Sig   Alpha-Lipoic Acid 100 MG CAPS Take 600 mg by mouth daily.   amLODipine (NORVASC) 10 MG tablet Take 10 mg by mouth daily.   ascorbic acid (VITAMIN C) 1000 MG tablet Take by mouth.   aspirin EC 81 MG tablet Take 81 mg by mouth daily.   atorvastatin (LIPITOR) 40 MG tablet Take by mouth.   calcium-vitamin D (OSCAL WITH D) 500-200 MG-UNIT TABS tablet Take by mouth.   carvedilol (COREG) 6.25 MG tablet Take 6.25 mg by mouth 2 (two) times daily.   cyanocobalamin 1000 MCG tablet Take 1,000 mcg by mouth daily.   Docusate Calcium (STOOL SOFTENER PO) Take by mouth.   hydrochlorothiazide (HYDRODIURIL) 25 MG tablet TAKE 1 TABLET (25 MG TOTAL) BY MOUTH ONCE DAILY.   irbesartan (AVAPRO) 300 MG tablet Take 300 mg by mouth daily.   Omega-3 Fatty Acids (FISH OIL) 1000 MG CAPS Take 1,200 mg by mouth daily.   pantoprazole (PROTONIX) 40 MG tablet TAKE 1 TABLET (40 MG TOTAL) BY MOUTH DAILY. TAKE 30 MINUTES BEFORE BREAKFAST   pregabalin (LYRICA) 75 MG capsule Take 1 capsule (75 mg total) by mouth 2 (two) times daily.   Probiotic Product (PROBIOTIC PO) Take by mouth.   rOPINIRole (REQUIP) 0.25 MG tablet Take 0.25 mg by mouth at bedtime.   spironolactone (ALDACTONE) 25 MG tablet Take 25 mg by mouth  daily.   traZODone (DESYREL) 50 MG tablet TAKE 1-2 TABLETS (50-100 MG TOTAL) BY MOUTH AT BEDTIME AS NEEDED.   No facility-administered encounter medications on file as of 11/01/2021.     Lab Results  Component Value Date   WBC 5.0 05/02/2021   HGB 13.0 05/02/2021   HCT 38.7 05/02/2021   PLT 152.0 05/02/2021   GLUCOSE 75 05/02/2021   CHOL 100 05/02/2021   TRIG 65.0 05/02/2021   HDL 36.00 (L) 05/02/2021   LDLCALC 51 05/02/2021   ALT 16 05/02/2021   AST 16 05/02/2021   NA 139 05/02/2021   K 4.0 05/02/2021   CL 103 05/02/2021   CREATININE 0.87 05/02/2021   BUN 14 05/02/2021   CO2 30 05/02/2021   TSH 3.10 05/02/2021       Assessment & Plan:   Problem List Items Addressed This Visit     Adrenal benign neoplasm    Has been evaluated by urology previously.  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  No further w/up warranted.        Anxiety    Continues on trazodone at night.  Overall appears to be handling things well.  Follow.       Aortic atherosclerosis (HCC)    Continue lipitor.       CAD (coronary artery disease)    Sees Dr Nehemiah Massed.  Continue risk factor modification.  Stable.       Esophagitis, reflux    No upper symptoms reported.  On protonix.        Essential hypertension    Currently on coreg, amlodipine, hctz, avapro and aldactone.  Previous abdominal ultrasound - kidneys relatively equal size, etc. Saw nephrology.  No changes made.  Pressure better.  Follow.        Hypercholesterolemia    Continue lipitor.  Low cholesterol diet and exercise.  Follow lipid panel and liver function tests.       Neuropathy    NCS revealed polyneuropathy.  Continue lyrica.  She plans to take one bid.   Follow.       Osteopenia    Started calcium.  Schedule bone density.  Weight bearing exercise.        Thrombocytopenia ([place])    Follow cbc        Einar Pheasant, MD

## 2021-11-01 NOTE — Assessment & Plan Note (Signed)
Continues on trazodone at night.  Overall appears to be handling things well.  Follow.

## 2021-11-01 NOTE — Assessment & Plan Note (Signed)
Currently on coreg, amlodipine, hctz, avapro and aldactone.  Previous abdominal ultrasound - kidneys relatively equal size, etc. Saw nephrology.  No changes made.  Pressure better.  Follow.   

## 2021-11-01 NOTE — Assessment & Plan Note (Signed)
Sees Dr Kowalski.  Continue risk factor modification.  Stable.  

## 2021-11-01 NOTE — Assessment & Plan Note (Signed)
NCS revealed polyneuropathy.  Continue lyrica.  She plans to take one bid.   Follow.

## 2021-11-01 NOTE — Assessment & Plan Note (Signed)
Continue lipitor.  Low cholesterol diet and exercise.  Follow lipid panel and liver function tests.   

## 2021-11-01 NOTE — Assessment & Plan Note (Signed)
Has been evaluated by urology previously.  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  No further w/up warranted.   

## 2021-11-01 NOTE — Assessment & Plan Note (Signed)
Continue lipitor  ?

## 2021-11-01 NOTE — Assessment & Plan Note (Signed)
Started calcium.  Schedule bone density.  Weight bearing exercise.

## 2021-11-01 NOTE — Assessment & Plan Note (Signed)
No upper symptoms reported.  On protonix.   

## 2021-11-01 NOTE — Assessment & Plan Note (Signed)
Follow cbc.  

## 2021-11-17 ENCOUNTER — Other Ambulatory Visit: Payer: Self-pay | Admitting: Internal Medicine

## 2021-11-21 ENCOUNTER — Other Ambulatory Visit (INDEPENDENT_AMBULATORY_CARE_PROVIDER_SITE_OTHER): Payer: PPO

## 2021-11-21 ENCOUNTER — Other Ambulatory Visit: Payer: Self-pay

## 2021-11-21 DIAGNOSIS — I10 Essential (primary) hypertension: Secondary | ICD-10-CM

## 2021-11-21 DIAGNOSIS — Z114 Encounter for screening for human immunodeficiency virus [HIV]: Secondary | ICD-10-CM

## 2021-11-21 DIAGNOSIS — Z1159 Encounter for screening for other viral diseases: Secondary | ICD-10-CM

## 2021-11-21 DIAGNOSIS — E7800 Pure hypercholesterolemia, unspecified: Secondary | ICD-10-CM

## 2021-11-21 DIAGNOSIS — D696 Thrombocytopenia, unspecified: Secondary | ICD-10-CM | POA: Diagnosis not present

## 2021-11-21 LAB — CBC WITH DIFFERENTIAL/PLATELET
Basophils Absolute: 0.1 10*3/uL (ref 0.0–0.1)
Basophils Relative: 1.7 % (ref 0.0–3.0)
Eosinophils Absolute: 0.2 10*3/uL (ref 0.0–0.7)
Eosinophils Relative: 3 % (ref 0.0–5.0)
HCT: 38.3 % (ref 36.0–46.0)
Hemoglobin: 12.9 g/dL (ref 12.0–15.0)
Lymphocytes Relative: 35.5 % (ref 12.0–46.0)
Lymphs Abs: 2.2 10*3/uL (ref 0.7–4.0)
MCHC: 33.8 g/dL (ref 30.0–36.0)
MCV: 92.6 fl (ref 78.0–100.0)
Monocytes Absolute: 0.7 10*3/uL (ref 0.1–1.0)
Monocytes Relative: 10.7 % (ref 3.0–12.0)
Neutro Abs: 3 10*3/uL (ref 1.4–7.7)
Neutrophils Relative %: 49.1 % (ref 43.0–77.0)
Platelets: 186 10*3/uL (ref 150.0–400.0)
RBC: 4.14 Mil/uL (ref 3.87–5.11)
RDW: 13 % (ref 11.5–15.5)
WBC: 6.1 10*3/uL (ref 4.0–10.5)

## 2021-11-21 LAB — HEPATIC FUNCTION PANEL
ALT: 15 U/L (ref 0–35)
AST: 16 U/L (ref 0–37)
Albumin: 4.7 g/dL (ref 3.5–5.2)
Alkaline Phosphatase: 35 U/L — ABNORMAL LOW (ref 39–117)
Bilirubin, Direct: 0.1 mg/dL (ref 0.0–0.3)
Total Bilirubin: 0.6 mg/dL (ref 0.2–1.2)
Total Protein: 6.6 g/dL (ref 6.0–8.3)

## 2021-11-21 LAB — BASIC METABOLIC PANEL
BUN: 18 mg/dL (ref 6–23)
CO2: 30 mEq/L (ref 19–32)
Calcium: 10.6 mg/dL — ABNORMAL HIGH (ref 8.4–10.5)
Chloride: 99 mEq/L (ref 96–112)
Creatinine, Ser: 0.95 mg/dL (ref 0.40–1.20)
GFR: 60.71 mL/min (ref >60.00)
Glucose, Bld: 91 mg/dL (ref 70–99)
Potassium: 4.2 mEq/L (ref 3.5–5.1)
Sodium: 136 mEq/L (ref 135–145)

## 2021-11-21 LAB — LIPID PANEL
Cholesterol: 106 mg/dL (ref 0–200)
HDL: 36 mg/dL — ABNORMAL LOW (ref >39.00)
LDL Cholesterol: 49 mg/dL (ref 0–99)
NonHDL: 69.86
Total CHOL/HDL Ratio: 3
Triglycerides: 104 mg/dL (ref 0.0–149.0)
VLDL: 20.8 mg/dL (ref 0.0–40.0)

## 2021-11-22 ENCOUNTER — Other Ambulatory Visit: Payer: Self-pay

## 2021-11-22 ENCOUNTER — Encounter: Payer: Self-pay | Admitting: Internal Medicine

## 2021-11-22 LAB — HEPATITIS C ANTIBODY
Hepatitis C Ab: NONREACTIVE
SIGNAL TO CUT-OFF: <0.02 (ref <1.00)

## 2021-11-22 LAB — HIV ANTIBODY (ROUTINE TESTING W REFLEX): HIV 1&2 Ab, 4th Generation: NONREACTIVE

## 2021-11-24 DIAGNOSIS — G608 Other hereditary and idiopathic neuropathies: Secondary | ICD-10-CM | POA: Diagnosis not present

## 2021-11-24 DIAGNOSIS — G44099 Other trigeminal autonomic cephalgias (TAC), not intractable: Secondary | ICD-10-CM | POA: Diagnosis not present

## 2021-11-24 DIAGNOSIS — G2581 Restless legs syndrome: Secondary | ICD-10-CM | POA: Diagnosis not present

## 2021-12-01 DIAGNOSIS — I25810 Atherosclerosis of coronary artery bypass graft(s) without angina pectoris: Secondary | ICD-10-CM | POA: Diagnosis not present

## 2021-12-01 DIAGNOSIS — I6523 Occlusion and stenosis of bilateral carotid arteries: Secondary | ICD-10-CM | POA: Diagnosis not present

## 2021-12-01 DIAGNOSIS — E782 Mixed hyperlipidemia: Secondary | ICD-10-CM | POA: Diagnosis not present

## 2021-12-01 DIAGNOSIS — I10 Essential (primary) hypertension: Secondary | ICD-10-CM | POA: Diagnosis not present

## 2021-12-01 DIAGNOSIS — I25708 Atherosclerosis of coronary artery bypass graft(s), unspecified, with other forms of angina pectoris: Secondary | ICD-10-CM | POA: Diagnosis not present

## 2021-12-13 ENCOUNTER — Other Ambulatory Visit (INDEPENDENT_AMBULATORY_CARE_PROVIDER_SITE_OTHER): Payer: PPO

## 2021-12-13 LAB — BASIC METABOLIC PANEL
BUN: 24 mg/dL — ABNORMAL HIGH (ref 6–23)
CO2: 29 mEq/L (ref 19–32)
Calcium: 10.6 mg/dL — ABNORMAL HIGH (ref 8.4–10.5)
Chloride: 100 mEq/L (ref 96–112)
Creatinine, Ser: 1.01 mg/dL (ref 0.40–1.20)
GFR: 56.38 mL/min — ABNORMAL LOW (ref >60.00)
Glucose, Bld: 86 mg/dL (ref 70–99)
Potassium: 4.2 mEq/L (ref 3.5–5.1)
Sodium: 136 mEq/L (ref 135–145)

## 2021-12-14 ENCOUNTER — Other Ambulatory Visit: Payer: Self-pay | Admitting: Internal Medicine

## 2021-12-14 ENCOUNTER — Other Ambulatory Visit: Payer: Self-pay

## 2021-12-14 DIAGNOSIS — R944 Abnormal results of kidney function studies: Secondary | ICD-10-CM

## 2021-12-14 NOTE — Progress Notes (Signed)
Order placed for f/u labs.  

## 2021-12-15 ENCOUNTER — Ambulatory Visit
Admission: RE | Admit: 2021-12-15 | Discharge: 2021-12-15 | Disposition: A | Discharge status: Home / Self Care | Payer: PPO | Source: Ambulatory Visit | Attending: Internal Medicine | Admitting: Internal Medicine

## 2021-12-15 DIAGNOSIS — Z780 Asymptomatic menopausal state: Secondary | ICD-10-CM | POA: Diagnosis not present

## 2021-12-15 DIAGNOSIS — E2839 Other primary ovarian failure: Secondary | ICD-10-CM | POA: Insufficient documentation

## 2021-12-15 DIAGNOSIS — M85852 Other specified disorders of bone density and structure, left thigh: Secondary | ICD-10-CM | POA: Diagnosis not present

## 2022-01-04 ENCOUNTER — Other Ambulatory Visit (INDEPENDENT_AMBULATORY_CARE_PROVIDER_SITE_OTHER): Payer: PPO

## 2022-01-04 LAB — BASIC METABOLIC PANEL
BUN: 16 mg/dL (ref 6–23)
CO2: 28 mEq/L (ref 19–32)
Calcium: 9.8 mg/dL (ref 8.4–10.5)
Chloride: 101 mEq/L (ref 96–112)
Creatinine, Ser: 0.91 mg/dL (ref 0.40–1.20)
GFR: 63.87 mL/min (ref >60.00)
Glucose, Bld: 82 mg/dL (ref 70–99)
Potassium: 3.9 mEq/L (ref 3.5–5.1)
Sodium: 136 mEq/L (ref 135–145)

## 2022-01-05 ENCOUNTER — Other Ambulatory Visit: Payer: Self-pay

## 2022-01-05 ENCOUNTER — Encounter: Payer: Self-pay | Admitting: Internal Medicine

## 2022-01-05 MED ORDER — HYDROCHLOROTHIAZIDE 12.5 MG PO CAPS: 12.5000 mg | ORAL_CAPSULE | Freq: Every day | ORAL | 0 refills | Status: DC

## 2022-01-05 NOTE — Telephone Encounter (Signed)
Please send in hctz 12.'5mg'$  q day.  Notify pt I am changing her to the 12.'5mg'$  capsule to take one per day.   ?

## 2022-01-20 ENCOUNTER — Other Ambulatory Visit: Payer: Self-pay | Admitting: Internal Medicine

## 2022-02-06 ENCOUNTER — Other Ambulatory Visit: Payer: Self-pay | Admitting: Internal Medicine

## 2022-02-17 ENCOUNTER — Other Ambulatory Visit: Payer: Self-pay | Admitting: Internal Medicine

## 2022-02-17 ENCOUNTER — Encounter: Payer: Self-pay | Admitting: Internal Medicine

## 2022-02-17 NOTE — Telephone Encounter (Signed)
Refill sent to PCP for signature

## 2022-02-18 NOTE — Telephone Encounter (Signed)
Rx ok'd for lyrica #60 with 2 refills.  

## 2022-03-02 ENCOUNTER — Encounter: Payer: Self-pay | Admitting: Internal Medicine

## 2022-03-02 ENCOUNTER — Ambulatory Visit (INDEPENDENT_AMBULATORY_CARE_PROVIDER_SITE_OTHER): Payer: PPO | Admitting: Internal Medicine

## 2022-03-02 VITALS — BP 130/70 | HR 63 | Temp 98.1°F | Resp 16 | Ht 69.0 in | Wt 171.4 lb

## 2022-03-02 DIAGNOSIS — I10 Essential (primary) hypertension: Secondary | ICD-10-CM | POA: Diagnosis not present

## 2022-03-02 DIAGNOSIS — F419 Anxiety disorder, unspecified: Secondary | ICD-10-CM | POA: Diagnosis not present

## 2022-03-02 DIAGNOSIS — E7800 Pure hypercholesterolemia, unspecified: Secondary | ICD-10-CM

## 2022-03-02 DIAGNOSIS — G629 Polyneuropathy, unspecified: Secondary | ICD-10-CM

## 2022-03-02 DIAGNOSIS — D696 Thrombocytopenia, unspecified: Secondary | ICD-10-CM

## 2022-03-02 DIAGNOSIS — K2100 Gastro-esophageal reflux disease with esophagitis, without bleeding: Secondary | ICD-10-CM

## 2022-03-02 DIAGNOSIS — I700 Atherosclerosis of aorta: Secondary | ICD-10-CM | POA: Diagnosis not present

## 2022-03-02 DIAGNOSIS — D3502 Benign neoplasm of left adrenal gland: Secondary | ICD-10-CM | POA: Diagnosis not present

## 2022-03-02 DIAGNOSIS — I2510 Atherosclerotic heart disease of native coronary artery without angina pectoris: Secondary | ICD-10-CM

## 2022-03-02 NOTE — Progress Notes (Signed)
[name], female   DOB: Jan 03, 1951, 71 y.o.   MRN: [id number]   Subjective:    [name], female    DOB: 1951-02-14, 71 y.o.   MRN: [id number]   Patient here for a scheduled follow up.   Chief Complaint  Patient presents with   Hypertension   .   HPI Reports she is doing relatively well.  No chest pain or sob reported.  Saw Dr Nehemiah Massed 12/01/21.  Discussed timing - taking medication - to help with dizziness.  No headache.  No cough or congestion.  No abdominal pain or bowel change reported.  Blood pressures doing better.  Handling stress.     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)   Heart murmur    History of chicken pox    Hyperlipidemia    Hypertension    Insomnia    Past Surgical History:  Procedure Laterality Date   APPENDECTOMY     BREAST EXCISIONAL BIOPSY Right    negative over 5 years ago- neg   BREAST SURGERY     Biopsy   COLONOSCOPY WITH PROPOFOL N/A 05/16/2016   Procedure: COLONOSCOPY WITH PROPOFOL;  Surgeon: [name], MD;  Location: [facility];  Service: Endoscopy;  Laterality: N/A;   CORONARY ARTERY BYPASS GRAFT  05/01/2014   3 vessel, [facility]   ESOPHAGOGASTRODUODENOSCOPY (EGD) WITH PROPOFOL N/A 02/23/2017   Procedure: ESOPHAGOGASTRODUODENOSCOPY (EGD) WITH PROPOFOL;  Surgeon: [name], MD;  Location: [facility];  Service: Endoscopy;  Laterality: N/A;   HYSTEROSCOPY WITH D & C     lipoma removed     removed from forehead   triple bypass     Family History  Problem Relation Age of Onset   Breast cancer Other        maternal great aunt   Heart disease Other        multiple family members   Heart disease Mother    Stroke Mother    Alzheimer's disease Mother    Heart disease Father    Stroke Father    Breast cancer Sister 74   Alzheimer's disease Maternal Aunt    Alzheimer's disease Maternal Uncle     Alzheimer's disease Maternal Grandmother    Colon cancer Neg Hx    Diabetes Neg Hx    Ovarian cancer Neg Hx    Social History   Socioeconomic History   Marital status: Married    Spouse name: Not on file   Number of children: Not on file   Years of education: Not on file   Highest education level: Not on file  Occupational History   Not on file  Tobacco Use   Smoking status: Never   Smokeless tobacco: Never  Vaping Use   Vaping Use: Never used  Substance and Sexual Activity   Alcohol use: Never    Alcohol/week: 0.0 standard drinks of alcohol   Drug use: No   Sexual activity: Yes    Birth control/protection: Post-menopausal  Other Topics Concern   Not on file  Social History Narrative   Not on file   Social Determinants of Health   Financial Resource Strain: Low Risk  (09/20/2021)   Overall Financial Resource Strain (CARDIA)    Difficulty of Paying Living Expenses: Not hard at all  Food Insecurity: No Food Insecurity (09/20/2021)  Hunger Vital Sign    Worried About Running Out of Food in the Last Year: Never true    Ran Out of Food in the Last Year: Never true  Transportation Needs: No Transportation Needs (09/20/2021)   PRAPARE - Hydrologist (Medical): No    Lack of Transportation (Non-Medical): No  Physical Activity: Sufficiently Active (09/20/2021)   Exercise Vital Sign    Days of Exercise per Week: 3 days    Minutes of Exercise per Session: 50 min  Stress: No Stress Concern Present (09/20/2021)   Reading    Feeling of Stress : Not at all  Social Connections: Unknown (09/20/2021)   Social Connection and Isolation Panel [NHANES]    Frequency of Communication with Friends and Family: More than three times a week    Frequency of Social Gatherings with Friends and Family: More than three times a week    Attends Religious Services: More than 4 times per year    Active  Member of [employer] or Organizations: Yes    Attends Music therapist: More than 4 times per year    Marital Status: Not on file     Review of Systems  Constitutional:  Negative for appetite change and unexpected weight change.  HENT:  Negative for sinus pressure.   Respiratory:  Negative for cough, chest tightness and shortness of breath.   Cardiovascular:  Negative for chest pain, palpitations and leg swelling.  Gastrointestinal:  Negative for abdominal pain, diarrhea, nausea and vomiting.  Genitourinary:  Negative for difficulty urinating and dysuria.  Musculoskeletal:  Negative for joint swelling and myalgias.  Skin:  Negative for color change and rash.  Neurological:  Negative for dizziness and headaches.  Psychiatric/Behavioral:  Negative for agitation and dysphoric mood.        Objective:     BP 130/70 (BP Location: Left Arm, Patient Position: Sitting, Cuff Size: Small)   Pulse 63   Temp 98.1 F (36.7 C) (Temporal)   Resp 16   Ht '5\' 9"'$  (1.753 m)   Wt 171 lb 6.4 oz (77.7 kg)   SpO2 99%   BMI 25.31 kg/m  Wt Readings from Last 3 Encounters:  03/02/22 171 lb 6.4 oz (77.7 kg)  11/01/21 171 lb 9.6 oz (77.8 kg)  09/20/21 168 lb (76.2 kg)    Physical Exam Vitals reviewed.  Constitutional:      General: She is not in acute distress.    Appearance: Normal appearance.  HENT:     Head: Normocephalic and atraumatic.     Right Ear: External ear normal.     Left Ear: External ear normal.  Eyes:     General: No scleral icterus.       Right eye: No discharge.        Left eye: No discharge.     Conjunctiva/sclera: Conjunctivae normal.  Neck:     Thyroid: No thyromegaly.  Cardiovascular:     Rate and Rhythm: Normal rate and regular rhythm.  Pulmonary:     Effort: No respiratory distress.     Breath sounds: Normal breath sounds. No wheezing.  Abdominal:     General: Bowel sounds are normal.     Palpations: Abdomen is soft.     Tenderness: There is no  abdominal tenderness.  Musculoskeletal:        General: No swelling or tenderness.     Cervical back: Neck supple. No tenderness.  Lymphadenopathy:     Cervical: No cervical adenopathy.  Skin:    Findings: No erythema or rash.  Neurological:     Mental Status: She is alert.  Psychiatric:        Mood and Affect: Mood normal.        Behavior: Behavior normal.      Outpatient Encounter Medications as of 03/02/2022  Medication Sig   Alpha-Lipoic Acid 100 MG CAPS Take 600 mg by mouth daily.   amLODipine (NORVASC) 10 MG tablet Take 10 mg by mouth daily.   ascorbic acid (VITAMIN C) 1000 MG tablet Take by mouth.   aspirin EC 81 MG tablet Take 81 mg by mouth daily.   atorvastatin (LIPITOR) 40 MG tablet Take by mouth.   calcium-vitamin D (OSCAL WITH D) 500-200 MG-UNIT TABS tablet Take by mouth.   carvedilol (COREG) 6.25 MG tablet Take 6.25 mg by mouth 2 (two) times daily.   cyanocobalamin 1000 MCG tablet Take 1,000 mcg by mouth daily.   Docusate Calcium (STOOL SOFTENER PO) Take by mouth.   hydrochlorothiazide (MICROZIDE) 12.5 MG capsule Take 1 capsule (12.5 mg total) by mouth daily.   irbesartan (AVAPRO) 300 MG tablet TAKE 1 TABLET BY MOUTH EVERY DAY   Omega-3 Fatty Acids (FISH OIL) 1000 MG CAPS Take 1,200 mg by mouth daily.   pantoprazole (PROTONIX) 40 MG tablet TAKE 1 TABLET (40 MG TOTAL) BY MOUTH DAILY TAKE 30 MINUTES BEFORE BREAKFAST   pregabalin (LYRICA) 75 MG capsule Take 1 capsule by mouth twice daily   Probiotic Product (PROBIOTIC PO) Take by mouth.   spironolactone (ALDACTONE) 25 MG tablet Take 25 mg by mouth daily.   traZODone (DESYREL) 50 MG tablet TAKE 1-2 TABLETS (50-100 MG TOTAL) BY MOUTH AT BEDTIME AS NEEDED.   [DISCONTINUED] rOPINIRole (REQUIP) 0.25 MG tablet Take 0.25 mg by mouth at bedtime. (Patient not taking: Reported on 03/02/2022)   No facility-administered encounter medications on file as of 03/02/2022.     Lab Results  Component Value Date   WBC 6.1 11/21/2021    HGB 12.9 11/21/2021   HCT 38.3 11/21/2021   PLT 186.0 11/21/2021   GLUCOSE 82 01/04/2022   CHOL 106 11/21/2021   TRIG 104.0 11/21/2021   HDL 36.00 (L) 11/21/2021   LDLCALC 49 11/21/2021   ALT 15 11/21/2021   AST 16 11/21/2021   NA 136 01/04/2022   K 3.9 01/04/2022   CL 101 01/04/2022   CREATININE 0.91 01/04/2022   BUN 16 01/04/2022   CO2 28 01/04/2022   TSH 3.10 05/02/2021    DG Bone Density  Result Date: 12/15/2021 EXAM: DUAL X-RAY ABSORPTIOMETRY (DXA) FOR BONE MINERAL DENSITY IMPRESSION: Your patient Alexandra Mendoza completed a BMD test on 12/15/2021 using the [place] (software version: 14.10) manufactured by [employer]. The following summarizes the results of our evaluation. Technologist:VLM PATIENT BIOGRAPHICAL: Name: Alexandra, Mendoza Patient ID: [id number] Birth Date: 10/27/1950 Height: 69.0 in. Gender: Female Exam Date: 12/15/2021 Weight: 169.0 lbs. Indications: Postmenopausal, Parent Hip Fracture, Family Hist. (Parent hip fracture), Caucasian Fractures: Treatments: Calcium DENSITOMETRY RESULTS: Site      Region     Measured Date Measured Age WHO Classification Young Adult T-score BMD         %Change vs. Previous Significant Change (*) AP Spine L1-L4 (L2) 12/15/2021 70.5 Normal 0.7 1.273 g/cm2 6.0% Yes AP Spine L1-L4 (L2) 05/16/2017 65.9 Normal 0.1 1.201 g/cm2 -5.4% Yes AP Spine L1-L4 (L2) 04/03/2012 60.8 Normal 0.7 1.270 g/cm2 -0.2% -  AP Spine L1-L4 (L2) 01/13/2009 57.6 Normal 0.7 1.272 g/cm2 - - DualFemur Total Left 12/15/2021 70.5 Osteopenia -1.9 0.774 g/cm2 -5.0% Yes DualFemur Total Left 05/16/2017 65.9 Osteopenia -1.5 0.815 g/cm2 -11.7% Yes DualFemur Total Left 04/03/2012 60.8 Normal -0.7 0.923 g/cm2 -1.7% - DualFemur Total Left 01/13/2009 57.6 Normal -0.5 0.939 g/cm2 - - DualFemur Total Mean 12/15/2021 70.5 Osteopenia -1.8 0.779 g/cm2 -6.5% Yes DualFemur Total Mean 05/16/2017 65.9 Osteopenia -1.4 0.833 g/cm2 -12.6% Yes DualFemur Total Mean 04/03/2012 60.8  Normal -0.4 0.953 g/cm2 -0.1% - DualFemur Total Mean 01/13/2009 57.6 Normal -0.4 0.954 g/cm2 - - ASSESSMENT: The BMD measured at Femur Total Left is 0.774 g/cm2 with a T-score of -1.9. This patient is considered osteopenic according to [facility]) criteria. L-2 was excluded due to degenerative changes. Compared with prior study, there has been significant increase in the spine. Compared with prior study, there has been significant decrease in the total hip. The scan quality is good. World Pharmacologist [facility]) criteria for post-menopausal, Caucasian Women: Normal:                   T-score at or above -1 SD Osteopenia/low bone mass: T-score between -1 and -2.5 SD Osteoporosis:             T-score at or below -2.5 SD RECOMMENDATIONS: 1. All patients should optimize calcium and vitamin D intake. 2. Consider FDA-approved medical therapies in postmenopausal women and men aged 33 years and older, based on the following: a. A hip or vertebral(clinical or morphometric) fracture b. T-score < -2.5 at the femoral neck or spine after appropriate evaluation to exclude secondary causes c. Low bone mass (T-score between -1.0 and -2.5 at the femoral neck or spine) and a 10-year probability of a hip fracture > 3% or a 10-year probability of a major osteoporosis-related fracture > 20% based on the [place]-adapted WHO algorithm 3. Clinician judgment and/or patient preferences may indicate treatment for people with 10-year fracture probabilities above or below these levels FOLLOW-UP: People with diagnosed cases of osteoporosis or at high risk for fracture should have regular bone mineral density tests. For patients eligible for Medicare, routine testing is allowed once every 2 years. The testing frequency can be increased to one year for patients who have rapidly progressing disease, those who are receiving or discontinuing medical therapy to restore bone mass, or have additional risk factors. I have reviewed this  report, and agree with the above findings. [facility] Dear Einar Pheasant, Your patient AMAL RENBARGER completed a FRAX assessment on 12/15/2021 using the [place] (analysis version: 14.10) manufactured by [employer]. The following summarizes the results of our evaluation. PATIENT BIOGRAPHICAL: Name: Alexandra, Mendoza Patient ID: [id number] Birth Date: Feb 08, 1951 Height:    69.0 in. Gender:     Female    [age]       Weight:    169.0 lbs. Ethnicity:  White                            Exam Date: 12/15/2021 FRAX* RESULTS:  (version: 3.5) 10-year Probability of Fracture1 Major Osteoporotic Fracture2 Hip Fracture 16.8% 4.0% Population: [place] (Caucasian) Risk Factors: Family Hist. (Parent hip fracture) Based on Femur (Left) Neck BMD 1 -The 10-year probability of fracture may be lower than reported if the patient has received treatment. 2 -Major Osteoporotic Fracture: Clinical Spine, Forearm, Hip or  Shoulder *FRAX is a Materials engineer of the [employer] for Metabolic Bone Disease, a [place] (WHO) [facility]. ASSESSMENT: The probability of a major osteoporotic fracture is 16.8% within the next ten years. The probability of a hip fracture is 4.0% within the next ten years. . Electronically Signed   By: Elmer Picker M.D.   On: 12/15/2021 11:51       Assessment & Plan:   Problem List Items Addressed This Visit     Adrenal benign neoplasm    Has been evaluated by urology previously.  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  No further w/up warranted.        Anxiety    Continues trazodone at night.  Overall appears to be doing relatively well.  Follow       Aortic atherosclerosis (HCC)    Continue lipitor.       CAD (coronary artery disease)    Sees Dr Nehemiah Massed.  Continue risk factor modification.  Stable.       Esophagitis, reflux    No upper symptoms reported.  On protonix.        Essential  hypertension - Primary    Currently on coreg, amlodipine, hctz, avapro and aldactone.  Previous abdominal ultrasound - kidneys relatively equal size, etc. Saw nephrology.  No changes made.  Pressure better.  Follow.        Relevant Orders   Basic Metabolic Panel (BMET)   Hypercholesterolemia    Continue lipitor.  Low cholesterol diet and exercise.  Follow lipid panel and liver function tests.       Relevant Orders   Lipid Profile   Hepatic function panel   TSH   Neuropathy    NCS revealed polyneuropathy.  Continue lyrica. Discussed adjusting dose of lyrica.  Follow.       Thrombocytopenia ([place])    Follow cbc.         Einar Pheasant, MD

## 2022-03-08 DIAGNOSIS — H2513 Age-related nuclear cataract, bilateral: Secondary | ICD-10-CM | POA: Diagnosis not present

## 2022-03-12 ENCOUNTER — Encounter: Payer: Self-pay | Admitting: Internal Medicine

## 2022-03-12 NOTE — Assessment & Plan Note (Signed)
Currently on coreg, amlodipine, hctz, avapro and aldactone.  Previous abdominal ultrasound - kidneys relatively equal size, etc. Saw nephrology.  No changes made.  Pressure better.  Follow.   

## 2022-03-12 NOTE — Assessment & Plan Note (Signed)
NCS revealed polyneuropathy.  Continue lyrica. Discussed adjusting dose of lyrica.  Follow.

## 2022-03-12 NOTE — Assessment & Plan Note (Signed)
Continue lipitor  ?

## 2022-03-12 NOTE — Assessment & Plan Note (Signed)
No upper symptoms reported.  On protonix.   

## 2022-03-12 NOTE — Assessment & Plan Note (Signed)
Continue lipitor.  Low cholesterol diet and exercise.  Follow lipid panel and liver function tests.   

## 2022-03-12 NOTE — Assessment & Plan Note (Signed)
Sees Dr Nehemiah Massed.  Continue risk factor modification.  Stable.

## 2022-03-12 NOTE — Assessment & Plan Note (Signed)
Continues trazodone at night.  Overall appears to be doing relatively well.  Follow  

## 2022-03-12 NOTE — Assessment & Plan Note (Signed)
Has been evaluated by urology previously.  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  No further w/up warranted.   

## 2022-03-12 NOTE — Assessment & Plan Note (Signed)
Follow cbc.  

## 2022-03-22 ENCOUNTER — Encounter: Payer: Self-pay | Admitting: Internal Medicine

## 2022-03-22 NOTE — Telephone Encounter (Signed)
Thanks for the update. Notify Ms Troiano that I am ok for her to continue taking the lyrica as she is doing.  Let us know if any problems.

## 2022-03-27 ENCOUNTER — Other Ambulatory Visit: Payer: Self-pay | Admitting: Internal Medicine

## 2022-03-27 DIAGNOSIS — Z1231 Encounter for screening mammogram for malignant neoplasm of breast: Secondary | ICD-10-CM

## 2022-04-06 ENCOUNTER — Other Ambulatory Visit: Payer: Self-pay | Admitting: Internal Medicine

## 2022-04-11 ENCOUNTER — Other Ambulatory Visit: Payer: Self-pay | Admitting: Internal Medicine

## 2022-04-13 ENCOUNTER — Other Ambulatory Visit (INDEPENDENT_AMBULATORY_CARE_PROVIDER_SITE_OTHER): Payer: PPO

## 2022-04-13 DIAGNOSIS — R944 Abnormal results of kidney function studies: Secondary | ICD-10-CM

## 2022-04-13 DIAGNOSIS — I10 Essential (primary) hypertension: Secondary | ICD-10-CM

## 2022-04-13 DIAGNOSIS — Z1231 Encounter for screening mammogram for malignant neoplasm of breast: Secondary | ICD-10-CM

## 2022-04-13 DIAGNOSIS — E7800 Pure hypercholesterolemia, unspecified: Secondary | ICD-10-CM | POA: Diagnosis not present

## 2022-04-13 LAB — HEPATIC FUNCTION PANEL
ALT: 19 U/L (ref 0–35)
AST: 17 U/L (ref 0–37)
Albumin: 4.3 g/dL (ref 3.5–5.2)
Alkaline Phosphatase: 37 U/L — ABNORMAL LOW (ref 39–117)
Bilirubin, Direct: 0.1 mg/dL (ref 0.0–0.3)
Total Bilirubin: 0.5 mg/dL (ref 0.2–1.2)
Total Protein: 6.4 g/dL (ref 6.0–8.3)

## 2022-04-13 LAB — BASIC METABOLIC PANEL
BUN: 18 mg/dL (ref 6–23)
CO2: 30 mEq/L (ref 19–32)
Calcium: 10.2 mg/dL (ref 8.4–10.5)
Chloride: 102 mEq/L (ref 96–112)
Creatinine, Ser: 0.89 mg/dL (ref 0.40–1.20)
GFR: 65.47 mL/min (ref >60.00)
Glucose, Bld: 81 mg/dL (ref 70–99)
Potassium: 3.9 mEq/L (ref 3.5–5.1)
Sodium: 138 mEq/L (ref 135–145)

## 2022-04-13 LAB — TSH: TSH: 4.01 u[IU]/mL (ref 0.35–5.50)

## 2022-04-13 LAB — LIPID PANEL
Cholesterol: 103 mg/dL (ref 0–200)
HDL: 34.3 mg/dL — ABNORMAL LOW (ref >39.00)
LDL Cholesterol: 54 mg/dL (ref 0–99)
NonHDL: 68.86
Total CHOL/HDL Ratio: 3
Triglycerides: 73 mg/dL (ref 0.0–149.0)
VLDL: 14.6 mg/dL (ref 0.0–40.0)

## 2022-04-14 LAB — PARATHYROID HORMONE, INTACT (NO CA): PTH: 28 pg/mL (ref 16–77)

## 2022-05-20 ENCOUNTER — Other Ambulatory Visit: Payer: Self-pay | Admitting: Internal Medicine

## 2022-05-24 DIAGNOSIS — L728 Other follicular cysts of the skin and subcutaneous tissue: Secondary | ICD-10-CM | POA: Diagnosis not present

## 2022-05-24 DIAGNOSIS — L820 Inflamed seborrheic keratosis: Secondary | ICD-10-CM | POA: Diagnosis not present

## 2022-05-24 DIAGNOSIS — D2261 Melanocytic nevi of right upper limb, including shoulder: Secondary | ICD-10-CM | POA: Diagnosis not present

## 2022-05-24 DIAGNOSIS — D2272 Melanocytic nevi of left lower limb, including hip: Secondary | ICD-10-CM | POA: Diagnosis not present

## 2022-05-24 DIAGNOSIS — L821 Other seborrheic keratosis: Secondary | ICD-10-CM | POA: Diagnosis not present

## 2022-05-24 DIAGNOSIS — D2271 Melanocytic nevi of right lower limb, including hip: Secondary | ICD-10-CM | POA: Diagnosis not present

## 2022-05-24 DIAGNOSIS — L814 Other melanin hyperpigmentation: Secondary | ICD-10-CM | POA: Diagnosis not present

## 2022-05-24 DIAGNOSIS — L570 Actinic keratosis: Secondary | ICD-10-CM | POA: Diagnosis not present

## 2022-05-24 DIAGNOSIS — D2262 Melanocytic nevi of left upper limb, including shoulder: Secondary | ICD-10-CM | POA: Diagnosis not present

## 2022-05-24 DIAGNOSIS — D225 Melanocytic nevi of trunk: Secondary | ICD-10-CM | POA: Diagnosis not present

## 2022-05-24 DIAGNOSIS — D485 Neoplasm of uncertain behavior of skin: Secondary | ICD-10-CM | POA: Diagnosis not present

## 2022-06-05 ENCOUNTER — Ambulatory Visit (INDEPENDENT_AMBULATORY_CARE_PROVIDER_SITE_OTHER): Payer: PPO | Admitting: Internal Medicine

## 2022-06-05 ENCOUNTER — Encounter: Payer: Self-pay | Admitting: Internal Medicine

## 2022-06-05 VITALS — BP 124/60 | HR 67 | Temp 98.4°F | Ht 69.0 in | Wt 172.4 lb

## 2022-06-05 DIAGNOSIS — F419 Anxiety disorder, unspecified: Secondary | ICD-10-CM | POA: Diagnosis not present

## 2022-06-05 DIAGNOSIS — G629 Polyneuropathy, unspecified: Secondary | ICD-10-CM | POA: Diagnosis not present

## 2022-06-05 DIAGNOSIS — D3502 Benign neoplasm of left adrenal gland: Secondary | ICD-10-CM | POA: Diagnosis not present

## 2022-06-05 DIAGNOSIS — K2100 Gastro-esophageal reflux disease with esophagitis, without bleeding: Secondary | ICD-10-CM

## 2022-06-05 DIAGNOSIS — I10 Essential (primary) hypertension: Secondary | ICD-10-CM | POA: Diagnosis not present

## 2022-06-05 DIAGNOSIS — I700 Atherosclerosis of aorta: Secondary | ICD-10-CM | POA: Diagnosis not present

## 2022-06-05 DIAGNOSIS — I2510 Atherosclerotic heart disease of native coronary artery without angina pectoris: Secondary | ICD-10-CM

## 2022-06-05 DIAGNOSIS — D696 Thrombocytopenia, unspecified: Secondary | ICD-10-CM

## 2022-06-05 DIAGNOSIS — E7800 Pure hypercholesterolemia, unspecified: Secondary | ICD-10-CM

## 2022-06-05 NOTE — Progress Notes (Signed)
[name], female   DOB: April 14, 1951, 71 y.o.   MRN: [id number]   Subjective:    [name], female    DOB: [birth year], 71 y.o.   MRN: [id number]   Patient here for  Chief Complaint  Patient presents with   Follow-up    3 month follow up   .   HPI Here to follow up regarding her blood pressure, cholesterol and neuropathy.  Reports she is doing relatively well.  Tries to stay active.  No chest pain or sob reported.  Eating.  No nausea or vomiting reported.  No abdominal pain.  Taking probiotics.  Keeping her regular.  On lyrica - for neuropathy. Doing well on current regimen.  Blood pressure averaging 130s/50-60.     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)   Heart murmur    History of chicken pox    Hyperlipidemia    Hypertension    Insomnia    Past Surgical History:  Procedure Laterality Date   APPENDECTOMY     BREAST EXCISIONAL BIOPSY Right    negative over 5 years ago- neg   BREAST SURGERY     Biopsy   COLONOSCOPY WITH PROPOFOL N/A 05/16/2016   Procedure: COLONOSCOPY WITH PROPOFOL;  Surgeon: [name], MD;  Location: [facility];  Service: Endoscopy;  Laterality: N/A;   CORONARY ARTERY BYPASS GRAFT  05/01/2014   3 vessel, [facility]   ESOPHAGOGASTRODUODENOSCOPY (EGD) WITH PROPOFOL N/A 02/23/2017   Procedure: ESOPHAGOGASTRODUODENOSCOPY (EGD) WITH PROPOFOL;  Surgeon: [name], MD;  Location: [place];  Service: Endoscopy;  Laterality: N/A;   HYSTEROSCOPY WITH D & C     lipoma removed     removed from forehead   triple bypass     Family History  Problem Relation Age of Onset   Breast cancer Other        maternal great aunt   Heart disease Other        multiple family members   Heart disease Mother    Stroke Mother    Alzheimer's disease Mother    Heart disease Father    Stroke Father    Breast cancer Sister 16    Alzheimer's disease Maternal Aunt    Alzheimer's disease Maternal Uncle    Alzheimer's disease Maternal Grandmother    Colon cancer Neg Hx    Diabetes Neg Hx    Ovarian cancer Neg Hx    Social History   Socioeconomic History   Marital status: Married    Spouse name: Not on file   Number of children: Not on file   Years of education: Not on file   Highest education level: Not on file  Occupational History   Not on file  Tobacco Use   Smoking status: Never   Smokeless tobacco: Never  Vaping Use   Vaping Use: Never used  Substance and Sexual Activity   Alcohol use: Never    Alcohol/week: 0.0 standard drinks of alcohol   Drug use: No   Sexual activity: Yes    Birth control/protection: Post-menopausal  Other Topics Concern   Not on file  Social History Narrative   Not on file   Social Determinants of Health   Financial Resource Strain: Low Risk  (09/20/2021)   Overall Financial Resource Strain (CARDIA)    Difficulty of Paying Living  Expenses: Not hard at all  Food Insecurity: No Food Insecurity (09/20/2021)   Hunger Vital Sign    Worried About Running Out of Food in the Last Year: Never true    Ran Out of Food in the Last Year: Never true  Transportation Needs: No Transportation Needs (09/20/2021)   PRAPARE - Hydrologist (Medical): No    Lack of Transportation (Non-Medical): No  Physical Activity: Sufficiently Active (09/20/2021)   Exercise Vital Sign    Days of Exercise per Week: 3 days    Minutes of Exercise per Session: 50 min  Stress: No Stress Concern Present (09/20/2021)   [place]    Feeling of Stress : Not at all  Social Connections: Unknown (09/20/2021)   Social Connection and Isolation Panel [NHANES]    Frequency of Communication with Friends and Family: More than three times a week    Frequency of Social Gatherings with Friends and Family: More than three times a  week    Attends Religious Services: More than 4 times per year    Active Member of [employer] or Organizations: Yes    Attends Music therapist: More than 4 times per year    Marital Status: Not on file     Review of Systems  Constitutional:  Negative for appetite change and unexpected weight change.  HENT:  Negative for congestion and sinus pressure.   Respiratory:  Negative for cough, chest tightness and shortness of breath.   Cardiovascular:  Negative for chest pain, palpitations and leg swelling.  Gastrointestinal:  Negative for abdominal pain, diarrhea, nausea and vomiting.  Genitourinary:  Negative for difficulty urinating and dysuria.  Musculoskeletal:  Negative for joint swelling and myalgias.  Skin:  Negative for color change and rash.  Neurological:  Negative for dizziness, light-headedness and headaches.  Psychiatric/Behavioral:  Negative for agitation and dysphoric mood.        Objective:     BP 124/60 (BP Location: Right Arm, Patient Position: Sitting, Cuff Size: Normal)   Pulse 67   Temp 98.4 F (36.9 C) (Oral)   Ht '5\' 9"'$  (1.753 m)   Wt 172 lb 6.4 oz (78.2 kg)   SpO2 96%   BMI 25.46 kg/m  Wt Readings from Last 3 Encounters:  06/05/22 172 lb 6.4 oz (78.2 kg)  03/02/22 171 lb 6.4 oz (77.7 kg)  11/01/21 171 lb 9.6 oz (77.8 kg)    Physical Exam Vitals reviewed.  Constitutional:      General: She is not in acute distress.    Appearance: Normal appearance.  HENT:     Head: Normocephalic and atraumatic.     Right Ear: External ear normal.     Left Ear: External ear normal.  Eyes:     General: No scleral icterus.       Right eye: No discharge.        Left eye: No discharge.     Conjunctiva/sclera: Conjunctivae normal.  Neck:     Thyroid: No thyromegaly.  Cardiovascular:     Rate and Rhythm: Normal rate and regular rhythm.  Pulmonary:     Effort: No respiratory distress.     Breath sounds: Normal breath sounds. No wheezing.  Abdominal:      General: Bowel sounds are normal.     Palpations: Abdomen is soft.     Tenderness: There is no abdominal tenderness.  Musculoskeletal:        General:  No swelling or tenderness.     Cervical back: Neck supple. No tenderness.  Lymphadenopathy:     Cervical: No cervical adenopathy.  Skin:    Findings: No erythema or rash.  Neurological:     Mental Status: She is alert.  Psychiatric:        Mood and Affect: Mood normal.        Behavior: Behavior normal.      Outpatient Encounter Medications as of 06/05/2022  Medication Sig   Alpha-Lipoic Acid 100 MG CAPS Take 600 mg by mouth daily.   amLODipine (NORVASC) 10 MG tablet Take 10 mg by mouth daily.   ascorbic acid (VITAMIN C) 1000 MG tablet Take by mouth.   aspirin EC 81 MG tablet Take 81 mg by mouth daily.   calcium-vitamin D (OSCAL WITH D) 500-200 MG-UNIT TABS tablet Take by mouth.   carvedilol (COREG) 6.25 MG tablet Take 6.25 mg by mouth 2 (two) times daily.   cyanocobalamin 1000 MCG tablet Take 1,000 mcg by mouth daily.   Docusate Calcium (STOOL SOFTENER PO) Take by mouth.   hydrochlorothiazide (MICROZIDE) 12.5 MG capsule TAKE 1 CAPSULE BY MOUTH EVERY DAY   irbesartan (AVAPRO) 300 MG tablet TAKE 1 TABLET BY MOUTH EVERY DAY   Omega-3 Fatty Acids (FISH OIL) 1000 MG CAPS Take 1,200 mg by mouth daily.   pantoprazole (PROTONIX) 40 MG tablet TAKE 1 TABLET (40 MG TOTAL) BY MOUTH DAILY TAKE 30 MINUTES BEFORE BREAKFAST   pregabalin (LYRICA) 75 MG capsule Take 1 capsule by mouth twice daily   Probiotic Product (PROBIOTIC PO) Take by mouth.   spironolactone (ALDACTONE) 25 MG tablet Take 25 mg by mouth daily.   traZODone (DESYREL) 50 MG tablet TAKE 1-2 TABLETS (50-100 MG TOTAL) BY MOUTH AT BEDTIME AS NEEDED.   atorvastatin (LIPITOR) 40 MG tablet Take by mouth.   No facility-administered encounter medications on file as of 06/05/2022.     Lab Results  Component Value Date   WBC 6.1 11/21/2021   HGB 12.9 11/21/2021   HCT 38.3 11/21/2021    PLT 186.0 11/21/2021   GLUCOSE 81 04/13/2022   CHOL 103 04/13/2022   TRIG 73.0 04/13/2022   HDL 34.30 (L) 04/13/2022   LDLCALC 54 04/13/2022   ALT 19 04/13/2022   AST 17 04/13/2022   NA 138 04/13/2022   K 3.9 04/13/2022   CL 102 04/13/2022   CREATININE 0.89 04/13/2022   BUN 18 04/13/2022   CO2 30 04/13/2022   TSH 4.01 04/13/2022    DG Bone Density  Result Date: 12/15/2021 EXAM: DUAL X-RAY ABSORPTIOMETRY (DXA) FOR BONE MINERAL DENSITY IMPRESSION: Your patient Sevannah Madia completed a BMD test on 12/15/2021 using the [employer] (software version: 14.10) manufactured by [employer]. The following summarizes the results of our evaluation. Technologist:VLM PATIENT BIOGRAPHICAL: Name: Lunna, Vogelgesang Patient ID: [id number] Birth Date: 1951/05/10 Height: 69.0 in. Gender: Female Exam Date: 12/15/2021 Weight: 169.0 lbs. Indications: Postmenopausal, Parent Hip Fracture, Family Hist. (Parent hip fracture), Caucasian Fractures: Treatments: Calcium DENSITOMETRY RESULTS: Site      Region     Measured Date Measured Age WHO Classification Young Adult T-score BMD         %Change vs. Previous Significant Change (*) AP Spine L1-L4 (L2) 12/15/2021 70.5 Normal 0.7 1.273 g/cm2 6.0% Yes AP Spine L1-L4 (L2) 05/16/2017 65.9 Normal 0.1 1.201 g/cm2 -5.4% Yes AP Spine L1-L4 (L2) 04/03/2012 60.8 Normal 0.7 1.270 g/cm2 -0.2% - AP Spine L1-L4 (L2) 01/13/2009 57.6 Normal  0.7 1.272 g/cm2 - - DualFemur Total Left 12/15/2021 70.5 Osteopenia -1.9 0.774 g/cm2 -5.0% Yes DualFemur Total Left 05/16/2017 65.9 Osteopenia -1.5 0.815 g/cm2 -11.7% Yes DualFemur Total Left 04/03/2012 60.8 Normal -0.7 0.923 g/cm2 -1.7% - DualFemur Total Left 01/13/2009 57.6 Normal -0.5 0.939 g/cm2 - - DualFemur Total Mean 12/15/2021 70.5 Osteopenia -1.8 0.779 g/cm2 -6.5% Yes DualFemur Total Mean 05/16/2017 65.9 Osteopenia -1.4 0.833 g/cm2 -12.6% Yes DualFemur Total Mean 04/03/2012 60.8 Normal -0.4 0.953 g/cm2 -0.1% - DualFemur Total  Mean 01/13/2009 57.6 Normal -0.4 0.954 g/cm2 - - ASSESSMENT: The BMD measured at Femur Total Left is 0.774 g/cm2 with a T-score of -1.9. This patient is considered osteopenic according to [facility]) criteria. L-2 was excluded due to degenerative changes. Compared with prior study, there has been significant increase in the spine. Compared with prior study, there has been significant decrease in the total hip. The scan quality is good. World Pharmacologist [facility]) criteria for post-menopausal, Caucasian Women: Normal:                   T-score at or above -1 SD Osteopenia/low bone mass: T-score between -1 and -2.5 SD Osteoporosis:             T-score at or below -2.5 SD RECOMMENDATIONS: 1. All patients should optimize calcium and vitamin D intake. 2. Consider FDA-approved medical therapies in postmenopausal women and men aged 19 years and older, based on the following: a. A hip or vertebral(clinical or morphometric) fracture b. T-score < -2.5 at the femoral neck or spine after appropriate evaluation to exclude secondary causes c. Low bone mass (T-score between -1.0 and -2.5 at the femoral neck or spine) and a 10-year probability of a hip fracture > 3% or a 10-year probability of a major osteoporosis-related fracture > 20% based on the [place]-adapted WHO algorithm 3. Clinician judgment and/or patient preferences may indicate treatment for people with 10-year fracture probabilities above or below these levels FOLLOW-UP: People with diagnosed cases of osteoporosis or at high risk for fracture should have regular bone mineral density tests. For patients eligible for Medicare, routine testing is allowed once every 2 years. The testing frequency can be increased to one year for patients who have rapidly progressing disease, those who are receiving or discontinuing medical therapy to restore bone mass, or have additional risk factors. I have reviewed this report, and agree with the above findings.  [facility] Dear Einar Pheasant, Your patient ANJA NEUZIL completed a FRAX assessment on 12/15/2021 using the [place] (analysis version: 14.10) manufactured by [employer]. The following summarizes the results of our evaluation. PATIENT BIOGRAPHICAL: Name: Hlee, Fringer Patient ID: [id number] Birth Date: 1951-04-03 Height:    69.0 in. Gender:     Female    [age]       Weight:    169.0 lbs. Ethnicity:  White                            Exam Date: 12/15/2021 FRAX* RESULTS:  (version: 3.5) 10-year Probability of Fracture1 Major Osteoporotic Fracture2 Hip Fracture 16.8% 4.0% Population: [place] (Caucasian) Risk Factors: Family Hist. (Parent hip fracture) Based on Femur (Left) Neck BMD 1 -The 10-year probability of fracture may be lower than reported if the patient has received treatment. 2 -Major Osteoporotic Fracture: Clinical Spine, Forearm, Hip or Shoulder *FRAX is a trademark of the  [employer] Metabolic Bone Disease, a World Pharmacologist (WHO) [facility]. ASSESSMENT: The probability of a major osteoporotic fracture is 16.8% within the next ten years. The probability of a hip fracture is 4.0% within the next ten years. . Electronically Signed   By: Elmer Picker M.D.   On: 12/15/2021 11:51       Assessment & Plan:   Problem List Items Addressed This Visit     Adrenal benign neoplasm    Has been evaluated by urology previously.  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  No further w/up warranted.        Anxiety    Continues trazodone at night.  Overall appears to be doing relatively well.  Follow       Aortic atherosclerosis (HCC)    Continue lipitor.       CAD (coronary artery disease)    Sees Dr Nehemiah Massed.  Continue risk factor modification.  Stable.       Esophagitis, reflux    No upper symptoms reported.  On protonix.        Essential hypertension    Currently on coreg,  amlodipine, hctz, avapro and aldactone.  Previous abdominal ultrasound - kidneys relatively equal size, etc. Saw nephrology.  No changes made.  Pressure better.  Follow.        Relevant Orders   Basic metabolic panel   Hypercholesterolemia - Primary    Continue lipitor.  Low cholesterol diet and exercise.  Follow lipid panel and liver function tests.       Relevant Orders   Lipid panel   Hepatic function panel   Neuropathy    NCS revealed polyneuropathy.  Continue lyrica at current dose. Follow.       Thrombocytopenia ([place])    Follow cbc.         Einar Pheasant, MD

## 2022-06-08 ENCOUNTER — Ambulatory Visit
Admission: RE | Admit: 2022-06-08 | Discharge: 2022-06-08 | Disposition: A | Discharge status: Home / Self Care | Payer: PPO | Source: Ambulatory Visit | Attending: Internal Medicine | Admitting: Internal Medicine

## 2022-06-08 DIAGNOSIS — Z1231 Encounter for screening mammogram for malignant neoplasm of breast: Secondary | ICD-10-CM | POA: Diagnosis not present

## 2022-06-11 ENCOUNTER — Encounter: Payer: Self-pay | Admitting: Internal Medicine

## 2022-06-11 NOTE — Assessment & Plan Note (Signed)
Continue lipitor  ?

## 2022-06-11 NOTE — Assessment & Plan Note (Signed)
Sees Dr Nehemiah Massed.  Continue risk factor modification.  Stable.

## 2022-06-11 NOTE — Assessment & Plan Note (Signed)
Has been evaluated by urology previously.  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  No further w/up warranted.   

## 2022-06-11 NOTE — Assessment & Plan Note (Signed)
Follow cbc.  

## 2022-06-11 NOTE — Assessment & Plan Note (Signed)
Continue lipitor.  Low cholesterol diet and exercise.  Follow lipid panel and liver function tests.   

## 2022-06-11 NOTE — Assessment & Plan Note (Signed)
Currently on coreg, amlodipine, hctz, avapro and aldactone.  Previous abdominal ultrasound - kidneys relatively equal size, etc. Saw nephrology.  No changes made.  Pressure better.  Follow.   

## 2022-06-11 NOTE — Assessment & Plan Note (Signed)
No upper symptoms reported.  On protonix.   

## 2022-06-11 NOTE — Assessment & Plan Note (Signed)
NCS revealed polyneuropathy.  Continue lyrica at current dose. Follow.

## 2022-06-11 NOTE — Assessment & Plan Note (Signed)
Continues trazodone at night.  Overall appears to be doing relatively well.  Follow  

## 2022-06-20 DIAGNOSIS — M25511 Pain in right shoulder: Secondary | ICD-10-CM | POA: Diagnosis not present

## 2022-06-20 DIAGNOSIS — M7541 Impingement syndrome of right shoulder: Secondary | ICD-10-CM | POA: Diagnosis not present

## 2022-06-22 ENCOUNTER — Other Ambulatory Visit: Payer: Self-pay | Admitting: Family

## 2022-06-25 ENCOUNTER — Other Ambulatory Visit: Payer: Self-pay | Admitting: Internal Medicine

## 2022-06-26 MED ORDER — PREGABALIN 75 MG PO CAPS: 75.0000 mg | ORAL_CAPSULE | Freq: Two times a day (BID) | ORAL | 2 refills | Status: DC

## 2022-06-26 NOTE — Telephone Encounter (Signed)
Rx ok'd for lyrica #60 with 2 refills.

## 2022-07-06 ENCOUNTER — Other Ambulatory Visit: Payer: Self-pay | Admitting: Family

## 2022-07-10 ENCOUNTER — Other Ambulatory Visit: Payer: Self-pay | Admitting: Internal Medicine

## 2022-07-11 MED ORDER — HYDROCHLOROTHIAZIDE 12.5 MG PO CAPS: 12.5000 mg | ORAL_CAPSULE | Freq: Every day | ORAL | 0 refills | Status: DC

## 2022-07-17 DIAGNOSIS — G8929 Other chronic pain: Secondary | ICD-10-CM | POA: Diagnosis not present

## 2022-07-17 DIAGNOSIS — M25511 Pain in right shoulder: Secondary | ICD-10-CM | POA: Diagnosis not present

## 2022-07-17 DIAGNOSIS — M25611 Stiffness of right shoulder, not elsewhere classified: Secondary | ICD-10-CM | POA: Diagnosis not present

## 2022-07-17 DIAGNOSIS — M25811 Other specified joint disorders, right shoulder: Secondary | ICD-10-CM | POA: Diagnosis not present

## 2022-07-17 DIAGNOSIS — M6281 Muscle weakness (generalized): Secondary | ICD-10-CM | POA: Diagnosis not present

## 2022-08-17 DIAGNOSIS — I10 Essential (primary) hypertension: Secondary | ICD-10-CM | POA: Diagnosis not present

## 2022-08-17 DIAGNOSIS — E782 Mixed hyperlipidemia: Secondary | ICD-10-CM | POA: Diagnosis not present

## 2022-08-17 DIAGNOSIS — I25810 Atherosclerosis of coronary artery bypass graft(s) without angina pectoris: Secondary | ICD-10-CM | POA: Diagnosis not present

## 2022-08-22 IMAGING — US US SOFT TISSUE HEAD/NECK
1 series · 14 of 17 positions shown · non-contrast
Comparison: None.

CLINICAL DATA: Perception of fullness/puffiness in the bilateral
submandibular regions, more so on the right than the left.

EXAM:
ULTRASOUND OF HEAD/NECK SOFT TISSUES
TECHNIQUE: Ultrasound examination of the head and neck soft tissues was
performed in the area of clinical concern.

[Series 1: us soft tissue head/neck · 0.07mm/px · 14 of 17 slices shown]
[im 1/17]
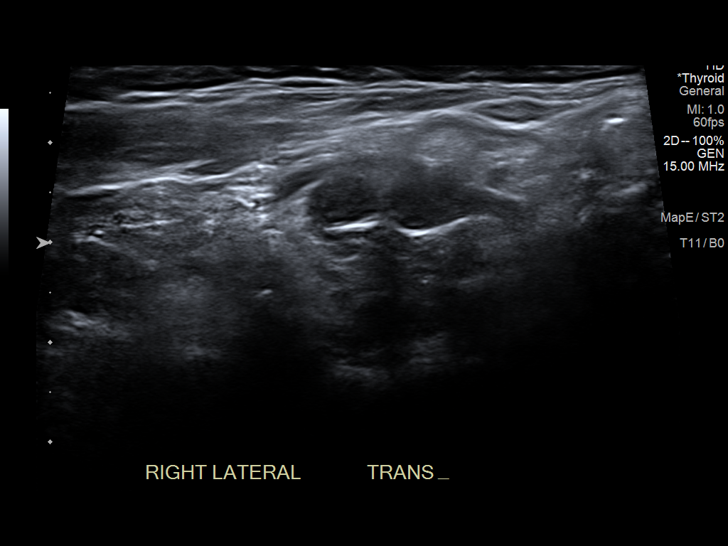
[im 2/17]
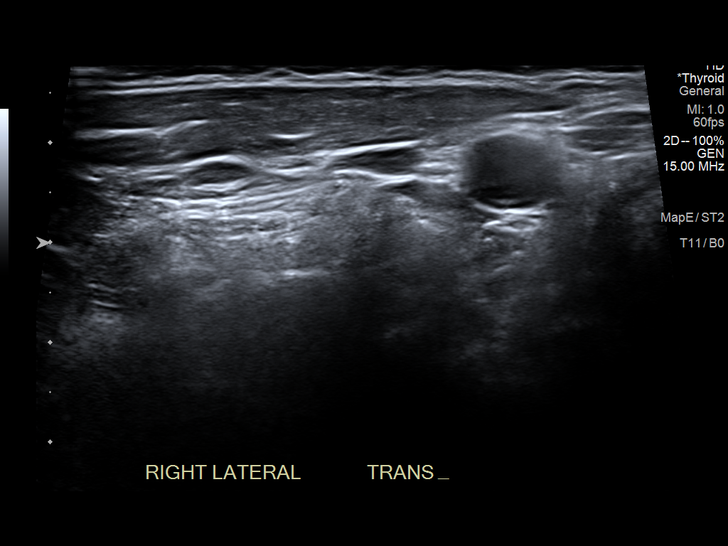
[im 4/17]
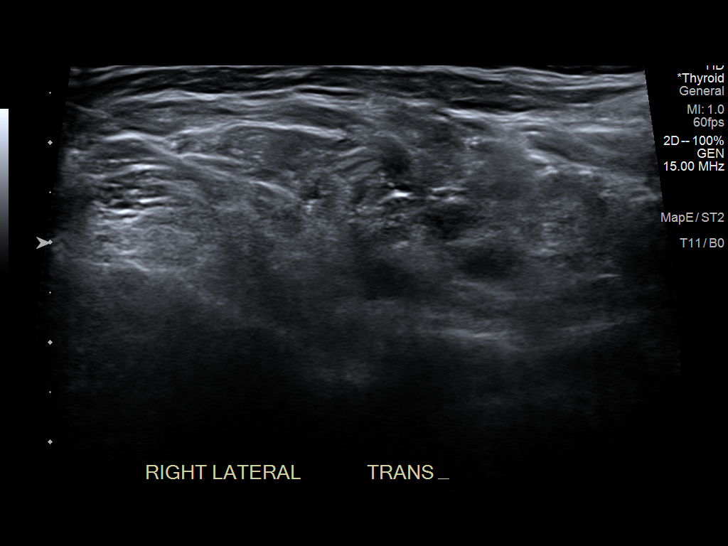
[im 5/17]
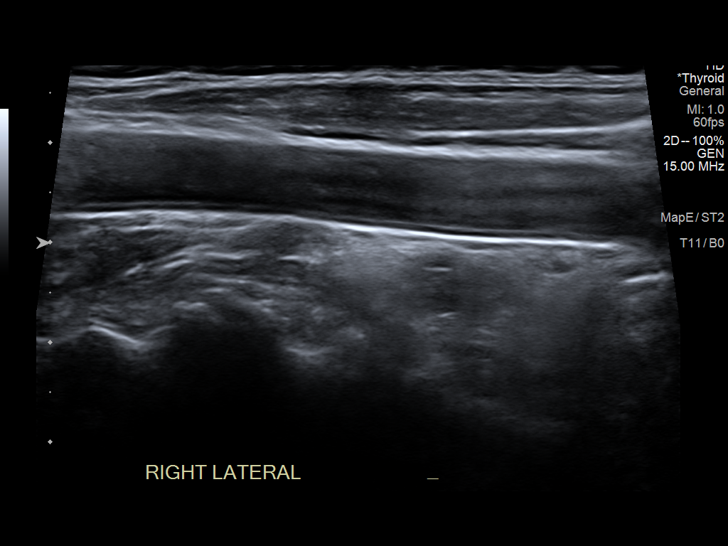
[im 6/17]
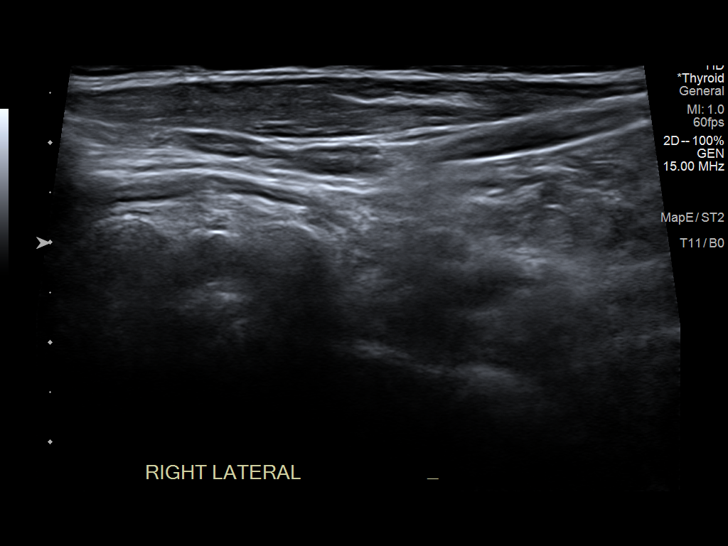
[im 7/17]
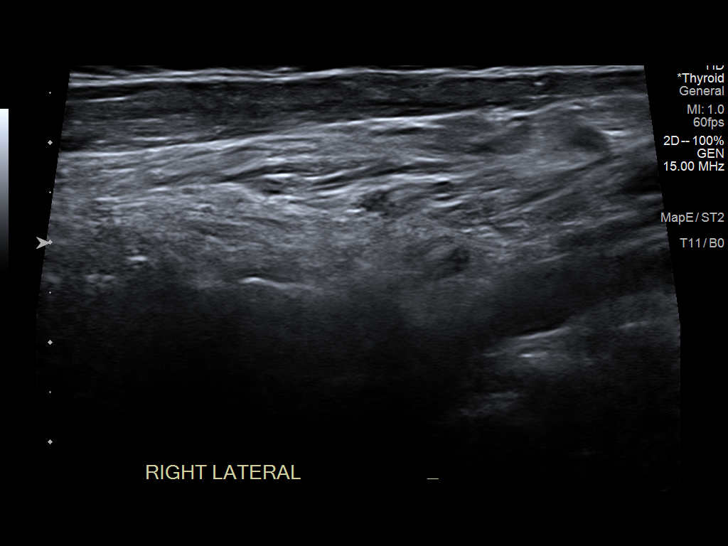
[im 8/17]
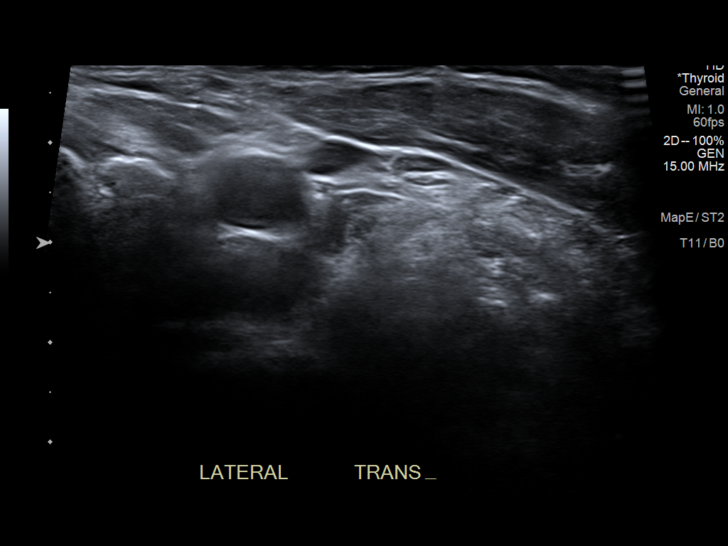
[im 10/17]
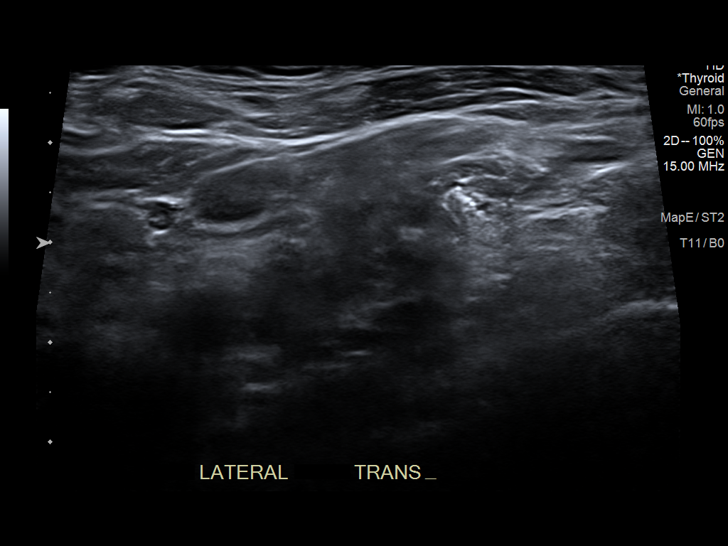
[im 11/17]
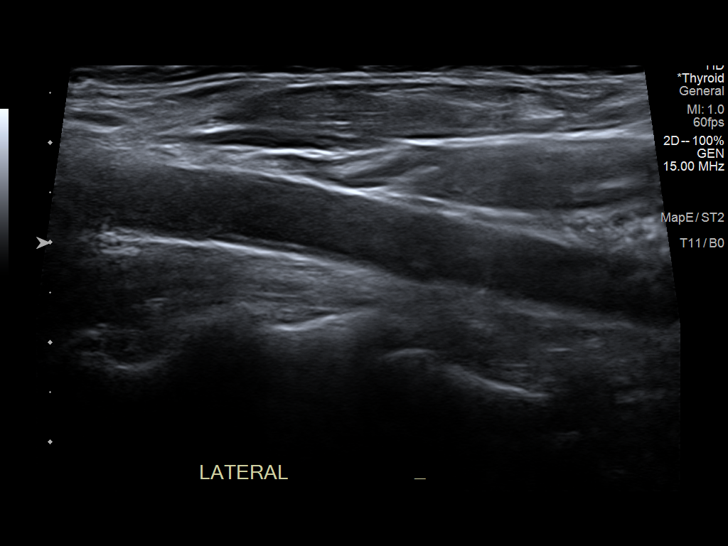
[im 12/17]
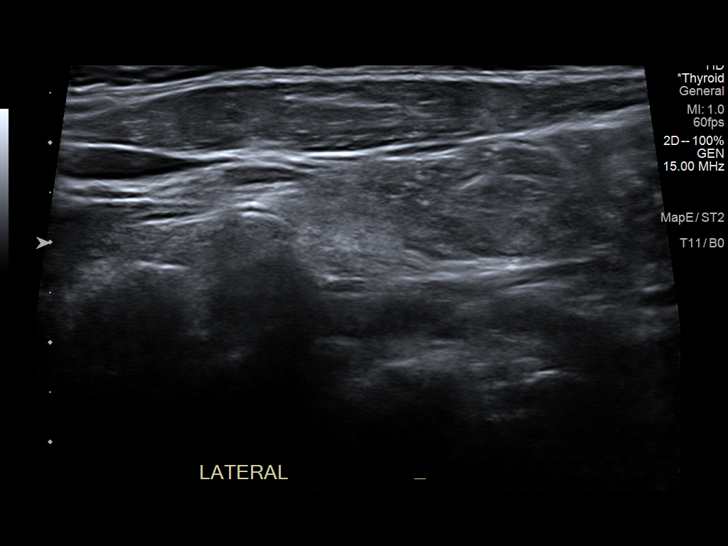
[im 13/17]
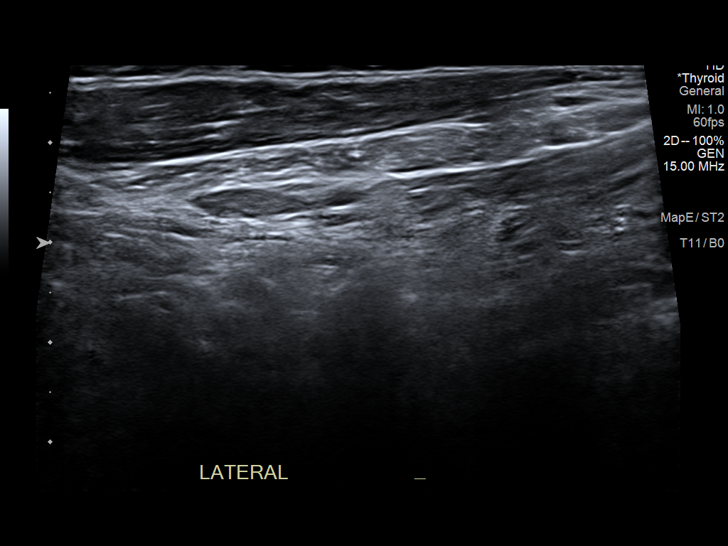
[im 14/17]
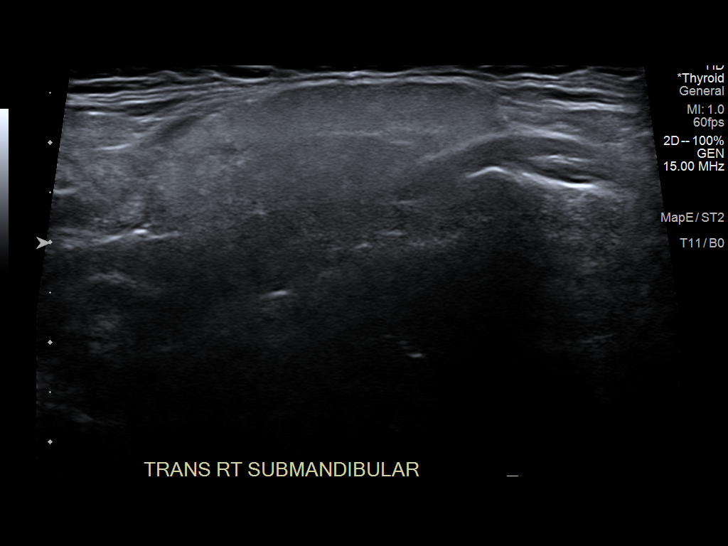
[im 16/17]
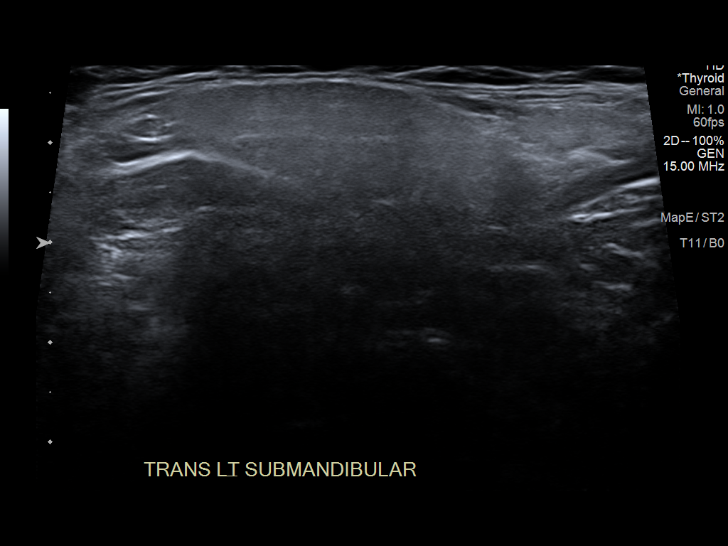
[im 17/17]
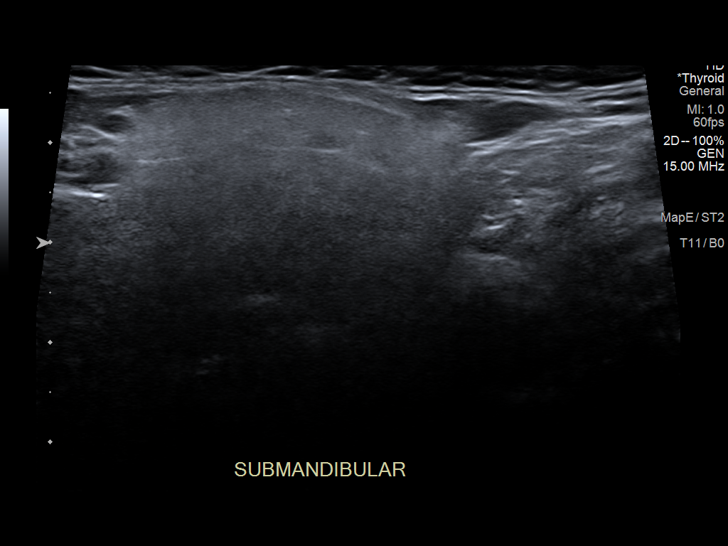

[14 of 17 positions shown; findings below may reference images not displayed]

FINDINGS: Sonographic interrogation of the submandibular regions was
performed. Normal subcutaneous fat, submandibular glands and
vascular structures identified. No evidence of lymphadenopathy,
solid mass or cystic mass. No other abnormality identified.
IMPRESSION: Negative sonographic survey of the submandibular regions.

## 2022-08-25 ENCOUNTER — Other Ambulatory Visit (INDEPENDENT_AMBULATORY_CARE_PROVIDER_SITE_OTHER): Payer: PPO

## 2022-08-25 DIAGNOSIS — E7800 Pure hypercholesterolemia, unspecified: Secondary | ICD-10-CM | POA: Diagnosis not present

## 2022-08-25 DIAGNOSIS — I10 Essential (primary) hypertension: Secondary | ICD-10-CM

## 2022-08-25 LAB — HEPATIC FUNCTION PANEL
ALT: 19 U/L (ref 0–35)
AST: 17 U/L (ref 0–37)
Albumin: 4.4 g/dL (ref 3.5–5.2)
Alkaline Phosphatase: 35 U/L — ABNORMAL LOW (ref 39–117)
Bilirubin, Direct: 0.1 mg/dL (ref 0.0–0.3)
Total Bilirubin: 0.5 mg/dL (ref 0.2–1.2)
Total Protein: 6.5 g/dL (ref 6.0–8.3)

## 2022-08-25 LAB — LIPID PANEL
Cholesterol: 100 mg/dL (ref 0–200)
HDL: 37.3 mg/dL — ABNORMAL LOW (ref >39.00)
LDL Cholesterol: 45 mg/dL (ref 0–99)
NonHDL: 62.4
Total CHOL/HDL Ratio: 3
Triglycerides: 87 mg/dL (ref 0.0–149.0)
VLDL: 17.4 mg/dL (ref 0.0–40.0)

## 2022-08-25 LAB — BASIC METABOLIC PANEL
BUN: 13 mg/dL (ref 6–23)
CO2: 30 mEq/L (ref 19–32)
Calcium: 10.4 mg/dL (ref 8.4–10.5)
Chloride: 102 mEq/L (ref 96–112)
Creatinine, Ser: 0.87 mg/dL (ref 0.40–1.20)
GFR: 67.11 mL/min (ref >60.00)
Glucose, Bld: 84 mg/dL (ref 70–99)
Potassium: 4.4 mEq/L (ref 3.5–5.1)
Sodium: 138 mEq/L (ref 135–145)

## 2022-08-29 ENCOUNTER — Encounter: Payer: PPO | Admitting: Internal Medicine

## 2022-08-29 NOTE — Assessment & Plan Note (Deleted)
Physical today 08/29/22.  Colonoscopy 05/2016 - inflammation.  Recommended f/u colonoscopy in 10 years.  Mammogram 06/08/22 - Birads I.

## 2022-08-29 NOTE — Progress Notes (Deleted)
Subjective:    [name], female    DOB: May 08, 1951, 71 y.o.   MRN: [id number]  Patient here for No chief complaint on file.   HPI Here for her physical exam. Just saw Dr Nehemiah Massed 08/17/22.  Stable.  Saw Dr Roland Rack 06/20/22 - right shoulder pain - impingement syndrome of right shoulder.  S/p injection.  Referred to PT.    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)   Heart murmur    History of chicken pox    Hyperlipidemia    Hypertension    Insomnia    Past Surgical History:  Procedure Laterality Date   APPENDECTOMY     BREAST EXCISIONAL BIOPSY Right    negative over 5 years ago- neg   BREAST SURGERY     Biopsy   COLONOSCOPY WITH PROPOFOL N/A 05/16/2016   Procedure: COLONOSCOPY WITH PROPOFOL;  Surgeon: [name], MD;  Location: [facility];  Service: Endoscopy;  Laterality: N/A;   CORONARY ARTERY BYPASS GRAFT  05/01/2014   3 vessel, [facility]   ESOPHAGOGASTRODUODENOSCOPY (EGD) WITH PROPOFOL N/A 02/23/2017   Procedure: ESOPHAGOGASTRODUODENOSCOPY (EGD) WITH PROPOFOL;  Surgeon: [name], MD;  Location: [place];  Service: Endoscopy;  Laterality: N/A;   HYSTEROSCOPY WITH D & C     lipoma removed     removed from forehead   triple bypass     Family History  Problem Relation Age of Onset   Breast cancer Other        maternal great aunt   Heart disease Other        multiple family members   Heart disease Mother    Stroke Mother    Alzheimer's disease Mother    Heart disease Father    Stroke Father    Breast cancer Sister 26   Alzheimer's disease Maternal Aunt    Alzheimer's disease Maternal Uncle    Alzheimer's disease Maternal Grandmother    Colon cancer Neg Hx    Diabetes Neg Hx    Ovarian cancer Neg Hx    Social History   Socioeconomic History   Marital status: Married    Spouse name: Not on file   Number of children: Not on file    Years of education: Not on file   Highest education level: Not on file  Occupational History   Not on file  Tobacco Use   Smoking status: Never   Smokeless tobacco: Never  Vaping Use   Vaping Use: Never used  Substance and Sexual Activity   Alcohol use: Never    Alcohol/week: 0.0 standard drinks of alcohol   Drug use: No   Sexual activity: Yes    Birth control/protection: Post-menopausal  Other Topics Concern   Not on file  Social History Narrative   Not on file   Social Determinants of Health   Financial Resource Strain: Low Risk  (09/20/2021)   Overall Financial Resource Strain (CARDIA)    Difficulty of Paying Living Expenses: Not hard at all  Food Insecurity: No Food Insecurity (09/20/2021)   Hunger Vital Sign    Worried About Running Out of Food in the Last Year: Never true    Ran Out of Food in the Last Year: Never true  Transportation Needs: No Transportation Needs (09/20/2021)   PRAPARE - Hydrologist (Medical): No  Lack of Transportation (Non-Medical): No  Physical Activity: Sufficiently Active (09/20/2021)   Exercise Vital Sign    Days of Exercise per Week: 3 days    Minutes of Exercise per Session: 50 min  Stress: No Stress Concern Present (09/20/2021)   [place]    Feeling of Stress : Not at all  Social Connections: Unknown (09/20/2021)   Social Connection and Isolation Panel [NHANES]    Frequency of Communication with Friends and Family: More than three times a week    Frequency of Social Gatherings with Friends and Family: More than three times a week    Attends Religious Services: More than 4 times per year    Active Member of [employer] or Organizations: Yes    Attends Music therapist: More than 4 times per year    Marital Status: Not on file     Review of Systems     Objective:     There were no vitals taken for this visit. Wt Readings from  Last 3 Encounters:  06/05/22 172 lb 6.4 oz (78.2 kg)  03/02/22 171 lb 6.4 oz (77.7 kg)  11/01/21 171 lb 9.6 oz (77.8 kg)    Physical Exam   Outpatient Encounter Medications as of 08/29/2022  Medication Sig   Alpha-Lipoic Acid 100 MG CAPS Take 600 mg by mouth daily.   amLODipine (NORVASC) 10 MG tablet Take 10 mg by mouth daily.   ascorbic acid (VITAMIN C) 1000 MG tablet Take by mouth.   aspirin EC 81 MG tablet Take 81 mg by mouth daily.   atorvastatin (LIPITOR) 40 MG tablet Take by mouth.   calcium-vitamin D (OSCAL WITH D) 500-200 MG-UNIT TABS tablet Take by mouth.   carvedilol (COREG) 6.25 MG tablet Take 6.25 mg by mouth 2 (two) times daily.   cyanocobalamin 1000 MCG tablet Take 1,000 mcg by mouth daily.   Docusate Calcium (STOOL SOFTENER PO) Take by mouth.   hydrochlorothiazide (MICROZIDE) 12.5 MG capsule Take 1 capsule (12.5 mg total) by mouth daily.   irbesartan (AVAPRO) 300 MG tablet TAKE 1 TABLET BY MOUTH EVERY DAY   Omega-3 Fatty Acids (FISH OIL) 1000 MG CAPS Take 1,200 mg by mouth daily.   pantoprazole (PROTONIX) 40 MG tablet TAKE 1 TABLET (40 MG TOTAL) BY MOUTH DAILY TAKE 30 MINUTES BEFORE BREAKFAST   pregabalin (LYRICA) 75 MG capsule Take 1 capsule (75 mg total) by mouth 2 (two) times daily.   Probiotic Product (PROBIOTIC PO) Take by mouth.   spironolactone (ALDACTONE) 25 MG tablet Take 25 mg by mouth daily.   traZODone (DESYREL) 50 MG tablet TAKE 1-2 TABLETS (50-100 MG TOTAL) BY MOUTH AT BEDTIME AS NEEDED.   No facility-administered encounter medications on file as of 08/29/2022.     Lab Results  Component Value Date   WBC 6.1 11/21/2021   HGB 12.9 11/21/2021   HCT 38.3 11/21/2021   PLT 186.0 11/21/2021   GLUCOSE 84 08/25/2022   CHOL 100 08/25/2022   TRIG 87.0 08/25/2022   HDL 37.30 (L) 08/25/2022   LDLCALC 45 08/25/2022   ALT 19 08/25/2022   AST 17 08/25/2022   NA 138 08/25/2022   K 4.4 08/25/2022   CL 102 08/25/2022   CREATININE 0.87 08/25/2022   BUN 13  08/25/2022   CO2 30 08/25/2022   TSH 4.01 04/13/2022    MM 3D SCREEN BREAST BILATERAL  Result Date: 06/08/2022 CLINICAL DATA:  Screening. EXAM: DIGITAL SCREENING BILATERAL MAMMOGRAM WITH  TOMOSYNTHESIS AND CAD TECHNIQUE: Bilateral screening digital craniocaudal and mediolateral oblique mammograms were obtained. Bilateral screening digital breast tomosynthesis was performed. The images were evaluated with computer-aided detection. COMPARISON:  Previous exam(s). ACR Breast Density Category b: There are scattered areas of fibroglandular density. FINDINGS: There are no findings suspicious for malignancy. IMPRESSION: No mammographic evidence of malignancy. A result letter of this screening mammogram will be mailed directly to the patient. RECOMMENDATION: Screening mammogram in one year. (Code:[id number]-B-01Y) BI-RADS CATEGORY  1: Negative. Electronically Signed   By: Audie Pinto M.D.   On: 06/08/2022 10:05       Assessment & Plan:  There are no diagnoses linked to this encounter.   Einar Pheasant, MD

## 2022-08-31 ENCOUNTER — Other Ambulatory Visit: Payer: Self-pay | Admitting: Internal Medicine

## 2022-09-12 ENCOUNTER — Other Ambulatory Visit: Payer: Self-pay | Admitting: Internal Medicine

## 2022-09-12 NOTE — Telephone Encounter (Signed)
Refilled: 06/26/2022 Last OV: 06/05/2022 Next OV: 10/06/2022

## 2022-09-12 NOTE — Telephone Encounter (Signed)
Rx sent in for lyria #60 with 2 refills.  PDMP reviewed.

## 2022-09-21 ENCOUNTER — Other Ambulatory Visit: Payer: Self-pay

## 2022-09-21 ENCOUNTER — Encounter: Payer: Self-pay | Admitting: Internal Medicine

## 2022-09-21 MED ORDER — PREGABALIN 75 MG PO CAPS: 75.0000 mg | ORAL_CAPSULE | Freq: Two times a day (BID) | ORAL | 2 refills | Status: DC

## 2022-09-21 NOTE — Telephone Encounter (Signed)
Rx sent to dr scott for approval

## 2022-09-24 ENCOUNTER — Other Ambulatory Visit: Payer: Self-pay | Admitting: Family

## 2022-09-24 ENCOUNTER — Other Ambulatory Visit: Payer: Self-pay | Admitting: Internal Medicine

## 2022-10-05 ENCOUNTER — Ambulatory Visit (INDEPENDENT_AMBULATORY_CARE_PROVIDER_SITE_OTHER): Payer: PPO

## 2022-10-05 VITALS — Ht 69.0 in | Wt 172.0 lb

## 2022-10-05 DIAGNOSIS — Z0000 Encounter for general adult medical examination without abnormal findings: Secondary | ICD-10-CM | POA: Diagnosis not present

## 2022-10-05 NOTE — Progress Notes (Signed)
Subjective:   Alexandra Mendoza is a 72 y.o. female who presents for Medicare Annual (Subsequent) preventive examination.  Review of Systems    No ROS.  Medicare Wellness Virtual Visit.  Visual/audio telehealth visit, UTA vital signs.   See social history for additional risk factors.   Cardiac Risk Factors include: advanced age (>81mn, >>27women)     Objective:    Today's Vitals   10/05/22 0927  Weight: 172 lb (78 kg)  Height: '5\' 9"'$  (1.753 m)   Body mass index is 25.4 kg/m.     10/05/2022    9:19 AM 09/20/2021    8:44 AM 10/08/2020    7:11 PM 06/24/2020    8:59 AM 11/26/2019    8:51 PM 06/24/2019    8:47 AM 05/14/2018    8:50 AM  Advanced Directives  Does Patient Have a Medical Advance Directive? Yes Yes No Yes No Yes Yes  Type of AParamedicof [place]are[place]Living will [place]Living will  [place]Living will  [place] PointLiving will [place]Living will  Does patient want to make changes to medical advance directive? No - Patient declined No - Patient declined  No - Patient declined  No - Patient declined No - Patient declined  Copy of HBassettin Chart? No - copy requested No - copy requested  No - copy requested  No - copy requested No - copy requested  Would patient like information on creating a medical advance directive?     No - Patient declined      Current Medications (verified) Outpatient Encounter Medications as of 10/05/2022  Medication Sig   Alpha-Lipoic Acid 100 MG CAPS Take 600 mg by mouth daily.   amLODipine (NORVASC) 10 MG tablet Take 10 mg by mouth daily.   ascorbic acid (VITAMIN C) 1000 MG tablet Take by mouth.   aspirin EC 81 MG tablet Take 81 mg by mouth daily.   atorvastatin (LIPITOR) 40 MG tablet Take by mouth.   calcium-vitamin D (OSCAL WITH D) 500-200 MG-UNIT TABS tablet Take by mouth.   carvedilol (COREG) 6.25 MG tablet Take 6.25  mg by mouth 2 (two) times daily.   cyanocobalamin 1000 MCG tablet Take 1,000 mcg by mouth daily.   Docusate Calcium (STOOL SOFTENER PO) Take by mouth.   hydrochlorothiazide (MICROZIDE) 12.5 MG capsule TAKE 1 CAPSULE BY MOUTH EVERY DAY   irbesartan (AVAPRO) 300 MG tablet TAKE 1 TABLET BY MOUTH EVERY DAY   Omega-3 Fatty Acids (FISH OIL) 1000 MG CAPS Take 1,200 mg by mouth daily.   pantoprazole (PROTONIX) 40 MG tablet TAKE 1 TABLET (40 MG TOTAL) BY MOUTH DAILY TAKE 30 MINUTES BEFORE BREAKFAST   pregabalin (LYRICA) 75 MG capsule Take 1 capsule (75 mg total) by mouth 2 (two) times daily.   Probiotic Product (PROBIOTIC PO) Take by mouth.   spironolactone (ALDACTONE) 25 MG tablet Take 25 mg by mouth daily.   traZODone (DESYREL) 50 MG tablet TAKE 1-2 TABLETS (50-100 MG TOTAL) BY MOUTH AT BEDTIME AS NEEDED.   No facility-administered encounter medications on file as of 10/05/2022.    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)   Heart murmur    History of chicken pox    Hyperlipidemia    Hypertension  Insomnia    Past Surgical History:  Procedure Laterality Date   APPENDECTOMY     BREAST EXCISIONAL BIOPSY Right    negative over 5 years ago- neg   BREAST SURGERY     Biopsy   COLONOSCOPY WITH PROPOFOL N/A 05/16/2016   Procedure: COLONOSCOPY WITH PROPOFOL;  Surgeon: [name], MD;  Location: [facility] ENDOSCOPY;  Service: Endoscopy;  Laterality: N/A;   CORONARY ARTERY BYPASS GRAFT  05/01/2014   3 vessel, [facility]   ESOPHAGOGASTRODUODENOSCOPY (EGD) WITH PROPOFOL N/A 02/23/2017   Procedure: ESOPHAGOGASTRODUODENOSCOPY (EGD) WITH PROPOFOL;  Surgeon: [name], MD;  Location: [facility];  Service: Endoscopy;  Laterality: N/A;   HYSTEROSCOPY WITH D & C     lipoma removed     removed from forehead   triple bypass     Family  History  Problem Relation Age of Onset   Breast cancer Other        maternal great aunt   Heart disease Other        multiple family members   Heart disease Mother    Stroke Mother    Alzheimer's disease Mother    Heart disease Father    Stroke Father    Breast cancer Sister 35   Alzheimer's disease Maternal Aunt    Alzheimer's disease Maternal Uncle    Alzheimer's disease Maternal Grandmother    Colon cancer Neg Hx    Diabetes Neg Hx    Ovarian cancer Neg Hx    Social History   Socioeconomic History   Marital status: Married    Spouse name: Not on file   Number of children: Not on file   Years of education: Not on file   Highest education level: Not on file  Occupational History   Not on file  Tobacco Use   Smoking status: Never   Smokeless tobacco: Never  Vaping Use   Vaping Use: Never used  Substance and Sexual Activity   Alcohol use: Never    Alcohol/week: 0.0 standard drinks of alcohol   Drug use: No   Sexual activity: Yes    Birth control/protection: Post-menopausal  Other Topics Concern   Not on file  Social History Narrative   Not on file   Social Determinants of Health   Financial Resource Strain: Low Risk  (10/05/2022)   Overall Financial Resource Strain (CARDIA)    Difficulty of Paying Living Expenses: Not hard at all  Food Insecurity: No Food Insecurity (10/05/2022)   Hunger Vital Sign    Worried About Running Out of Food in the Last Year: Never true    Ran Out of Food in the Last Year: Never true  Transportation Needs: No Transportation Needs (10/05/2022)   PRAPARE - Hydrologist (Medical): No    Lack of Transportation (Non-Medical): No  Physical Activity: Sufficiently Active (10/05/2022)   Exercise Vital Sign    Days of Exercise per Week: 3 days    Minutes of Exercise per Session: 50 min  Stress: No Stress Concern Present (10/05/2022)   [place]     Feeling of Stress : Not at all  Social Connections: Unknown (10/05/2022)   Social Connection and Isolation Panel [NHANES]    Frequency of Communication with Friends and Family: More than three times a week    Frequency of Social Gatherings with Friends and Family: More than three times a week    Attends Religious Services: More  than 4 times per year    Active Member of Clubs or Organizations: Yes    Attends Archivist Meetings: More than 4 times per year    Marital Status: Not on file    Tobacco Counseling Counseling given: Not Answered   Clinical Intake:  Pre-visit preparation completed: Yes        Diabetes: No  How often do you need to have someone help you when you read instructions, pamphlets, or other written materials from your doctor or pharmacy?: 1 - Never    Interpreter Needed?: No      Activities of Daily Living    10/05/2022    9:21 AM  In your present state of health, do you have any difficulty performing the following activities:  Hearing? 0  Vision? 0  Difficulty concentrating or making decisions? 0  Walking or climbing stairs? 0  Dressing or bathing? 0  Doing errands, shopping? 0  Preparing Food and eating ? N  Using the Toilet? N  In the past six months, have you accidently leaked urine? N  Do you have problems with loss of bowel control? N  Managing your Medications? N  Managing your Finances? N  Housekeeping or managing your Housekeeping? N    Patient Care Team: Einar Pheasant, MD as PCP - General (Internal Medicine)  Indicate any recent Medical Services you may have received from other than [facility] providers in the past year (date may be approximate).     Assessment:   This is a routine wellness examination for [place].  I connected with  Jennette Kettle on 10/05/22 by a audio enabled telemedicine application and verified that I am speaking with the correct person using two identifiers.  Patient Location: Home  Provider  Location: Office/Clinic  I discussed the limitations of evaluation and management by telemedicine. The patient expressed understanding and agreed to proceed.   Hearing/Vision screen Hearing Screening - Comments:: Patient is able to hear conversational tones without difficulty. No issues reported. Vision Screening - Comments:: Followed by [facility] Wears reading glasses Annual exam They have regular follow up with the ophthalmologist   Dietary issues and exercise activities discussed: Current Exercise Habits: Home exercise routine, Intensity: Mild   Goals Addressed             This Visit's Progress    Follow up with Primary Care Provider       As needed      Maintain healthy lifestyle       Stay active Healthy diet Good water intake       Depression Screen    10/05/2022    9:20 AM 06/05/2022   10:16 AM 03/02/2022   10:23 AM 09/20/2021    8:42 AM 06/24/2020    8:51 AM 06/24/2019    8:48 AM 03/10/2019    9:42 AM  PHQ 2/9 Scores  PHQ - 2 Score 0 0 0 0 0 0 0    Fall Risk    10/05/2022    9:21 AM 06/05/2022   10:16 AM 09/20/2021    8:45 AM 10/11/2020   12:16 PM 06/24/2020    9:00 AM  Fall Risk   Falls in the past year? 0 0 0 0 0  Number falls in past yr: 0  0 0 0  Injury with Fall? 0  0 0   Risk for fall due to :  No Fall Risks     Follow up Falls evaluation completed;Falls prevention discussed Falls evaluation  completed Falls evaluation completed Falls evaluation completed Falls evaluation completed    FALL RISK PREVENTION PERTAINING TO THE HOME: Home free of loose throw rugs in walkways, pet beds, electrical cords, etc? Yes  Adequate lighting in your home to reduce risk of falls? Yes   ASSISTIVE DEVICES UTILIZED TO PREVENT FALLS: Life alert? No  Use of a cane, walker or w/c? No   TIMED UP AND GO: Was the test performed? No .   Cognitive Function:    05/14/2018    9:03 AM  MMSE - Mini Mental State Exam  Orientation to time 5  Orientation to  Place 5  Registration 3  Attention/ Calculation 5  Recall 3  Language- name 2 objects 2  Language- repeat 1  Language- follow 3 step command 3  Language- read & follow direction 1  Write a sentence 1  Copy design 1  Total score 30        10/05/2022    9:22 AM 06/24/2019    8:56 AM  6CIT Screen  What Year? 0 points 0 points  What month? 0 points 0 points  What time? 0 points 0 points  Count back from 20 0 points 0 points  Months in reverse 0 points 0 points  Repeat phrase 0 points 0 points  Total Score 0 points 0 points    Immunizations Immunization History  Administered Date(s) Administered   Fluad Quad(high Dose 65+) 06/24/2019, 07/01/2020, 06/29/2021, 06/25/2022   Influenza, High Dose Seasonal PF 06/22/2016, 06/25/2017, 06/21/2018   Influenza,inj,Quad PF,6+ Mos 07/02/2014, 06/22/2015   Influenza-Unspecified 07/02/2014   PFIZER(Purple Top)SARS-COV-2 Vaccination 09/29/2019, 10/20/2019, 09/13/2020   Pneumococcal Conjugate-13 08/29/2016   Pneumococcal Polysaccharide-23 07/02/2014, 07/13/2020   Tdap 08/16/2015   Zoster Recombinat (Shingrix) 09/24/2017, 01/01/2018   Zoster, Live 04/06/2012, 09/11/2013   Screening Tests Health Maintenance  Topic Date Due   COVID-19 Vaccine (4 - 2023-24 season) 10/21/2022 (Originally 05/12/2022)   MAMMOGRAM  06/09/2023   Medicare Annual Wellness (AWV)  10/06/2023   DTaP/Tdap/Td (2 - Td or Tdap) 08/15/2025   COLONOSCOPY (Pts 45-74yr Insurance coverage will need to be confirmed)  05/16/2026   Pneumonia Vaccine 72 Years old  Completed   INFLUENZA VACCINE  Completed   DEXA SCAN  Completed   Hepatitis C Screening  Completed   Zoster Vaccines- Shingrix  Completed   HPV VACCINES  Aged Out   Health Maintenance There are no preventive care reminders to display for this patient.  Lung Cancer Screening: (Low Dose CT Chest recommended if Age 72-80years, 30 pack-year currently smoking OR have quit w/in 15years.) does not qualify.   Hepatitis  C Screening: Completed 11/2021.  Vision Screening: Recommended annual ophthalmology exams for early detection of glaucoma and other disorders of the eye.  Dental Screening: Recommended annual dental exams for proper oral hygiene.  Community Resource Referral / Chronic Care Management: CRR required this visit?  No   CCM required this visit?  No      Plan:     I have personally reviewed and noted the following in the patient's chart:   Medical and social history Use of alcohol, tobacco or illicit drugs  Current medications and supplements including opioid prescriptions. Patient is not currently taking opioid prescriptions. Functional ability and status Nutritional status Physical activity Advanced directives List of other physicians Hospitalizations, surgeries, and ER visits in previous 12 months Vitals Screenings to include cognitive, depression, and falls Referrals and appointments  In addition, I have reviewed and discussed with patient certain  preventive protocols, quality metrics, and best practice recommendations. A written personalized care plan for preventive services as well as general preventive health recommendations were provided to patient.     Leta Jungling, LPN   [id number]/94/[id number]

## 2022-10-05 NOTE — Patient Instructions (Addendum)
[name] , Thank you for taking time to come for your Medicare Wellness Visit. I appreciate your ongoing commitment to your health goals. Please review the following plan we discussed and let me know if I can assist you in the future.   These are the goals we discussed:  Goals Addressed             This Visit's Progress    Follow up with Primary Care Provider       As needed      Maintain healthy lifestyle       Stay active Healthy diet Good water intake         This is a list of the screening recommended for you and due dates:  Health Maintenance  Topic Date Due   COVID-19 Vaccine (4 - 2023-24 season) 10/21/2022*   Mammogram  06/09/2023   Medicare Annual Wellness Visit  10/06/2023   DTaP/Tdap/Td vaccine (2 - Td or Tdap) 08/15/2025   Colon Cancer Screening  05/16/2026   Pneumonia Vaccine  Completed   Flu Shot  Completed   DEXA scan (bone density measurement)  Completed   Hepatitis C Screening: USPSTF Recommendation to screen - Ages 7-79 yo.  Completed   Zoster (Shingles) Vaccine  Completed   HPV Vaccine  Aged Out  *Topic was postponed. The date shown is not the original due date.    Advanced directives: End of life planning; Advance aging; Advanced directives discussed.  Copy of current HCPOA/Living Will requested.    Conditions/risks identified: none new  Next appointment: Follow up in one year for your annual wellness visit    Preventive Care 65 Years and Older, Female Preventive care refers to lifestyle choices and visits with your health care provider that can promote health and wellness. What does preventive care include? A yearly physical exam. This is also called an annual well check. Dental exams once or twice a year. Routine eye exams. Ask your health care provider how often you should have your eyes checked. Personal lifestyle choices, including: Daily care of your teeth and gums. Regular physical activity. Eating a healthy diet. Avoiding  tobacco and drug use. Limiting alcohol use. Practicing safe sex. Taking low-dose aspirin every day. Taking vitamin and mineral supplements as recommended by your health care provider. What happens during an annual well check? The services and screenings done by your health care provider during your annual well check will depend on your age, overall health, lifestyle risk factors, and family history of disease. Counseling  Your health care provider may ask you questions about your: Alcohol use. Tobacco use. Drug use. Emotional well-being. Home and relationship well-being. Sexual activity. Eating habits. History of falls. Memory and ability to understand (cognition). Work and work Statistician. Reproductive health. Screening  You may have the following tests or measurements: Height, weight, and BMI. Blood pressure. Lipid and cholesterol levels. These may be checked every 5 years, or more frequently if you are over 73 years old. Skin check. Lung cancer screening. You may have this screening every year starting at age 15 if you have a 30-pack-year history of smoking and currently smoke or have quit within the past 15 years. Fecal occult blood test (FOBT) of the stool. You may have this test every year starting at age 72. Flexible sigmoidoscopy or colonoscopy. You may have a sigmoidoscopy every 5 years or a colonoscopy every 10 years starting at age 42. Hepatitis C blood test. Hepatitis B blood test. Sexually transmitted disease (STD) testing.  Diabetes screening. This is done by checking your blood sugar (glucose) after you have not eaten for a while (fasting). You may have this done every 1-3 years. Bone density scan. This is done to screen for osteoporosis. You may have this done starting at age 68. Mammogram. This may be done every 1-2 years. Talk to your health care provider about how often you should have regular mammograms. Talk with your health care provider about your test  results, treatment options, and if necessary, the need for more tests. Vaccines  Your health care provider may recommend certain vaccines, such as: Influenza vaccine. This is recommended every year. Tetanus, diphtheria, and acellular pertussis (Tdap, Td) vaccine. You may need a Td booster every 10 years. Zoster vaccine. You may need this after age 67. Pneumococcal 13-valent conjugate (PCV13) vaccine. One dose is recommended after age 71. Pneumococcal polysaccharide (PPSV23) vaccine. One dose is recommended after age 5. Talk to your health care provider about which screenings and vaccines you need and how often you need them. This information is not intended to replace advice given to you by your health care provider. Make sure you discuss any questions you have with your health care provider. Document Released: 09/24/2015 Document Revised: 05/17/2016 Document Reviewed: 06/29/2015 Elsevier Interactive Patient Education  2017 [place] Prevention in the Home Falls can cause injuries. They can happen to people of all ages. There are many things you can do to make your home safe and to help prevent falls. What can I do on the outside of my home? Regularly fix the edges of walkways and driveways and fix any cracks. Remove anything that might make you trip as you walk through a door, such as a raised step or threshold. Trim any bushes or trees on the path to your home. Use bright outdoor lighting. Clear any walking paths of anything that might make someone trip, such as rocks or tools. Regularly check to see if handrails are loose or broken. Make sure that both sides of any steps have handrails. Any raised decks and porches should have guardrails on the edges. Have any leaves, snow, or ice cleared regularly. Use sand or salt on walking paths during winter. Clean up any spills in your garage right away. This includes oil or grease spills. What can I do in the bathroom? Use night  lights. Install grab bars by the toilet and in the tub and shower. Do not use towel bars as grab bars. Use non-skid mats or decals in the tub or shower. If you need to sit down in the shower, use a plastic, non-slip stool. Keep the floor dry. Clean up any water that spills on the floor as soon as it happens. Remove soap buildup in the tub or shower regularly. Attach bath mats securely with double-sided non-slip rug tape. Do not have throw rugs and other things on the floor that can make you trip. What can I do in the bedroom? Use night lights. Make sure that you have a light by your bed that is easy to reach. Do not use any sheets or blankets that are too big for your bed. They should not hang down onto the floor. Have a firm chair that has side arms. You can use this for support while you get dressed. Do not have throw rugs and other things on the floor that can make you trip. What can I do in the kitchen? Clean up any spills right away. Avoid walking on wet floors.  Keep items that you use a lot in easy-to-reach places. If you need to reach something above you, use a strong step stool that has a grab bar. Keep electrical cords out of the way. Do not use floor polish or wax that makes floors slippery. If you must use wax, use non-skid floor wax. Do not have throw rugs and other things on the floor that can make you trip. What can I do with my stairs? Do not leave any items on the stairs. Make sure that there are handrails on both sides of the stairs and use them. Fix handrails that are broken or loose. Make sure that handrails are as long as the stairways. Check any carpeting to make sure that it is firmly attached to the stairs. Fix any carpet that is loose or worn. Avoid having throw rugs at the top or bottom of the stairs. If you do have throw rugs, attach them to the floor with carpet tape. Make sure that you have a light switch at the top of the stairs and the bottom of the stairs. If  you do not have them, ask someone to add them for you. What else can I do to help prevent falls? Wear shoes that: Do not have high heels. Have rubber bottoms. Are comfortable and fit you well. Are closed at the toe. Do not wear sandals. If you use a stepladder: Make sure that it is fully opened. Do not climb a closed stepladder. Make sure that both sides of the stepladder are locked into place. Ask someone to hold it for you, if possible. Clearly mark and make sure that you can see: Any grab bars or handrails. First and last steps. Where the edge of each step is. Use tools that help you move around (mobility aids) if they are needed. These include: Canes. Walkers. Scooters. Crutches. Turn on the lights when you go into a dark area. Replace any light bulbs as soon as they burn out. Set up your furniture so you have a clear path. Avoid moving your furniture around. If any of your floors are uneven, fix them. If there are any pets around you, be aware of where they are. Review your medicines with your doctor. Some medicines can make you feel dizzy. This can increase your chance of falling. Ask your doctor what other things that you can do to help prevent falls. This information is not intended to replace advice given to you by your health care provider. Make sure you discuss any questions you have with your health care provider. Document Released: 06/24/2009 Document Revised: 02/03/2016 Document Reviewed: 10/02/2014 Elsevier Interactive Patient Education  2017 [employer].

## 2022-10-06 ENCOUNTER — Ambulatory Visit (INDEPENDENT_AMBULATORY_CARE_PROVIDER_SITE_OTHER): Payer: PPO | Admitting: Internal Medicine

## 2022-10-06 ENCOUNTER — Encounter: Payer: Self-pay | Admitting: Internal Medicine

## 2022-10-06 ENCOUNTER — Other Ambulatory Visit (HOSPITAL_COMMUNITY)
Admission: RE | Admit: 2022-10-06 | Discharge: 2022-10-06 | Disposition: A | Discharge status: Home / Self Care | Payer: PPO | Source: Ambulatory Visit | Attending: Internal Medicine | Admitting: Internal Medicine

## 2022-10-06 VITALS — BP 128/78 | HR 69 | Temp 97.8°F | Resp 16 | Ht 69.0 in | Wt 173.0 lb

## 2022-10-06 DIAGNOSIS — I10 Essential (primary) hypertension: Secondary | ICD-10-CM

## 2022-10-06 DIAGNOSIS — I700 Atherosclerosis of aorta: Secondary | ICD-10-CM | POA: Diagnosis not present

## 2022-10-06 DIAGNOSIS — D696 Thrombocytopenia, unspecified: Secondary | ICD-10-CM | POA: Diagnosis not present

## 2022-10-06 DIAGNOSIS — Z1151 Encounter for screening for human papillomavirus (HPV): Secondary | ICD-10-CM | POA: Diagnosis not present

## 2022-10-06 DIAGNOSIS — K2100 Gastro-esophageal reflux disease with esophagitis, without bleeding: Secondary | ICD-10-CM | POA: Diagnosis not present

## 2022-10-06 DIAGNOSIS — E7800 Pure hypercholesterolemia, unspecified: Secondary | ICD-10-CM | POA: Diagnosis not present

## 2022-10-06 DIAGNOSIS — I2510 Atherosclerotic heart disease of native coronary artery without angina pectoris: Secondary | ICD-10-CM | POA: Diagnosis not present

## 2022-10-06 DIAGNOSIS — Z124 Encounter for screening for malignant neoplasm of cervix: Secondary | ICD-10-CM | POA: Insufficient documentation

## 2022-10-06 DIAGNOSIS — Z0000 Encounter for general adult medical examination without abnormal findings: Secondary | ICD-10-CM | POA: Diagnosis not present

## 2022-10-06 DIAGNOSIS — D3502 Benign neoplasm of left adrenal gland: Secondary | ICD-10-CM | POA: Diagnosis not present

## 2022-10-06 DIAGNOSIS — F419 Anxiety disorder, unspecified: Secondary | ICD-10-CM

## 2022-10-06 NOTE — Assessment & Plan Note (Signed)
Physical today 10/06/22.  Colonoscopy 05/2016 - inflammation.  Recommended f/u colonoscopy in 10 years.  PAP 08/24/21 - Dr Kenton Kingfisher.  Mammogram 06/08/22 - Birads I.

## 2022-10-06 NOTE — Progress Notes (Signed)
Subjective:    [name], female    DOB: 07-Dec-1950, 72 y.o.   MRN: [id number]  Patient here for  Chief Complaint  Patient presents with   Annual Exam    HPI Here for her physical exam.  Saw Dr Nehemiah Massed - 08/17/22 - stable.   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)   Heart murmur    History of chicken pox    Hyperlipidemia    Hypertension    Insomnia    Past Surgical History:  Procedure Laterality Date   APPENDECTOMY     BREAST EXCISIONAL BIOPSY Right    negative over 5 years ago- neg   BREAST SURGERY     Biopsy   COLONOSCOPY WITH PROPOFOL N/A 05/16/2016   Procedure: COLONOSCOPY WITH PROPOFOL;  Surgeon: [name], MD;  Location: [facility] ENDOSCOPY;  Service: Endoscopy;  Laterality: N/A;   CORONARY ARTERY BYPASS GRAFT  05/01/2014   3 vessel, [facility]   ESOPHAGOGASTRODUODENOSCOPY (EGD) WITH PROPOFOL N/A 02/23/2017   Procedure: ESOPHAGOGASTRODUODENOSCOPY (EGD) WITH PROPOFOL;  Surgeon: [name], MD;  Location: [place];  Service: Endoscopy;  Laterality: N/A;   HYSTEROSCOPY WITH D & C     lipoma removed     removed from forehead   triple bypass     Family History  Problem Relation Age of Onset   Breast cancer Other        maternal great aunt   Heart disease Other        multiple family members   Heart disease Mother    Stroke Mother    Alzheimer's disease Mother    Heart disease Father    Stroke Father    Breast cancer Sister 75   Alzheimer's disease Maternal Aunt    Alzheimer's disease Maternal Uncle    Alzheimer's disease Maternal Grandmother    Colon cancer Neg Hx    Diabetes Neg Hx    Ovarian cancer Neg Hx    Social History   Socioeconomic History   Marital status: Married    Spouse name: Not on file   Number of children: Not on file   Years of education: Not on file   Highest education level: Not on file  Occupational  History   Not on file  Tobacco Use   Smoking status: Never   Smokeless tobacco: Never  Vaping Use   Vaping Use: Never used  Substance and Sexual Activity   Alcohol use: Never    Alcohol/week: 0.0 standard drinks of alcohol   Drug use: No   Sexual activity: Yes    Birth control/protection: Post-menopausal  Other Topics Concern   Not on file  Social History Narrative   Not on file   Social Determinants of Health   Financial Resource Strain: Low Risk  (10/05/2022)   Overall Financial Resource Strain (CARDIA)    Difficulty of Paying Living Expenses: Not hard at all  Food Insecurity: No Food Insecurity (10/05/2022)   Hunger Vital Sign    Worried About Running Out of Food in the Last Year: Never true    Ran Out of Food in the Last Year: Never true  Transportation Needs: No Transportation Needs (10/05/2022)   PRAPARE - Hydrologist (Medical): No    Lack of Transportation (Non-Medical): No  Physical Activity: Sufficiently Active (10/05/2022)  Exercise Vital Sign    Days of Exercise per Week: 3 days    Minutes of Exercise per Session: 50 min  Stress: No Stress Concern Present (10/05/2022)   [place]    Feeling of Stress : Not at all  Social Connections: Unknown (10/05/2022)   Social Connection and Isolation Panel [NHANES]    Frequency of Communication with Friends and Family: More than three times a week    Frequency of Social Gatherings with Friends and Family: More than three times a week    Attends Religious Services: More than 4 times per year    Active Member of [employer] or Organizations: Yes    Attends Music therapist: More than 4 times per year    Marital Status: Not on file     Review of Systems     Objective:     BP 128/78   Pulse 69   Temp 97.8 F (36.6 C)   Resp 16   Ht '5\' 9"'$  (1.753 m)   Wt 173 lb (78.5 kg)   SpO2 99%   BMI 25.55 kg/m  Wt Readings  from Last 3 Encounters:  10/06/22 173 lb (78.5 kg)  10/05/22 172 lb (78 kg)  06/05/22 172 lb 6.4 oz (78.2 kg)    Physical Exam   Outpatient Encounter Medications as of 10/06/2022  Medication Sig   Alpha-Lipoic Acid 100 MG CAPS Take 600 mg by mouth daily.   amLODipine (NORVASC) 10 MG tablet Take 10 mg by mouth daily.   ascorbic acid (VITAMIN C) 1000 MG tablet Take by mouth.   aspirin EC 81 MG tablet Take 81 mg by mouth daily.   atorvastatin (LIPITOR) 40 MG tablet Take by mouth.   calcium-vitamin D (OSCAL WITH D) 500-200 MG-UNIT TABS tablet Take by mouth.   carvedilol (COREG) 6.25 MG tablet Take 6.25 mg by mouth 2 (two) times daily.   cyanocobalamin 1000 MCG tablet Take 1,000 mcg by mouth daily.   Docusate Calcium (STOOL SOFTENER PO) Take by mouth.   hydrochlorothiazide (MICROZIDE) 12.5 MG capsule TAKE 1 CAPSULE BY MOUTH EVERY DAY   irbesartan (AVAPRO) 300 MG tablet TAKE 1 TABLET BY MOUTH EVERY DAY   Omega-3 Fatty Acids (FISH OIL) 1000 MG CAPS Take 1,200 mg by mouth daily.   pantoprazole (PROTONIX) 40 MG tablet TAKE 1 TABLET (40 MG TOTAL) BY MOUTH DAILY TAKE 30 MINUTES BEFORE BREAKFAST   pregabalin (LYRICA) 75 MG capsule Take 1 capsule (75 mg total) by mouth 2 (two) times daily.   Probiotic Product (PROBIOTIC PO) Take by mouth.   spironolactone (ALDACTONE) 25 MG tablet Take 25 mg by mouth daily.   traZODone (DESYREL) 50 MG tablet TAKE 1-2 TABLETS (50-100 MG TOTAL) BY MOUTH AT BEDTIME AS NEEDED.   No facility-administered encounter medications on file as of 10/06/2022.     Lab Results  Component Value Date   WBC 6.1 11/21/2021   HGB 12.9 11/21/2021   HCT 38.3 11/21/2021   PLT 186.0 11/21/2021   GLUCOSE 84 08/25/2022   CHOL 100 08/25/2022   TRIG 87.0 08/25/2022   HDL 37.30 (L) 08/25/2022   LDLCALC 45 08/25/2022   ALT 19 08/25/2022   AST 17 08/25/2022   NA 138 08/25/2022   K 4.4 08/25/2022   CL 102 08/25/2022   CREATININE 0.87 08/25/2022   BUN 13 08/25/2022   CO2 30  08/25/2022   TSH 4.01 04/13/2022    MM 3D SCREEN BREAST BILATERAL  Result Date: 06/08/2022 CLINICAL DATA:  Screening. EXAM: DIGITAL SCREENING BILATERAL MAMMOGRAM WITH TOMOSYNTHESIS AND CAD TECHNIQUE: Bilateral screening digital craniocaudal and mediolateral oblique mammograms were obtained. Bilateral screening digital breast tomosynthesis was performed. The images were evaluated with computer-aided detection. COMPARISON:  Previous exam(s). ACR Breast Density Category b: There are scattered areas of fibroglandular density. FINDINGS: There are no findings suspicious for malignancy. IMPRESSION: No mammographic evidence of malignancy. A result letter of this screening mammogram will be mailed directly to the patient. RECOMMENDATION: Screening mammogram in one year. (Code:SM-B-01Y) BI-RADS CATEGORY  1: Negative. Electronically Signed   By: Audie Pinto M.D.   On: 06/08/2022 10:05       Assessment & Plan:  Health care maintenance Assessment & Plan: Physical today 10/06/22.  Colonoscopy 05/2016 - inflammation.  Recommended f/u colonoscopy in 10 years.  PAP 08/24/21 - Dr Kenton Kingfisher.  Mammogram 06/08/22 - Birads I.    Hypercholesterolemia -     Lipid panel; Future -     Hepatic function panel; Future  Essential hypertension -     CBC with Differential/Platelet; Future -     Basic metabolic panel; Future  Screening for cervical cancer -     Cytology - PAP     Einar Pheasant, MD

## 2022-10-07 ENCOUNTER — Encounter: Payer: Self-pay | Admitting: Internal Medicine

## 2022-10-07 NOTE — Assessment & Plan Note (Signed)
Currently on coreg, amlodipine, hctz, avapro and aldactone.  Previous abdominal ultrasound - kidneys relatively equal size, etc. Saw nephrology.  No changes made.  Pressure better.  Follow.

## 2022-10-07 NOTE — Assessment & Plan Note (Signed)
No upper symptoms reported.  On protonix.   

## 2022-10-07 NOTE — Assessment & Plan Note (Signed)
Continue lipitor.

## 2022-10-07 NOTE — Assessment & Plan Note (Signed)
Follow cbc.  

## 2022-10-07 NOTE — Assessment & Plan Note (Signed)
Has been evaluated by urology previously.  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  No further w/up warranted.

## 2022-10-07 NOTE — Assessment & Plan Note (Signed)
Was seeing Dr Nehemiah Massed.  Continue risk factor modification.  Stable.  Request to establish care with Dr Rockey Situ - for f/u CAD.

## 2022-10-07 NOTE — Assessment & Plan Note (Signed)
Continue lipitor.  Low cholesterol diet and exercise.  Follow lipid panel and liver function tests.   

## 2022-10-07 NOTE — Assessment & Plan Note (Signed)
Continues trazodone at night.  Overall appears to be doing relatively well.  Follow

## 2022-10-10 LAB — CYTOLOGY - PAP
Comment: NEGATIVE
Diagnosis: NEGATIVE
High risk HPV: NEGATIVE

## 2022-10-18 ENCOUNTER — Encounter: Payer: Self-pay | Admitting: Internal Medicine

## 2022-10-18 NOTE — Telephone Encounter (Signed)
Confirmed with pt this is the same as previous GERD symptoms - pt said yes. Pt advised can take protonix BID Stated will try that and call with update.

## 2022-10-18 NOTE — Telephone Encounter (Signed)
Confirm this feels like her previous issues with acid reflux.  She is on protonix '40mg'$  q day.  Ok to increase to '40mg'$  bid - take 30 minutes before breakfast and 30 minutes before evening meal.  See if symptoms resolve.  Call with update.

## 2022-10-23 DIAGNOSIS — Z951 Presence of aortocoronary bypass graft: Secondary | ICD-10-CM | POA: Diagnosis not present

## 2022-10-23 DIAGNOSIS — I341 Nonrheumatic mitral (valve) prolapse: Secondary | ICD-10-CM | POA: Diagnosis not present

## 2022-10-23 DIAGNOSIS — I10 Essential (primary) hypertension: Secondary | ICD-10-CM | POA: Diagnosis not present

## 2022-10-23 DIAGNOSIS — I25708 Atherosclerosis of coronary artery bypass graft(s), unspecified, with other forms of angina pectoris: Secondary | ICD-10-CM | POA: Diagnosis not present

## 2022-10-23 DIAGNOSIS — I25810 Atherosclerosis of coronary artery bypass graft(s) without angina pectoris: Secondary | ICD-10-CM | POA: Diagnosis not present

## 2022-10-23 DIAGNOSIS — I2089 Other forms of angina pectoris: Secondary | ICD-10-CM | POA: Diagnosis not present

## 2022-10-23 DIAGNOSIS — E782 Mixed hyperlipidemia: Secondary | ICD-10-CM | POA: Diagnosis not present

## 2022-10-26 DIAGNOSIS — I25810 Atherosclerosis of coronary artery bypass graft(s) without angina pectoris: Secondary | ICD-10-CM | POA: Diagnosis not present

## 2022-10-26 DIAGNOSIS — Z951 Presence of aortocoronary bypass graft: Secondary | ICD-10-CM | POA: Diagnosis not present

## 2022-10-26 DIAGNOSIS — I2089 Other forms of angina pectoris: Secondary | ICD-10-CM | POA: Diagnosis not present

## 2022-10-26 DIAGNOSIS — I341 Nonrheumatic mitral (valve) prolapse: Secondary | ICD-10-CM | POA: Diagnosis not present

## 2022-11-07 ENCOUNTER — Ambulatory Visit
Admission: RE | Admit: 2022-11-07 | Discharge: 2022-11-07 | Disposition: A | Discharge status: Home / Self Care | Payer: PPO | Source: Ambulatory Visit | Attending: Internal Medicine | Admitting: Internal Medicine

## 2022-11-07 VITALS — BP 128/63 | HR 63 | Temp 97.8°F | Resp 16

## 2022-11-07 DIAGNOSIS — R6889 Other general symptoms and signs: Secondary | ICD-10-CM

## 2022-11-07 NOTE — ED Triage Notes (Signed)
Patient presents to UC for cough and fever since 4 days. Had a negative covid tests. Treating symptoms with Tylenol. Last known fever Saturday.

## 2022-11-07 NOTE — Discharge Instructions (Addendum)
Follow up here or with your primary care provider if your symptoms are worsening or not improving.    

## 2022-11-07 NOTE — ED Provider Notes (Signed)
[facility]    CSN: [id number] Arrival date & time: 11/07/22  1248      History   Chief Complaint Chief Complaint  Patient presents with   Fever    Cough fever - Entered by patient   Cough    HPI Alexandra Mendoza is a 72 y.o. female.    Fever Associated symptoms: cough   Cough Associated symptoms: fever     Presents to urgent care with complaint of cough and fever x 4 days.  Negative COVID test at home.  Last fever 2 days ago.  Treating self with Tylenol.  Recently returning from a cruise.  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)   Heart murmur    History of chicken pox    Hyperlipidemia    Hypertension    Insomnia     Patient Active Problem List   Diagnosis Date Noted   Aortic atherosclerosis ([place]) 11/01/2021   Near syncope 05/04/2021   Hypokalemia 11/14/2020   Thrombocytopenia ([place]) 11/14/2020   Neck fullness 07/19/2020   Right arm pain 07/19/2020   B12 deficiency 03/21/2020   Rash 11/09/2019   Hypercalcemia 06/29/2019   Neuropathy 02/15/2018   Vaginal atrophy 06/12/2017   Menopause 06/12/2017   Osteopenia 06/12/2017   Heartburn    Chest pain 01/08/2017   Palpitations 11/28/2016   Headache 07/07/2016   Special screening for malignant neoplasms, colon    Benign neoplasm of transverse colon    Family history of breast cancer in first degree relative 10/25/2015   Fibrocystic breast changes of both breasts 10/25/2015   Abdominal discomfort 08/22/2015   Abnormal vaginal Pap smear 05/18/2015   Adaptation reaction 05/05/2015   Anxiety 05/05/2015   History of chicken pox 05/05/2015   HLD (hyperlipidemia) 05/05/2015   Mitral valve disorder 05/05/2015   Osteoarthrosis 05/05/2015   Adiposity 05/05/2015   H/O coronary artery bypass surgery 05/05/2015   CAD (coronary artery disease) 03/14/2015   Essential  hypertension 03/14/2015   Hypercholesterolemia 03/14/2015   Health care maintenance 03/14/2015   Insomnia 03/08/2015   Esophagitis, reflux 12/11/2014   History of atrial fibrillation 06/29/2014   Arteriosclerosis of bypass graft of coronary artery 06/01/2014   Billowing mitral valve 06/01/2014   SCC (squamous cell carcinoma), 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   Adrenal benign neoplasm 12/31/2012    Past Surgical History:  Procedure Laterality Date   APPENDECTOMY     BREAST EXCISIONAL BIOPSY Right    negative over 5 years ago- neg   BREAST SURGERY     Biopsy   COLONOSCOPY WITH PROPOFOL N/A 05/16/2016   Procedure: COLONOSCOPY WITH PROPOFOL;  Surgeon: [name], MD;  Location: [facility];  Service: Endoscopy;  Laterality: N/A;   CORONARY ARTERY BYPASS GRAFT  05/01/2014   3 vessel, [facility]   ESOPHAGOGASTRODUODENOSCOPY (EGD) WITH PROPOFOL N/A 02/23/2017   Procedure: ESOPHAGOGASTRODUODENOSCOPY (EGD) WITH PROPOFOL;  Surgeon: [name], MD;  Location: [place];  Service: Endoscopy;  Laterality: N/A;   HYSTEROSCOPY WITH D & C     lipoma removed     removed from forehead   triple bypass      OB History     Gravida  1   Para  1   Term  1   Preterm      AB      Living  1      SAB      IAB      Ectopic      Multiple      Live Births  1            Home Medications    Prior to Admission medications   Medication Sig Start Date End Date Taking? Authorizing Provider  Alpha-Lipoic Acid 100 MG CAPS Take 600 mg by mouth daily.    [provider]  amLODipine (NORVASC) 10 MG tablet Take 10 mg by mouth daily. 03/08/15   [provider]  ascorbic acid (VITAMIN C) 1000 MG tablet Take by mouth.    [provider]  aspirin EC 81 MG tablet Take 81 mg by mouth daily. 10/04/12   [provider]  atorvastatin (LIPITOR) 40 MG tablet Take by mouth. 03/22/20 03/02/22  [provider]  calcium-vitamin D (OSCAL WITH D) 500-200  MG-UNIT TABS tablet Take by mouth.    [provider]  carvedilol (COREG) 6.25 MG tablet Take 6.25 mg by mouth 2 (two) times daily. 02/11/20   [provider]  cyanocobalamin 1000 MCG tablet Take 1,000 mcg by mouth daily.    [provider]  Docusate Calcium (STOOL SOFTENER PO) Take by mouth.    [provider]  hydrochlorothiazide (MICROZIDE) 12.5 MG capsule TAKE 1 CAPSULE BY MOUTH EVERY DAY 09/25/22   Einar Pheasant, MD  irbesartan (AVAPRO) 300 MG tablet TAKE 1 TABLET BY MOUTH EVERY DAY 02/07/22   Einar Pheasant, MD  Omega-3 Fatty Acids (FISH OIL) 1000 MG CAPS Take 1,200 mg by mouth daily.    [provider]  pantoprazole (PROTONIX) 40 MG tablet TAKE 1 TABLET (40 MG TOTAL) BY MOUTH DAILY TAKE 30 MINUTES BEFORE BREAKFAST 08/31/22   Einar Pheasant, MD  pregabalin (LYRICA) 75 MG capsule Take 1 capsule (75 mg total) by mouth 2 (two) times daily. 09/21/22   Einar Pheasant, MD  Probiotic Product (PROBIOTIC PO) Take by mouth.    [provider]  spironolactone (ALDACTONE) 25 MG tablet Take 25 mg by mouth daily. 11/09/20   [provider]  traZODone (DESYREL) 50 MG tablet TAKE 1-2 TABLETS (50-100 MG TOTAL) BY MOUTH AT BEDTIME AS NEEDED. 04/11/22   Kennyth Arnold, FNP    Family History Family History  Problem Relation Age of Onset   Breast cancer Other        maternal great aunt   Heart disease Other        multiple family members   Heart disease Mother    Stroke Mother    Alzheimer's disease Mother    Heart disease Father    Stroke Father    Breast cancer Sister 79   Alzheimer's disease Maternal Aunt    Alzheimer's disease Maternal Uncle    Alzheimer's disease Maternal Grandmother    Colon cancer Neg Hx    Diabetes Neg Hx    Ovarian cancer Neg Hx     Social History Social History   Tobacco Use   Smoking status: Never   Smokeless tobacco: Never  Vaping Use   Vaping Use: Never used  Substance Use Topics   Alcohol use:  Never    Alcohol/week: 0.0 standard drinks of alcohol   Drug use: No     Allergies   Valsartan and Hydralazine   Review of Systems Review of Systems  Constitutional:  Positive for fever.  Respiratory:  Positive for cough.      Physical Exam Triage  Vital Signs ED Triage Vitals [11/07/22 1308]  Enc Vitals Group     BP      Pulse      Resp      Temp      Temp src      SpO2      Weight      Height      Head Circumference      Peak Flow      Pain Score 0     Pain Loc      Pain Edu?      Excl. in [place]?    No data found.  Updated Vital Signs There were no vitals taken for this visit.  Visual Acuity Right Eye Distance:   Left Eye Distance:   Bilateral Distance:    Right Eye Near:   Left Eye Near:    Bilateral Near:     Physical Exam Vitals reviewed.  Constitutional:      Appearance: Normal appearance.  HENT:     Mouth/Throat:     Mouth: Mucous membranes are moist.     Pharynx: No oropharyngeal exudate or posterior oropharyngeal erythema.  Cardiovascular:     Rate and Rhythm: Normal rate and regular rhythm.     Pulses: Normal pulses.     Heart sounds: Normal heart sounds.  Pulmonary:     Effort: Pulmonary effort is normal.     Breath sounds: Normal breath sounds.  Skin:    General: Skin is warm and dry.  Neurological:     General: No focal deficit present.     Mental Status: She is alert and oriented to person, place, and time.  Psychiatric:        Mood and Affect: Mood normal.        Behavior: Behavior normal.      UC Treatments / Results  Labs (all labs ordered are listed, but only abnormal results are displayed) Labs Reviewed - No data to display  EKG   Radiology No results found.  Procedures Procedures (including critical care time)  Medications Ordered in UC Medications - No data to display  Initial Impression / Assessment and Plan / UC Course  I have reviewed the triage vital signs and the nursing notes.  Pertinent labs &  imaging results that were available during my care of the patient were reviewed by me and considered in my medical decision making (see chart for details).   Patient is afebrile here without recent antipyretics. Satting well on room air. Overall is well appearing, well hydrated, without respiratory distress. Pulmonary exam is unremarkable.  Lungs CTAB without wheezing, rhonchi, rales.  Symptoms are consistent with an acute viral process including influenza or COVID.  Negative COVID test at home and outside of the treatment window for influenza so antiviral therapy was not offered.  Patient's lungs are clear and no red flags.  Recommending use of appropriate OTC medication for symptom control.  Final Clinical Impressions(s) / UC Diagnoses   Final diagnoses:  None   Discharge Instructions   None    ED Prescriptions   None    PDMP not reviewed this encounter.   Rose Phi, [place] 11/07/22 1320

## 2022-11-12 ENCOUNTER — Encounter: Payer: Self-pay | Admitting: Internal Medicine

## 2022-11-13 NOTE — Telephone Encounter (Signed)
See if she can do a virtual visit this week - to discuss treatment.  Confirm no acute issues.

## 2022-11-13 NOTE — Telephone Encounter (Signed)
Patient scheduled for VV. Confirmed nothing acute. Having lingering cough

## 2022-11-14 ENCOUNTER — Encounter: Payer: Self-pay | Admitting: Internal Medicine

## 2022-11-14 ENCOUNTER — Telehealth (INDEPENDENT_AMBULATORY_CARE_PROVIDER_SITE_OTHER): Payer: PPO | Admitting: Internal Medicine

## 2022-11-14 ENCOUNTER — Other Ambulatory Visit: Payer: Self-pay

## 2022-11-14 VITALS — Ht 69.0 in | Wt 173.0 lb

## 2022-11-14 DIAGNOSIS — I10 Essential (primary) hypertension: Secondary | ICD-10-CM | POA: Diagnosis not present

## 2022-11-14 DIAGNOSIS — I2510 Atherosclerotic heart disease of native coronary artery without angina pectoris: Secondary | ICD-10-CM | POA: Diagnosis not present

## 2022-11-14 DIAGNOSIS — R059 Cough, unspecified: Secondary | ICD-10-CM

## 2022-11-14 MED ORDER — TRAZODONE HCL 50 MG PO TABS: 50.0000 mg | ORAL_TABLET | Freq: Every evening | ORAL | 1 refills | Status: DC | PRN

## 2022-11-14 MED ORDER — PANTOPRAZOLE SODIUM 40 MG PO TBEC: 40.0000 mg | DELAYED_RELEASE_TABLET | Freq: Every day | ORAL | 3 refills | Status: DC

## 2022-11-14 MED ORDER — PANTOPRAZOLE SODIUM 40 MG PO TBEC: 80.0000 mg | DELAYED_RELEASE_TABLET | Freq: Every day | ORAL | 1 refills | Status: DC

## 2022-11-14 MED ORDER — IRBESARTAN 300 MG PO TABS: 300.0000 mg | ORAL_TABLET | Freq: Every day | ORAL | 3 refills | Status: DC

## 2022-11-14 MED ORDER — PREGABALIN 75 MG PO CAPS: 75.0000 mg | ORAL_CAPSULE | Freq: Two times a day (BID) | ORAL | 2 refills | Status: DC

## 2022-11-14 MED ORDER — PREDNISONE 10 MG PO TABS: ORAL_TABLET | ORAL | 0 refills | Status: DC

## 2022-11-14 NOTE — Progress Notes (Signed)
[name], female   DOB: [birth year], 72 y.o.   MRN: [id number]   Virtual Visit via video Note   All issues noted in this document were discussed and addressed.  No physical exam was performed (except for noted visual exam findings with Video Visits).   I connected with Alexandra Mendoza by a video enabled telemedicine application and verified that I am speaking with the correct person using two identifiers. Location patient: home Location provider: work Persons participating in the virtual visit: patient, provider  The limitations, risks, security and privacy concerns of performing an evaluation and management service by video and the availability of in person appointments have been discussed.  It has also been discussed with the patient that there may be a patient responsible charge related to this service. The patient expressed understanding and agreed to proceed.   Reason for visit: work in appt.   HPI: Work in - diagnosed with acute viral illness - urgent care 11/06/22. Recent cruise.  Started having cough, chills and fever.  Home covid negative. Has been on nyquil and coricidin. Persistent cough.  No fever since initial.  Persistent lingering cough and congestion.  Loose congestion.  No sore throat.  Increased drainage.  No chest congestion or sob.  On protonix bid. No increased acid reflux reported.  Saw Dr Clayborn Bigness 10/23/22 - recommended myoview and echo.  Continue aspirin and atorvastatin.  Myoview overall had showed good exercise tolerance walking 7 minutes under the Bruce protocol with no significant symptom development. No evidence of myocardial ischemia by Myoview. Echocardiogram had showed overall normal LV and RV systolic function, EF greater than 55% with moderate TR overall unchanged from 2022     ROS: See pertinent positives and negatives per HPI.  Past Medical History:  Diagnosis Date   Acid reflux    Coronary artery disease    Heart disease    H/O triple  bypass (04/2014)   Heart murmur    History of chicken pox    Hyperlipidemia    Hypertension    Insomnia     Past Surgical History:  Procedure Laterality Date   APPENDECTOMY     BREAST EXCISIONAL BIOPSY Right    negative over 5 years ago- neg   BREAST SURGERY     Biopsy   COLONOSCOPY WITH PROPOFOL N/A 05/16/2016   Procedure: COLONOSCOPY WITH PROPOFOL;  Surgeon: [name], MD;  Location: [facility];  Service: Endoscopy;  Laterality: N/A;   CORONARY ARTERY BYPASS GRAFT  05/01/2014   3 vessel, [facility]   ESOPHAGOGASTRODUODENOSCOPY (EGD) WITH PROPOFOL N/A 02/23/2017   Procedure: ESOPHAGOGASTRODUODENOSCOPY (EGD) WITH PROPOFOL;  Surgeon: [name], MD;  Location: [place];  Service: Endoscopy;  Laterality: N/A;   HYSTEROSCOPY WITH D & C     lipoma removed     removed from forehead   triple bypass      Family History  Problem Relation Age of Onset   Breast cancer Other        maternal great aunt   Heart disease Other        multiple family members   Heart disease Mother    Stroke Mother    Alzheimer's disease Mother    Heart disease Father    Stroke Father    Breast cancer Sister 76   Alzheimer's disease Maternal Aunt    Alzheimer's disease Maternal Uncle    Alzheimer's disease Maternal Grandmother    Colon cancer Neg Hx  Diabetes Neg Hx    Ovarian cancer Neg Hx     SOCIAL HX: reviewed.   Current Outpatient Medications:    predniSONE (DELTASONE) 10 MG tablet, Take 4 tablets x 1 day and then decrease by 1/2 tablet per day until down to zero mg., Disp: 18 tablet, Rfl: 0   Alpha-Lipoic Acid 100 MG CAPS, Take 600 mg by mouth daily., Disp: , Rfl:    amLODipine (NORVASC) 10 MG tablet, Take 10 mg by mouth daily., Disp: , Rfl: 4   ascorbic acid (VITAMIN C) 1000 MG tablet, Take by mouth., Disp: , Rfl:    aspirin EC 81 MG tablet, Take 81 mg by mouth daily., Disp: , Rfl:    atorvastatin (LIPITOR) 40 MG tablet, Take by mouth., Disp: , Rfl:     calcium-vitamin D (OSCAL WITH D) 500-200 MG-UNIT TABS tablet, Take by mouth., Disp: , Rfl:    carvedilol (COREG) 6.25 MG tablet, Take 6.25 mg by mouth 2 (two) times daily., Disp: , Rfl:    cyanocobalamin 1000 MCG tablet, Take 1,000 mcg by mouth daily., Disp: , Rfl:    Docusate Calcium (STOOL SOFTENER PO), Take by mouth., Disp: , Rfl:    hydrochlorothiazide (MICROZIDE) 12.5 MG capsule, TAKE 1 CAPSULE BY MOUTH EVERY DAY, Disp: 90 capsule, Rfl: 1   irbesartan (AVAPRO) 300 MG tablet, Take 1 tablet (300 mg total) by mouth daily., Disp: 90 tablet, Rfl: 3   Omega-3 Fatty Acids (FISH OIL) 1000 MG CAPS, Take 1,200 mg by mouth daily., Disp: , Rfl:    pantoprazole (PROTONIX) 40 MG tablet, Take 2 tablets (80 mg total) by mouth daily. Take 30 minutes before breakfast, Disp: 180 tablet, Rfl: 1   pregabalin (LYRICA) 75 MG capsule, Take 1 capsule (75 mg total) by mouth 2 (two) times daily., Disp: 60 capsule, Rfl: 2   Probiotic Product (PROBIOTIC PO), Take by mouth., Disp: , Rfl:    spironolactone (ALDACTONE) 25 MG tablet, Take 25 mg by mouth daily., Disp: , Rfl:    traZODone (DESYREL) 50 MG tablet, Take 1-2 tablets (50-100 mg total) by mouth at bedtime as needed., Disp: 180 tablet, Rfl: 1  EXAM:  GENERAL: alert, oriented, appears well and in no acute distress  HEENT: atraumatic, conjunttiva clear, no obvious abnormalities on inspection of external nose and ears  NECK: normal movements of the head and neck  LUNGS: on inspection no signs of respiratory distress, breathing rate appears normal, no obvious gross SOB, gasping or wheezing  CV: no obvious cyanosis  PSYCH/NEURO: pleasant and cooperative, no obvious depression or anxiety, speech and thought processing grossly intact  ASSESSMENT AND PLAN:  Discussed the following assessment and plan:  Problem List Items Addressed This Visit     Essential hypertension    Currently on coreg, amlodipine, hctz, avapro and aldactone.  Previous abdominal ultrasound  - kidneys relatively equal size, etc. Saw nephrology.  No changes made.  Pressure better.  Follow.        Relevant Medications   irbesartan (AVAPRO) 300 MG tablet   Cough - Primary    Persistent increased cough and congestion as outlined.  Drainage.  Saline nasal spray/steroid nasal spray as directed.  Robitussin DM.  Prednisone taper as directed.  Follow.  Call with update.       CAD (coronary artery disease)    Was seeing Dr Nehemiah Massed.  Continue risk factor modification.  Stable.  Request to establish care with Dr Rockey Situ - for f/u CAD.  Relevant Medications   irbesartan (AVAPRO) 300 MG tablet   Other Relevant Orders   Ambulatory referral to Cardiology    Return if symptoms worsen or fail to improve.   I discussed the assessment and treatment plan with the patient. The patient was provided an opportunity to ask questions and all were answered. The patient agreed with the plan and demonstrated an understanding of the instructions.   The patient was advised to call back or seek an in-person evaluation if the symptoms worsen or if the condition fails to improve as anticipated.   Einar Pheasant, MD

## 2022-11-19 ENCOUNTER — Encounter: Payer: Self-pay | Admitting: Internal Medicine

## 2022-11-19 DIAGNOSIS — R059 Cough, unspecified: Secondary | ICD-10-CM | POA: Insufficient documentation

## 2022-11-19 NOTE — Assessment & Plan Note (Signed)
Currently on coreg, amlodipine, hctz, avapro and aldactone.  Previous abdominal ultrasound - kidneys relatively equal size, etc. Saw nephrology.  No changes made.  Pressure better.  Follow.   

## 2022-11-19 NOTE — Assessment & Plan Note (Signed)
Persistent increased cough and congestion as outlined.  Drainage.  Saline nasal spray/steroid nasal spray as directed.  Robitussin DM.  Prednisone taper as directed.  Follow.  Call with update.

## 2022-11-19 NOTE — Assessment & Plan Note (Signed)
Was seeing Dr Kowalski.  Continue risk factor modification.  Stable.  Request to establish care with Dr Gollan - for f/u CAD.  

## 2022-11-22 ENCOUNTER — Encounter: Payer: Self-pay | Admitting: Internal Medicine

## 2022-12-05 ENCOUNTER — Ambulatory Visit
Admission: RE | Admit: 2022-12-05 | Discharge: 2022-12-05 | Disposition: A | Discharge status: Home / Self Care | Payer: PPO | Source: Ambulatory Visit | Attending: Urgent Care | Admitting: Urgent Care

## 2022-12-05 VITALS — BP 145/77 | HR 80 | Temp 98.4°F | Resp 16

## 2022-12-05 DIAGNOSIS — N3000 Acute cystitis without hematuria: Secondary | ICD-10-CM | POA: Insufficient documentation

## 2022-12-05 DIAGNOSIS — B3731 Acute candidiasis of vulva and vagina: Secondary | ICD-10-CM | POA: Insufficient documentation

## 2022-12-05 DIAGNOSIS — R300 Dysuria: Secondary | ICD-10-CM | POA: Insufficient documentation

## 2022-12-05 LAB — POCT URINALYSIS DIP (MANUAL ENTRY)
Bilirubin, UA: NEGATIVE
Blood, UA: NEGATIVE
Glucose, UA: NEGATIVE mg/dL
Nitrite, UA: NEGATIVE
Spec Grav, UA: 1.025 (ref 1.010–1.025)
Urobilinogen, UA: 0.2 E.U./dL
pH, UA: 7 (ref 5.0–8.0)

## 2022-12-05 MED ORDER — FLUCONAZOLE 150 MG PO TABS: 150.0000 mg | ORAL_TABLET | Freq: Once | ORAL | 0 refills | Status: AC

## 2022-12-05 MED ORDER — NITROFURANTOIN MONOHYD MACRO 100 MG PO CAPS: 100.0000 mg | ORAL_CAPSULE | Freq: Two times a day (BID) | ORAL | 0 refills | Status: DC

## 2022-12-05 NOTE — Discharge Instructions (Signed)
Follow up here or with your primary care provider if your symptoms are worsening or not improving with treatment.     

## 2022-12-05 NOTE — ED Provider Notes (Signed)
Roderic Palau    CSN: [id number] Arrival date & time: 12/05/22  [id number]      History   Chief Complaint Chief Complaint  Patient presents with   Urinary Frequency    ?uti, urinary discomfort, urgency - Entered by patient    HPI KAYANI ANGELOTTI is a 72 y.o. female.    Urinary Frequency    Presents to urgent care with complaint of UTI symptoms including dysuria and urgency.  Symptoms since 1 week.  Abdominal pain and hematuria.  Denies fever.  Denies back pain.  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)   Heart murmur    History of chicken pox    Hyperlipidemia    Hypertension    Insomnia     Patient Active Problem List   Diagnosis Date Noted   Cough 11/19/2022   Aortic atherosclerosis ([place]) 11/01/2021   Near syncope 05/04/2021   Adrenal mass ([place]) 05/04/2021   Congestive heart failure ([place]) 05/04/2021   Other specified complication of vascular prosthetic devices, implants and grafts, initial encounter ([place]) 05/04/2021   Hypokalemia 11/14/2020   Thrombocytopenia ([place]) 11/14/2020   Neck fullness 07/19/2020   Right arm pain 07/19/2020   B12 deficiency 03/21/2020   Rash 11/09/2019   Hypercalcemia 06/29/2019   Bilateral carotid artery stenosis 06/17/2018   Bilateral leg and foot pain 03/05/2018   Burning sensation of lower extremity 03/05/2018   Neuropathy 02/15/2018   Vaginal atrophy 06/12/2017   Menopause 06/12/2017   Osteopenia 06/12/2017   Heartburn    Chest pain 01/08/2017   Palpitations 11/28/2016   Headache 07/07/2016   Special screening for malignant neoplasms, colon    Benign neoplasm of transverse colon    Family history of breast cancer in first degree relative 10/25/2015   Fibrocystic breast changes of both breasts 10/25/2015   Abdominal discomfort 08/22/2015   Abnormal vaginal Pap smear 05/18/2015   Adaptation reaction 05/05/2015   Anxiety 05/05/2015    History of chicken pox 05/05/2015   HLD (hyperlipidemia) 05/05/2015   Mitral valve disorder 05/05/2015   Osteoarthrosis 05/05/2015   Adiposity 05/05/2015   H/O coronary artery bypass surgery 05/05/2015   CAD (coronary artery disease) 03/14/2015   Essential hypertension 03/14/2015   Hypercholesterolemia 03/14/2015   Health care maintenance 03/14/2015   Insomnia 03/08/2015   Esophagitis, reflux 12/11/2014   History of atrial fibrillation 06/29/2014   Arteriosclerosis of bypass graft of coronary artery 06/01/2014   Billowing mitral valve 06/01/2014   SCC (squamous cell carcinoma), 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   Adrenal benign neoplasm 12/31/2012    Past Surgical History:  Procedure Laterality Date   APPENDECTOMY     BREAST EXCISIONAL BIOPSY Right    negative over 5 years ago- neg   BREAST SURGERY     Biopsy   COLONOSCOPY WITH PROPOFOL N/A 05/16/2016   Procedure: COLONOSCOPY WITH PROPOFOL;  Surgeon: [name], MD;  Location: [facility];  Service: Endoscopy;  Laterality: N/A;   CORONARY ARTERY BYPASS GRAFT  05/01/2014   3 vessel, [facility]   ESOPHAGOGASTRODUODENOSCOPY (EGD) WITH PROPOFOL N/A 02/23/2017   Procedure: ESOPHAGOGASTRODUODENOSCOPY (EGD) WITH PROPOFOL;  Surgeon: [name], MD;  Location: [place];  Service: Endoscopy;  Laterality: N/A;   HYSTEROSCOPY WITH D & C     lipoma removed     removed from forehead   triple bypass  OB History     Gravida  1   Para  1   Term  1   Preterm      AB      Living  1      SAB      IAB      Ectopic      Multiple      Live Births  1            Home Medications    Prior to Admission medications   Medication Sig Start Date End Date Taking? Authorizing Provider  Alpha-Lipoic Acid 100 MG CAPS Take 600 mg by mouth daily.    [provider]  amLODipine (NORVASC) 10 MG tablet Take 10 mg by mouth daily. 03/08/15   [provider]  ascorbic acid (VITAMIN C) 1000 MG tablet Take  by mouth.    [provider]  aspirin EC 81 MG tablet Take 81 mg by mouth daily. 10/04/12   [provider]  atorvastatin (LIPITOR) 40 MG tablet Take by mouth. 03/22/20 03/02/22  [provider]  calcium-vitamin D (OSCAL WITH D) 500-200 MG-UNIT TABS tablet Take by mouth.    [provider]  carvedilol (COREG) 6.25 MG tablet Take 6.25 mg by mouth 2 (two) times daily. 02/11/20   [provider]  cyanocobalamin 1000 MCG tablet Take 1,000 mcg by mouth daily.    [provider]  Docusate Calcium (STOOL SOFTENER PO) Take by mouth.    [provider]  hydrochlorothiazide (MICROZIDE) 12.5 MG capsule TAKE 1 CAPSULE BY MOUTH EVERY DAY 09/25/22   Einar Pheasant, MD  irbesartan (AVAPRO) 300 MG tablet Take 1 tablet (300 mg total) by mouth daily. 11/14/22   Einar Pheasant, MD  Omega-3 Fatty Acids (FISH OIL) 1000 MG CAPS Take 1,200 mg by mouth daily.    [provider]  pantoprazole (PROTONIX) 40 MG tablet Take 2 tablets (80 mg total) by mouth daily. Take 30 minutes before breakfast 11/14/22   Einar Pheasant, MD  predniSONE (DELTASONE) 10 MG tablet Take 4 tablets x 1 day and then decrease by 1/2 tablet per day until down to zero mg. 11/14/22   Einar Pheasant, MD  pregabalin (LYRICA) 75 MG capsule Take 1 capsule (75 mg total) by mouth 2 (two) times daily. 11/14/22   Einar Pheasant, MD  Probiotic Product (PROBIOTIC PO) Take by mouth.    [provider]  spironolactone (ALDACTONE) 25 MG tablet Take 25 mg by mouth daily. 11/09/20   [provider]  traZODone (DESYREL) 50 MG tablet Take 1-2 tablets (50-100 mg total) by mouth at bedtime as needed. 11/14/22   Einar Pheasant, MD    Family History Family History  Problem Relation Age of Onset   Breast cancer Other        maternal great aunt   Heart disease Other        multiple family members   Heart disease Mother    Stroke Mother    Alzheimer's disease Mother    Heart disease Father     Stroke Father    Breast cancer Sister 59   Alzheimer's disease Maternal Aunt    Alzheimer's disease Maternal Uncle    Alzheimer's disease Maternal Grandmother    Colon cancer Neg Hx    Diabetes Neg Hx    Ovarian cancer Neg Hx     Social History Social History   Tobacco Use   Smoking status: Never   Smokeless tobacco: Never  Vaping Use  Vaping Use: Never used  Substance Use Topics   Alcohol use: Never    Alcohol/week: 0.0 standard drinks of alcohol   Drug use: No     Allergies   Valsartan and Hydralazine   Review of Systems Review of Systems  Genitourinary:  Positive for frequency.     Physical Exam Triage Vital Signs ED Triage Vitals  Enc Vitals Group     BP      Pulse      Resp      Temp      Temp src      SpO2      Weight      Height      Head Circumference      Peak Flow      Pain Score      Pain Loc      Pain Edu?      Excl. in [place]?    No data found.  Updated Vital Signs There were no vitals taken for this visit.  Visual Acuity Right Eye Distance:   Left Eye Distance:   Bilateral Distance:    Right Eye Near:   Left Eye Near:    Bilateral Near:     Physical Exam Vitals reviewed.  Constitutional:      Appearance: Normal appearance.  Neurological:     General: No focal deficit present.     Mental Status: She is alert and oriented to person, place, and time.  Psychiatric:        Mood and Affect: Mood normal.        Behavior: Behavior normal.      UC Treatments / Results  Labs (all labs ordered are listed, but only abnormal results are displayed) Labs Reviewed  POCT URINALYSIS DIP (MANUAL ENTRY) - Abnormal; Notable for the following components:      Result Value   Clarity, UA cloudy (*)    Ketones, POC UA small (15) (*)    Protein Ur, POC trace (*)    Leukocytes, UA Large (3+) (*)    All other components within normal limits  URINE CULTURE    EKG   Radiology No results found.  Procedures Procedures (including  critical care time)  Medications Ordered in UC Medications - No data to display  Initial Impression / Assessment and Plan / UC Course  I have reviewed the triage vital signs and the nursing notes.  Pertinent labs & imaging results that were available during my care of the patient were reviewed by me and considered in my medical decision making (see chart for details).   UA is strongly indicative of UTI with large leukocytes present.  Will treat with Macrobid.  GFR greater than 60.   Final Clinical Impressions(s) / UC Diagnoses   Final diagnoses:  Dysuria   Discharge Instructions   None    ED Prescriptions   None    PDMP not reviewed this encounter.   Rose Phi, [place] 12/05/22 [id number]

## 2022-12-05 NOTE — ED Triage Notes (Signed)
Pt reports urinary frequency, dysuria x 1 week. Denies abd pain and hematuria.

## 2022-12-07 LAB — URINE CULTURE: Culture: 40000 — AB

## 2022-12-21 DIAGNOSIS — C44311 Basal cell carcinoma of skin of nose: Secondary | ICD-10-CM | POA: Diagnosis not present

## 2022-12-21 DIAGNOSIS — D225 Melanocytic nevi of trunk: Secondary | ICD-10-CM | POA: Diagnosis not present

## 2022-12-21 DIAGNOSIS — D2272 Melanocytic nevi of left lower limb, including hip: Secondary | ICD-10-CM | POA: Diagnosis not present

## 2022-12-21 DIAGNOSIS — L821 Other seborrheic keratosis: Secondary | ICD-10-CM | POA: Diagnosis not present

## 2022-12-21 DIAGNOSIS — D2262 Melanocytic nevi of left upper limb, including shoulder: Secondary | ICD-10-CM | POA: Diagnosis not present

## 2022-12-21 DIAGNOSIS — D2261 Melanocytic nevi of right upper limb, including shoulder: Secondary | ICD-10-CM | POA: Diagnosis not present

## 2022-12-21 DIAGNOSIS — D2271 Melanocytic nevi of right lower limb, including hip: Secondary | ICD-10-CM | POA: Diagnosis not present

## 2022-12-21 DIAGNOSIS — D485 Neoplasm of uncertain behavior of skin: Secondary | ICD-10-CM | POA: Diagnosis not present

## 2023-01-10 HISTORY — PX: OTHER SURGICAL HISTORY: SHX169

## 2023-01-24 DIAGNOSIS — C44311 Basal cell carcinoma of skin of nose: Secondary | ICD-10-CM | POA: Diagnosis not present

## 2023-01-24 DIAGNOSIS — L814 Other melanin hyperpigmentation: Secondary | ICD-10-CM | POA: Diagnosis not present

## 2023-01-24 DIAGNOSIS — L578 Other skin changes due to chronic exposure to nonionizing radiation: Secondary | ICD-10-CM | POA: Diagnosis not present

## 2023-01-24 DIAGNOSIS — L988 Other specified disorders of the skin and subcutaneous tissue: Secondary | ICD-10-CM | POA: Diagnosis not present

## 2023-01-28 NOTE — Progress Notes (Unsigned)
Cardiology Office Note  Date:  01/29/2023   ID:  Alexandra Mendoza, Alexandra Mendoza October 25, 1950, MRN [id number]  PCP:  Dale Ethan, MD   Chief Complaint  Patient presents with   New Patient (Initial Visit)    Establish care for CAD/CABG x 3 in 2015; former Dr. Gwen Pounds patient. Medications reviewed by the patient verbally.     HPI:  Alexandra Mendoza is a 72 year old woman with past medical history of coronary artery disease  s/p CABG with LIMA to LAD, SVG to ramus and OM1 in 2015,  hyperlipidemia,  hypertension,  PAD bilateral carotid artery stenosis less than 50% bilaterally 2019.  Who presents by referral from Dr. Dale [place] for coronary artery disease  Prior cardiac history reviewed in detail [place] developed symptoms of feeling unwell, workup with stress test leading to cardiac catheterization detailing coronary blockages  Since CABG has been feeling well with no complaints Vague chest discomfort February 2024, workup with echo and stress test as below Symptoms most likely from GERD  Hypertension treated with amlodipine 10 mg daily carvedilol 6.25 mg twice daily irbesartan 300 mg daily hydrochlorothiazide 12.5 mg daily spironolactone 25 mg daily   Walks 3x a week, walks at [employer] well on exertion, no chest pain concerning for angina  Lab work reviewed A1c 5.4 Cholesterol 100 LDL 45 Creatinine 0.87  Echo February 2024, outside study NORMAL LEFT VENTRICULAR SYSTOLIC FUNCTION NORMAL RIGHT VENTRICULAR SYSTOLIC FUNCTION MODERATE VALVULAR REGURGITATION, tricuspid valve NO VALVULAR STENOSIS ESTIMATED LVEF >55%  Right ventricular systolic pressure 26+ right atrial pressure  Treadmill stress test Myoview February 2024, No ischemia, EF 65%  EKG personally reviewed by myself on todays visit Normal sinus rhythm rate 64 bpm no significant ST-T wave changes  PMH:   has a past medical history of Acid reflux, Coronary artery disease, Heart disease, Heart murmur,  History of chicken pox, Hyperlipidemia, Hypertension, and Insomnia.  PSH:    Past Surgical History:  Procedure Laterality Date   APPENDECTOMY     BREAST EXCISIONAL BIOPSY Right    negative over 5 years ago- neg   BREAST SURGERY     Biopsy   COLONOSCOPY WITH PROPOFOL N/A 05/16/2016   Procedure: COLONOSCOPY WITH PROPOFOL;  Surgeon: Midge Minium, MD;  Location: [facility];  Service: Endoscopy;  Laterality: N/A;   CORONARY ARTERY BYPASS GRAFT  05/01/2014   3 vessel, [facility]   ESOPHAGOGASTRODUODENOSCOPY (EGD) WITH PROPOFOL N/A 02/23/2017   Procedure: ESOPHAGOGASTRODUODENOSCOPY (EGD) WITH PROPOFOL;  Surgeon: Midge Minium, MD;  Location: [facility];  Service: Endoscopy;  Laterality: N/A;   HYSTEROSCOPY WITH D & C     lipoma removed     removed from forehead   triple bypass      Current Outpatient Medications  Medication Sig Dispense Refill   Alpha-Lipoic Acid 100 MG CAPS Take 600 mg by mouth daily.     amLODipine (NORVASC) 10 MG tablet Take 10 mg by mouth daily.  4   ascorbic acid (VITAMIN C) 1000 MG tablet Take by mouth.     aspirin EC 81 MG tablet Take 81 mg by mouth daily.     atorvastatin (LIPITOR) 40 MG tablet Take by mouth.     CALCIUM MAGNESIUM ZINC PO Take by mouth daily.     carvedilol (COREG) 6.25 MG tablet Take 6.25 mg by mouth 2 (two) times daily.     Cholecalciferol (VITAMIN D3) 1000 units CAPS 1 cap(s) Orally Once a day  cyanocobalamin 1000 MCG tablet Take 1,000 mcg by mouth daily.     Docusate Calcium (STOOL SOFTENER PO) Take by mouth.     docusate sodium (COLACE) 100 MG capsule 1 capsule as needed Orally BID     hydrochlorothiazide (MICROZIDE) 12.5 MG capsule TAKE 1 CAPSULE BY MOUTH EVERY DAY 90 capsule 1   irbesartan (AVAPRO) 300 MG tablet Take 1 tablet (300 mg total) by mouth daily. 90 tablet 3   Omega-3 Fatty Acids (FISH OIL) 1000 MG CAPS Take 1,200 mg by mouth daily.     pantoprazole (PROTONIX) 40 MG tablet Take 2 tablets (80 mg total) by  mouth daily. Take 30 minutes before breakfast 180 tablet 1   pregabalin (LYRICA) 75 MG capsule Take 1 capsule (75 mg total) by mouth 2 (two) times daily. 60 capsule 2   Probiotic Product (PROBIOTIC PO) Take by mouth.     spironolactone (ALDACTONE) 25 MG tablet Take 25 mg by mouth daily.     traZODone (DESYREL) 50 MG tablet Take 1-2 tablets (50-100 mg total) by mouth at bedtime as needed. 180 tablet 1   nitrofurantoin, macrocrystal-monohydrate, (MACROBID) 100 MG capsule Take 1 capsule (100 mg total) by mouth 2 (two) times daily. (Patient not taking: Reported on 01/29/2023) 10 capsule 0   No current facility-administered medications for this visit.    Allergies:   Valsartan and Hydralazine   Social History:  The patient  reports that she has never smoked. She has never used smokeless tobacco. She reports that she does not drink alcohol and does not use drugs.   Family History:   family history includes Alzheimer's disease in her maternal aunt, maternal grandmother, maternal uncle, and mother; Breast cancer in an other family member; Breast cancer (age of onset: 38) in her sister; Heart disease in her father, mother, and another family member; Stroke in her father and mother.    Review of Systems: Review of Systems  Constitutional: Negative.   HENT: Negative.    Respiratory: Negative.    Cardiovascular: Negative.   Gastrointestinal: Negative.   Musculoskeletal: Negative.   Neurological: Negative.   Psychiatric/Behavioral: Negative.    All other systems reviewed and are negative.    PHYSICAL EXAM: VS:  BP 124/60 (BP Location: Right Arm, Patient Position: Sitting, Cuff Size: Normal)   Pulse 64   Ht 5' 9.5" (1.765 m)   Wt 172 lb (78 kg)   SpO2 98%   BMI 25.04 kg/m  , BMI Body mass index is 25.04 kg/m. GEN: Well nourished, well developed, in no acute distress HEENT: normal Neck: no JVD, carotid bruits, or masses Cardiac: RRR; no murmurs, rubs, or gallops,no edema  Respiratory:   clear to auscultation bilaterally, normal work of breathing GI: soft, nontender, nondistended, + BS Alexandra: no deformity or atrophy Skin: warm and dry, no rash Neuro:  Strength and sensation are intact Psych: euthymic mood, full affect   Recent Labs: 04/13/2022: TSH 4.01 08/25/2022: ALT 19; BUN 13; Creatinine, Ser 0.87; Potassium 4.4; Sodium 138    Lipid Panel Lab Results  Component Value Date   CHOL 100 08/25/2022   HDL 37.30 (L) 08/25/2022   LDLCALC 45 08/25/2022   TRIG 87.0 08/25/2022      Wt Readings from Last 3 Encounters:  01/29/23 172 lb (78 kg)  11/14/22 173 lb (78.5 kg)  10/06/22 173 lb (78.5 kg)     ASSESSMENT AND PLAN:  Problem List Items Addressed This Visit       Cardiology Problems  CAD (coronary artery disease) - Primary   Relevant Orders   EKG 12-Lead   Essential hypertension   Aortic atherosclerosis (HCC)   Relevant Orders   EKG 12-Lead   HLD (hyperlipidemia)     Other   H/O coronary artery bypass surgery   Coronary artery disease with stable angina Currently with no symptoms of angina. No further workup at this time. Continue current medication regimen.  Hyperlipidemia Cholesterol is at goal on the current lipid regimen. No changes to the medications were made.  Essential hypertension Blood pressure is well controlled on today's visit. No changes made to the medications.  Aortic atherosclerosis Cholesterol at goal No prior diabetes or smoking history Continue aspirin, statin    Total encounter time more than 60 minutes  Greater than 50% was spent in counseling and coordination of care with the patient    Signed, Dossie Arbour, M.D., Ph.D. [facility] [place], [place] [phone]

## 2023-01-29 ENCOUNTER — Ambulatory Visit: Payer: PPO | Attending: Cardiovascular Disease | Admitting: Cardiovascular Disease

## 2023-01-29 ENCOUNTER — Encounter: Payer: Self-pay | Admitting: Cardiovascular Disease

## 2023-01-29 VITALS — BP 124/60 | HR 64 | Ht 69.5 in | Wt 172.0 lb

## 2023-01-29 DIAGNOSIS — Z951 Presence of aortocoronary bypass graft: Secondary | ICD-10-CM

## 2023-01-29 DIAGNOSIS — I25118 Atherosclerotic heart disease of native coronary artery with other forms of angina pectoris: Secondary | ICD-10-CM | POA: Diagnosis not present

## 2023-01-29 DIAGNOSIS — I10 Essential (primary) hypertension: Secondary | ICD-10-CM

## 2023-01-29 DIAGNOSIS — I700 Atherosclerosis of aorta: Secondary | ICD-10-CM

## 2023-01-29 DIAGNOSIS — E782 Mixed hyperlipidemia: Secondary | ICD-10-CM

## 2023-01-29 NOTE — Patient Instructions (Signed)
Medication Instructions:  No changes  If you need a refill on your cardiac medications before your next appointment, please call your pharmacy.    Lab work: No new labs needed   Testing/Procedures: No new testing needed   Follow-Up: At [facility], you and your health needs are our priority.  As part of our continuing mission to provide you with exceptional heart care, we have created designated Provider Care Teams.  These Care Teams include your primary Cardiologist (physician) and Advanced Practice Providers (APPs -  Physician Assistants and Nurse Practitioners) who all work together to provide you with the care you need, when you need it.  You will need a follow up appointment in 6 months  Providers on your designated Care Team:   Christopher Berge, NP Ryan Dunn, PA-C Cadence Furth, PA-C  COVID-19 Vaccine Information can be found at: [email or web address] For questions related to vaccine distribution or appointments, please email [email or web address] or call [phone].   

## 2023-01-31 DIAGNOSIS — C44622 Squamous cell carcinoma of skin of right upper limb, including shoulder: Secondary | ICD-10-CM | POA: Diagnosis not present

## 2023-01-31 DIAGNOSIS — D485 Neoplasm of uncertain behavior of skin: Secondary | ICD-10-CM | POA: Diagnosis not present

## 2023-02-06 ENCOUNTER — Other Ambulatory Visit (INDEPENDENT_AMBULATORY_CARE_PROVIDER_SITE_OTHER): Payer: PPO

## 2023-02-06 DIAGNOSIS — I10 Essential (primary) hypertension: Secondary | ICD-10-CM | POA: Diagnosis not present

## 2023-02-06 DIAGNOSIS — E7800 Pure hypercholesterolemia, unspecified: Secondary | ICD-10-CM

## 2023-02-06 LAB — CBC WITH DIFFERENTIAL/PLATELET
Basophils Absolute: 0.1 K/uL (ref 0.0–0.1)
Basophils Relative: 0.8 % (ref 0.0–3.0)
Eosinophils Absolute: 0.2 K/uL (ref 0.0–0.7)
Eosinophils Relative: 2.9 % (ref 0.0–5.0)
HCT: 37.3 % (ref 36.0–46.0)
Hemoglobin: 12.4 g/dL (ref 12.0–15.0)
Lymphocytes Relative: 32.3 % (ref 12.0–46.0)
Lymphs Abs: 2 K/uL (ref 0.7–4.0)
MCHC: 33.2 g/dL (ref 30.0–36.0)
MCV: 92.5 fl (ref 78.0–100.0)
Monocytes Absolute: 0.8 K/uL (ref 0.1–1.0)
Monocytes Relative: 12.5 % — ABNORMAL HIGH (ref 3.0–12.0)
Neutro Abs: 3.2 K/uL (ref 1.4–7.7)
Neutrophils Relative %: 51.5 % (ref 43.0–77.0)
Platelets: 190 K/uL (ref 150.0–400.0)
RBC: 4.03 Mil/uL (ref 3.87–5.11)
RDW: 13.2 % (ref 11.5–15.5)
WBC: 6.2 K/uL (ref 4.0–10.5)

## 2023-02-06 LAB — HEPATIC FUNCTION PANEL
ALT: 15 U/L (ref 0–35)
AST: 15 U/L (ref 0–37)
Albumin: 4.1 g/dL (ref 3.5–5.2)
Alkaline Phosphatase: 28 U/L — ABNORMAL LOW (ref 39–117)
Bilirubin, Direct: 0.1 mg/dL (ref 0.0–0.3)
Total Bilirubin: 0.5 mg/dL (ref 0.2–1.2)
Total Protein: 6.4 g/dL (ref 6.0–8.3)

## 2023-02-06 LAB — BASIC METABOLIC PANEL
BUN: 21 mg/dL (ref 6–23)
CO2: 28 mEq/L (ref 19–32)
Calcium: 10 mg/dL (ref 8.4–10.5)
Chloride: 101 mEq/L (ref 96–112)
Creatinine, Ser: 0.96 mg/dL (ref 0.40–1.20)
GFR: 59.45 mL/min — ABNORMAL LOW (ref >60.00)
Glucose, Bld: 89 mg/dL (ref 70–99)
Potassium: 4.3 mEq/L (ref 3.5–5.1)
Sodium: 137 mEq/L (ref 135–145)

## 2023-02-06 LAB — LIPID PANEL
Cholesterol: 100 mg/dL (ref 0–200)
HDL: 34.2 mg/dL — ABNORMAL LOW
LDL Cholesterol: 51 mg/dL (ref 0–99)
NonHDL: 65.8
Total CHOL/HDL Ratio: 3
Triglycerides: 74 mg/dL (ref 0.0–149.0)
VLDL: 14.8 mg/dL (ref 0.0–40.0)

## 2023-02-09 ENCOUNTER — Ambulatory Visit (INDEPENDENT_AMBULATORY_CARE_PROVIDER_SITE_OTHER): Payer: PPO | Admitting: Internal Medicine

## 2023-02-09 ENCOUNTER — Encounter: Payer: Self-pay | Admitting: Internal Medicine

## 2023-02-09 VITALS — BP 124/72 | HR 63 | Temp 97.9°F | Resp 16 | Ht 69.0 in | Wt 171.6 lb

## 2023-02-09 DIAGNOSIS — I2510 Atherosclerotic heart disease of native coronary artery without angina pectoris: Secondary | ICD-10-CM

## 2023-02-09 DIAGNOSIS — R87629 Unspecified abnormal cytological findings in specimens from vagina: Secondary | ICD-10-CM | POA: Diagnosis not present

## 2023-02-09 DIAGNOSIS — I10 Essential (primary) hypertension: Secondary | ICD-10-CM | POA: Diagnosis not present

## 2023-02-09 DIAGNOSIS — E278 Other specified disorders of adrenal gland: Secondary | ICD-10-CM

## 2023-02-09 DIAGNOSIS — I700 Atherosclerosis of aorta: Secondary | ICD-10-CM

## 2023-02-09 DIAGNOSIS — G629 Polyneuropathy, unspecified: Secondary | ICD-10-CM

## 2023-02-09 DIAGNOSIS — C44320 Squamous cell carcinoma of skin of unspecified parts of face: Secondary | ICD-10-CM | POA: Diagnosis not present

## 2023-02-09 DIAGNOSIS — D696 Thrombocytopenia, unspecified: Secondary | ICD-10-CM | POA: Diagnosis not present

## 2023-02-09 DIAGNOSIS — K2100 Gastro-esophageal reflux disease with esophagitis, without bleeding: Secondary | ICD-10-CM | POA: Diagnosis not present

## 2023-02-09 DIAGNOSIS — Z1231 Encounter for screening mammogram for malignant neoplasm of breast: Secondary | ICD-10-CM

## 2023-02-09 DIAGNOSIS — F419 Anxiety disorder, unspecified: Secondary | ICD-10-CM

## 2023-02-09 DIAGNOSIS — E7800 Pure hypercholesterolemia, unspecified: Secondary | ICD-10-CM | POA: Diagnosis not present

## 2023-02-09 MED ORDER — PANTOPRAZOLE SODIUM 40 MG PO TBEC: 80.0000 mg | DELAYED_RELEASE_TABLET | Freq: Every day | ORAL | 3 refills | Status: DC

## 2023-02-09 MED ORDER — HYDROCHLOROTHIAZIDE 12.5 MG PO CAPS: 12.5000 mg | ORAL_CAPSULE | Freq: Every day | ORAL | 3 refills | Status: DC

## 2023-02-09 NOTE — Progress Notes (Unsigned)
Subjective:    [name], female    DOB: 1951-02-23, 72 y.o.   MRN: [id number]  Patient here for  Chief Complaint  Patient presents with   Medical Management of Chronic Issues    HPI Here to follow up regarding hypercholesterolemia, CAD and hypertension.  Saw Dr Juliann Pares 10/23/22 - recommended myoview and echo.  Continue aspirin and atorvastatin.  Myoview overall showed good exercise tolerance walking 7 minutes under the Bruce protocol with no significant symptom development. No evidence of myocardial ischemia by Myoview. Echocardiogram had showed overall normal LV and RV systolic function, EF greater than 55% with moderate TR overall unchanged from 2022  saw Dr Mariah Milling 01/29/23 - stable.  No changes made. Overall doing well.  Feels good.  No chest pain or sob reported.  No abdominal pain or bowel change reported.  Just had Mohs surgery - nose.  Basal cell. Doing well.  Taking lyrica.  Symptoms overall improved.  Discussed continuing current dose.    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)   Heart murmur    History of chicken pox    Hyperlipidemia    Hypertension    Insomnia    Past Surgical History:  Procedure Laterality Date   APPENDECTOMY     BREAST EXCISIONAL BIOPSY Right    negative over 5 years ago- neg   BREAST SURGERY     Biopsy   COLONOSCOPY WITH PROPOFOL N/A 05/16/2016   Procedure: COLONOSCOPY WITH PROPOFOL;  Surgeon: Midge Minium, MD;  Location: [facility];  Service: Endoscopy;  Laterality: N/A;   CORONARY ARTERY BYPASS GRAFT  05/01/2014   3 vessel, [facility]   ESOPHAGOGASTRODUODENOSCOPY (EGD) WITH PROPOFOL N/A 02/23/2017   Procedure: ESOPHAGOGASTRODUODENOSCOPY (EGD) WITH PROPOFOL;  Surgeon: Midge Minium, MD;  Location: [facility];  Service: Endoscopy;  Laterality: N/A;   HYSTEROSCOPY WITH D & C     lipoma removed     removed from forehead    triple bypass     Family History  Problem Relation Age of Onset   Breast cancer Other        maternal great aunt   Heart disease Other        multiple family members   Heart disease Mother    Stroke Mother    Alzheimer's disease Mother    Heart disease Father    Stroke Father    Breast cancer Sister 66   Alzheimer's disease Maternal Aunt    Alzheimer's disease Maternal Uncle    Alzheimer's disease Maternal Grandmother    Colon cancer Neg Hx    Diabetes Neg Hx    Ovarian cancer Neg Hx    Social History   Socioeconomic History   Marital status: Married    Spouse name: Not on file   Number of children: Not on file   Years of education: Not on file   Highest education level: Not on file  Occupational History   Not on file  Tobacco Use   Smoking status: Never   Smokeless tobacco: Never  Vaping Use   Vaping Use: Never used  Substance and Sexual Activity   Alcohol use: Never    Alcohol/week: 0.0 standard drinks of alcohol   Drug use: No   Sexual activity: Yes    Birth control/protection: Post-menopausal  Other Topics Concern   Not on file  Social History Narrative   Not on file   Social Determinants of Health   Financial Resource Strain: Low Risk  (10/05/2022)   Overall Financial Resource Strain (CARDIA)    Difficulty of Paying Living Expenses: Not hard at all  Food Insecurity: No Food Insecurity (10/05/2022)   Hunger Vital Sign    Worried About Running Out of Food in the Last Year: Never true    Ran Out of Food in the Last Year: Never true  Transportation Needs: No Transportation Needs (10/05/2022)   PRAPARE - Administrator, Civil Service (Medical): No    Lack of Transportation (Non-Medical): No  Physical Activity: Sufficiently Active (10/05/2022)   Exercise Vital Sign    Days of Exercise per Week: 3 days    Minutes of Exercise per Session: 50 min  Stress: No Stress Concern Present (10/05/2022)   [employer] of Occupational Health - Occupational  Stress Questionnaire    Feeling of Stress : Not at all  Social Connections: Unknown (10/05/2022)   Social Connection and Isolation Panel [NHANES]    Frequency of Communication with Friends and Family: More than three times a week    Frequency of Social Gatherings with Friends and Family: More than three times a week    Attends Religious Services: More than 4 times per year    Active Member of [employer] or Organizations: Yes    Attends Engineer, structural: More than 4 times per year    Marital Status: Not on file     Review of Systems  Constitutional:  Negative for appetite change and unexpected weight change.  HENT:  Negative for congestion and sinus pressure.   Respiratory:  Negative for cough, chest tightness and shortness of breath.   Cardiovascular:  Negative for chest pain and palpitations.  Gastrointestinal:  Negative for abdominal pain, diarrhea, nausea and vomiting.  Genitourinary:  Negative for difficulty urinating and dysuria.  Musculoskeletal:  Negative for joint swelling and myalgias.  Skin:  Negative for color change and rash.  Neurological:  Negative for dizziness and headaches.  Psychiatric/Behavioral:  Negative for agitation and dysphoric mood.        Objective:     BP 124/72   Pulse 63   Temp 97.9 F (36.6 C)   Resp 16   Ht 5\' 9"  (1.753 m)   Wt 171 lb 9.6 oz (77.8 kg)   SpO2 99%   BMI 25.34 kg/m  Wt Readings from Last 3 Encounters:  02/09/23 171 lb 9.6 oz (77.8 kg)  01/29/23 172 lb (78 kg)  11/14/22 173 lb (78.5 kg)    Physical Exam Vitals reviewed.  Constitutional:      General: She is not in acute distress.    Appearance: Normal appearance.  HENT:     Head: Normocephalic and atraumatic.     Right Ear: External ear normal.     Left Ear: External ear normal.  Eyes:     General: No scleral icterus.       Right eye: No discharge.        Left eye: No discharge.     Conjunctiva/sclera: Conjunctivae normal.  Neck:     Thyroid: No  thyromegaly.  Cardiovascular:     Rate and Rhythm: Normal rate and regular rhythm.  Pulmonary:     Effort: No respiratory distress.     Breath sounds: Normal breath sounds. No wheezing.  Abdominal:     General: Bowel sounds are normal.     Palpations:  Abdomen is soft.     Tenderness: There is no abdominal tenderness.  Musculoskeletal:        General: No swelling or tenderness.     Cervical back: Neck supple. No tenderness.  Lymphadenopathy:     Cervical: No cervical adenopathy.  Skin:    Findings: No erythema or rash.  Neurological:     Mental Status: She is alert.  Psychiatric:        Mood and Affect: Mood normal.        Behavior: Behavior normal.      Outpatient Encounter Medications as of 02/09/2023  Medication Sig   Alpha-Lipoic Acid 100 MG CAPS Take 600 mg by mouth daily.   amLODipine (NORVASC) 10 MG tablet Take 10 mg by mouth daily.   ascorbic acid (VITAMIN C) 1000 MG tablet Take by mouth.   aspirin EC 81 MG tablet Take 81 mg by mouth daily.   atorvastatin (LIPITOR) 40 MG tablet Take by mouth.   CALCIUM MAGNESIUM ZINC PO Take by mouth daily.   carvedilol (COREG) 6.25 MG tablet Take 6.25 mg by mouth 2 (two) times daily.   Cholecalciferol (VITAMIN D3) 1000 units CAPS 1 cap(s) Orally Once a day   cyanocobalamin 1000 MCG tablet Take 1,000 mcg by mouth daily.   Docusate Calcium (STOOL SOFTENER PO) Take by mouth.   docusate sodium (COLACE) 100 MG capsule 1 capsule as needed Orally BID   hydrochlorothiazide (MICROZIDE) 12.5 MG capsule Take 1 capsule (12.5 mg total) by mouth daily.   irbesartan (AVAPRO) 300 MG tablet Take 1 tablet (300 mg total) by mouth daily.   Omega-3 Fatty Acids (FISH OIL) 1000 MG CAPS Take 1,200 mg by mouth daily.   pantoprazole (PROTONIX) 40 MG tablet Take 2 tablets (80 mg total) by mouth daily. Take 30 minutes before breakfast   pregabalin (LYRICA) 75 MG capsule Take 1 capsule (75 mg total) by mouth 2 (two) times daily.   Probiotic Product (PROBIOTIC  PO) Take by mouth.   spironolactone (ALDACTONE) 25 MG tablet Take 25 mg by mouth daily.   traZODone (DESYREL) 50 MG tablet Take 1-2 tablets (50-100 mg total) by mouth at bedtime as needed.   [DISCONTINUED] hydrochlorothiazide (MICROZIDE) 12.5 MG capsule TAKE 1 CAPSULE BY MOUTH EVERY DAY   [DISCONTINUED] nitrofurantoin, macrocrystal-monohydrate, (MACROBID) 100 MG capsule Take 1 capsule (100 mg total) by mouth 2 (two) times daily. (Patient not taking: Reported on 01/29/2023)   [DISCONTINUED] pantoprazole (PROTONIX) 40 MG tablet Take 2 tablets (80 mg total) by mouth daily. Take 30 minutes before breakfast   No facility-administered encounter medications on file as of 02/09/2023.     Lab Results  Component Value Date   WBC 6.2 02/06/2023   HGB 12.4 02/06/2023   HCT 37.3 02/06/2023   PLT 190.0 02/06/2023   GLUCOSE 89 02/06/2023   CHOL 100 02/06/2023   TRIG 74.0 02/06/2023   HDL 34.20 (L) 02/06/2023   LDLCALC 51 02/06/2023   ALT 15 02/06/2023   AST 15 02/06/2023   NA 137 02/06/2023   K 4.3 02/06/2023   CL 101 02/06/2023   CREATININE 0.96 02/06/2023   BUN 21 02/06/2023   CO2 28 02/06/2023   TSH 4.01 04/13/2022    No results found.     Assessment & Plan:  Hypercholesterolemia Assessment & Plan: Continue lipitor.  Low cholesterol diet and exercise.  Follow lipid panel and liver function tests.   Orders: -     Lipid panel; Future -     Hepatic  function panel; Future  Essential hypertension Assessment & Plan: Currently on coreg, amlodipine, hctz, avapro and aldactone.  Previous abdominal ultrasound - kidneys relatively equal size, etc. Saw nephrology.  No changes made.  Pressure better.  Follow.    Orders: -     Basic metabolic panel; Future -     TSH; Future  Visit for screening mammogram -     3D Screening Mammogram, Left and Right; Future  Abnormal vaginal Pap smear Assessment & Plan: Had a documented history of abnormal pap smear.  Was followed by gyn. Last pap here  10/06/22 - negative with negative HPV.     Adrenal mass [facility]) Assessment & Plan: Has been evaluated by urology previously. 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 No further w/up warranted.    Anxiety Assessment & Plan: Continues trazodone at night.  Overall appears to be doing relatively well.  Follow    Aortic atherosclerosis (HCC) Assessment & Plan: Continue lipitor.    Coronary artery disease involving native coronary artery of native heart without angina pectoris Assessment & Plan: Saw Dr Juliann Pares 10/23/22 - recommended myoview and echo.  Continue aspirin and atorvastatin.  Myoview overall showed good exercise tolerance walking 7 minutes under the Bruce protocol with no significant symptom development. No evidence of myocardial ischemia by Myoview. Echocardiogram had showed overall normal LV and RV systolic function, EF greater than 55% with moderate TR overall unchanged from 2022  saw Dr Mariah Milling 01/29/23 - stable.  No changes made. Overall doing well.  Feels good.  No chest pain or sob reported. Continue risk factor modification.    Gastroesophageal reflux disease with esophagitis without hemorrhage Assessment & Plan: No upper symptoms reported.  On protonix.     Neuropathy Assessment & Plan: NCS revealed polyneuropathy.  Continue lyrica at current dose. Follow.    SCC (squamous cell carcinoma), face Assessment & Plan: S/p Mohs surgery (nose). Healing well.     Thrombocytopenia (HCC) Assessment & Plan: Recent platelet count wnl.  Follow.    Other orders -     hydroCHLOROthiazide; Take 1 capsule (12.5 mg total) by mouth daily.  Dispense: 90 capsule; Refill: 3 -     Pantoprazole Sodium; Take 2 tablets (80 mg total) by mouth daily. Take 30 minutes before breakfast  Dispense: 180 tablet; Refill: 3     Dale [place], MD

## 2023-02-11 ENCOUNTER — Encounter: Payer: Self-pay | Admitting: Internal Medicine

## 2023-02-11 NOTE — Assessment & Plan Note (Signed)
NCS revealed polyneuropathy.  Continue lyrica at current dose. Follow.  

## 2023-02-11 NOTE — Assessment & Plan Note (Signed)
Recent platelet count wnl.  Follow.   

## 2023-02-11 NOTE — Assessment & Plan Note (Signed)
No upper symptoms reported.  On protonix.   

## 2023-02-11 NOTE — Assessment & Plan Note (Signed)
Has been evaluated by urology previously.  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  No further w/up warranted.   

## 2023-02-11 NOTE — Assessment & Plan Note (Signed)
Continues trazodone at night.  Overall appears to be doing relatively well.  Follow  

## 2023-02-11 NOTE — Assessment & Plan Note (Signed)
S/p Mohs surgery (nose). Healing well.

## 2023-02-11 NOTE — Assessment & Plan Note (Signed)
Saw Dr Juliann Pares 10/23/22 - recommended myoview and echo.  Continue aspirin and atorvastatin.  Myoview overall showed good exercise tolerance walking 7 minutes under the Bruce protocol with no significant symptom development. No evidence of myocardial ischemia by Myoview. Echocardiogram had showed overall normal LV and RV systolic function, EF greater than 55% with moderate TR overall unchanged from 2022  saw Dr Mariah Milling 01/29/23 - stable.  No changes made. Overall doing well.  Feels good.  No chest pain or sob reported. Continue risk factor modification.

## 2023-02-11 NOTE — Assessment & Plan Note (Signed)
Continue lipitor  ?

## 2023-02-11 NOTE — Assessment & Plan Note (Signed)
Had a documented history of abnormal pap smear.  Was followed by gyn. Last pap here 10/06/22 - negative with negative HPV.

## 2023-02-11 NOTE — Assessment & Plan Note (Signed)
Continue lipitor.  Low cholesterol diet and exercise.  Follow lipid panel and liver function tests.   

## 2023-02-11 NOTE — Assessment & Plan Note (Signed)
Currently on coreg, amlodipine, hctz, avapro and aldactone.  Previous abdominal ultrasound - kidneys relatively equal size, etc. Saw nephrology.  No changes made.  Pressure better.  Follow.   

## 2023-02-14 DIAGNOSIS — C44622 Squamous cell carcinoma of skin of right upper limb, including shoulder: Secondary | ICD-10-CM | POA: Diagnosis not present

## 2023-03-13 ENCOUNTER — Encounter: Payer: Self-pay | Admitting: Internal Medicine

## 2023-03-13 ENCOUNTER — Other Ambulatory Visit: Payer: Self-pay | Admitting: Internal Medicine

## 2023-03-13 MED ORDER — PREGABALIN 75 MG PO CAPS: 75.0000 mg | ORAL_CAPSULE | Freq: Two times a day (BID) | ORAL | 2 refills | Status: DC

## 2023-03-13 NOTE — Telephone Encounter (Signed)
Pt requesting refill on lyrica °

## 2023-03-13 NOTE — Telephone Encounter (Signed)
Rx ok'd for lyrica #60 with 2 refills.  

## 2023-03-27 DIAGNOSIS — H2513 Age-related nuclear cataract, bilateral: Secondary | ICD-10-CM | POA: Diagnosis not present

## 2023-03-27 DIAGNOSIS — H43393 Other vitreous opacities, bilateral: Secondary | ICD-10-CM | POA: Diagnosis not present

## 2023-03-27 DIAGNOSIS — M3501 Sicca syndrome with keratoconjunctivitis: Secondary | ICD-10-CM | POA: Diagnosis not present

## 2023-05-23 ENCOUNTER — Ambulatory Visit
Admission: EM | Admit: 2023-05-23 | Discharge: 2023-05-23 | Disposition: A | Discharge status: Home / Self Care | Payer: PPO | Attending: Emergency Medicine | Admitting: Emergency Medicine

## 2023-05-23 DIAGNOSIS — N3000 Acute cystitis without hematuria: Secondary | ICD-10-CM | POA: Insufficient documentation

## 2023-05-23 DIAGNOSIS — R300 Dysuria: Secondary | ICD-10-CM | POA: Diagnosis not present

## 2023-05-23 LAB — POCT URINALYSIS DIP (MANUAL ENTRY)
Bilirubin, UA: NEGATIVE
Blood, UA: NEGATIVE
Glucose, UA: NEGATIVE mg/dL
Ketones, POC UA: NEGATIVE mg/dL
Nitrite, UA: NEGATIVE
Protein Ur, POC: 30 mg/dL — AB
Spec Grav, UA: 1.02 (ref 1.010–1.025)
Urobilinogen, UA: 0.2 U/dL
pH, UA: 7.5 (ref 5.0–8.0)

## 2023-05-23 MED ORDER — CEPHALEXIN 500 MG PO CAPS: 500.0000 mg | ORAL_CAPSULE | Freq: Two times a day (BID) | ORAL | 0 refills | Status: AC

## 2023-05-23 NOTE — Discharge Instructions (Addendum)
Take the antibiotic as directed.  The urine culture is pending.  We will call you if it shows the need to change or discontinue your antibiotic.    Follow up with your primary care provider if your symptoms are not improving.    

## 2023-05-23 NOTE — ED Provider Notes (Signed)
[facility]    CSN: [id number] Arrival date & time: 05/23/23  1016      History   Chief Complaint Chief Complaint  Patient presents with   Urinary Frequency    HPI NARELY DEGRAW is a 72 y.o. female.  Patient presents with 3-day history of dysuria, urinary frequency, urinary urgency.  No fever, abdominal pain, flank pain, vaginal discharge, pelvic pain, or other symptoms.  No OTC medication taken.  Her medical history includes hypertension, heart failure, CAD, CABG, hyperlipidemia.  The history is provided by the patient and medical records.    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)   Heart murmur    History of chicken pox    Hyperlipidemia    Hypertension    Insomnia     Patient Active Problem List   Diagnosis Date Noted   Cough 11/19/2022   Aortic atherosclerosis (HCC) 11/01/2021   Near syncope 05/04/2021   Adrenal mass (HCC) 05/04/2021   Congestive heart failure (HCC) 05/04/2021   Other specified complication of vascular prosthetic devices, implants and grafts, initial encounter (HCC) 05/04/2021   Hypokalemia 11/14/2020   Thrombocytopenia (HCC) 11/14/2020   Neck fullness 07/19/2020   Right arm pain 07/19/2020   B12 deficiency 03/21/2020   Rash 11/09/2019   Hypercalcemia 06/29/2019   Bilateral carotid artery stenosis 06/17/2018   Bilateral leg and foot pain 03/05/2018   Burning sensation of lower extremity 03/05/2018   Neuropathy 02/15/2018   Vaginal atrophy 06/12/2017   Menopause 06/12/2017   Osteopenia 06/12/2017   Heartburn    Chest pain 01/08/2017   Palpitations 11/28/2016   Headache 07/07/2016   Special screening for malignant neoplasms, colon    Benign neoplasm of transverse colon    Family history of breast cancer in first degree relative 10/25/2015   Fibrocystic breast changes of both breasts 10/25/2015   Abnormal vaginal Pap smear 05/18/2015    Anxiety 05/05/2015   History of chicken pox 05/05/2015   HLD (hyperlipidemia) 05/05/2015   Mitral valve disorder 05/05/2015   Osteoarthrosis 05/05/2015   H/O coronary artery bypass surgery 05/05/2015   CAD (coronary artery disease) 03/14/2015   Essential hypertension 03/14/2015   Hypercholesterolemia 03/14/2015   Health care maintenance 03/14/2015   Insomnia 03/08/2015   Esophagitis, reflux 12/11/2014   History of atrial fibrillation 06/29/2014   Arteriosclerosis of bypass graft of coronary artery 06/01/2014   Billowing mitral valve 06/01/2014   SCC (squamous cell carcinoma), 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   Adrenal benign neoplasm 12/31/2012    Past Surgical History:  Procedure Laterality Date   APPENDECTOMY     BREAST EXCISIONAL BIOPSY Right    negative over 5 years ago- neg   BREAST SURGERY     Biopsy   COLONOSCOPY WITH PROPOFOL N/A 05/16/2016   Procedure: COLONOSCOPY WITH PROPOFOL;  Surgeon: Midge Minium, MD;  Location: [facility];  Service: Endoscopy;  Laterality: N/A;   CORONARY ARTERY BYPASS GRAFT  05/01/2014   3 vessel, [facility]   ESOPHAGOGASTRODUODENOSCOPY (EGD) WITH PROPOFOL N/A 02/23/2017   Procedure: ESOPHAGOGASTRODUODENOSCOPY (EGD) WITH PROPOFOL;  Surgeon: Midge Minium, MD;  Location[facility];  Service: Endoscopy;  Laterality: N/A;   HYSTEROSCOPY WITH D & C     lipoma removed     removed from forehead   triple bypass      OB History     [place]  1   Para  1   Term  1   Preterm      AB      Living  1      SAB      IAB      Ectopic      Multiple      Live Births  1            Home Medications    Prior to Admission medications   Medication Sig Start Date End Date Taking? Authorizing Provider  cephALEXin (KEFLEX) 500 MG capsule Take 1 capsule (500 mg total) by mouth 2 (two) times daily for 5 days. 05/23/23 05/28/23 Yes Mickie Bail, NP  Alpha-Lipoic Acid 100 MG CAPS Take 600 mg by mouth daily.    [provider]   amLODipine (NORVASC) 10 MG tablet Take 10 mg by mouth daily. 03/08/15   [provider]  ascorbic acid (VITAMIN C) 1000 MG tablet Take by mouth.    [provider]  aspirin EC 81 MG tablet Take 81 mg by mouth daily. 10/04/12   [provider]  atorvastatin (LIPITOR) 40 MG tablet Take by mouth. 03/22/20 01/29/23  [provider]  CALCIUM MAGNESIUM ZINC PO Take by mouth daily.    [provider]  carvedilol (COREG) 6.25 MG tablet Take 6.25 mg by mouth 2 (two) times daily. 02/11/20   [provider]  Cholecalciferol (VITAMIN D3) 1000 units CAPS 1 cap(s) Orally Once a day    [provider]  cyanocobalamin 1000 MCG tablet Take 1,000 mcg by mouth daily.    [provider]  Docusate Calcium (STOOL SOFTENER PO) Take by mouth.    [provider]  docusate sodium (COLACE) 100 MG capsule 1 capsule as needed Orally BID    [provider]  hydrochlorothiazide (MICROZIDE) 12.5 MG capsule Take 1 capsule (12.5 mg total) by mouth daily. 02/09/23   Dale [place], MD  irbesartan (AVAPRO) 300 MG tablet Take 1 tablet (300 mg total) by mouth daily. 11/14/22   Dale [place], MD  Omega-3 Fatty Acids (FISH OIL) 1000 MG CAPS Take 1,200 mg by mouth daily.    [provider]  pantoprazole (PROTONIX) 40 MG tablet Take 2 tablets (80 mg total) by mouth daily. Take 30 minutes before breakfast 02/09/23   Dale [place], MD  pregabalin (LYRICA) 75 MG capsule Take 1 capsule (75 mg total) by mouth 2 (two) times daily. 03/13/23   Dale [place], MD  Probiotic Product (PROBIOTIC PO) Take by mouth.    [provider]  spironolactone (ALDACTONE) 25 MG tablet Take 25 mg by mouth daily. 11/09/20   [provider]  traZODone (DESYREL) 50 MG tablet Take 1-2 tablets (50-100 mg total) by mouth at bedtime as needed. 11/14/22   Dale [place], MD    Family History Family History  Problem Relation Age of Onset   Breast cancer Other         maternal great aunt   Heart disease Other        multiple family members   Heart disease Mother    Stroke Mother    Alzheimer's disease Mother    Heart disease Father    Stroke Father    Breast cancer Sister 32   Alzheimer's disease Maternal Aunt    Alzheimer's disease Maternal Uncle    Alzheimer's disease Maternal Grandmother    Colon cancer Neg Hx    Diabetes Neg Hx    Ovarian cancer Neg Hx  Social History Social History   Tobacco Use   Smoking status: Never   Smokeless tobacco: Never  Vaping Use   Vaping status: Never Used  Substance Use Topics   Alcohol use: Never    Alcohol/week: 0.0 standard drinks of alcohol   Drug use: No     Allergies   Valsartan and Hydralazine   Review of Systems Review of Systems  Constitutional:  Negative for chills and fever.  Gastrointestinal:  Negative for abdominal pain, nausea and vomiting.  Genitourinary:  Positive for dysuria, frequency and urgency. Negative for flank pain, pelvic pain and vaginal discharge.     Physical Exam Triage Vital Signs ED Triage Vitals  Encounter Vitals Group     BP 05/23/23 1056 126/71     Systolic BP Percentile --      Diastolic BP Percentile --      Pulse Rate 05/23/23 1056 68     Resp 05/23/23 1056 18     Temp 05/23/23 1056 97.9 F (36.6 C)     Temp src --      SpO2 05/23/23 1056 98 %     Weight --      Height --      Head Circumference --      Peak Flow --      Pain Score 05/23/23 1049 2     Pain Loc --      Pain Education --      Exclude from Growth Chart --    No data found.  Updated Vital Signs BP 126/71   Pulse 68   Temp 97.9 F (36.6 C)   Resp 18   SpO2 98%   Visual Acuity Right Eye Distance:   Left Eye Distance:   Bilateral Distance:    Right Eye Near:   Left Eye Near:    Bilateral Near:     Physical Exam Constitutional:      General: She is not in acute distress. HENT:     Mouth/Throat:     Mouth: Mucous membranes are moist.  Cardiovascular:      Rate and Rhythm: Normal rate and regular rhythm.     Heart sounds: Normal heart sounds.  Pulmonary:     Effort: Pulmonary effort is normal. No respiratory distress.     Breath sounds: Normal breath sounds.  Abdominal:     General: Bowel sounds are normal.     Palpations: Abdomen is soft.     Tenderness: There is no abdominal tenderness. There is no right CVA tenderness, left CVA tenderness, guarding or rebound.  Neurological:     Mental Status: She is alert.  Psychiatric:        Mood and Affect: Mood normal.        Behavior: Behavior normal.      UC Treatments / Results  Labs (all labs ordered are listed, but only abnormal results are displayed) Labs Reviewed  POCT URINALYSIS DIP (MANUAL ENTRY) - Abnormal; Notable for the following components:      Result Value   Clarity, UA cloudy (*)    Protein Ur, POC =30 (*)    Leukocytes, UA Moderate (2+) (*)    All other components within normal limits  URINE CULTURE    EKG   Radiology No results found.  Procedures Procedures (including critical care time)  Medications Ordered in UC Medications - No data to display  Initial Impression / Assessment and Plan / UC Course  I have reviewed the triage vital signs and  the nursing notes.  Pertinent labs & imaging results that were available during my care of the patient were reviewed by me and considered in my medical decision making (see chart for details).    Dysuria, acute cystitis.  Treating with Keflex. Urine culture pending. Discussed with patient that we will call her if the urine culture shows the need to change or discontinue the antibiotic. Instructed her to follow-up with her PCP if her symptoms are not improving. Patient agrees to plan of care.     Final Clinical Impressions(s) / UC Diagnoses   Final diagnoses:  Dysuria  Acute cystitis without hematuria     Discharge Instructions      Take the antibiotic as directed.  The urine culture is pending.  We will  call you if it shows the need to change or discontinue your antibiotic.    Follow up with your primary care provider if your symptoms are not improving.        ED Prescriptions     Medication Sig Dispense Auth. Provider   cephALEXin (KEFLEX) 500 MG capsule Take 1 capsule (500 mg total) by mouth 2 (two) times daily for 5 days. 10 capsule Mickie Bail, NP      PDMP not reviewed this encounter.   Mickie Bail, NP 05/23/23 1110

## 2023-05-23 NOTE — ED Triage Notes (Signed)
Patient to Urgent Care with complaints of urinary pressure/ frequency/ irritation. Denies any known fevers.   Reports symptoms started three days ago. Having to get up multiple times during the night to pee. Chills during the night.

## 2023-05-25 LAB — URINE CULTURE: Culture: 70000 — AB

## 2023-06-11 ENCOUNTER — Ambulatory Visit
Admission: RE | Admit: 2023-06-11 | Discharge: 2023-06-11 | Disposition: A | Discharge status: Home / Self Care | Payer: PPO | Source: Ambulatory Visit | Attending: Internal Medicine | Admitting: Internal Medicine

## 2023-06-11 ENCOUNTER — Other Ambulatory Visit (INDEPENDENT_AMBULATORY_CARE_PROVIDER_SITE_OTHER): Payer: PPO

## 2023-06-11 DIAGNOSIS — I10 Essential (primary) hypertension: Secondary | ICD-10-CM | POA: Diagnosis not present

## 2023-06-11 DIAGNOSIS — Z1231 Encounter for screening mammogram for malignant neoplasm of breast: Secondary | ICD-10-CM | POA: Diagnosis not present

## 2023-06-11 DIAGNOSIS — E7800 Pure hypercholesterolemia, unspecified: Secondary | ICD-10-CM

## 2023-06-11 LAB — LIPID PANEL
Cholesterol: 94 mg/dL (ref 0–200)
HDL: 38.4 mg/dL — ABNORMAL LOW (ref >39.00)
LDL Cholesterol: 39 mg/dL (ref 0–99)
NonHDL: 55.26
Total CHOL/HDL Ratio: 2
Triglycerides: 82 mg/dL (ref 0.0–149.0)
VLDL: 16.4 mg/dL (ref 0.0–40.0)

## 2023-06-11 LAB — HEPATIC FUNCTION PANEL
ALT: 17 U/L (ref 0–35)
AST: 16 U/L (ref 0–37)
Albumin: 4.2 g/dL (ref 3.5–5.2)
Alkaline Phosphatase: 34 U/L — ABNORMAL LOW (ref 39–117)
Bilirubin, Direct: 0.1 mg/dL (ref 0.0–0.3)
Total Bilirubin: 0.7 mg/dL (ref 0.2–1.2)
Total Protein: 6.7 g/dL (ref 6.0–8.3)

## 2023-06-11 LAB — BASIC METABOLIC PANEL
BUN: 17 mg/dL (ref 6–23)
CO2: 29 meq/L (ref 19–32)
Calcium: 10.3 mg/dL (ref 8.4–10.5)
Chloride: 103 meq/L (ref 96–112)
Creatinine, Ser: 0.96 mg/dL (ref 0.40–1.20)
GFR: 59.3 mL/min — ABNORMAL LOW (ref >60.00)
Glucose, Bld: 86 mg/dL (ref 70–99)
Potassium: 4.5 meq/L (ref 3.5–5.1)
Sodium: 139 meq/L (ref 135–145)

## 2023-06-11 LAB — TSH: TSH: 3.34 u[IU]/mL (ref 0.35–5.50)

## 2023-06-13 ENCOUNTER — Ambulatory Visit: Payer: PPO | Admitting: Internal Medicine

## 2023-06-13 ENCOUNTER — Encounter: Payer: Self-pay | Admitting: Internal Medicine

## 2023-06-13 VITALS — BP 112/68 | HR 63 | Temp 97.8°F | Resp 16 | Ht 69.0 in | Wt 173.0 lb

## 2023-06-13 DIAGNOSIS — D696 Thrombocytopenia, unspecified: Secondary | ICD-10-CM

## 2023-06-13 DIAGNOSIS — E7800 Pure hypercholesterolemia, unspecified: Secondary | ICD-10-CM | POA: Diagnosis not present

## 2023-06-13 DIAGNOSIS — I2510 Atherosclerotic heart disease of native coronary artery without angina pectoris: Secondary | ICD-10-CM | POA: Diagnosis not present

## 2023-06-13 DIAGNOSIS — I10 Essential (primary) hypertension: Secondary | ICD-10-CM | POA: Diagnosis not present

## 2023-06-13 DIAGNOSIS — E278 Other specified disorders of adrenal gland: Secondary | ICD-10-CM

## 2023-06-13 DIAGNOSIS — I700 Atherosclerosis of aorta: Secondary | ICD-10-CM

## 2023-06-13 DIAGNOSIS — Z23 Encounter for immunization: Secondary | ICD-10-CM | POA: Diagnosis not present

## 2023-06-13 DIAGNOSIS — F419 Anxiety disorder, unspecified: Secondary | ICD-10-CM | POA: Diagnosis not present

## 2023-06-13 DIAGNOSIS — G629 Polyneuropathy, unspecified: Secondary | ICD-10-CM | POA: Diagnosis not present

## 2023-06-13 MED ORDER — PREGABALIN 75 MG PO CAPS: 75.0000 mg | ORAL_CAPSULE | Freq: Two times a day (BID) | ORAL | 2 refills | Status: DC

## 2023-06-13 NOTE — Assessment & Plan Note (Addendum)
Follow cbc.  

## 2023-06-13 NOTE — Assessment & Plan Note (Signed)
Continues trazodone at night.  Overall appears to be doing relatively well.  Follow  

## 2023-06-13 NOTE — Assessment & Plan Note (Signed)
Continue lipitor  ?

## 2023-06-13 NOTE — Assessment & Plan Note (Signed)
Saw Dr Juliann Pares 10/23/22 - recommended myoview and echo.  Continue aspirin and atorvastatin.  Myoview overall showed good exercise tolerance walking 7 minutes under the Bruce protocol with no significant symptom development. No evidence of myocardial ischemia by Myoview. Echocardiogram had showed overall normal LV and RV systolic function, EF greater than 55% with moderate TR overall unchanged from 2022  saw Dr Mariah Milling 01/29/23 - stable.  No changes made. Overall doing well.  Feels good.  No chest pain or sob reported. Continue risk factor modification.

## 2023-06-13 NOTE — Assessment & Plan Note (Signed)
Currently on coreg, amlodipine, hctz, avapro and aldactone.  Previous abdominal ultrasound - kidneys relatively equal size, etc. Saw nephrology.  No changes made.  Pressure better.  Follow.

## 2023-06-13 NOTE — Progress Notes (Signed)
Subjective:    [name], female    DOB: 04/23/1951, 72 y.o.   MRN: [id number]  Patient here for  Chief Complaint  Patient presents with   Medical Management of Chronic Issues    HPI Here to follow up regarding hypercholesterolemia, CAD and hypertension.  Saw Dr Juliann Pares 10/23/22 - recommended myoview and echo.  Continue aspirin and atorvastatin.  Myoview overall showed good exercise tolerance walking 7 minutes under the Bruce protocol with no significant symptom development. No evidence of myocardial ischemia by Myoview. Echocardiogram had showed overall normal LV and RV systolic function, EF greater than 55% with moderate TR overall unchanged from 2022  saw Dr Mariah Milling 01/29/23 - stable.  She feels she is doing well.  Tries to stay active.  No chest pain or sob reported.  No abdominal pain or bowel change reported.  Some increased stress/worry.  Discussed.  Overall she feels she is handling things relatively well.  Had f/u with dermatology 06/26/23. Symptoms controlled on lyrica. Disucssed labs.    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)   Heart murmur    History of chicken pox    Hyperlipidemia    Hypertension    Insomnia    Past Surgical History:  Procedure Laterality Date   APPENDECTOMY     BREAST EXCISIONAL BIOPSY Right    negative over 5 years ago- neg   BREAST SURGERY     Biopsy   COLONOSCOPY WITH PROPOFOL N/A 05/16/2016   Procedure: COLONOSCOPY WITH PROPOFOL;  Surgeon: Midge Minium, MD;  Location: [facility];  Service: Endoscopy;  Laterality: N/A;   CORONARY ARTERY BYPASS GRAFT  05/01/2014   3 vessel, [facility]   ESOPHAGOGASTRODUODENOSCOPY (EGD) WITH PROPOFOL N/A 02/23/2017   Procedure: ESOPHAGOGASTRODUODENOSCOPY (EGD) WITH PROPOFOL;  Surgeon: Midge Minium, MD;  Location: [facility];  Service: Endoscopy;  Laterality: N/A;   HYSTEROSCOPY WITH D & C      lipoma removed     removed from forehead   triple bypass     Family History  Problem Relation Age of Onset   Breast cancer Other        maternal great aunt   Heart disease Other        multiple family members   Heart disease Mother    Stroke Mother    Alzheimer's disease Mother    Heart disease Father    Stroke Father    Breast cancer Sister 67   Alzheimer's disease Maternal Aunt    Alzheimer's disease Maternal Uncle    Alzheimer's disease Maternal Grandmother    Colon cancer Neg Hx    Diabetes Neg Hx    Ovarian cancer Neg Hx    Social History   Socioeconomic History   Marital status: Married    Spouse name: Not on file   Number of children: Not on file   Years of education: Not on file   Highest education level: Not on file  Occupational History   Not on file  Tobacco Use   Smoking status: Never   Smokeless tobacco: Never  Vaping Use   Vaping status: Never Used  Substance and Sexual Activity   Alcohol use: Never    Alcohol/week: 0.0 standard drinks of alcohol   Drug use: No   Sexual activity: Yes    Birth control/protection: Post-menopausal  Other Topics  Concern   Not on file  Social History Narrative   Not on file   Social Determinants of Health   Financial Resource Strain: Low Risk  (10/05/2022)   Overall Financial Resource Strain (CARDIA)    Difficulty of Paying Living Expenses: Not hard at all  Food Insecurity: No Food Insecurity (10/05/2022)   Hunger Vital Sign    Worried About Running Out of Food in the Last Year: Never true    Ran Out of Food in the Last Year: Never true  Transportation Needs: No Transportation Needs (10/05/2022)   PRAPARE - Administrator, Civil Service (Medical): No    Lack of Transportation (Non-Medical): No  Physical Activity: Sufficiently Active (10/05/2022)   Exercise Vital Sign    Days of Exercise per Week: 3 days    Minutes of Exercise per Session: 50 min  Stress: No Stress Concern Present (10/05/2022)    [employer] of Occupational Health - Occupational Stress Questionnaire    Feeling of Stress : Not at all  Social Connections: Unknown (10/05/2022)   Social Connection and Isolation Panel [NHANES]    Frequency of Communication with Friends and Family: More than three times a week    Frequency of Social Gatherings with Friends and Family: More than three times a week    Attends Religious Services: More than 4 times per year    Active Member of [employer] or Organizations: Yes    Attends Engineer, structural: More than 4 times per year    Marital Status: Not on file     Review of Systems  Constitutional:  Negative for appetite change and unexpected weight change.  HENT:  Negative for congestion and sinus pressure.   Respiratory:  Negative for cough, chest tightness and shortness of breath.   Cardiovascular:  Negative for chest pain, palpitations and leg swelling.  Gastrointestinal:  Negative for abdominal pain, diarrhea, nausea and vomiting.  Genitourinary:  Negative for difficulty urinating and dysuria.  Musculoskeletal:  Negative for joint swelling and myalgias.  Skin:  Negative for color change and rash.  Neurological:  Negative for dizziness and headaches.  Psychiatric/Behavioral:  Negative for agitation and dysphoric mood.        Objective:     BP 112/68   Pulse 63   Temp 97.8 F (36.6 C)   Resp 16   Ht 5\' 9"  (1.753 m)   Wt 173 lb (78.5 kg)   SpO2 98%   BMI 25.55 kg/m  Wt Readings from Last 3 Encounters:  06/13/23 173 lb (78.5 kg)  02/09/23 171 lb 9.6 oz (77.8 kg)  01/29/23 172 lb (78 kg)    Physical Exam Vitals reviewed.  Constitutional:      General: She is not in acute distress.    Appearance: Normal appearance.  HENT:     Head: Normocephalic and atraumatic.     Right Ear: External ear normal.     Left Ear: External ear normal.  Eyes:     General: No scleral icterus.       Right eye: No discharge.        Left eye: No discharge.      Conjunctiva/sclera: Conjunctivae normal.  Neck:     Thyroid: No thyromegaly.  Cardiovascular:     Rate and Rhythm: Normal rate and regular rhythm.  Pulmonary:     Effort: No respiratory distress.     Breath sounds: Normal breath sounds. No wheezing.  Abdominal:     General: Bowel sounds  are normal.     Palpations: Abdomen is soft.     Tenderness: There is no abdominal tenderness.  Musculoskeletal:        General: No swelling or tenderness.     Cervical back: Neck supple. No tenderness.  Lymphadenopathy:     Cervical: No cervical adenopathy.  Skin:    Findings: No erythema or rash.  Neurological:     Mental Status: She is alert.  Psychiatric:        Mood and Affect: Mood normal.        Behavior: Behavior normal.      Outpatient Encounter Medications as of 06/13/2023  Medication Sig   Alpha-Lipoic Acid 100 MG CAPS Take 600 mg by mouth daily.   amLODipine (NORVASC) 10 MG tablet Take 10 mg by mouth daily.   ascorbic acid (VITAMIN C) 1000 MG tablet Take by mouth.   aspirin EC 81 MG tablet Take 81 mg by mouth daily.   atorvastatin (LIPITOR) 40 MG tablet Take by mouth.   CALCIUM MAGNESIUM ZINC PO Take by mouth daily.   carvedilol (COREG) 6.25 MG tablet Take 6.25 mg by mouth 2 (two) times daily.   Cholecalciferol (VITAMIN D3) 1000 units CAPS 1 cap(s) Orally Once a day   cyanocobalamin 1000 MCG tablet Take 1,000 mcg by mouth daily.   Docusate Calcium (STOOL SOFTENER PO) Take by mouth.   docusate sodium (COLACE) 100 MG capsule 1 capsule as needed Orally BID   hydrochlorothiazide (MICROZIDE) 12.5 MG capsule Take 1 capsule (12.5 mg total) by mouth daily.   irbesartan (AVAPRO) 300 MG tablet Take 1 tablet (300 mg total) by mouth daily.   Omega-3 Fatty Acids (FISH OIL) 1000 MG CAPS Take 1,200 mg by mouth daily.   pantoprazole (PROTONIX) 40 MG tablet Take 2 tablets (80 mg total) by mouth daily. Take 30 minutes before breakfast   pregabalin (LYRICA) 75 MG capsule Take 1 capsule (75 mg  total) by mouth 2 (two) times daily.   Probiotic Product (PROBIOTIC PO) Take by mouth.   spironolactone (ALDACTONE) 25 MG tablet Take 25 mg by mouth daily.   traZODone (DESYREL) 50 MG tablet Take 1-2 tablets (50-100 mg total) by mouth at bedtime as needed.   [DISCONTINUED] pregabalin (LYRICA) 75 MG capsule Take 1 capsule (75 mg total) by mouth 2 (two) times daily.   No facility-administered encounter medications on file as of 06/13/2023.     Lab Results  Component Value Date   WBC 6.2 02/06/2023   HGB 12.4 02/06/2023   HCT 37.3 02/06/2023   PLT 190.0 02/06/2023   GLUCOSE 86 06/11/2023   CHOL 94 06/11/2023   TRIG 82.0 06/11/2023   HDL 38.40 (L) 06/11/2023   LDLCALC 39 06/11/2023   ALT 17 06/11/2023   AST 16 06/11/2023   NA 139 06/11/2023   K 4.5 06/11/2023   CL 103 06/11/2023   CREATININE 0.96 06/11/2023   BUN 17 06/11/2023   CO2 29 06/11/2023   TSH 3.34 06/11/2023    MM 3D SCREENING MAMMOGRAM BILATERAL BREAST  Result Date: 06/12/2023 CLINICAL DATA:  Screening. EXAM: DIGITAL SCREENING BILATERAL MAMMOGRAM WITH TOMOSYNTHESIS AND CAD TECHNIQUE: Bilateral screening digital craniocaudal and mediolateral oblique mammograms were obtained. Bilateral screening digital breast tomosynthesis was performed. The images were evaluated with computer-aided detection. COMPARISON:  Previous exam(s). ACR Breast Density Category b: There are scattered areas of fibroglandular density. FINDINGS: There are no findings suspicious for malignancy. IMPRESSION: No mammographic evidence of malignancy. A result letter of this screening mammogram  will be mailed directly to the patient. RECOMMENDATION: Screening mammogram in one year. (Code:SM-B-01Y) BI-RADS CATEGORY  1: Negative. Electronically Signed   By: Edwin Cap M.D.   On: 06/12/2023 08:25       Assessment & Plan:  Need for influenza vaccination -     Flu Vaccine Trivalent High Dose (Fluad)  Hypercholesterolemia Assessment & Plan: Continue  lipitor.  Low cholesterol diet and exercise.  Follow lipid panel and liver function tests.   Orders: -     Lipid panel; Future -     Hepatic function panel; Future -     Basic metabolic panel; Future  Thrombocytopenia (HCC) Assessment & Plan: Follow cbc.    Neuropathy Assessment & Plan: NCS revealed polyneuropathy.  Continue lyrica at current dose.  Stable.  Follow.    Essential hypertension Assessment & Plan: Currently on coreg, amlodipine, hctz, avapro and aldactone.  Previous abdominal ultrasound - kidneys relatively equal size, etc. Saw nephrology.  No changes made.  Pressure better.  Follow.     Coronary artery disease involving native coronary artery of native heart without angina pectoris Assessment & Plan: Saw Dr Juliann Pares 10/23/22 - recommended myoview and echo.  Continue aspirin and atorvastatin.  Myoview overall showed good exercise tolerance walking 7 minutes under the Bruce protocol with no significant symptom development. No evidence of myocardial ischemia by Myoview. Echocardiogram had showed overall normal LV and RV systolic function, EF greater than 55% with moderate TR overall unchanged from 2022  saw Dr Mariah Milling 01/29/23 - stable.  No changes made. Overall doing well.  Feels good.  No chest pain or sob reported. Continue risk factor modification.    Aortic atherosclerosis (HCC) Assessment & Plan: Continue lipitor.    Anxiety Assessment & Plan: Continues trazodone at night.  Overall appears to be doing relatively well.  Follow    Adrenal mass [facility]) Assessment & Plan: Has been evaluated by urology previously. 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 No further w/up warranted.    Other orders -     Pregabalin; Take 1 capsule (75 mg total) by mouth 2 (two) times daily.  Dispense: 60 capsule; Refill: 2     Dale Willow, MD

## 2023-06-13 NOTE — Assessment & Plan Note (Signed)
NCS revealed polyneuropathy.  Continue lyrica at current dose.  Stable.  Follow.

## 2023-06-13 NOTE — Assessment & Plan Note (Signed)
Continue lipitor.  Low cholesterol diet and exercise.  Follow lipid panel and liver function tests.   

## 2023-06-13 NOTE — Assessment & Plan Note (Signed)
Has been evaluated by urology previously.  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  No further w/up warranted.   

## 2023-06-17 ENCOUNTER — Encounter: Payer: Self-pay | Admitting: Internal Medicine

## 2023-07-06 DIAGNOSIS — D2262 Melanocytic nevi of left upper limb, including shoulder: Secondary | ICD-10-CM | POA: Diagnosis not present

## 2023-07-06 DIAGNOSIS — D2271 Melanocytic nevi of right lower limb, including hip: Secondary | ICD-10-CM | POA: Diagnosis not present

## 2023-07-06 DIAGNOSIS — D2272 Melanocytic nevi of left lower limb, including hip: Secondary | ICD-10-CM | POA: Diagnosis not present

## 2023-07-06 DIAGNOSIS — Z85828 Personal history of other malignant neoplasm of skin: Secondary | ICD-10-CM | POA: Diagnosis not present

## 2023-07-06 DIAGNOSIS — D2261 Melanocytic nevi of right upper limb, including shoulder: Secondary | ICD-10-CM | POA: Diagnosis not present

## 2023-07-06 DIAGNOSIS — L728 Other follicular cysts of the skin and subcutaneous tissue: Secondary | ICD-10-CM | POA: Diagnosis not present

## 2023-07-06 DIAGNOSIS — D3614 Benign neoplasm of peripheral nerves and autonomic nervous system of thorax: Secondary | ICD-10-CM | POA: Diagnosis not present

## 2023-08-05 NOTE — Progress Notes (Unsigned)
Cardiology Office Note  Date:  08/06/2023   ID:  Alexandra Mendoza, DOB 06-16-1951, MRN [id number]  PCP:  Dale Haledon, MD   Chief Complaint  Patient presents with   6 month follow up     Patient c/o more indigestion lately. Medications reviewed by the patient verbally.     HPI:  Alexandra Mendoza is a 72 year old woman with past medical history of coronary artery disease  s/p CABG with LIMA to LAD, SVG to ramus and OM1 in 2015,  hyperlipidemia,  hypertension,  PAD bilateral carotid artery stenosis less than 50% bilaterally 2019.  Who presents for follow up of her coronary artery disease and PAD  Last seen by myself in clinic May 2024  GERD sx recently, went up to pantoprazole 80 daily , taking 40 twice daily Seemed to help some Went back to one a day, GERD symptoms came back, went back to twice daily dosing Occasionally has to take Mylanta Wants to make sure it is not her heart causing symptoms  Active, helping husband who had recent knee surgery Arms are sore from carrying ice coolers and putting on compression hose  Denies PND orthopnea, no typical chest pain on exertion concerning for angina  Lab work reviewed A1c 5.4 Cholesterol 94, LDL 39 Creatinine 0.96  EKG personally reviewed by myself on todays visit EKG Interpretation Date/Time:  Monday August 06 2023 10:42:21 EST Ventricular Rate:  58 PR Interval:  152 QRS Duration:  88 QT Interval:  426 QTC Calculation: 418 R Axis:   33  Text Interpretation: Sinus bradycardia When compared with ECG of 08-Oct-2020 19:45, No significant change was found Confirmed by Julien Nordmann [phone]) on 08/06/2023 11:07:44 AM   Prior cardiac history [place] symptoms of feeling unwell, workup with stress test leading to cardiac catheterization detailing coronary blockages  Echo February 2024, outside study NORMAL LEFT VENTRICULAR SYSTOLIC FUNCTION NORMAL RIGHT VENTRICULAR SYSTOLIC  FUNCTION MODERATE VALVULAR REGURGITATION, tricuspid valve NO VALVULAR STENOSIS ESTIMATED LVEF >55%  Right ventricular systolic pressure 26+ right atrial pressure  Treadmill stress test Myoview February 2024, No ischemia, EF 65%  EKG personally reviewed by myself on todays visit EKG Interpretation Date/Time:  Monday August 06 2023 10:42:21 EST Ventricular Rate:  58 PR Interval:  152 QRS Duration:  88 QT Interval:  426 QTC Calculation: 418 R Axis:   33  Text Interpretation: Sinus bradycardia When compared with ECG of 08-Oct-2020 19:45, No significant change was found Confirmed by Julien Nordmann [phone]) on 08/06/2023 11:07:44 AM     PMH:   has a past medical history of Acid reflux, Coronary artery disease, Heart disease, Heart murmur, History of chicken pox, Hyperlipidemia, Hypertension, and Insomnia.  PSH:    Past Surgical History:  Procedure Laterality Date   APPENDECTOMY     BREAST EXCISIONAL BIOPSY Right    negative over 5 years ago- neg   BREAST SURGERY     Biopsy   COLONOSCOPY WITH PROPOFOL N/A 05/16/2016   Procedure: COLONOSCOPY WITH PROPOFOL;  Surgeon: Midge Minium, MD;  Location: [facility];  Service: Endoscopy;  Laterality: N/A;   CORONARY ARTERY BYPASS GRAFT  05/01/2014   3 vessel, [facility]   ESOPHAGOGASTRODUODENOSCOPY (EGD) WITH PROPOFOL N/A 02/23/2017   Procedure: ESOPHAGOGASTRODUODENOSCOPY (EGD) WITH PROPOFOL;  Surgeon: Midge Minium, MD;  Location: [facility];  Service: Endoscopy;  Laterality: N/A;   HYSTEROSCOPY WITH D & C     lipoma removed  removed from forehead   triple bypass      Current Outpatient Medications  Medication Sig Dispense Refill   Alpha-Lipoic Acid 100 MG CAPS Take 600 mg by mouth daily.     amLODipine (NORVASC) 10 MG tablet Take 10 mg by mouth daily.  4   ascorbic acid (VITAMIN C) 1000 MG tablet Take by mouth.     aspirin EC 81 MG tablet Take 81 mg by mouth daily.     atorvastatin (LIPITOR) 40 MG tablet Take  by mouth.     CALCIUM MAGNESIUM ZINC PO Take by mouth daily.     carvedilol (COREG) 6.25 MG tablet Take 6.25 mg by mouth 2 (two) times daily.     Cholecalciferol (VITAMIN D3) 1000 units CAPS 1 cap(s) Orally Once a day     cyanocobalamin 1000 MCG tablet Take 1,000 mcg by mouth daily.     Docusate Calcium (STOOL SOFTENER PO) Take by mouth.     docusate sodium (COLACE) 100 MG capsule 1 capsule as needed Orally BID     hydrochlorothiazide (MICROZIDE) 12.5 MG capsule Take 1 capsule (12.5 mg total) by mouth daily. 90 capsule 3   irbesartan (AVAPRO) 300 MG tablet Take 1 tablet (300 mg total) by mouth daily. 90 tablet 3   Omega-3 Fatty Acids (FISH OIL) 1000 MG CAPS Take 1,200 mg by mouth daily.     pantoprazole (PROTONIX) 40 MG tablet Take 2 tablets (80 mg total) by mouth daily. Take 30 minutes before breakfast 180 tablet 3   pregabalin (LYRICA) 75 MG capsule Take 1 capsule (75 mg total) by mouth 2 (two) times daily. 60 capsule 2   Probiotic Product (PROBIOTIC PO) Take by mouth.     spironolactone (ALDACTONE) 25 MG tablet Take 25 mg by mouth daily.     traZODone (DESYREL) 50 MG tablet Take 1-2 tablets (50-100 mg total) by mouth at bedtime as needed. 180 tablet 1   No current facility-administered medications for this visit.    Allergies:   Valsartan and Hydralazine   Social History:  The patient  reports that she has never smoked. She has never used smokeless tobacco. She reports that she does not drink alcohol and does not use drugs.   Family History:   family history includes Alzheimer's disease in her maternal aunt, maternal grandmother, maternal uncle, and mother; Breast cancer in an other family member; Breast cancer (age of onset: 18) in her sister; Heart disease in her father, mother, and another family member; Stroke in her father and mother.    Review of Systems: Review of Systems  Constitutional: Negative.   HENT: Negative.    Respiratory: Negative.    Cardiovascular: Negative.    Gastrointestinal: Negative.   Musculoskeletal: Negative.   Neurological: Negative.   Psychiatric/Behavioral: Negative.    All other systems reviewed and are negative.    PHYSICAL EXAM: VS:  BP 130/66 (BP Location: Left Arm, Patient Position: Sitting, Cuff Size: Normal)   Pulse (!) 58   Ht 5\' 10"  (1.778 m)   Wt 171 lb 4 oz (77.7 kg)   SpO2 98%   BMI 24.57 kg/m  , BMI Body mass index is 24.57 kg/m. Constitutional:  oriented to person, place, and time. No distress.  HENT:  Head: Grossly normal Eyes:  no discharge. No scleral icterus.  Neck: No JVD, no carotid bruits  Cardiovascular: Regular rate and rhythm, no murmurs appreciated Pulmonary/Chest: Clear to auscultation bilaterally, no wheezes or rails Abdominal: Soft.  no distension.  no  tenderness.  Musculoskeletal: Normal range of motion Neurological:  normal muscle tone. Coordination normal. No atrophy Skin: Skin warm and dry Psychiatric: normal affect, pleasant  Recent Labs: 02/06/2023: Hemoglobin 12.4; Platelets 190.0 06/11/2023: ALT 17; BUN 17; Creatinine, Ser 0.96; Potassium 4.5; Sodium 139; TSH 3.34    Lipid Panel Lab Results  Component Value Date   CHOL 94 06/11/2023   HDL 38.40 (L) 06/11/2023   LDLCALC 39 06/11/2023   TRIG 82.0 06/11/2023      Wt Readings from Last 3 Encounters:  08/06/23 171 lb 4 oz (77.7 kg)  06/13/23 173 lb (78.5 kg)  02/09/23 171 lb 9.6 oz (77.8 kg)     ASSESSMENT AND PLAN:  Problem List Items Addressed This Visit       Cardiology Problems   CAD (coronary artery disease) - Primary   Relevant Orders   EKG 12-Lead (Completed)   Essential hypertension   Relevant Orders   EKG 12-Lead (Completed)   Aortic atherosclerosis (HCC)   Relevant Orders   EKG 12-Lead (Completed)   Mitral valve disorder   Relevant Orders   EKG 12-Lead (Completed)   HLD (hyperlipidemia)    Coronary artery disease with stable angina Currently with no symptoms of angina. No further workup at this time.  Continue current medication regimen.  Hyperlipidemia Cholesterol is at goal on the current lipid regimen. No changes to the medications were made.  Essential hypertension Blood pressure is well controlled on today's visit. No changes made to the medications.  GERD Recommend she try Pepcid once or twice a day as needed with her PPI For refractory symptoms concerning for angina, could consider Myoview  Aortic atherosclerosis Cholesterol at goal No prior smoking, no significant diabetes Cholesterol at goal    Signed, Dossie Arbour, M.D., Ph.D. [facility] [place], [place] [phone]

## 2023-08-06 ENCOUNTER — Encounter: Payer: Self-pay | Admitting: Cardiovascular Disease

## 2023-08-06 ENCOUNTER — Ambulatory Visit: Payer: PPO | Attending: Cardiovascular Disease | Admitting: Cardiovascular Disease

## 2023-08-06 VITALS — BP 130/66 | HR 58 | Ht 70.0 in | Wt 171.2 lb

## 2023-08-06 DIAGNOSIS — I10 Essential (primary) hypertension: Secondary | ICD-10-CM

## 2023-08-06 DIAGNOSIS — I700 Atherosclerosis of aorta: Secondary | ICD-10-CM | POA: Diagnosis not present

## 2023-08-06 DIAGNOSIS — I25118 Atherosclerotic heart disease of native coronary artery with other forms of angina pectoris: Secondary | ICD-10-CM | POA: Diagnosis not present

## 2023-08-06 DIAGNOSIS — I059 Rheumatic mitral valve disease, unspecified: Secondary | ICD-10-CM

## 2023-08-06 DIAGNOSIS — E782 Mixed hyperlipidemia: Secondary | ICD-10-CM

## 2023-08-06 NOTE — Patient Instructions (Signed)
Medication Instructions:  No changes  Try some pepcid  If you need a refill on your cardiac medications before your next appointment, please call your pharmacy.   Lab work: No new labs needed  Testing/Procedures: No new testing needed  Follow-Up: At [facility], you and your health needs are our priority.  As part of our continuing mission to provide you with exceptional heart care, we have created designated Provider Care Teams.  These Care Teams include your primary Cardiologist (physician) and Advanced Practice Providers (APPs -  Physician Assistants and Nurse Practitioners) who all work together to provide you with the care you need, when you need it.  You will need a follow up appointment in 12 months  Providers on your designated Care Team:   Alexandra Ducking, NP Alexandra Listen, PA-C Alexandra Mendoza, [place]  COVID-19 Vaccine Information can be found at: [email or web address] For questions related to vaccine distribution or appointments, please email [email or web address] or call [phone].

## 2023-08-18 ENCOUNTER — Other Ambulatory Visit: Payer: Self-pay | Admitting: Internal Medicine

## 2023-08-18 DIAGNOSIS — I10 Essential (primary) hypertension: Secondary | ICD-10-CM

## 2023-09-15 ENCOUNTER — Other Ambulatory Visit: Payer: Self-pay | Admitting: Internal Medicine

## 2023-09-16 ENCOUNTER — Encounter: Payer: Self-pay | Admitting: Internal Medicine

## 2023-09-17 MED ORDER — PREGABALIN 75 MG PO CAPS: 75.0000 mg | ORAL_CAPSULE | Freq: Two times a day (BID) | ORAL | 2 refills | Status: DC

## 2023-09-17 NOTE — Telephone Encounter (Signed)
 Patient needs refills on her lyrica. Last refilled 06/2023 with 2 refills. [facility]

## 2023-09-17 NOTE — Telephone Encounter (Signed)
 Rx ok'd for trazodone.  Lyrica rx had already been sent in.

## 2023-09-17 NOTE — Telephone Encounter (Signed)
Rx ok'd for lyrica #60 with 2 refills.  

## 2023-10-08 ENCOUNTER — Ambulatory Visit (INDEPENDENT_AMBULATORY_CARE_PROVIDER_SITE_OTHER): Payer: PPO | Admitting: *Deleted

## 2023-10-08 VITALS — Ht 69.0 in | Wt 169.0 lb

## 2023-10-08 DIAGNOSIS — Z0000 Encounter for general adult medical examination without abnormal findings: Secondary | ICD-10-CM

## 2023-10-08 NOTE — Progress Notes (Signed)
Subjective:   Alexandra Mendoza is a 73 y.o. female who presents for Medicare Annual (Subsequent) preventive examination.  Visit Complete: Virtual I connected with  Melton Alar on 10/08/23 by a audio enabled telemedicine application and verified that I am speaking with the correct person using two identifiers. This patient declined Interactive audio and Acupuncturist. Therefore the visit was completed with audio only.   Patient Location: Other:  Landscape architect Location: Office/Clinic  I discussed the limitations of evaluation and management by telemedicine. The patient expressed understanding and agreed to proceed.  Vital Signs: Because this visit was a virtual/telehealth visit, some criteria may be missing or patient reported. Any vitals not documented were not able to be obtained and vitals that have been documented are patient reported.   Cardiac Risk Factors include: advanced age (>50men, >59 women);dyslipidemia;hypertension     Objective:    Today's Vitals   10/08/23 1417  Weight: 169 lb (76.7 kg)  Height: 5\' 9"  (1.753 m)   Body mass index is 24.96 kg/m.     10/08/2023    2:28 PM 05/23/2023   10:49 AM 10/05/2022    9:19 AM 09/20/2021    8:44 AM 10/08/2020    7:11 PM 06/24/2020    8:59 AM 11/26/2019    8:51 PM  Advanced Directives  Does Patient Have a Medical Advance Directive? Yes No Yes Yes No Yes No  Type of Estate agent of [place];Living will  Healthcare Power of [place];Living will Healthcare Power of [place];Living will  Healthcare Power of [place];Living will   Does patient want to make changes to medical advance directive?   No - Patient declined No - Patient declined  No - Patient declined   Copy of Healthcare Power of Attorney in Chart? No - copy requested  No - copy requested No - copy requested  No - copy requested   Would patient like information on creating a medical advance directive?       No - Patient declined     Current Medications (verified) Outpatient Encounter Medications as of 10/08/2023  Medication Sig   Alpha-Lipoic Acid 100 MG CAPS Take 600 mg by mouth daily.   amLODipine (NORVASC) 10 MG tablet TAKE 1 TABLET BY MOUTH EVERYDAY AT BEDTIME   ascorbic acid (VITAMIN C) 1000 MG tablet Take by mouth.   aspirin EC 81 MG tablet Take 81 mg by mouth daily.   CALCIUM MAGNESIUM ZINC PO Take by mouth daily.   carvedilol (COREG) 6.25 MG tablet TAKE 1 TABLET BY MOUTH TWICE A DAY WITH FOOD   Cholecalciferol (VITAMIN D3) 1000 units CAPS 1 cap(s) Orally Once a day   cyanocobalamin 1000 MCG tablet Take 1,000 mcg by mouth daily.   Docusate Calcium (STOOL SOFTENER PO) Take by mouth.   docusate sodium (COLACE) 100 MG capsule 1 capsule as needed Orally BID   hydrochlorothiazide (MICROZIDE) 12.5 MG capsule Take 1 capsule (12.5 mg total) by mouth daily.   irbesartan (AVAPRO) 300 MG tablet Take 1 tablet (300 mg total) by mouth daily.   Omega-3 Fatty Acids (FISH OIL) 1000 MG CAPS Take 1,200 mg by mouth daily.   pantoprazole (PROTONIX) 40 MG tablet Take 2 tablets (80 mg total) by mouth daily. Take 30 minutes before breakfast   pregabalin (LYRICA) 75 MG capsule Take 1 capsule (75 mg total) by mouth 2 (two) times daily.   Probiotic Product (PROBIOTIC PO) Take by mouth.   spironolactone (ALDACTONE) 25 MG tablet TAKE 1 TABLET  BY MOUTH EVERY DAY   traZODone (DESYREL) 50 MG tablet TAKE 1-2 TABLETS (50-100 MG TOTAL) BY MOUTH AT BEDTIME AS NEEDED.   atorvastatin (LIPITOR) 40 MG tablet Take by mouth.   No facility-administered encounter medications on file as of 10/08/2023.    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)   Heart murmur    History of chicken pox    Hyperlipidemia    Hypertension    Insomnia    Past Surgical History:  Procedure Laterality Date    APPENDECTOMY     BREAST EXCISIONAL BIOPSY Right    negative over 5 years ago- neg   BREAST SURGERY     Biopsy   COLONOSCOPY WITH PROPOFOL N/A 05/16/2016   Procedure: COLONOSCOPY WITH PROPOFOL;  Surgeon: Midge Minium, MD;  Location: [facility] END[facility]COPY;  Service: Endoscopy;  Laterality: N/A;   CORONARY ARTERY BYPASS GRAFT  05/01/2014   3 vessel, [facility]   ESOPHAGOGASTRODUODENOSCOPY (EGD) WITH PROPOFOL N/A 02/23/2017   Procedure: ESOPHAGOGASTRODUODENOSCOPY (EGD) WITH PROPOFOL;  Surgeon: Midge Minium, MD;  Location: [facility];  Service: Endoscopy;  Laterality: N/A;   HYSTEROSCOPY WITH D & C     lipoma removed     removed from forehead   skin lesions removed  01/2023   triple bypass     Family History  Problem Relation Age of Onset   Breast cancer Other        maternal great aunt   Heart disease Other        multiple family members   Heart disease Mother    Stroke Mother    Alzheimer's disease Mother    Heart disease Father    Stroke Father    Breast cancer Sister 29   Alzheimer's disease Maternal Aunt    Alzheimer's disease Maternal Uncle    Alzheimer's disease Maternal Grandmother    Colon cancer Neg Hx    Diabetes Neg Hx    Ovarian cancer Neg Hx    Social History   Socioeconomic History   Marital status: Married    Spouse name: Not on file   Number of children: Not on file   Years of education: Not on file   Highest education level: Not on file  Occupational History   Not on file  Tobacco Use   Smoking status: Never   Smokeless tobacco: Never  Vaping Use   Vaping status: Never Used  Substance and Sexual Activity   Alcohol use: Never    Alcohol/week: 0.0 standard drinks of alcohol   Drug use: No   Sexual activity: Yes    Birth control/protection: Post-menopausal  Other Topics Concern   Not on file  Social History Narrative   Married   Social Drivers of Health   Financial Resource Strain: Low Risk  (10/08/2023)   Overall Financial Resource  Strain (CARDIA)    Difficulty of Paying Living Expenses: Not hard at all  Food Insecurity: No Food Insecurity (10/08/2023)   Hunger Vital Sign    Worried About Running Out of Food in the Last Year: Never true    Ran Out of Food in the Last Year: Never true  Transportation Needs: No Transportation Needs (10/08/2023)   PRAPARE - Administrator, Civil Service (Medical): No    Lack of Transportation (Non-Medical): No  Physical  Activity: Insufficiently Active (10/08/2023)   Exercise Vital Sign    Days of Exercise per Week: 3 days    Minutes of Exercise per Session: 40 min  Stress: No Stress Concern Present (10/08/2023)   [employer] of Occupational Health - Occupational Stress Questionnaire    Feeling of Stress : Not at all  Social Connections: Moderately Integrated (10/08/2023)   Social Connection and Isolation Panel [NHANES]    Frequency of Communication with Friends and Family: More than three times a week    Frequency of Social Gatherings with Friends and Family: More than three times a week    Attends Religious Services: More than 4 times per year    Active Member of [employer] or Organizations: No    Attends Engineer, structural: Never    Marital Status: Married    Tobacco Counseling Counseling given: Not Answered   Clinical Intake:  Pre-visit preparation completed: Yes  Pain : No/denies pain     BMI - recorded: 24.96 Nutritional Status: BMI of 19-24  Normal Nutritional Risks: None Diabetes: No  How often do you need to have someone help you when you read instructions, pamphlets, or other written materials from your doctor or pharmacy?: 1 - Never  Interpreter Needed?: No  Information entered by :: R. Mccartney Brucks LPN   Activities of Daily Living    10/08/2023    2:19 PM  In your present state of health, do you have any difficulty performing the following activities:  Hearing? 0  Vision? 0  Comment readers  Difficulty concentrating or making  decisions? 0  Walking or climbing stairs? 0  Dressing or bathing? 0  Doing errands, shopping? 0  Preparing Food and eating ? N  Using the Toilet? N  In the past six months, have you accidently leaked urine? N  Do you have problems with loss of bowel control? N  Managing your Medications? N  Managing your Finances? N  Housekeeping or managing your Housekeeping? N    Patient Care Team: Dale [place], MD as PCP - General (Internal Medicine)  Indicate any recent Medical Services you may have received from other than [facility] providers in the past year (date may be approximate).     Assessment:   This is a routine wellness examination for Estefanie.  Hearing/Vision screen Hearing Screening - Comments:: No issues Vision Screening - Comments:: readers   Goals Addressed             This Visit's Progress    Patient Stated       Wants to be more active       Depression Screen    10/08/2023    2:23 PM 10/05/2022    9:20 AM 06/05/2022   10:16 AM 03/02/2022   10:23 AM 09/20/2021    8:42 AM 06/24/2020    8:51 AM 06/24/2019    8:48 AM  PHQ 2/9 Scores  PHQ - 2 Score 0 0 0 0 0 0 0  PHQ- 9 Score 1          Fall Risk    10/08/2023    2:20 PM 10/05/2022    9:21 AM 06/05/2022   10:16 AM 09/20/2021    8:45 AM 10/11/2020   12:16 PM  Fall Risk   Falls in the past year? 0 0 0 0 0  Number falls in past yr: 0 0  0 0  Injury with Fall? 0 0  0 0  Risk for fall due to : No Fall  Risks  No Fall Risks    Follow up Falls prevention discussed;Falls evaluation completed Falls evaluation completed;Falls prevention discussed Falls evaluation completed Falls evaluation completed Falls evaluation completed    MEDICARE RISK AT HOME: Medicare Risk at Home Any stairs in or around the home?: Yes If so, are there any without handrails?: No Home free of loose throw rugs in walkways, pet beds, electrical cords, etc?: Yes Adequate lighting in your home to reduce risk of falls?: Yes Life alert?: No Use  of a cane, walker or w/c?: No Grab bars in the bathroom?: Yes Shower chair or bench in shower?: No Elevated toilet seat or a handicapped toilet?: Yes    Cognitive Function:    05/14/2018    9:03 AM  MMSE - Mini Mental State Exam  Orientation to time 5  Orientation to Place 5  Registration 3  Attention/ Calculation 5  Recall 3  Language- name 2 objects 2  Language- repeat 1  Language- follow 3 step command 3  Language- read & follow direction 1  Write a sentence 1  Copy design 1  Total score 30        10/08/2023    2:29 PM 10/05/2022    9:22 AM 06/24/2019    8:56 AM  6CIT Screen  What Year? 0 points 0 points 0 points  What month? 0 points 0 points 0 points  What time? 0 points 0 points 0 points  Count back from 20 0 points 0 points 0 points  Months in reverse 0 points 0 points 0 points  Repeat phrase 0 points 0 points 0 points  Total Score 0 points 0 points 0 points    Immunizations Immunization History  Administered Date(s) Administered   Fluad Quad(high Dose 65+) 06/24/2019, 07/01/2020, 06/29/2021, 06/25/2022   Fluad Trivalent(High Dose 65+) 06/13/2023   Influenza, High Dose Seasonal PF 06/22/2016, 06/25/2017, 06/21/2018   Influenza,inj,Quad PF,6+ Mos 07/02/2014, 06/22/2015   Influenza-Unspecified 07/02/2014   PFIZER(Purple Top)SARS-COV-2 Vaccination 09/29/2019, 10/20/2019, 09/13/2020   Pneumococcal Conjugate-13 08/29/2016   Pneumococcal Polysaccharide-23 07/02/2014, 07/13/2020   Tdap 08/16/2015   Zoster Recombinant(Shingrix) 09/24/2017, 01/01/2018   Zoster, Live 04/06/2012, 09/11/2013    TDAP status: Up to date  Flu Vaccine status: Up to date  Pneumococcal vaccine status: Up to date  Covid-19 vaccine status: Information provided on how to obtain vaccines.   Qualifies for Shingles Vaccine? Yes   Zostavax completed Yes   Shingrix Completed?: Yes  Screening Tests Health Maintenance  Topic Date Due   COVID-19 Vaccine (4 - 2024-25 season) 05/13/2023    Medicare Annual Wellness (AWV)  10/06/2023   MAMMOGRAM  06/10/2024   DTaP/Tdap/Td (2 - Td or Tdap) 08/15/2025   Colonoscopy  05/16/2026   Pneumonia Vaccine 45+ Years old  Completed   INFLUENZA VACCINE  Completed   DEXA SCAN  Completed   Hepatitis C Screening  Completed   Zoster Vaccines- Shingrix  Completed   HPV VACCINES  Aged Out    Health Maintenance  Health Maintenance Due  Topic Date Due   COVID-19 Vaccine (4 - 2024-25 season) 05/13/2023   Medicare Annual Wellness (AWV)  10/06/2023    Colorectal cancer screening: Type of screening: Colonoscopy. Completed 05/2016. Repeat every 10 years  Mammogram status: Completed 05/2023. Repeat every year  Bone Density status: Completed 12/2021. Results reflect: Bone density results: OSTEOPENIA. Repeat every 2 years.  Lung Cancer Screening: (Low Dose CT Chest recommended if Age 21-80 years, 20 pack-year currently smoking OR have quit w/in 15years.) does  not qualify.     Additional Screening:  Hepatitis C Screening: does qualify; Completed 11/2021  Vision Screening: Recommended annual ophthalmology exams for early detection of glaucoma and other disorders of the eye. Is the patient up to date with their annual eye exam?  Yes  Who is the provider or what is the name of the office in which the patient attends annual eye exams? [facility] If pt is not established with a provider, would they like to be referred to a provider to establish care? No .   Dental Screening: Recommended annual dental exams for proper oral hygiene    Community Resource Referral / Chronic Care Management: CRR required this visit?  No   CCM required this visit?  No     Plan:     I have personally reviewed and noted the following in the patient's chart:   Medical and social history Use of alcohol, tobacco or illicit drugs  Current medications and supplements including opioid prescriptions. Patient is not currently taking opioid prescriptions. Functional  ability and status Nutritional status Physical activity Advanced directives List of other physicians Hospitalizations, surgeries, and ER visits in previous 12 months Vitals Screenings to include cognitive, depression, and falls Referrals and appointments  In addition, I have reviewed and discussed with patient certain preventive protocols, quality metrics, and best practice recommendations. A written personalized care plan for preventive services as well as general preventive health recommendations were provided to patient.     Sydell Axon, LPN   9/56/2130   After Visit Summary: (MyChart) Due to this being a telephonic visit, the after visit summary with patients personalized plan was offered to patient via MyChart   Nurse Notes: None

## 2023-10-08 NOTE — Patient Instructions (Signed)
Alexandra Mendoza , Thank you for taking time to come for your Medicare Wellness Visit. I appreciate your ongoing commitment to your health goals. Please review the following plan we discussed and let me know if I can assist you in the future.   Referrals/Orders/Follow-Ups/Clinician Recommendations: None  This is a list of the screening recommended for you and due dates:  Health Maintenance  Topic Date Due   COVID-19 Vaccine (4 - 2024-25 season) 05/13/2023   Mammogram  06/10/2024   Medicare Annual Wellness Visit  10/07/2024   DTaP/Tdap/Td vaccine (2 - Td or Tdap) 08/15/2025   Colon Cancer Screening  05/16/2026   Pneumonia Vaccine  Completed   Flu Shot  Completed   DEXA scan (bone density measurement)  Completed   Hepatitis C Screening  Completed   Zoster (Shingles) Vaccine  Completed   HPV Vaccine  Aged Out    Advanced directives: (Copy Requested) Please bring a copy of your health care power of attorney and living will to the office to be added to your chart at your convenience.  Next Medicare Annual Wellness Visit scheduled for next year: Yes 10/10/24 @ 10:50

## 2023-10-15 ENCOUNTER — Other Ambulatory Visit (INDEPENDENT_AMBULATORY_CARE_PROVIDER_SITE_OTHER): Payer: PPO

## 2023-10-15 DIAGNOSIS — E7800 Pure hypercholesterolemia, unspecified: Secondary | ICD-10-CM

## 2023-10-15 LAB — LIPID PANEL
Cholesterol: 105 mg/dL (ref 0–200)
HDL: 37.7 mg/dL — ABNORMAL LOW (ref >39.00)
LDL Cholesterol: 52 mg/dL (ref 0–99)
NonHDL: 67.77
Total CHOL/HDL Ratio: 3
Triglycerides: 79 mg/dL (ref 0.0–149.0)
VLDL: 15.8 mg/dL (ref 0.0–40.0)

## 2023-10-15 LAB — BASIC METABOLIC PANEL
BUN: 19 mg/dL (ref 6–23)
CO2: 30 meq/L (ref 19–32)
Calcium: 10 mg/dL (ref 8.4–10.5)
Chloride: 103 meq/L (ref 96–112)
Creatinine, Ser: 0.94 mg/dL (ref 0.40–1.20)
GFR: 60.67 mL/min (ref >60.00)
Glucose, Bld: 84 mg/dL (ref 70–99)
Potassium: 4.2 meq/L (ref 3.5–5.1)
Sodium: 140 meq/L (ref 135–145)

## 2023-10-15 LAB — HEPATIC FUNCTION PANEL
ALT: 18 U/L (ref 0–35)
AST: 16 U/L (ref 0–37)
Albumin: 4.4 g/dL (ref 3.5–5.2)
Alkaline Phosphatase: 38 U/L — ABNORMAL LOW (ref 39–117)
Bilirubin, Direct: 0.1 mg/dL (ref 0.0–0.3)
Total Bilirubin: 0.5 mg/dL (ref 0.2–1.2)
Total Protein: 6.8 g/dL (ref 6.0–8.3)

## 2023-10-17 ENCOUNTER — Encounter: Payer: Self-pay | Admitting: Internal Medicine

## 2023-10-17 ENCOUNTER — Ambulatory Visit (INDEPENDENT_AMBULATORY_CARE_PROVIDER_SITE_OTHER): Payer: PPO | Admitting: Internal Medicine

## 2023-10-17 VITALS — BP 128/72 | HR 62 | Temp 98.2°F | Resp 16 | Ht 69.5 in | Wt 172.2 lb

## 2023-10-17 DIAGNOSIS — Z0000 Encounter for general adult medical examination without abnormal findings: Secondary | ICD-10-CM

## 2023-10-17 DIAGNOSIS — K2100 Gastro-esophageal reflux disease with esophagitis, without bleeding: Secondary | ICD-10-CM | POA: Diagnosis not present

## 2023-10-17 DIAGNOSIS — G629 Polyneuropathy, unspecified: Secondary | ICD-10-CM

## 2023-10-17 DIAGNOSIS — R87629 Unspecified abnormal cytological findings in specimens from vagina: Secondary | ICD-10-CM

## 2023-10-17 DIAGNOSIS — M25511 Pain in right shoulder: Secondary | ICD-10-CM | POA: Diagnosis not present

## 2023-10-17 DIAGNOSIS — I10 Essential (primary) hypertension: Secondary | ICD-10-CM | POA: Diagnosis not present

## 2023-10-17 DIAGNOSIS — I700 Atherosclerosis of aorta: Secondary | ICD-10-CM | POA: Diagnosis not present

## 2023-10-17 DIAGNOSIS — E278 Other specified disorders of adrenal gland: Secondary | ICD-10-CM

## 2023-10-17 DIAGNOSIS — E7800 Pure hypercholesterolemia, unspecified: Secondary | ICD-10-CM

## 2023-10-17 DIAGNOSIS — M25512 Pain in left shoulder: Secondary | ICD-10-CM

## 2023-10-17 DIAGNOSIS — D696 Thrombocytopenia, unspecified: Secondary | ICD-10-CM | POA: Diagnosis not present

## 2023-10-17 DIAGNOSIS — I2510 Atherosclerotic heart disease of native coronary artery without angina pectoris: Secondary | ICD-10-CM

## 2023-10-17 MED ORDER — HYDROCHLOROTHIAZIDE 12.5 MG PO CAPS: 12.5000 mg | ORAL_CAPSULE | Freq: Every day | ORAL | 3 refills | Status: DC

## 2023-10-17 MED ORDER — PANTOPRAZOLE SODIUM 40 MG PO TBEC: 80.0000 mg | DELAYED_RELEASE_TABLET | Freq: Every day | ORAL | 3 refills | Status: DC

## 2023-10-17 MED ORDER — PANTOPRAZOLE SODIUM 20 MG PO TBEC: 20.0000 mg | DELAYED_RELEASE_TABLET | Freq: Two times a day (BID) | ORAL | 3 refills | Status: DC

## 2023-10-17 NOTE — Assessment & Plan Note (Signed)
 Physical today 10/17/23/  Colonoscopy 05/2016 - inflammation.  Recommended f/u colonoscopy in 10 years.  PAP 08/24/21 - Dr Raquel Cables.  Mammogram 06/11/23 - Birads I.  PAP 10/06/22 - negative with negative HPV.

## 2023-10-17 NOTE — Progress Notes (Signed)
 Subjective:    [name], female    DOB: May 20, 1951, 73 y.o.   MRN: [id number]  Patient here for  Chief Complaint  Patient presents with   Annual Exam    HPI Here for a physical exam. Saw cardiology 08/06/23 - stable. Reports she is doing relatively well. Has noticed some right arm/shoulder pain. If lying on right side - right upper arm hurts. If on left side - left upper arm hurts. Some bilateral shoulder discomfort. Discussed exercise and PT. Will do home stretches. Notify me if desires PT. Breathing stable. Acid reflux controlled. Discussed decreasing PPI.    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)   Heart murmur    History of chicken pox    Hyperlipidemia    Hypertension    Insomnia    Past Surgical History:  Procedure Laterality Date   APPENDECTOMY     BREAST EXCISIONAL BIOPSY Right    negative over 5 years ago- neg   BREAST SURGERY     Biopsy   COLONOSCOPY WITH PROPOFOL  N/A 05/16/2016   Procedure: COLONOSCOPY WITH PROPOFOL ;  Surgeon: Rogelia Copping, MD;  Location: [facility];  Service: Endoscopy;  Laterality: N/A;   CORONARY ARTERY BYPASS GRAFT  05/01/2014   3 vessel, [facility]   ESOPHAGOGASTRODUODENOSCOPY (EGD) WITH PROPOFOL  N/A 02/23/2017   Procedure: ESOPHAGOGASTRODUODENOSCOPY (EGD) WITH PROPOFOL ;  Surgeon: Copping Rogelia, MD;  Location: [facility];  Service: Endoscopy;  Laterality: N/A;   HYSTEROSCOPY WITH D & C     lipoma removed     removed from forehead   skin lesions removed  01/2023   triple bypass     Family History  Problem Relation Age of Onset   Breast cancer Other        maternal great aunt   Heart disease Other        multiple family members   Heart disease Mother    Stroke Mother    Alzheimer's disease Mother    Heart disease Father    Stroke Father    Breast cancer Sister 13   Alzheimer's disease Maternal Aunt     Alzheimer's disease Maternal Uncle    Alzheimer's disease Maternal Grandmother    Colon cancer Neg Hx    Diabetes Neg Hx    Ovarian cancer Neg Hx    Social History   Socioeconomic History   Marital status: Married    Spouse name: Not on file   Number of children: Not on file   Years of education: Not on file   Highest education level: Not on file  Occupational History   Not on file  Tobacco Use   Smoking status: Never   Smokeless tobacco: Never  Vaping Use   Vaping status: Never Used  Substance and Sexual Activity   Alcohol use: Never    Alcohol/week: 0.0 standard drinks of alcohol   Drug use: No   Sexual activity: Yes    Birth control/protection: Post-menopausal  Other Topics Concern   Not on file  Social History Narrative   Married   Social Drivers of Health   Financial Resource Strain: Low Risk  (10/08/2023)   Overall Financial Resource Strain (CARDIA)    Difficulty of Paying Living Expenses: Not hard at all  Food Insecurity: No Food Insecurity (10/08/2023)   Hunger Vital Sign    Worried  About Running Out of Food in the Last Year: Never true    Ran Out of Food in the Last Year: Never true  Transportation Needs: No Transportation Needs (10/08/2023)   PRAPARE - Administrator, Civil Service (Medical): No    Lack of Transportation (Non-Medical): No  Physical Activity: Insufficiently Active (10/08/2023)   Exercise Vital Sign    Days of Exercise per Week: 3 days    Minutes of Exercise per Session: 40 min  Stress: No Stress Concern Present (10/08/2023)   [employer] of Occupational Health - Occupational Stress Questionnaire    Feeling of Stress : Not at all  Social Connections: Moderately Integrated (10/08/2023)   Social Connection and Isolation Panel [NHANES]    Frequency of Communication with Friends and Family: More than three times a week    Frequency of Social Gatherings with Friends and Family: More than three times a week    Attends Religious  Services: More than 4 times per year    Active Member of [employer] or Organizations: No    Attends Banker Meetings: Never    Marital Status: Married     Review of Systems  Constitutional:  Negative for appetite change and unexpected weight change.  HENT:  Negative for congestion, sinus pressure and sore throat.   Eyes:  Negative for pain and visual disturbance.  Respiratory:  Negative for cough, chest tightness and shortness of breath.   Cardiovascular:  Negative for chest pain, palpitations and leg swelling.  Gastrointestinal:  Negative for abdominal pain, diarrhea, nausea and vomiting.  Genitourinary:  Negative for difficulty urinating and dysuria.  Musculoskeletal:  Negative for joint swelling and myalgias.       Arm/shoulder pain as outlined   Skin:  Negative for color change and rash.  Neurological:  Negative for dizziness and headaches.  Hematological:  Negative for adenopathy. Does not bruise/bleed easily.  Psychiatric/Behavioral:  Negative for agitation and dysphoric mood.        Objective:     BP 128/72   Pulse 62   Temp 98.2 F (36.8 C)   Resp 16   Ht 5' 9.5 (1.765 m)   Wt 172 lb 3.2 oz (78.1 kg)   SpO2 98%   BMI 25.06 kg/m  Wt Readings from Last 3 Encounters:  10/17/23 172 lb 3.2 oz (78.1 kg)  10/08/23 169 lb (76.7 kg)  08/06/23 171 lb 4 oz (77.7 kg)    Physical Exam Vitals reviewed.  Constitutional:      General: She is not in acute distress.    Appearance: Normal appearance. She is well-developed.  HENT:     Head: Normocephalic and atraumatic.     Right Ear: External ear normal.     Left Ear: External ear normal.     Mouth/Throat:     Pharynx: No oropharyngeal exudate or posterior oropharyngeal erythema.  Eyes:     General: No scleral icterus.       Right eye: No discharge.        Left eye: No discharge.     Conjunctiva/sclera: Conjunctivae normal.  Neck:     Thyroid : No thyromegaly.  Cardiovascular:     Rate and Rhythm: Normal  rate and regular rhythm.  Pulmonary:     Effort: No tachypnea, accessory muscle usage or respiratory distress.     Breath sounds: Normal breath sounds. No decreased breath sounds or wheezing.  Chest:  Breasts:    Right: No inverted nipple, mass, nipple discharge or  tenderness (no axillary adenopathy).     Left: No inverted nipple, mass, nipple discharge or tenderness (no axilarry adenopathy).  Abdominal:     General: Bowel sounds are normal.     Palpations: Abdomen is soft.     Tenderness: There is no abdominal tenderness.  Musculoskeletal:        General: No swelling or tenderness.     Cervical back: Neck supple.     Comments: Good rom - upper extremities.  No increased pain with full extension, abduction/adduction.   Lymphadenopathy:     Cervical: No cervical adenopathy.  Skin:    Findings: No erythema or rash.  Neurological:     Mental Status: She is alert and oriented to person, place, and time.  Psychiatric:        Mood and Affect: Mood normal.        Behavior: Behavior normal.         Outpatient Encounter Medications as of 10/17/2023  Medication Sig   pantoprazole  (PROTONIX ) 20 MG tablet Take 1 tablet (20 mg total) by mouth 2 (two) times daily before a meal.   Alpha-Lipoic Acid 100 MG CAPS Take 600 mg by mouth daily.   amLODipine  (NORVASC ) 10 MG tablet TAKE 1 TABLET BY MOUTH EVERYDAY AT BEDTIME   ascorbic acid (VITAMIN C) 1000 MG tablet Take by mouth.   aspirin EC 81 MG tablet Take 81 mg by mouth daily.   atorvastatin  (LIPITOR) 40 MG tablet Take by mouth.   CALCIUM  MAGNESIUM  ZINC PO Take by mouth daily.   carvedilol  (COREG ) 6.25 MG tablet TAKE 1 TABLET BY MOUTH TWICE A DAY WITH FOOD   Cholecalciferol (VITAMIN D3) 1000 units CAPS 1 cap(s) Orally Once a day   cyanocobalamin  1000 MCG tablet Take 1,000 mcg by mouth daily.   Docusate Calcium  (STOOL SOFTENER PO) Take by mouth.   docusate sodium (COLACE) 100 MG capsule 1 capsule as needed Orally BID   hydrochlorothiazide   (MICROZIDE ) 12.5 MG capsule Take 1 capsule (12.5 mg total) by mouth daily.   irbesartan  (AVAPRO ) 300 MG tablet Take 1 tablet (300 mg total) by mouth daily.   Omega-3 Fatty Acids (FISH OIL) 1000 MG CAPS Take 1,200 mg by mouth daily.   pregabalin  (LYRICA ) 75 MG capsule Take 1 capsule (75 mg total) by mouth 2 (two) times daily.   Probiotic Product (PROBIOTIC PO) Take by mouth.   spironolactone  (ALDACTONE ) 25 MG tablet TAKE 1 TABLET BY MOUTH EVERY DAY   traZODone  (DESYREL ) 50 MG tablet TAKE 1-2 TABLETS (50-100 MG TOTAL) BY MOUTH AT BEDTIME AS NEEDED.   [DISCONTINUED] hydrochlorothiazide  (MICROZIDE ) 12.5 MG capsule Take 1 capsule (12.5 mg total) by mouth daily.   [DISCONTINUED] pantoprazole  (PROTONIX ) 40 MG tablet Take 2 tablets (80 mg total) by mouth daily. Take 30 minutes before breakfast   [DISCONTINUED] pantoprazole  (PROTONIX ) 40 MG tablet Take 2 tablets (80 mg total) by mouth daily. Take 30 minutes before breakfast   No facility-administered encounter medications on file as of 10/17/2023.     Lab Results  Component Value Date   WBC 6.2 02/06/2023   HGB 12.4 02/06/2023   HCT 37.3 02/06/2023   PLT 190.0 02/06/2023   GLUCOSE 84 10/15/2023   CHOL 105 10/15/2023   TRIG 79.0 10/15/2023   HDL 37.70 (L) 10/15/2023   LDLCALC 52 10/15/2023   ALT 18 10/15/2023   AST 16 10/15/2023   NA 140 10/15/2023   K 4.2 10/15/2023   CL 103 10/15/2023   CREATININE 0.94 10/15/2023  BUN 19 10/15/2023   CO2 30 10/15/2023   TSH 3.34 06/11/2023    MM 3D SCREENING MAMMOGRAM BILATERAL BREAST Result Date: 06/12/2023 CLINICAL DATA:  Screening. EXAM: DIGITAL SCREENING BILATERAL MAMMOGRAM WITH TOMOSYNTHESIS AND CAD TECHNIQUE: Bilateral screening digital craniocaudal and mediolateral oblique mammograms were obtained. Bilateral screening digital breast tomosynthesis was performed. The images were evaluated with computer-aided detection. COMPARISON:  Previous exam(s). ACR Breast Density Category b: There are scattered  areas of fibroglandular density. FINDINGS: There are no findings suspicious for malignancy. IMPRESSION: No mammographic evidence of malignancy. A result letter of this screening mammogram will be mailed directly to the patient. RECOMMENDATION: Screening mammogram in one year. (Code:SM-B-01Y) BI-RADS CATEGORY  1: Negative. Electronically Signed   By: Delon Music M.D.   On: 06/12/2023 08:25       Assessment & Plan:  Routine general medical examination at a health care facility  Health care maintenance Assessment & Plan: Physical today 10/17/23/  Colonoscopy 05/2016 - inflammation.  Recommended f/u colonoscopy in 10 years.  PAP 08/24/21 - Dr Arloa.  Mammogram 06/11/23 - Birads I.  PAP 10/06/22 - negative with negative HPV.    Hypercholesterolemia Assessment & Plan: Continue lipitor.  Low cholesterol diet and exercise.  Follow lipid panel and liver function tests.   Orders: -     Lipid panel; Future -     Hepatic function panel; Future  Essential hypertension Assessment & Plan: Currently on coreg , amlodipine , hctz, avapro  and aldactone .  Previous abdominal ultrasound - kidneys relatively equal size, etc. Saw nephrology.  No changes made.  Pressure as outlined.  Follow.    Orders: -     CBC with Differential/Platelet; Future -     Basic metabolic panel; Future  Adrenal mass [facility]) Assessment & Plan: Has been evaluated by urology previously. 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 No further w/up warranted.    Abnormal vaginal Pap smear Assessment & Plan: Had a documented history of abnormal pap smear.  Was followed by gyn. Last pap here 10/06/22 - negative with negative HPV.     Aortic atherosclerosis (HCC) Assessment & Plan: Continue lipitor.    Coronary artery disease involving native coronary artery of native heart without angina pectoris Assessment & Plan: Saw Dr Florencio 10/23/22 - recommended myoview and echo.  Continue aspirin and atorvastatin .  Myoview overall  showed good exercise tolerance walking 7 minutes under the Bruce protocol with no significant symptom development. No evidence of myocardial ischemia by Myoview. Echocardiogram had showed overall normal LV and RV systolic function, EF greater than 55% with moderate TR overall unchanged from 2022  saw Dr Perla 01/29/23 - stable.  No changes made. Overall doing well.  Feels good.  No chest pain or sob reported. Continue risk factor modification. Had f/u with cardiology 08/06/23 - stable.    Gastroesophageal reflux disease with esophagitis without hemorrhage Assessment & Plan: Discussed decreasing protonix . Symptoms controlled.  Start with 40mg  in am and 20mg  in pm.  Follow. Plan for decrease to 20mg  bid.    Neuropathy Assessment & Plan: NCS revealed polyneuropathy.  Continue lyrica  at current dose.  Stable.  Follow.    Thrombocytopenia (HCC) Assessment & Plan: Follow cbc. Check cbc with next fasting labs.    Bilateral shoulder pain, unspecified chronicity Assessment & Plan: Arm/shoulder pain as outlined. Aggravated if lying on that side. Discussed exercise/stretches. Discussed PT. Will notify me if desires further intervention. Follow.    Other orders -     hydroCHLOROthiazide ; Take 1  capsule (12.5 mg total) by mouth daily.  Dispense: 90 capsule; Refill: 3 -     Pantoprazole  Sodium; Take 1 tablet (20 mg total) by mouth 2 (two) times daily before a meal.  Dispense: 60 tablet; Refill: 3     Allena Hamilton, MD

## 2023-10-21 ENCOUNTER — Encounter: Payer: Self-pay | Admitting: Internal Medicine

## 2023-10-21 DIAGNOSIS — M25519 Pain in unspecified shoulder: Secondary | ICD-10-CM | POA: Insufficient documentation

## 2023-10-21 NOTE — Assessment & Plan Note (Signed)
 Continue lipitor.  Low cholesterol diet and exercise.  Follow lipid panel and liver function tests.

## 2023-10-21 NOTE — Assessment & Plan Note (Addendum)
 Currently on coreg, amlodipine, hctz, avapro  and aldactone.  Previous abdominal ultrasound - kidneys relatively equal size, etc. Saw nephrology.  No changes made.  Pressure as outlined.  Follow.

## 2023-10-21 NOTE — Assessment & Plan Note (Signed)
 Arm/shoulder pain as outlined. Aggravated if lying on that side. Discussed exercise/stretches. Discussed PT. Will notify me if desires further intervention. Follow.

## 2023-10-21 NOTE — Assessment & Plan Note (Signed)
 Saw Dr Florencio 10/23/22 - recommended myoview and echo.  Continue aspirin and atorvastatin .  Myoview overall showed good exercise tolerance walking 7 minutes under the Bruce protocol with no significant symptom development. No evidence of myocardial ischemia by Myoview. Echocardiogram had showed overall normal LV and RV systolic function, EF greater than 55% with moderate TR overall unchanged from 2022  saw Dr Perla 01/29/23 - stable.  No changes made. Overall doing well.  Feels good.  No chest pain or sob reported. Continue risk factor modification. Had f/u with cardiology 08/06/23 - stable.

## 2023-10-21 NOTE — Assessment & Plan Note (Signed)
Has been evaluated by urology previously.  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  No further w/up warranted.   

## 2023-10-21 NOTE — Assessment & Plan Note (Signed)
 Follow cbc. Check cbc with next fasting labs.

## 2023-10-21 NOTE — Assessment & Plan Note (Signed)
Had a documented history of abnormal pap smear.  Was followed by gyn. Last pap here 10/06/22 - negative with negative HPV.

## 2023-10-21 NOTE — Assessment & Plan Note (Signed)
NCS revealed polyneuropathy.  Continue lyrica at current dose.  Stable.  Follow.

## 2023-10-21 NOTE — Assessment & Plan Note (Signed)
 Discussed decreasing protonix . Symptoms controlled.  Start with 40mg  in am and 20mg  in pm.  Follow. Plan for decrease to 20mg  bid.

## 2023-10-21 NOTE — Assessment & Plan Note (Signed)
 Continue lipitor  ?

## 2023-11-14 ENCOUNTER — Encounter: Payer: Self-pay | Admitting: Internal Medicine

## 2023-11-14 NOTE — Telephone Encounter (Signed)
 Please call and let her know that if she is having breakthrough symptoms on protonix 40mg  in am and 20mg  in pm, would hold on decreasing the dose. I can increase the dose back up if needed. Also, if persistent msk pain - can refer to PT or ortho if needed

## 2023-11-15 NOTE — Telephone Encounter (Signed)
 Patient is going to continue on 40mg  in the AM and 20mg  PM. She does not want to increase dose right now. Regarding msk pain, the exercises help so she would like to hold off on PT or ortho right now. She will let me know if something changes.

## 2023-12-13 ENCOUNTER — Other Ambulatory Visit: Payer: Self-pay | Admitting: Cardiovascular Disease

## 2023-12-13 NOTE — Telephone Encounter (Signed)
 Rx ok'd for atorvastatin

## 2023-12-19 ENCOUNTER — Encounter: Payer: Self-pay | Admitting: Internal Medicine

## 2023-12-19 ENCOUNTER — Other Ambulatory Visit: Payer: Self-pay | Admitting: Internal Medicine

## 2023-12-19 NOTE — Telephone Encounter (Signed)
 Rx ok;d for lyrica

## 2024-01-07 DIAGNOSIS — Z08 Encounter for follow-up examination after completed treatment for malignant neoplasm: Secondary | ICD-10-CM | POA: Diagnosis not present

## 2024-01-07 DIAGNOSIS — L720 Epidermal cyst: Secondary | ICD-10-CM | POA: Diagnosis not present

## 2024-01-07 DIAGNOSIS — C44319 Basal cell carcinoma of skin of other parts of face: Secondary | ICD-10-CM | POA: Diagnosis not present

## 2024-01-07 DIAGNOSIS — Z85828 Personal history of other malignant neoplasm of skin: Secondary | ICD-10-CM | POA: Diagnosis not present

## 2024-01-07 DIAGNOSIS — L728 Other follicular cysts of the skin and subcutaneous tissue: Secondary | ICD-10-CM | POA: Diagnosis not present

## 2024-01-07 DIAGNOSIS — D485 Neoplasm of uncertain behavior of skin: Secondary | ICD-10-CM | POA: Diagnosis not present

## 2024-01-07 DIAGNOSIS — L821 Other seborrheic keratosis: Secondary | ICD-10-CM | POA: Diagnosis not present

## 2024-01-12 ENCOUNTER — Other Ambulatory Visit: Payer: Self-pay | Admitting: Internal Medicine

## 2024-01-15 ENCOUNTER — Other Ambulatory Visit: Payer: Self-pay | Admitting: Internal Medicine

## 2024-02-11 ENCOUNTER — Ambulatory Visit: Payer: Self-pay | Admitting: Internal Medicine

## 2024-02-11 ENCOUNTER — Other Ambulatory Visit (INDEPENDENT_AMBULATORY_CARE_PROVIDER_SITE_OTHER): Payer: PPO

## 2024-02-11 DIAGNOSIS — E7800 Pure hypercholesterolemia, unspecified: Secondary | ICD-10-CM | POA: Diagnosis not present

## 2024-02-11 DIAGNOSIS — I10 Essential (primary) hypertension: Secondary | ICD-10-CM | POA: Diagnosis not present

## 2024-02-11 LAB — CBC WITH DIFFERENTIAL/PLATELET
Basophils Absolute: 0.1 10*3/uL (ref 0.0–0.1)
Basophils Relative: 1.2 % (ref 0.0–3.0)
Eosinophils Absolute: 0.1 10*3/uL (ref 0.0–0.7)
Eosinophils Relative: 2 % (ref 0.0–5.0)
HCT: 37.7 % (ref 36.0–46.0)
Hemoglobin: 12.6 g/dL (ref 12.0–15.0)
Lymphocytes Relative: 37.2 % (ref 12.0–46.0)
Lymphs Abs: 1.9 10*3/uL (ref 0.7–4.0)
MCHC: 33.4 g/dL (ref 30.0–36.0)
MCV: 92.1 fl (ref 78.0–100.0)
Monocytes Absolute: 0.6 10*3/uL (ref 0.1–1.0)
Monocytes Relative: 11.6 % (ref 3.0–12.0)
Neutro Abs: 2.4 10*3/uL (ref 1.4–7.7)
Neutrophils Relative %: 48 % (ref 43.0–77.0)
Platelets: 162 10*3/uL (ref 150.0–400.0)
RBC: 4.09 Mil/uL (ref 3.87–5.11)
RDW: 12.8 % (ref 11.5–15.5)
WBC: 5.1 10*3/uL (ref 4.0–10.5)

## 2024-02-11 LAB — BASIC METABOLIC PANEL WITH GFR
BUN: 18 mg/dL (ref 6–23)
CO2: 30 meq/L (ref 19–32)
Calcium: 10.3 mg/dL (ref 8.4–10.5)
Chloride: 102 meq/L (ref 96–112)
Creatinine, Ser: 0.94 mg/dL (ref 0.40–1.20)
GFR: 60.53 mL/min (ref >60.00)
Glucose, Bld: 88 mg/dL (ref 70–99)
Potassium: 4.3 meq/L (ref 3.5–5.1)
Sodium: 137 meq/L (ref 135–145)

## 2024-02-11 LAB — LIPID PANEL
Cholesterol: 99 mg/dL (ref 0–200)
HDL: 34.1 mg/dL — ABNORMAL LOW (ref >39.00)
LDL Cholesterol: 48 mg/dL (ref 0–99)
NonHDL: 64.94
Total CHOL/HDL Ratio: 3
Triglycerides: 84 mg/dL (ref 0.0–149.0)
VLDL: 16.8 mg/dL (ref 0.0–40.0)

## 2024-02-11 LAB — HEPATIC FUNCTION PANEL
ALT: 17 U/L (ref 0–35)
AST: 18 U/L (ref 0–37)
Albumin: 4.4 g/dL (ref 3.5–5.2)
Alkaline Phosphatase: 38 U/L — ABNORMAL LOW (ref 39–117)
Bilirubin, Direct: 0.2 mg/dL (ref 0.0–0.3)
Total Bilirubin: 0.6 mg/dL (ref 0.2–1.2)
Total Protein: 6.7 g/dL (ref 6.0–8.3)

## 2024-02-14 ENCOUNTER — Ambulatory Visit (INDEPENDENT_AMBULATORY_CARE_PROVIDER_SITE_OTHER): Payer: PPO | Admitting: Internal Medicine

## 2024-02-14 VITALS — BP 122/70 | HR 72 | Temp 98.0°F | Resp 16 | Ht 69.5 in | Wt 172.0 lb

## 2024-02-14 DIAGNOSIS — E538 Deficiency of other specified B group vitamins: Secondary | ICD-10-CM | POA: Diagnosis not present

## 2024-02-14 DIAGNOSIS — I2510 Atherosclerotic heart disease of native coronary artery without angina pectoris: Secondary | ICD-10-CM | POA: Diagnosis not present

## 2024-02-14 DIAGNOSIS — K2100 Gastro-esophageal reflux disease with esophagitis, without bleeding: Secondary | ICD-10-CM | POA: Diagnosis not present

## 2024-02-14 DIAGNOSIS — I10 Essential (primary) hypertension: Secondary | ICD-10-CM

## 2024-02-14 DIAGNOSIS — F419 Anxiety disorder, unspecified: Secondary | ICD-10-CM

## 2024-02-14 DIAGNOSIS — E7800 Pure hypercholesterolemia, unspecified: Secondary | ICD-10-CM

## 2024-02-14 DIAGNOSIS — E278 Other specified disorders of adrenal gland: Secondary | ICD-10-CM

## 2024-02-14 DIAGNOSIS — D696 Thrombocytopenia, unspecified: Secondary | ICD-10-CM

## 2024-02-14 DIAGNOSIS — I700 Atherosclerosis of aorta: Secondary | ICD-10-CM

## 2024-02-14 DIAGNOSIS — G629 Polyneuropathy, unspecified: Secondary | ICD-10-CM | POA: Diagnosis not present

## 2024-02-14 NOTE — Progress Notes (Signed)
 Subjective:    [name], female    DOB: 16-May-1951, 73 y.o.   MRN: [id number]  Patient here for  Chief Complaint  Patient presents with   Medical Management of Chronic Issues    HPI Here for a scheduled follow up - follow up regarding CAD, GERD, hypercholesterolemia, neuropathy and hypertension. Reports she is doing relatively well. Tries to stay active. No chest pain or sob reported. Acid reflux. On protonix . Discussed increase to 40mg  bid. No abdominal pain. Bowels moving.    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)   Heart murmur    History of chicken pox    Hyperlipidemia    Hypertension    Insomnia    Past Surgical History:  Procedure Laterality Date   APPENDECTOMY     BREAST EXCISIONAL BIOPSY Right    negative over 5 years ago- neg   BREAST SURGERY     Biopsy   COLONOSCOPY WITH PROPOFOL  N/A 05/16/2016   Procedure: COLONOSCOPY WITH PROPOFOL ;  Surgeon: Marnee Sink, MD;  Location: [facility] END[facility]COPY;  Service: Endoscopy;  Laterality: N/A;   CORONARY ARTERY BYPASS GRAFT  05/01/2014   3 vessel, [facility]   ESOPHAGOGASTRODUODENOSCOPY (EGD) WITH PROPOFOL  N/A 02/23/2017   Procedure: ESOPHAGOGASTRODUODENOSCOPY (EGD) WITH PROPOFOL ;  Surgeon: Marnee Sink, MD;  Location: [facility];  Service: Endoscopy;  Laterality: N/A;   HYSTEROSCOPY WITH D & C     lipoma removed     removed from forehead   skin lesions removed  01/2023   triple bypass     Family History  Problem Relation Age of Onset   Breast cancer Other        maternal great aunt   Heart disease Other        multiple family members   Heart disease Mother    Stroke Mother    Alzheimer's disease Mother    Heart disease Father    Stroke Father    Breast cancer Sister 47   Alzheimer's disease Maternal Aunt    Alzheimer's disease Maternal Uncle    Alzheimer's disease Maternal Grandmother     Colon cancer Neg Hx    Diabetes Neg Hx    Ovarian cancer Neg Hx    Social History   Socioeconomic History   Marital status: Married    Spouse name: Not on file   Number of children: Not on file   Years of education: Not on file   Highest education level: Not on file  Occupational History   Not on file  Tobacco Use   Smoking status: Never   Smokeless tobacco: Never  Vaping Use   Vaping status: Never Used  Substance and Sexual Activity   Alcohol use: Never    Alcohol/week: 0.0 standard drinks of alcohol   Drug use: No   Sexual activity: Yes    Birth control/protection: Post-menopausal  Other Topics Concern   Not on file  Social History Narrative   Married   Social Drivers of Health   Financial Resource Strain: Low Risk  (10/08/2023)   Overall Financial Resource Strain (CARDIA)    Difficulty of Paying Living Expenses: Not hard at all  Food Insecurity: No Food Insecurity (10/08/2023)   Hunger Vital Sign    Worried About Running Out of Food in the Last Year: Never true    Ran Out of  Food in the Last Year: Never true  Transportation Needs: No Transportation Needs (10/08/2023)   PRAPARE - Administrator, Civil Service (Medical): No    Lack of Transportation (Non-Medical): No  Physical Activity: Insufficiently Active (10/08/2023)   Exercise Vital Sign    Days of Exercise per Week: 3 days    Minutes of Exercise per Session: 40 min  Stress: No Stress Concern Present (10/08/2023)   [employer] of Occupational Health - Occupational Stress Questionnaire    Feeling of Stress : Not at all  Social Connections: Moderately Integrated (10/08/2023)   Social Connection and Isolation Panel [NHANES]    Frequency of Communication with Friends and Family: More than three times a week    Frequency of Social Gatherings with Friends and Family: More than three times a week    Attends Religious Services: More than 4 times per year    Active Member of [employer] or Organizations: No     Attends Banker Meetings: Never    Marital Status: Married     Review of Systems  Constitutional:  Negative for appetite change and unexpected weight change.  HENT:  Negative for congestion and sinus pressure.   Respiratory:  Negative for cough, chest tightness and shortness of breath.   Cardiovascular:  Negative for chest pain, palpitations and leg swelling.  Gastrointestinal:  Negative for abdominal pain, diarrhea, nausea and vomiting.  Genitourinary:  Negative for difficulty urinating and dysuria.  Musculoskeletal:  Negative for joint swelling and myalgias.  Skin:  Negative for color change and rash.  Neurological:  Negative for dizziness and headaches.  Psychiatric/Behavioral:  Negative for agitation and dysphoric mood.        Objective:     BP 122/70   Pulse 72   Temp 98 F (36.7 C)   Resp 16   Ht 5' 9.5" (1.765 m)   Wt 172 lb (78 kg)   SpO2 98%   BMI 25.04 kg/m  Wt Readings from Last 3 Encounters:  02/14/24 172 lb (78 kg)  10/17/23 172 lb 3.2 oz (78.1 kg)  10/08/23 169 lb (76.7 kg)    Physical Exam Vitals reviewed.  Constitutional:      General: She is not in acute distress.    Appearance: Normal appearance.  HENT:     Head: Normocephalic and atraumatic.     Right Ear: External ear normal.     Left Ear: External ear normal.     Mouth/Throat:     Pharynx: No oropharyngeal exudate or posterior oropharyngeal erythema.  Eyes:     General: No scleral icterus.       Right eye: No discharge.        Left eye: No discharge.     Conjunctiva/sclera: Conjunctivae normal.  Neck:     Thyroid : No thyromegaly.  Cardiovascular:     Rate and Rhythm: Normal rate and regular rhythm.  Pulmonary:     Effort: No respiratory distress.     Breath sounds: Normal breath sounds. No wheezing.  Abdominal:     General: Bowel sounds are normal.     Palpations: Abdomen is soft.     Tenderness: There is no abdominal tenderness.  Musculoskeletal:        General: No  swelling or tenderness.     Cervical back: Neck supple. No tenderness.  Lymphadenopathy:     Cervical: No cervical adenopathy.  Skin:    Findings: No erythema or rash.  Neurological:     Mental  Status: She is alert.  Psychiatric:        Mood and Affect: Mood normal.        Behavior: Behavior normal.         Outpatient Encounter Medications as of 02/14/2024  Medication Sig   pantoprazole  (PROTONIX ) 40 MG tablet Take 80 mg by mouth every morning.   Alpha-Lipoic Acid 100 MG CAPS Take 600 mg by mouth daily.   amLODipine (NORVASC) 10 MG tablet TAKE 1 TABLET BY MOUTH EVERYDAY AT BEDTIME   ascorbic acid (VITAMIN C) 1000 MG tablet Take by mouth.   aspirin EC 81 MG tablet Take 81 mg by mouth daily.   atorvastatin (LIPITOR) 40 MG tablet TAKE 1 TABLET BY MOUTH EVERY DAY   CALCIUM MAGNESIUM  ZINC PO Take by mouth daily.   carvedilol (COREG) 6.25 MG tablet TAKE 1 TABLET BY MOUTH TWICE A DAY WITH FOOD   Cholecalciferol (VITAMIN D3) 1000 units CAPS 1 cap(s) Orally Once a day   cyanocobalamin  1000 MCG tablet Take 1,000 mcg by mouth daily.   Docusate Calcium (STOOL SOFTENER PO) Take by mouth.   docusate sodium (COLACE) 100 MG capsule 1 capsule as needed Orally BID   hydrochlorothiazide  (MICROZIDE ) 12.5 MG capsule Take 1 capsule (12.5 mg total) by mouth daily.   irbesartan  (AVAPRO ) 300 MG tablet TAKE 1 TABLET BY MOUTH EVERY DAY   Omega-3 Fatty Acids (FISH OIL) 1000 MG CAPS Take 1,200 mg by mouth daily.   pregabalin  (LYRICA ) 75 MG capsule Take 1 capsule (75 mg total) by mouth 2 (two) times daily.   Probiotic Product (PROBIOTIC PO) Take by mouth.   spironolactone (ALDACTONE) 25 MG tablet TAKE 1 TABLET BY MOUTH EVERY DAY   traZODone  (DESYREL ) 50 MG tablet TAKE 1-2 TABLETS (50-100 MG TOTAL) BY MOUTH AT BEDTIME AS NEEDED.   [DISCONTINUED] pantoprazole  (PROTONIX ) 20 MG tablet TAKE 1 TABLET (20 MG TOTAL) BY MOUTH 2 (TWO) TIMES DAILY BEFORE A MEAL.   [DISCONTINUED] pregabalin  (LYRICA ) 75 MG capsule TAKE 1  CAPSULE BY MOUTH TWICE A DAY   No facility-administered encounter medications on file as of 02/14/2024.     Lab Results  Component Value Date   WBC 5.1 02/11/2024   HGB 12.6 02/11/2024   HCT 37.7 02/11/2024   PLT 162.0 02/11/2024   GLUCOSE 88 02/11/2024   CHOL 99 02/11/2024   TRIG 84.0 02/11/2024   HDL 34.10 (L) 02/11/2024   LDLCALC 48 02/11/2024   ALT 17 02/11/2024   AST 18 02/11/2024   NA 137 02/11/2024   K 4.3 02/11/2024   CL 102 02/11/2024   CREATININE 0.94 02/11/2024   BUN 18 02/11/2024   CO2 30 02/11/2024   TSH 3.34 06/11/2023    MM 3D SCREENING MAMMOGRAM BILATERAL BREAST Result Date: 06/12/2023 CLINICAL DATA:  Screening. EXAM: DIGITAL SCREENING BILATERAL MAMMOGRAM WITH TOMOSYNTHESIS AND CAD TECHNIQUE: Bilateral screening digital craniocaudal and mediolateral oblique mammograms were obtained. Bilateral screening digital breast tomosynthesis was performed. The images were evaluated with computer-aided detection. COMPARISON:  Previous exam(s). ACR Breast Density Category b: There are scattered areas of fibroglandular density. FINDINGS: There are no findings suspicious for malignancy. IMPRESSION: No mammographic evidence of malignancy. A result letter of this screening mammogram will be mailed directly to the patient. RECOMMENDATION: Screening mammogram in one year. (Code:SM-B-01Y) BI-RADS CATEGORY  1: Negative. Electronically Signed   By: Alger Infield M.D.   On: 06/12/2023 08:25       Assessment & Plan:  Adrenal mass [facility]) Assessment & Plan: Has been  evaluated by urology previously. 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 No further w/up warranted.    Hypercholesterolemia Assessment & Plan: Continue lipitor.  Low cholesterol diet and exercise.  Follow lipid panel and liver function tests.   Orders: -     Lipid panel; Future -     Hepatic function panel; Future  Essential hypertension Assessment & Plan: Currently on coreg, amlodipine, hctz, avapro  and  aldactone.  Previous abdominal ultrasound - kidneys relatively equal size. Saw nephrology.  No changes made.  Pressure as outlined. Controlled. Follow.   Orders: -     Basic metabolic panel with GFR; Future  Anxiety Assessment & Plan: Continues trazodone  at night.  Overall appears to be doing relatively well.  No changes. Follow.    Aortic atherosclerosis (HCC) Assessment & Plan: Continue lipitor.    B12 deficiency Assessment & Plan: Check B12 with next labs.    Coronary artery disease involving native coronary artery of native heart without angina pectoris Assessment & Plan: Saw Dr Beau Bound 10/23/22 - recommended myoview and echo.  Continue aspirin and atorvastatin.  Myoview overall showed good exercise tolerance walking 7 minutes under the Bruce protocol with no significant symptom development. No evidence of myocardial ischemia by Myoview. Echocardiogram had showed overall normal LV and RV systolic function, EF greater than 55% with moderate TR overall unchanged from 2022  saw Dr Jerelene Monday 01/29/23 - stable.  No changes made. Overall doing well.  Feels good.  No chest pain or sob reported. Continue risk factor modification. Had f/u with cardiology 08/06/23 - stable. Currently doing well. No changes today.    Gastroesophageal reflux disease with esophagitis without hemorrhage Assessment & Plan: Acid reflux. Discussed protonix  40mg  bid. Avoid foods that aggravate. Consider GI referral - persistent symptoms despite medication.    Neuropathy Assessment & Plan: NCS revealed polyneuropathy.  Continue lyrica  at current dose.  Stable.    Thrombocytopenia (HCC) Assessment & Plan: Last platelet count wnl.    Other orders -     Pregabalin ; Take 1 capsule (75 mg total) by mouth 2 (two) times daily.  Dispense: 60 capsule; Refill: 2     Dellar Fenton, MD

## 2024-02-17 ENCOUNTER — Encounter: Payer: Self-pay | Admitting: Internal Medicine

## 2024-02-17 MED ORDER — PREGABALIN 75 MG PO CAPS: 75.0000 mg | ORAL_CAPSULE | Freq: Two times a day (BID) | ORAL | 2 refills | Status: DC

## 2024-02-17 NOTE — Assessment & Plan Note (Signed)
 Continue lipitor  ?

## 2024-02-17 NOTE — Assessment & Plan Note (Signed)
 Saw Dr Beau Bound 10/23/22 - recommended myoview and echo.  Continue aspirin and atorvastatin.  Myoview overall showed good exercise tolerance walking 7 minutes under the Bruce protocol with no significant symptom development. No evidence of myocardial ischemia by Myoview. Echocardiogram had showed overall normal LV and RV systolic function, EF greater than 55% with moderate TR overall unchanged from 2022  saw Dr Jerelene Monday 01/29/23 - stable.  No changes made. Overall doing well.  Feels good.  No chest pain or sob reported. Continue risk factor modification. Had f/u with cardiology 08/06/23 - stable. Currently doing well. No changes today.

## 2024-02-17 NOTE — Assessment & Plan Note (Signed)
 Continue lipitor.  Low cholesterol diet and exercise.  Follow lipid panel and liver function tests.

## 2024-02-17 NOTE — Assessment & Plan Note (Signed)
 NCS revealed polyneuropathy.  Continue lyrica  at current dose.  Stable.

## 2024-02-17 NOTE — Assessment & Plan Note (Signed)
Last platelet count wnl.   

## 2024-02-17 NOTE — Assessment & Plan Note (Signed)
 Continues trazodone  at night.  Overall appears to be doing relatively well.  No changes. Follow.

## 2024-02-17 NOTE — Assessment & Plan Note (Signed)
 Acid reflux. Discussed protonix  40mg  bid. Avoid foods that aggravate. Consider GI referral - persistent symptoms despite medication.

## 2024-02-17 NOTE — Assessment & Plan Note (Signed)
Has been evaluated by urology previously.  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  No further w/up warranted.   

## 2024-02-17 NOTE — Assessment & Plan Note (Signed)
 Currently on coreg, amlodipine, hctz, avapro  and aldactone.  Previous abdominal ultrasound - kidneys relatively equal size. Saw nephrology.  No changes made.  Pressure as outlined. Controlled. Follow.

## 2024-02-17 NOTE — Assessment & Plan Note (Signed)
 Check B12 with next labs.

## 2024-02-27 ENCOUNTER — Encounter: Payer: Self-pay | Admitting: Internal Medicine

## 2024-02-27 DIAGNOSIS — R109 Unspecified abdominal pain: Secondary | ICD-10-CM

## 2024-02-27 NOTE — Telephone Encounter (Signed)
 Ok to place referral to Dr Corky Diener?

## 2024-02-27 NOTE — Telephone Encounter (Signed)
 Order placed for referral to GI - [facility] - Dr Corky Diener.

## 2024-03-11 ENCOUNTER — Encounter: Payer: Self-pay | Admitting: Physician Assistant

## 2024-03-12 DIAGNOSIS — C44319 Basal cell carcinoma of skin of other parts of face: Secondary | ICD-10-CM | POA: Diagnosis not present

## 2024-03-16 ENCOUNTER — Other Ambulatory Visit: Payer: Self-pay | Admitting: Internal Medicine

## 2024-03-27 NOTE — Progress Notes (Unsigned)
 03/28/2024 Alexandra Mendoza [id number] February 11, 1951  Referring provider: Glendia Shad, MD Primary GI doctor: Dr. San  ASSESSMENT AND PLAN:  GERD with Abdominal bloating and pain, has been having radiating pain around her breast bone and into her back, normally several hours after dinner, has had weight loss 02/23/2017 EGD for GERD small hiatal hernia normal stomach normal duodenum no specimens collected 06/28/2021 CT abdomen without contrast slight interval decrease size benign left adrenal myolipoma, cholelithiasis without acute cholecystitis, small hiatal hernia aortic atherosclerosis On pantoprazole  40 mg since 2018, increased to twice a day for the past year, tried to decrease the dose to 40 mg AM and 20 mg PM but this caused worsening symptoms Some concern for biliary dyskinesia versus GERD/gastritis versus other - continue pantoprazole  40 mg BID with pepcid  at night -alginate therapy given to take at night -Lifestyle changes discussed, avoid NSAIDS, ETOH, hand out given to the patient -Schedule EGD at [facility] to evaluate GERD, esophagitis, hiatal hernia,H pylori. I discussed risks of EGD with patient today, including risk of sedation, bleeding or perforation. Patient provides understanding and gave verbal consent to proceed. - schedule AB US  and HIDA pending results -Check for celiac.  -Will optimize bowel regiment for possible IBS/constipation with miralax/benefiber -Given FODMAP information - consider GES  Personal history of colon polyps 05/16/2016 colonoscopy for screening, good prep, 3 mm polyp transverse colon otherwise normal, inflammatory polyp, recall 10 years Place recall  CAD s/p bypass 2015 10/2022 normal echo, EF 55%, no AS, moderate MR Has seen Dr. Perla 07/2023, negative cardiac work up  Patient Care Team: Glendia Shad, MD as PCP - General (Internal Medicine)  HISTORY OF PRESENT ILLNESS: 73 y.o. female with a past medical history listed below presents  as a new patient for evaluation of bloating and abdominal discomfort.  Last seen by  GI Dr. Jinny 2018  Discussed the use of AI scribe software for clinical note transcription with the patient, who gave verbal consent to proceed.  History of Present Illness   Alexandra Mendoza is a 73 year old female with a history of hiatal hernia and gallstones who presents with bloating and abdominal discomfort. She was referred by Dr. Glendia for evaluation of her gastrointestinal symptoms and medication management.  She has been experiencing bloating and abdominal discomfort for the past two to two and a half months. The bloating is described as a tightness that radiates to the center of her back. Symptoms are more pronounced in the evening, particularly after eating, leading her to avoid dinner and resulting in weight loss. She occasionally eats a small snack in the evening, which still triggers bloating around 5:30 to 6:00 PM. She takes Pepcid , which provides some relief, but the symptoms persist.  She has a history of a small hiatal hernia and gallstones, identified during a CT scan in October 2022. She was previously on pantoprazole  40 mg twice daily for her symptoms, which she has been taking for months, possibly up to a year. Attempts to reduce the dose to 20 mg were unsuccessful in managing her symptoms, leading her to resume the higher dose. She also takes Pepcid  in the evening, which helps alleviate the symptoms to some extent.  She underwent an endoscopy in June 2018, which revealed a small hiatal hernia, but no biopsies were taken at that time. She has a history of gallstones, which were noted during a CT scan in October 2022, but there was no acute inflammation at that time.  She  has a history of coronary artery bypass surgery in 2015. She underwent an echocardiogram in February 2025. No nausea, vomiting, or pain with exertion. She does not experience shortness of breath or sweating with her  symptoms.  She has a history of constipation, with bowel movements occurring every two to three days, and occasional use of laxatives. She does not smoke or consume alcohol. Her family history includes gallbladder issues, as her father had gallstones.        She  reports that she has never smoked. She has never used smokeless tobacco. She reports that she does not drink alcohol and does not use drugs.  RELEVANT GI HISTORY, IMAGING AND LABS: Results   RADIOLOGY Abdominal CT: Left adrenal myolipoma, benign; gallstones in gallbladder, no acute inflammation (06/2021)  DIAGNOSTIC Endoscopy: Small hiatal hernia, normal stomach, normal duodenum, no biopsies taken (02/23/2017) Echocardiogram: Normal (10/2023) Colonoscopy: Small inflammatory polyp, benign (2017)      CBC    Component Value Date/Time   WBC 5.1 02/11/2024 0853   RBC 4.09 02/11/2024 0853   HGB 12.6 02/11/2024 0853   HGB 12.8 10/04/2012 1217   HCT 37.7 02/11/2024 0853   HCT 37.6 10/04/2012 1217   PLT 162.0 02/11/2024 0853   PLT 185 10/04/2012 1217   MCV 92.1 02/11/2024 0853   MCV 91 10/04/2012 1217   MCH 30.4 11/26/2019 2052   MCHC 33.4 02/11/2024 0853   RDW 12.8 02/11/2024 0853   RDW 13.2 10/04/2012 1217   LYMPHSABS 1.9 02/11/2024 0853   MONOABS 0.6 02/11/2024 0853   EOSABS 0.1 02/11/2024 0853   BASOSABS 0.1 02/11/2024 0853   Recent Labs    02/11/24 0853  HGB 12.6    CMP     Component Value Date/Time   NA 137 02/11/2024 0853   NA 143 11/26/2014 1148   NA 141 10/04/2012 1217   K 4.3 02/11/2024 0853   K 3.8 10/04/2012 1217   CL 102 02/11/2024 0853   CL 110 (H) 10/04/2012 1217   CO2 30 02/11/2024 0853   CO2 26 10/04/2012 1217   GLUCOSE 88 02/11/2024 0853   GLUCOSE 91 10/04/2012 1217   BUN 18 02/11/2024 0853   BUN 15 11/26/2014 1148   BUN 12 10/04/2012 1217   CREATININE 0.94 02/11/2024 0853   CREATININE 0.86 10/04/2012 1217   CALCIUM 10.3 02/11/2024 0853   CALCIUM 9.1 10/04/2012 1217   PROT 6.7  02/11/2024 0853   PROT 6.5 10/04/2012 1217   ALBUMIN 4.4 02/11/2024 0853   ALBUMIN 3.7 10/04/2012 1217   AST 18 02/11/2024 0853   AST 19 10/04/2012 1217   ALT 17 02/11/2024 0853   ALT 32 10/04/2012 1217   ALKPHOS 38 (L) 02/11/2024 0853   ALKPHOS 43 (L) 10/04/2012 1217   BILITOT 0.6 02/11/2024 0853   BILITOT 0.2 10/04/2012 1217   GFRNONAA >60 11/26/2019 2052   GFRNONAA >60 10/04/2012 1217   GFRAA >60 11/26/2019 2052   GFRAA >60 10/04/2012 1217      Latest Ref Rng & Units 02/11/2024    8:53 AM 10/15/2023    8:06 AM 06/11/2023    8:42 AM  Hepatic Function  Total Protein 6.0 - 8.3 g/dL 6.7  6.8  6.7   Albumin 3.5 - 5.2 g/dL 4.4  4.4  4.2   AST 0 - 37 U/L 18  16  16    ALT 0 - 35 U/L 17  18  17    Alk Phosphatase 39 - 117 U/L 38  38  34  Total Bilirubin 0.2 - 1.2 mg/dL 0.6  0.5  0.7   Bilirubin, Direct 0.0 - 0.3 mg/dL 0.2  0.1  0.1       Current Medications:    Current Outpatient Medications (Cardiovascular):    amLODipine (NORVASC) 10 MG tablet, TAKE 1 TABLET BY MOUTH EVERYDAY AT BEDTIME   atorvastatin (LIPITOR) 40 MG tablet, TAKE 1 TABLET BY MOUTH EVERY DAY   carvedilol (COREG) 6.25 MG tablet, TAKE 1 TABLET BY MOUTH TWICE A DAY WITH FOOD   hydrochlorothiazide  (MICROZIDE ) 12.5 MG capsule, Take 1 capsule (12.5 mg total) by mouth daily.   irbesartan  (AVAPRO ) 300 MG tablet, TAKE 1 TABLET BY MOUTH EVERY DAY   spironolactone (ALDACTONE) 25 MG tablet, TAKE 1 TABLET BY MOUTH EVERY DAY   Current Outpatient Medications (Analgesics):    aspirin EC 81 MG tablet, Take 81 mg by mouth daily.  Current Outpatient Medications (Hematological):    cyanocobalamin  1000 MCG tablet, Take 1,000 mcg by mouth daily.  Current Outpatient Medications (Other):    Alpha-Lipoic Acid 100 MG CAPS, Take 600 mg by mouth daily.   ascorbic acid (VITAMIN C) 1000 MG tablet, Take by mouth.   calcium carbonate (OS-CAL) 1250 (500 Ca) MG chewable tablet, Chew 1 tablet by mouth daily.   CALCIUM MAGNESIUM  ZINC PO,  Take by mouth daily.   Cholecalciferol (VITAMIN D3) 1000 units CAPS, 1 cap(s) Orally Once a day   Docusate Calcium (STOOL SOFTENER PO), Take by mouth.   docusate sodium (COLACE) 100 MG capsule, 1 capsule as needed Orally BID   Omega-3 Fatty Acids (FISH OIL) 1000 MG CAPS, Take 1,200 mg by mouth daily.   pantoprazole  (PROTONIX ) 40 MG tablet, Take 80 mg by mouth every morning.   pregabalin  (LYRICA ) 75 MG capsule, Take 1 capsule (75 mg total) by mouth 2 (two) times daily.   Probiotic Product (PROBIOTIC PO), Take by mouth.   traZODone  (DESYREL ) 50 MG tablet, TAKE 1-2 TABLETS (50-100 MG TOTAL) BY MOUTH AT BEDTIME AS NEEDED.  Medical History:  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)   Heart murmur    History of chicken pox    Hyperlipidemia    Hypertension    Insomnia    Allergies:  Allergies  Allergen Reactions   Valsartan Hives and Rash   Hydralazine      Other reaction(s): Dizziness Patient felt like she was going to pass out      Surgical History:  She  has a past surgical history that includes Appendectomy; triple bypass; Hysteroscopy with D & C; Breast surgery; lipoma removed; Colonoscopy with propofol  (N/A, 05/16/2016); Coronary artery bypass graft (05/01/2014); Esophagogastroduodenoscopy (egd) with propofol  (N/A, 02/23/2017); Breast excisional biopsy (Right); and skin lesions removed (01/2023). Family History:  Her family history includes Alzheimer's disease in her maternal aunt, maternal grandmother, maternal uncle, and mother; Breast cancer (age of onset: 61) in her sister; Heart disease in her father and mother; Stroke in her father and mother.  REVIEW OF SYSTEMS  : All other systems reviewed and negative except where noted in the History of Present Illness.  PHYSICAL EXAM: BP 118/68   Pulse 63   Ht 5' 9 (1.753 m)   Wt 166 lb (75.3 kg)   BMI 24.51 kg/m  Physical Exam    GENERAL APPEARANCE: Well nourished, in no apparent distress. HEENT: No cervical lymphadenopathy, unremarkable thyroid , sclerae  anicteric, conjunctiva pink. RESPIRATORY: Respiratory effort normal, breath sounds clear to auscultation bilaterally without rales, rhonchi, or wheezing. CARDIO: RRR with no MRGs, peripheral pulses intact. ABDOMEN: Abdomen soft, non-tender, non-distended, active bowel sounds in all 4 quadrants, no masses appreciated. RECTAL: Declines. MUSCULOSKELETAL: Full ROM, normal gait, without edema. SKIN: Dry, intact without rashes or lesions. No jaundice. NEURO: Alert, oriented, no focal deficits. PSYCH: Cooperative, normal mood and affect.      Alan JONELLE Coombs, PA-C 10:03 AM

## 2024-03-28 ENCOUNTER — Ambulatory Visit: Admitting: Physician Assistant

## 2024-03-28 ENCOUNTER — Encounter: Payer: Self-pay | Admitting: Physician Assistant

## 2024-03-28 ENCOUNTER — Ambulatory Visit: Payer: Self-pay | Admitting: Physician Assistant

## 2024-03-28 ENCOUNTER — Other Ambulatory Visit

## 2024-03-28 VITALS — BP 118/68 | HR 63 | Ht 69.0 in | Wt 166.0 lb

## 2024-03-28 DIAGNOSIS — R1013 Epigastric pain: Secondary | ICD-10-CM

## 2024-03-28 DIAGNOSIS — I059 Rheumatic mitral valve disease, unspecified: Secondary | ICD-10-CM

## 2024-03-28 DIAGNOSIS — R140 Abdominal distension (gaseous): Secondary | ICD-10-CM

## 2024-03-28 DIAGNOSIS — I2510 Atherosclerotic heart disease of native coronary artery without angina pectoris: Secondary | ICD-10-CM | POA: Diagnosis not present

## 2024-03-28 LAB — CBC WITH DIFFERENTIAL/PLATELET
Basophils Absolute: 0.1 K/uL (ref 0.0–0.1)
Basophils Relative: 1 % (ref 0.0–3.0)
Eosinophils Absolute: 0.1 K/uL (ref 0.0–0.7)
Eosinophils Relative: 1.5 % (ref 0.0–5.0)
HCT: 38 % (ref 36.0–46.0)
Hemoglobin: 12.9 g/dL (ref 12.0–15.0)
Lymphocytes Relative: 29.9 % (ref 12.0–46.0)
Lymphs Abs: 2 K/uL (ref 0.7–4.0)
MCHC: 33.9 g/dL (ref 30.0–36.0)
MCV: 91.1 fl (ref 78.0–100.0)
Monocytes Absolute: 0.7 K/uL (ref 0.1–1.0)
Monocytes Relative: 10.9 % (ref 3.0–12.0)
Neutro Abs: 3.8 K/uL (ref 1.4–7.7)
Neutrophils Relative %: 56.7 % (ref 43.0–77.0)
Platelets: 183 K/uL (ref 150.0–400.0)
RBC: 4.17 Mil/uL (ref 3.87–5.11)
RDW: 12.5 % (ref 11.5–15.5)
WBC: 6.8 K/uL (ref 4.0–10.5)

## 2024-03-28 LAB — COMPREHENSIVE METABOLIC PANEL WITH GFR
ALT: 16 U/L (ref 0–35)
AST: 17 U/L (ref 0–37)
Albumin: 4.7 g/dL (ref 3.5–5.2)
Alkaline Phosphatase: 42 U/L (ref 39–117)
BUN: 18 mg/dL (ref 6–23)
CO2: 29 meq/L (ref 19–32)
Calcium: 10.8 mg/dL — ABNORMAL HIGH (ref 8.4–10.5)
Chloride: 100 meq/L (ref 96–112)
Creatinine, Ser: 1.05 mg/dL (ref 0.40–1.20)
GFR: 52.96 mL/min — ABNORMAL LOW (ref >60.00)
Glucose, Bld: 97 mg/dL (ref 70–99)
Potassium: 4.7 meq/L (ref 3.5–5.1)
Sodium: 136 meq/L (ref 135–145)
Total Bilirubin: 0.6 mg/dL (ref 0.2–1.2)
Total Protein: 7.2 g/dL (ref 6.0–8.3)

## 2024-03-28 NOTE — Patient Instructions (Signed)
 Your provider has requested that you go to the basement level for lab work before leaving today. Press B on the elevator. The lab is located at the first door on the left as you exit the elevator.  You have been scheduled for an abdominal ultrasound at [facility] [address] [place], [place], [place] [address] on 04/04/2024 at 10 am. Please arrive 30 minutes prior to your appointment for registration. Make certain not to have anything to eat or drink 8 hours prior to your appointment. Should you need to reschedule your appointment, please contact radiology at [phone]. This test typically takes about 30 minutes to perform.  Due to recent changes in healthcare laws, you may see the results of your imaging and laboratory studies on MyChart before your provider has had a chance to review them.  We understand that in some cases there may be results that are confusing or concerning to you. Not all laboratory results come back in the same time frame and the provider may be waiting for multiple results in order to interpret others.  Please give us  48 hours in order for your provider to thoroughly review all the results before contacting the office for clarification of your results.     Please take your proton pump inhibitor medication, pantoprazole  40 mg twice a day Please take this medication 30 minutes to 1 hour before meals- this makes it more effective.  Take the pepcid  20 mg at night every night, consider in the morning Avoid spicy and acidic foods Avoid fatty foods Limit your intake of coffee, tea, alcohol, and carbonated drinks Work to maintain a healthy weight Keep the head of the bed elevated at least 3 inches with blocks or a wedge pillow if you are having any nighttime symptoms Stay upright for 2 hours after eating Avoid meals and snacks three to four hours before bedtime  Reflux Gourmet Rescue- take at night  It is an ALGINATE THERAPY which is the only intervention that  works to safeguard the esophagus by creating a protective barrier that actually stops reflux from happening. -The general directions for use are as stated on the packaging: Take 1 teaspoon (5 ml), or more as needed or as directed by your physician, after meals and before bed. -These general directions address the most common times for reflux to occur, but our Rescue products may be taken anytime. Some individuals may take our product preemptively, when they know they will suffer from reflux, or as needed - when discomfort arises. (If taken around food, it should be consumed last.) -You do not have to take 1 teaspoon (5 ml) of the product. While one teaspoon (5ml) may be the perfect average amount to relieve reflux suffering in some, others may require more or less. You may adjust the amount of Mint Chocolate Rescue and Vanilla Caramel Rescue to the lowest amount necessary to meet your individual needs to improve your quality of life. -You may dilute the product if it is too viscous for you to consume. Keep in mind, however, that the thickness of the product was formulated to provide optimal coating and protection of your throat and esophagus. Though diluting the product is possible, it may reduce the protective function and/or length of action. -This can be used in conjunction with reflux medications and lifestyle changes.  100% ALL-NATURAL  Paraben FREE, glycerin FREE, & potassium FREE  Made entirely from all-natural ingredients considered safe for children and during pregnancy  No known side effects  All-natural flavor Gluten FREE  Allergen FREE  Vegan  Can find more information here: NameSeizer.co.nz  Miralax is an osmotic laxative.  It only brings more water  into the stool.  This is safe to take daily.  Can take up to 17 gram of miralax twice a day.  Mix with juice or coffee.  Start 1/2 capful for 3-4 days and reassess your response in 3-4 days.  You  can increase and decrease the dose based on your response.  Remember, it can take up to 3-4 days to take effect OR for the effects to wear off.   I often pair this with benefiber in the morning to help assure the stool is not too loose.   FIBER SUPPLEMENT You can do metamucil or fibercon once or twice a day but if this causes gas/bloating please switch to Benefiber or Citracel.  Fiber is good for constipation/diarrhea/irritable bowel syndrome.  It can also help with weight loss and can help lower your bad cholesterol (LDL).  Please do 1 TBSP in the morning in water , coffee, or tea.  It can take up to a month before you can see a difference with your bowel movements.  It is cheapest from [employer], [employer], [employer].     FODMAP stands for fermentable oligo-, di-, mono-saccharides and polyols (1). These are the scientific terms used to classify groups of carbs that are difficult for our body to digest and that are notorious for triggering digestive symptoms like bloating, gas, loose stools and stomach pain.   You can try low FODMAP diet  - start with eliminating just one column at a time that you feel may be a trigger for you. - the table at the very bottom contains foods that are low in FODMAPs   Sometimes trying to eliminate the FODMAP's from your diet is difficult or tricky, if you are stuggling with trying to do the elimination diet you can try an enzyme.  There is a food enzymes that you sprinkle in or on your food that helps break down the FODMAP. You can read more about the enzyme by going to this site: https://fodzyme.com/  I appreciate the  opportunity to care for you  Thank You   [facility]

## 2024-03-29 LAB — IGA: Immunoglobulin A: 111 mg/dL (ref 70–320)

## 2024-03-29 LAB — TISSUE TRANSGLUTAMINASE, IGA: (tTG) Ab, IgA: <1 U/mL

## 2024-03-30 ENCOUNTER — Encounter: Payer: Self-pay | Admitting: Internal Medicine

## 2024-03-30 DIAGNOSIS — R109 Unspecified abdominal pain: Secondary | ICD-10-CM | POA: Insufficient documentation

## 2024-04-04 ENCOUNTER — Ambulatory Visit
Admission: RE | Admit: 2024-04-04 | Discharge: 2024-04-04 | Disposition: A | Discharge status: Home / Self Care | Source: Ambulatory Visit | Attending: Physician Assistant | Admitting: Physician Assistant

## 2024-04-04 DIAGNOSIS — K828 Other specified diseases of gallbladder: Secondary | ICD-10-CM | POA: Diagnosis not present

## 2024-04-04 DIAGNOSIS — R1013 Epigastric pain: Secondary | ICD-10-CM | POA: Diagnosis not present

## 2024-04-04 DIAGNOSIS — R140 Abdominal distension (gaseous): Secondary | ICD-10-CM | POA: Insufficient documentation

## 2024-04-04 DIAGNOSIS — R109 Unspecified abdominal pain: Secondary | ICD-10-CM | POA: Diagnosis not present

## 2024-04-08 ENCOUNTER — Ambulatory Visit: Admitting: Gastroenterology

## 2024-04-08 ENCOUNTER — Encounter: Payer: Self-pay | Admitting: Gastroenterology

## 2024-04-08 VITALS — BP 106/50 | HR 52 | Temp 97.3°F | Resp 12 | Ht 69.0 in | Wt 166.0 lb

## 2024-04-08 DIAGNOSIS — K3189 Other diseases of stomach and duodenum: Secondary | ICD-10-CM | POA: Diagnosis not present

## 2024-04-08 DIAGNOSIS — R1013 Epigastric pain: Secondary | ICD-10-CM

## 2024-04-08 DIAGNOSIS — K219 Gastro-esophageal reflux disease without esophagitis: Secondary | ICD-10-CM | POA: Diagnosis not present

## 2024-04-08 DIAGNOSIS — R140 Abdominal distension (gaseous): Secondary | ICD-10-CM

## 2024-04-08 DIAGNOSIS — K319 Disease of stomach and duodenum, unspecified: Secondary | ICD-10-CM | POA: Diagnosis not present

## 2024-04-08 DIAGNOSIS — K317 Polyp of stomach and duodenum: Secondary | ICD-10-CM | POA: Diagnosis not present

## 2024-04-08 DIAGNOSIS — I2510 Atherosclerotic heart disease of native coronary artery without angina pectoris: Secondary | ICD-10-CM | POA: Diagnosis not present

## 2024-04-08 DIAGNOSIS — I10 Essential (primary) hypertension: Secondary | ICD-10-CM | POA: Diagnosis not present

## 2024-04-08 MED ORDER — SODIUM CHLORIDE 0.9 % IV SOLN: 500.0000 mL | Freq: Once | INTRAVENOUS | Status: DC

## 2024-04-08 NOTE — Progress Notes (Signed)
 Agree with the assessment and plan as outlined by Quentin Mulling, PA-C. ? ?Keron Neenan, DO, FACG ? ?

## 2024-04-08 NOTE — Op Note (Signed)
 [facility] Patient Name: Alexandra Mendoza Procedure Date: 04/08/2024 1:14 PM MRN: [id number] Endoscopist: Sandor Flatter , MD, [id number] Age: 73 Referring MD:  Date of Birth: September 26, 1950 Gender: Female Account #: [id number] Procedure:                Upper GI endoscopy Indications:              Upper abdominal pain, Heartburn, Suspected                            esophageal reflux, Abdominal bloating                           Continued reflux symptoms despite pantoprazole  40                            mg twice daily. Medicines:                Monitored Anesthesia Care Procedure:                Pre-Anesthesia Assessment:                           - Prior to the procedure, a History and Physical                            was performed, and patient medications and                            allergies were reviewed. The patient's tolerance of                            previous anesthesia was also reviewed. The risks                            and benefits of the procedure and the sedation                            options and risks were discussed with the patient.                            All questions were answered, and informed consent                            was obtained. Prior Anticoagulants: The patient has                            taken no anticoagulant or antiplatelet agents. ASA                            Grade Assessment: III - A patient with severe                            systemic disease. After reviewing the risks and  benefits, the patient was deemed in satisfactory                            condition to undergo the procedure.                           After obtaining informed consent, the endoscope was                            passed under direct vision. Throughout the                            procedure, the patient's blood pressure, pulse, and                            oxygen saturations were monitored continuously. The                             GIF [id number] #[id number] was introduced through the                            mouth, and advanced to the second part of duodenum.                            The upper GI endoscopy was accomplished without                            difficulty. The patient tolerated the procedure                            well. Scope In: Scope Out: Findings:                 The examined esophagus was normal.                           The Z-line was regular and was found 37 cm from the                            incisors.                           The gastroesophageal flap valve was visualized                            endoscopically and classified as Hill Grade II                            (fold present, opens with respiration).                           Multiple small sessile polyps with no bleeding were                            found in the gastric fundus and in the gastric  body. These polyps were removed with a cold biopsy                            forceps. Resection and retrieval were complete.                            Estimated blood loss was minimal.                           Normal mucosa was found in the entire examined                            stomach. Biopsies were taken with a cold forceps                            for Helicobacter pylori testing. Estimated blood                            loss was minimal.                           The examined duodenum was normal. Biopsies were                            taken with a cold forceps for histology. Estimated                            blood loss was minimal. Complications:            No immediate complications. Estimated Blood Loss:     Estimated blood loss was minimal. Impression:               - Normal esophagus.                           - Z-line regular, 37 cm from the incisors.                           - Gastroesophageal flap valve classified as Hill                            Grade  II (fold present, opens with respiration).                           - No hiatal hernia was appreciated on this study on                            anterograde or retroflexed views and despite full                            air insufflation of the stomach and endoscopic                            agitation of the lower esophageal sphincter.                           -  Multiple gastric polyps. Resected and retrieved.                           - Normal mucosa was found in the entire stomach.                            Biopsied.                           - Normal examined duodenum. Biopsied. Recommendation:           - Patient has a contact number available for                            emergencies. The signs and symptoms of potential                            delayed complications were discussed with the                            patient. Return to normal activities tomorrow.                            Written discharge instructions were provided to the                            patient.                           - Resume previous diet.                           - Continue present medications.                           - Await pathology results.                           - Will follow-up on the ultrasound results. If                            unrevealing but continued upper GI symptoms,                            recommend HIDA scan to evaluate for biliary                            dyskinesia.                           - If ultrasound and HIDA unrevealing but continued                            breakthrough reflux symptoms, may consider                            esophageal manometry with pH/impedance study (off  PPI therapy) to evaluate grade and severity of                            esophageal acid exposure. Sandor Flatter, MD 04/08/2024 1:31:49 PM

## 2024-04-08 NOTE — Progress Notes (Signed)
 Report to PACU, RN, vss, BBS= Clear.

## 2024-04-08 NOTE — Progress Notes (Signed)
 GASTROENTEROLOGY PROCEDURE H&P NOTE   Primary Care Physician: Glendia Shad, MD    Reason for Procedure:  GERD, abdominal bloating, hiatal hernia, bloating, abdominal pain  Plan:    EGD  Patient is appropriate for endoscopic procedure(s) in the ambulatory ([facility]) setting.  The nature of the procedure, as well as the risks, benefits, and alternatives were carefully and thoroughly reviewed with the patient. Ample time for discussion and questions allowed. The patient understood, was satisfied, and agreed to proceed.     HPI: Alexandra Mendoza is a 73 y.o. female who presents for EGD for evaluation of GERD, abdominal bloating, abdominal pain, hiatal hernia.  Continues to have upper GI symptoms despite pantoprazole  40 mg twice daily for the last several months.  Unable to wean to lower dose.  Has been taking Pepcid  in the evening with some improvement..  Patient was most recently seen in the Gastroenterology Clinic on 03/28/2024 by Alan Coombs, PA-C.  Labs that day with normal CBC, CMP (calcium 10.8), and negative celiac panel.  Abdominal ultrasound obtained, but not yet read.  Otherwise, no interval change in medical history since that appointment. Please refer to that note for full details regarding GI history and clinical presentation.   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)   Heart murmur    History of chicken pox    Hyperlipidemia    Hypertension    Insomnia     Past Surgical History:  Procedure Laterality Date   APPENDECTOMY     BREAST EXCISIONAL BIOPSY Right    negative over 5 years ago- neg   BREAST SURGERY     Biopsy   COLONOSCOPY WITH PROPOFOL  N/A 05/16/2016   Procedure: COLONOSCOPY WITH PROPOFOL ;  Surgeon: Rogelia Copping, MD;  Location: [facility];  Service: Endoscopy;  Laterality: N/A;   CORONARY ARTERY BYPASS GRAFT  05/01/2014   3 vessel, [facility]    ESOPHAGOGASTRODUODENOSCOPY (EGD) WITH PROPOFOL  N/A 02/23/2017   Procedure: ESOPHAGOGASTRODUODENOSCOPY (EGD) WITH PROPOFOL ;  Surgeon: Copping Rogelia, MD;  Location: [facility];  Service: Endoscopy;  Laterality: N/A;   HYSTEROSCOPY WITH D & C     lipoma removed     removed from forehead   skin lesions removed  01/2023   triple bypass      Prior to Admission medications   Medication Sig Start Date End Date Taking? Authorizing Provider  Alpha-Lipoic Acid 100 MG CAPS Take 600 mg by mouth daily.    [provider]  amLODipine (NORVASC) 10 MG tablet TAKE 1 TABLET BY MOUTH EVERYDAY AT BEDTIME 08/21/23   Glendia Shad, MD  ascorbic acid (VITAMIN C) 1000 MG tablet Take by mouth.    [provider]  aspirin EC 81 MG tablet Take 81 mg by mouth daily. 10/04/12   [provider]  atorvastatin (LIPITOR) 40 MG tablet TAKE 1 TABLET BY MOUTH EVERY DAY 12/13/23   Glendia Shad, MD  calcium carbonate (OS-CAL) 1250 (500 Ca) MG chewable tablet Chew 1 tablet by mouth daily.    [provider]  CALCIUM MAGNESIUM  ZINC PO Take by mouth daily.    [provider]  carvedilol (COREG) 6.25 MG tablet TAKE 1 TABLET BY MOUTH TWICE A DAY WITH FOOD 08/21/23   Glendia Shad, MD  Cholecalciferol (VITAMIN D3) 1000 units CAPS 1 cap(s) Orally Once a day  [provider]  cyanocobalamin  1000 MCG tablet Take 1,000 mcg by mouth daily.    [provider]  Docusate Calcium (STOOL SOFTENER PO) Take by mouth.    [provider]  docusate sodium (COLACE) 100 MG capsule 1 capsule as needed Orally BID    [provider]  hydrochlorothiazide  (MICROZIDE ) 12.5 MG capsule Take 1 capsule (12.5 mg total) by mouth daily. 10/17/23   Glendia Shad, MD  irbesartan  (AVAPRO ) 300 MG tablet TAKE 1 TABLET BY MOUTH EVERY DAY 01/15/24   Glendia Shad, MD  Omega-3 Fatty Acids (FISH OIL) 1000 MG CAPS Take 1,200 mg by mouth daily.    [provider]   pantoprazole  (PROTONIX ) 40 MG tablet Take 80 mg by mouth every morning. 11/27/23   [provider]  pregabalin  (LYRICA ) 75 MG capsule Take 1 capsule (75 mg total) by mouth 2 (two) times daily. 02/17/24   Glendia Shad, MD  Probiotic Product (PROBIOTIC PO) Take by mouth.    [provider]  spironolactone (ALDACTONE) 25 MG tablet TAKE 1 TABLET BY MOUTH EVERY DAY 08/21/23   Glendia Shad, MD  traZODone  (DESYREL ) 50 MG tablet TAKE 1-2 TABLETS (50-100 MG TOTAL) BY MOUTH AT BEDTIME AS NEEDED. 03/19/24   Glendia Shad, MD    Current Outpatient Medications  Medication Sig Dispense Refill   Alpha-Lipoic Acid 100 MG CAPS Take 600 mg by mouth daily.     amLODipine (NORVASC) 10 MG tablet TAKE 1 TABLET BY MOUTH EVERYDAY AT BEDTIME 90 tablet 4   ascorbic acid (VITAMIN C) 1000 MG tablet Take by mouth.     aspirin EC 81 MG tablet Take 81 mg by mouth daily.     atorvastatin (LIPITOR) 40 MG tablet TAKE 1 TABLET BY MOUTH EVERY DAY 90 tablet 1   calcium carbonate (OS-CAL) 1250 (500 Ca) MG chewable tablet Chew 1 tablet by mouth daily.     CALCIUM MAGNESIUM  ZINC PO Take by mouth daily.     carvedilol (COREG) 6.25 MG tablet TAKE 1 TABLET BY MOUTH TWICE A DAY WITH FOOD 180 tablet 4   Cholecalciferol (VITAMIN D3) 1000 units CAPS 1 cap(s) Orally Once a day     cyanocobalamin  1000 MCG tablet Take 1,000 mcg by mouth daily.     Docusate Calcium (STOOL SOFTENER PO) Take by mouth.     docusate sodium (COLACE) 100 MG capsule 1 capsule as needed Orally BID     hydrochlorothiazide  (MICROZIDE ) 12.5 MG capsule Take 1 capsule (12.5 mg total) by mouth daily. 90 capsule 3   irbesartan  (AVAPRO ) 300 MG tablet TAKE 1 TABLET BY MOUTH EVERY DAY 90 tablet 0   Omega-3 Fatty Acids (FISH OIL) 1000 MG CAPS Take 1,200 mg by mouth daily.     pantoprazole  (PROTONIX ) 40 MG tablet Take 80 mg by mouth every morning.     pregabalin  (LYRICA ) 75 MG capsule Take 1 capsule (75 mg total) by mouth 2 (two) times daily. 60 capsule 2    Probiotic Product (PROBIOTIC PO) Take by mouth.     spironolactone (ALDACTONE) 25 MG tablet TAKE 1 TABLET BY MOUTH EVERY DAY 90 tablet 4   traZODone  (DESYREL ) 50 MG tablet TAKE 1-2 TABLETS (50-100 MG TOTAL) BY MOUTH AT BEDTIME AS NEEDED. 180 tablet 1   No current facility-administered medications for this visit.    Allergies as of 04/08/2024 - Review Complete 03/28/2024  Allergen Reaction Noted   Valsartan Hives and Rash 02/07/2019   Hydralazine   06/02/2021    Family History  Problem Relation Age of Onset   Heart disease Mother    Stroke Mother    Alzheimer's disease Mother    Heart disease Father    Stroke Father    Breast cancer Sister 57   Alzheimer's disease Maternal Grandmother    Alzheimer's disease Maternal Aunt    Alzheimer's disease Maternal Uncle    Colon cancer Neg Hx    Liver disease Neg Hx    Esophageal cancer Neg Hx     Social History   Socioeconomic History   Marital status: Married    Spouse name: Not on file   Number of children: Not on file   Years of education: Not on file   Highest education level: Not on file  Occupational History   Not on file  Tobacco Use   Smoking status: Never   Smokeless tobacco: Never  Vaping Use   Vaping status: Never Used  Substance and Sexual Activity   Alcohol use: Never    Alcohol/week: 0.0 standard drinks of alcohol   Drug use: No   Sexual activity: Yes    Birth control/protection: Post-menopausal  Other Topics Concern   Not on file  Social History Narrative   Married   Social Drivers of Health   Financial Resource Strain: Low Risk  (10/08/2023)   Overall Financial Resource Strain (CARDIA)    Difficulty of Paying Living Expenses: Not hard at all  Food Insecurity: No Food Insecurity (10/08/2023)   Hunger Vital Sign    Worried About Running Out of Food in the Last Year: Never true    Ran Out of Food in the Last Year: Never true  Transportation Needs: No Transportation Needs (10/08/2023)   PRAPARE -  Administrator, Civil Service (Medical): No    Lack of Transportation (Non-Medical): No  Physical Activity: Insufficiently Active (10/08/2023)   Exercise Vital Sign    Days of Exercise per Week: 3 days    Minutes of Exercise per Session: 40 min  Stress: No Stress Concern Present (10/08/2023)   [employer] of Occupational Health - Occupational Stress Questionnaire    Feeling of Stress : Not at all  Social Connections: Moderately Integrated (10/08/2023)   Social Connection and Isolation Panel    Frequency of Communication with Friends and Family: More than three times a week    Frequency of Social Gatherings with Friends and Family: More than three times a week    Attends Religious Services: More than 4 times per year    Active Member of [employer] or Organizations: No    Attends Banker Meetings: Never    Marital Status: Married  Catering manager Violence: Not At Risk (10/08/2023)   Humiliation, Afraid, Rape, and Kick questionnaire    Fear of Current or Ex-Partner: No    Emotionally Abused: No    Physically Abused: No    Sexually Abused: No    Physical Exam: Vital signs in last 24 hours: @There  were no vitals taken for this visit. GEN: NAD EYE: Sclerae anicteric ENT: MMM CV: Non-tachycardic Pulm: CTA b/l GI: Soft, NT/ND NEURO:  Alert & Oriented x 3   Sandor Flatter, DO [facility]   04/08/2024 12:06 PM

## 2024-04-08 NOTE — Progress Notes (Signed)
 Pt's states no medical or surgical changes since previsit or office visit.   Pt stated she had an abdominal ultrasound on Friday and has not received the results.  RN will notify Dr. San.

## 2024-04-08 NOTE — Patient Instructions (Signed)

## 2024-04-09 ENCOUNTER — Telehealth: Payer: Self-pay | Admitting: *Deleted

## 2024-04-09 NOTE — Telephone Encounter (Signed)
  Follow up Call-     04/08/2024   12:21 PM  Call back number  Post procedure Call Back phone  # [phone]  Permission to leave phone message Yes     Patient questions:  Do you have a fever, pain , or abdominal swelling? No. Pain Score  0 *  Have you tolerated food without any problems? Yes.    Have you been able to return to your normal activities? Yes.    Do you have any questions about your discharge instructions: Diet   No. Medications  No. Follow up visit  No.  Do you have questions or concerns about your Care? No.  Actions: * If pain score is 4 or above: No action needed, pain <4.

## 2024-04-11 LAB — SURGICAL PATHOLOGY

## 2024-04-12 ENCOUNTER — Other Ambulatory Visit: Payer: Self-pay | Admitting: Internal Medicine

## 2024-04-14 ENCOUNTER — Ambulatory Visit: Payer: Self-pay | Admitting: Gastroenterology

## 2024-04-18 ENCOUNTER — Telehealth: Payer: Self-pay | Admitting: Gastroenterology

## 2024-04-18 ENCOUNTER — Telehealth: Payer: Self-pay | Admitting: Cardiovascular Disease

## 2024-04-18 NOTE — Telephone Encounter (Signed)
 Inbound call from patient stating she was referred to [facility] to have her gallbladder removed but when she called to schedule an appointment patient stated they haven't received one. Informed patient on when it was sent and she stated she having tightness underneath breast  Requesting a call back   Please advise  Thank you

## 2024-04-18 NOTE — Telephone Encounter (Signed)
 Called and spoke with patient. Patient states that [facility] informed her that they are caught up on referrals and have not received hers. I advised that we will refax referral today, pt can contact [facility] next week to follow up on referral. Patient states that the chest tightness is not really a new symptom but it was very bothersome yesterday. Patient felt tightness underneath her ribs from (R) to (L). Patient is currently taking Pantoprazole  40 mg BID, Pepcid  at bedtime, and Reflux gourmet PRN. Patient has been trying to follow a healthier diet. Tightness has subsided today but patient has (R) shoulder pain. Patient has an extensive cardiac history and I advised her to call her cardiology office to get their advice from cardiac standpoint. Patient has been advised to let us  know if she develops any nausea or vomiting after eating, this could possibly be gallbladder related. Patient verbalized understanding and had no concerns at the end of the call.  Nyla, can you please refax original [facility] referral that was placed on 04/14/24? Thanks

## 2024-04-18 NOTE — Telephone Encounter (Signed)
 Spoke with patient and she reports some tightness under her breasts. She just had endoscopy done which identified reflux, distended abdomen and the presence of gall stones. She just wanted to make sure this discomfort is not coming from her heart. She does report it is worse after meals and at night. Advised that is difficult to determine because all are symptoms of both. Discussed strict ED precautions and she is scheduled to come in on Tuesday. She verbalized understanding of our conversation with no further questions at this time.

## 2024-04-18 NOTE — Telephone Encounter (Signed)
 [facility] referral refaxed & copy placed on [place], RN's desk.

## 2024-04-18 NOTE — Telephone Encounter (Signed)
 Patient stated she has developed gall stones and has been having symptoms including reflux and wants a call back to discuss next steps.  Patient has appointment scheduled on 8/12.

## 2024-04-22 ENCOUNTER — Encounter: Payer: Self-pay | Admitting: Nurse Practitioner

## 2024-04-22 ENCOUNTER — Ambulatory Visit: Attending: Nurse Practitioner | Admitting: Nurse Practitioner

## 2024-04-22 ENCOUNTER — Ambulatory Visit: Payer: Self-pay

## 2024-04-22 VITALS — BP 121/72 | HR 68 | Ht 70.0 in | Wt 162.0 lb

## 2024-04-22 DIAGNOSIS — Z01810 Encounter for preprocedural cardiovascular examination: Secondary | ICD-10-CM

## 2024-04-22 DIAGNOSIS — I340 Nonrheumatic mitral (valve) insufficiency: Secondary | ICD-10-CM | POA: Diagnosis not present

## 2024-04-22 DIAGNOSIS — E782 Mixed hyperlipidemia: Secondary | ICD-10-CM | POA: Diagnosis not present

## 2024-04-22 DIAGNOSIS — K8020 Calculus of gallbladder without cholecystitis without obstruction: Secondary | ICD-10-CM | POA: Diagnosis not present

## 2024-04-22 DIAGNOSIS — I2510 Atherosclerotic heart disease of native coronary artery without angina pectoris: Secondary | ICD-10-CM | POA: Diagnosis not present

## 2024-04-22 DIAGNOSIS — I10 Essential (primary) hypertension: Secondary | ICD-10-CM | POA: Diagnosis not present

## 2024-04-22 DIAGNOSIS — K219 Gastro-esophageal reflux disease without esophagitis: Secondary | ICD-10-CM

## 2024-04-22 NOTE — Progress Notes (Signed)
 Office Visit    Patient Name: Alexandra Mendoza Date of Encounter: 04/22/2024  Primary Care Provider:  Glendia Shad, MD Primary Cardiologist:  Evalene Lunger, MD    Chief Complaint    73 y.o. female w/ a h/o CAD s/p CABG x 3 in 2015, post-op afib, HTN, HL, and moderate MR, who presents for f/u related to abdominal bloating and epigastric pain.  Past Medical History   Subjective   Past Medical History:  Diagnosis Date   Acid reflux    Atrial fibrillation (post-op CABG 2015)    Colon polyp [K63.5]    Coronary artery disease    a. 2015 CABG x 3: LIMA->LAD, VG->RI->OM1; b. 10/2022 MV: EF 65%, no isch/infarct.   Heart murmur    History of chicken pox    Hyperlipidemia    Hypertension    Insomnia    Moderate mitral regurgitation    a. 10/2022 Echo: EF >55%, mod MR.   Past Surgical History:  Procedure Laterality Date   APPENDECTOMY     BREAST EXCISIONAL BIOPSY Right    negative over 5 years ago- neg   BREAST SURGERY     Biopsy   COLONOSCOPY WITH PROPOFOL  N/A 05/16/2016   Procedure: COLONOSCOPY WITH PROPOFOL ;  Surgeon: Rogelia Copping, MD;  Location: [facility];  Service: Endoscopy;  Laterality: N/A;   CORONARY ARTERY BYPASS GRAFT  05/01/2014   3 vessel, [facility]   ESOPHAGOGASTRODUODENOSCOPY (EGD) WITH PROPOFOL  N/A 02/23/2017   Procedure: ESOPHAGOGASTRODUODENOSCOPY (EGD) WITH PROPOFOL ;  Surgeon: Copping Rogelia, MD;  Location: [facility];  Service: Endoscopy;  Laterality: N/A;   HYSTEROSCOPY WITH D & C     lipoma removed     removed from forehead   skin lesions removed  01/2023   triple bypass      Allergies  Allergies  Allergen Reactions   Valsartan Hives and Rash   Hydralazine  Other (See Comments)    Other reaction(s): Dizziness Patient felt like she was going to pass out        History of Present Illness      73 y.o. y/o female with a history of CAD, hypertension, hyperlipidemia, moderate mitral regurgitation, postoperative atrial  fibrillation, and GERD. Pt previously underwent CABG x 3 in 2015.  Prior notes indicate that she had post-op Afib.  She was previously followed at [facility] clinic.  Stress testing from 2024 showed normal LV function with no ischemia or infarct.  Echo in February 2024 showed normal LV function with moderate MR.  She established care with Dr. Gollan in 2024.    Alexandra Mendoza was last seen in cardiology clinic in November 2024, at which time she reported GERD-like symptoms and was advised to try Pepcid .  She felt that this helped initially but over the past 2-3 mos, she has been having post-prandial abdominal bloating and discomfort under her breasts occurring most nights of the week after eating, lasting a few hrs, and improved after taking evening dose of pepcid .  She recently underwent endoscopy in July, which showed a normal esophagus and multiple gastric polyps, which were biopsied.  Abd ultrasound showed gallbladder stones and sludge w/o acute cholecystitis or ductal dilatation.  She is seeing general surgery later this week to discuss possible cholecystectomy.  Despite postprandial symptoms, she walks for 30 minutes approximately 5 days/week without symptoms or limitations.  She denies chest pain, dyspnea, palpitations, PND, orthopnea, dizziness, syncope, edema, or early satiety.  She does not experience any dyspnea with abdominal  symptoms.  Due to abdominal symptoms, she just want to make sure her heart was doing okay, prompting presentation today.  She has adjusted the timing of her evening meal and has not had symptoms in several days.  Of note, over the past 3 days, she stopped taking hydrochlorothiazide  and spironolactone in the setting of intermittent lightheadedness in the mornings with blood pressures in the 90s.  Pressure is 121/72 today.   Objective   Home Medications    Current Outpatient Medications  Medication Sig Dispense Refill   Alpha-Lipoic Acid 100 MG CAPS Take 600 mg by mouth daily.      amLODipine (NORVASC) 10 MG tablet TAKE 1 TABLET BY MOUTH EVERYDAY AT BEDTIME 90 tablet 4   ascorbic acid (VITAMIN C) 1000 MG tablet Take by mouth.     aspirin EC 81 MG tablet Take 81 mg by mouth daily.     atorvastatin (LIPITOR) 40 MG tablet TAKE 1 TABLET BY MOUTH EVERY DAY 90 tablet 1   calcium carbonate (OS-CAL) 1250 (500 Ca) MG chewable tablet Chew 1 tablet by mouth daily.     CALCIUM MAGNESIUM  ZINC PO Take by mouth daily.     carvedilol (COREG) 6.25 MG tablet TAKE 1 TABLET BY MOUTH TWICE A DAY WITH FOOD 180 tablet 4   Cholecalciferol (VITAMIN D3) 1000 units CAPS 1 cap(s) Orally Once a day     cyanocobalamin  1000 MCG tablet Take 1,000 mcg by mouth daily.     docusate sodium (COLACE) 100 MG capsule 1 capsule as needed Orally BID     hydrochlorothiazide  (MICROZIDE ) 12.5 MG capsule Take 1 capsule (12.5 mg total) by mouth daily. 90 capsule 3   irbesartan  (AVAPRO ) 300 MG tablet TAKE 1 TABLET BY MOUTH EVERY DAY 90 tablet 0   Omega-3 Fatty Acids (FISH OIL) 1000 MG CAPS Take 1,200 mg by mouth daily.     pantoprazole  (PROTONIX ) 40 MG tablet Take 80 mg by mouth every morning. (Patient taking differently: Take 80 mg by mouth every morning.)     pregabalin  (LYRICA ) 75 MG capsule Take 1 capsule (75 mg total) by mouth 2 (two) times daily. 60 capsule 2   Probiotic Product (PROBIOTIC PO) Take by mouth.     spironolactone (ALDACTONE) 25 MG tablet TAKE 1 TABLET BY MOUTH EVERY DAY 90 tablet 4   traZODone  (DESYREL ) 50 MG tablet TAKE 1-2 TABLETS (50-100 MG TOTAL) BY MOUTH AT BEDTIME AS NEEDED. 180 tablet 1   No current facility-administered medications for this visit.     Physical Exam    VS:  BP 121/72 (BP Location: Left Arm, Patient Position: Sitting, Cuff Size: Normal)   Pulse 68   Ht 5' 10 (1.778 m)   Wt 162 lb (73.5 kg)   SpO2 94%   BMI 23.24 kg/m  , BMI Body mass index is 23.24 kg/m.          GEN: Well nourished, well developed, in no acute distress. HEENT: normal. Neck: Supple, no JVD,  carotid bruits, or masses. Cardiac: RRR, 2/6 systolic murmur heard throughout.  No rubs or gallops. No clubbing, cyanosis, edema.  Radials 2+/PT 2+ and equal bilaterally.  Respiratory:  Respirations regular and unlabored, clear to auscultation bilaterally. GI: Soft, nontender, nondistended, BS + x 4. MS: no deformity or atrophy. Skin: warm and dry, no rash. Neuro:  Strength and sensation are intact. Psych: Normal affect.  Accessory Clinical Findings    ECG personally reviewed by me today - EKG Interpretation Date/Time:  Tuesday April 22 2024 09:07:34 EDT Ventricular Rate:  68 PR Interval:  148 QRS Duration:  84 QT Interval:  402 QTC Calculation: 427 R Axis:   51  Text Interpretation: Normal sinus rhythm Septal infarct (cited on or before 22-Apr-2024) Nonspecific T wave abnormality Confirmed by Vivienne Bruckner [id number]) on 04/22/2024 9:10:47 AM  - no acute changes.  Lab Results  Component Value Date   WBC 6.8 03/28/2024   HGB 12.9 03/28/2024   HCT 38.0 03/28/2024   MCV 91.1 03/28/2024   PLT 183.0 03/28/2024   Lab Results  Component Value Date   CREATININE 1.05 03/28/2024   BUN 18 03/28/2024   NA 136 03/28/2024   K 4.7 03/28/2024   CL 100 03/28/2024   CO2 29 03/28/2024   Lab Results  Component Value Date   ALT 16 03/28/2024   AST 17 03/28/2024   ALKPHOS 42 03/28/2024   BILITOT 0.6 03/28/2024   Lab Results  Component Value Date   CHOL 99 02/11/2024   HDL 34.10 (L) 02/11/2024   LDLCALC 48 02/11/2024   TRIG 84.0 02/11/2024   CHOLHDL 3 02/11/2024    No results found for: HGBA1C Lab Results  Component Value Date   TSH 3.34 06/11/2023       Assessment & Plan    1.  Abdominal bloating and epigastric pain/GERD/cholelithiasis/preoperative cardiovascular exam: Patient with a prior history of GERD historically managed with Protonix .  She was placed on twice daily Pepcid  in February and that was helping abdominal bloating and GERD like symptoms but over the past 2  to 3 months, symptoms have been occurring most nights of the week, exclusively after her evening meal, lasting several hours, and resolving after her p.m. dose of Pepcid .  Symptoms started with abdominal bloating and then move up her abdomen into the epigastric area and also under her breasts.  She does not experience any dyspnea with the symptoms and in no way are they reminiscent of prior angina.  She recently underwent EGD which showed gastropathy.  Esophagus was normal.  Abdominal ultrasound showed cholelithiasis without cholecystitis.  She plans to see general surgery later this week to discuss possible cholecystectomy.  From a cardiac standpoint, she has done well since her bypass surgery in 2015.  She had a negative Myoview in 2024 for similar symptoms as reported above.  She is able to walk 30 minutes daily without experiencing chest pain or dyspnea.  She is capable of achieving 8 METS.  ECG is unremarkable.  RCRI calculates to 0.9% of major adverse cardiac event in the setting of noncardiac surgery.  She does not require any further ischemic evaluation prior to potential surgery.  She should continue aspirin and statin throughout perioperative period.  2.  CAD: Status post CABG x 3 in 2015 with negative Myoview in February 2024.  Having postprandial abdominal and epigastric symptoms as outlined above.  Currently managed with Protonix  and Pepcid .  She is not having any exertional angina or dyspnea and remains active, walking 5 times per week.  ECG unremarkable.  No further ischemic evaluation warranted at this time.  She remains on aspirin, statin, ARB, and calcium channel blocker.  2.  Primary hypertension: Stable on calcium channel blocker and ARB.  She started holding spironolactone and HCTZ last week due to intermittent lightheadedness in the mornings associated with systolic pressures in the 90s.  She will continue to hold these and monitor blood pressure at home, which is normal today at  121/72.  3.  Hyperlipidemia: LDL 48  in June 2025.  LFTs normal in July.  4.  Moderate mitral regurgitation: Moderate MR on echo in February of 2024.  Asymptomatic.  Plan for follow-up echo within the next few weeks.  5.  Disposition: Follow-up echocardiogram.  Follow-up in 6 months or sooner if necessary.  Lonni Meager, NP 04/22/2024, 9:11 AM

## 2024-04-22 NOTE — Patient Instructions (Signed)
 Medication Instructions:  STOP Spironolactone  STOP hydrochlorothiazide    *If you need a refill on your cardiac medications before your next appointment, please call your pharmacy*  Testing/Procedures:  Your physician has requested that you have an echocardiogram. Echocardiography is a painless test that uses sound waves to create images of your heart. It provides your doctor with information about the size and shape of your heart and how well your heart's chambers and valves are working.   You may receive an ultrasound enhancing agent through an IV if needed to better visualize your heart during the echo. This procedure takes approximately one hour.  There are no restrictions for this procedure.  This will take place at [address] [facility], [place] [address]  Please note: We ask at that you not bring children with you during ultrasound (echo/ vascular) testing. Due to room size and safety concerns, children are not allowed in the ultrasound rooms during exams. Our front office staff cannot provide observation of children in our lobby area while testing is being conducted. An adult accompanying a patient to their appointment will only be allowed in the ultrasound room at the discretion of the ultrasound technician under special circumstances. We apologize for any inconvenience.   Follow-Up: At [facility], you and your health needs are our priority.  As part of our continuing mission to provide you with exceptional heart care, our providers are all part of one team.  This team includes your primary Cardiologist (physician) and Advanced Practice Providers or APPs (Physician Assistants and Nurse Practitioners) who all work together to provide you with the care you need, when you need it.  Your next appointment:   6 month(s)  Provider:   Timothy Gollan, MD    We recommend signing up for the patient portal called MyChart.  Sign up information is provided  on this After Visit Summary.  MyChart is used to connect with patients for Virtual Visits (Telemedicine).  Patients are able to view lab/test results, encounter notes, upcoming appointments, etc.  Non-urgent messages can be sent to your provider as well.   To learn more about what you can do with MyChart, go to [email or web address].

## 2024-04-23 ENCOUNTER — Encounter: Payer: Self-pay | Admitting: Internal Medicine

## 2024-04-25 ENCOUNTER — Telehealth: Payer: Self-pay | Admitting: Nurse Practitioner

## 2024-04-25 DIAGNOSIS — Z951 Presence of aortocoronary bypass graft: Secondary | ICD-10-CM | POA: Diagnosis not present

## 2024-04-25 DIAGNOSIS — I10 Essential (primary) hypertension: Secondary | ICD-10-CM | POA: Diagnosis not present

## 2024-04-25 DIAGNOSIS — K8020 Calculus of gallbladder without cholecystitis without obstruction: Secondary | ICD-10-CM | POA: Diagnosis not present

## 2024-04-25 DIAGNOSIS — I25810 Atherosclerosis of coronary artery bypass graft(s) without angina pectoris: Secondary | ICD-10-CM | POA: Diagnosis not present

## 2024-04-25 DIAGNOSIS — R12 Heartburn: Secondary | ICD-10-CM | POA: Diagnosis not present

## 2024-04-25 NOTE — Telephone Encounter (Signed)
   Pre-operative Risk Assessment    Patient Name: Alexandra Mendoza  DOB: [birth year] MRN: [id number]   Date of last office visit: 04/22/24 Date of next office visit: n/a   Request for Surgical Clearance    Procedure:  lap cholecystectomy  Date of Surgery:  Clearance TBD                                Surgeon:  Camellia Blush, MD Surgeon's Group or Practice Name:  [facility] Phone number:  [phone] Fax number:  [phone]   Type of Clearance Requested:   - Medical    Type of Anesthesia:  General    Additional requests/questions:    SignedTinnie NOVAK Schools   04/25/2024, 4:45 PM

## 2024-04-28 NOTE — Telephone Encounter (Signed)
 Preoperative clearance addressed during office visit on 04/22/2024 by Medford Meager, NP.  Office note faxed to requesting office.

## 2024-05-02 ENCOUNTER — Ambulatory Visit: Admitting: Physician Assistant

## 2024-05-09 ENCOUNTER — Ambulatory Visit (INDEPENDENT_AMBULATORY_CARE_PROVIDER_SITE_OTHER): Admitting: Gastroenterology

## 2024-05-09 ENCOUNTER — Ambulatory Visit: Payer: Self-pay | Admitting: General Surgery

## 2024-05-09 ENCOUNTER — Encounter: Payer: Self-pay | Admitting: Gastroenterology

## 2024-05-09 VITALS — BP 140/60 | HR 60 | Ht 68.25 in | Wt 163.5 lb

## 2024-05-09 DIAGNOSIS — K8020 Calculus of gallbladder without cholecystitis without obstruction: Secondary | ICD-10-CM

## 2024-05-09 DIAGNOSIS — R1013 Epigastric pain: Secondary | ICD-10-CM | POA: Diagnosis not present

## 2024-05-09 DIAGNOSIS — R140 Abdominal distension (gaseous): Secondary | ICD-10-CM | POA: Diagnosis not present

## 2024-05-09 DIAGNOSIS — K219 Gastro-esophageal reflux disease without esophagitis: Secondary | ICD-10-CM

## 2024-05-09 NOTE — Patient Instructions (Addendum)
 GERD -Continue Pantoprazole  40 mg twice daily - continue Pepcid  prn - continue OTC reflux gourmet  Reevaluate after procedure to see how symptoms are   Constipation  -Continue Bene fiber po daily - Continue Miralax po daily, titrate as needed  _______________________________________________________  If your blood pressure at your visit was 140/90 or greater, please contact your primary care physician to follow up on this.  _______________________________________________________  If you are age 73 or older, your body mass index should be between 23-30. Your Body mass index is 24.68 kg/m. If this is out of the aforementioned range listed, please consider follow up with your Primary Care Provider.  If you are age 55 or younger, your body mass index should be between 19-25. Your Body mass index is 24.68 kg/m. If this is out of the aformentioned range listed, please consider follow up with your Primary Care Provider.   ________________________________________________________  The [facility] providers would like to encourage you to use MYCHART to communicate with providers for non-urgent requests or questions.  Due to long hold times on the telephone, sending your provider a message by [facility] may be a faster and more efficient way to get a response.  Please allow 48 business hours for a response.  Please remember that this is for non-urgent requests.  _______________________________________________________  [facility] is using a team-based approach to care.  Your team is made up of your doctor and two to three APPS. Our APPS (Nurse Practitioners and Physician Assistants) work with your physician to ensure care continuity for you. They are fully qualified to address your health concerns and develop a treatment plan. They communicate directly with your gastroenterologist to care for you. Seeing the Advanced Practice Practitioners on your physician's team can help you by  facilitating care more promptly, often allowing for earlier appointments, access to diagnostic testing, procedures, and other specialty referrals.   Thank you for trusting me with your gastrointestinal care. Deanna May, FNP-C

## 2024-05-09 NOTE — Progress Notes (Signed)
 Chief Complaint:follow-up  Primary GI Doctor: Dr. San  HPI:  Patient is a  73  year old female patient with past medical history of GERD, Atrial fibrillation, hypertension, hyperlipidemia, who presents for follow-up Patient last saw Alan, [place] on 03/28/24 for abd bloating, pain radiating to breast/back, and GERD.  04/25/24 seen by [facility] for distended gallbladder, multiple shadowing stones. Symptoms consistent with cholelithiasis and biliary colic.Cleared for surgery by cardiologist patient would like to wait until she has her echocardiogram on September 3.Schedule laparoscopic cholecystectomy after September 3rd echocardiogram.   Interval History    Patient presents for follow-up of GERD and chronic constipation, accompanied by her husband. Patient has history of GERD and recently had increase in symptoms that radiated to her breast and shoulder. Patients Pantoprazole  40 mg increased to twice daily and added prn medications Pepcid  and Otc reflux gourmet. Patient had abd u/s that showed gallstones and referred to [facility]. Symptoms consistent with cholelithiasis and biliary colic. Patient scheduled for laparoscopic cholecystectomy on scheduled for 06/12/24.      Patient also has chronic constipation and taking Bene fiber with Miralax po daily. She reports this has allowed her to be more regular. She has bowel movement every 2-3 days. No abd pain or blood in stool.   No other concerns at this time.   RELEVANT GI HISTORY, IMAGING AND LABS: Results   RADIOLOGY Abdominal CT: Left adrenal myolipoma, benign; gallstones in gallbladder, no acute inflammation (06/2021) 04/04/24 US  Abd complete: Gallbladder stones and sludge. No further sonographic evidence of acute cholecystitis or ductal dilatation. Tiny cystic focus in the left kidney measuring 13 mm.   DIAGNOSTIC 04/08/24 EGD:  Normal esophagus.  Z- line regular, 37 cm from the incisors. Gastroesophageal flap valve classified as Hill Grade II (  fold present, opens with respiration) . No hiatal hernia was appreciated on this study on anterograde or retroflexed views and despite full air insufflation of the stomach and endoscopic agitation of the lower esophageal sphincter.  Multiple gastric polyps. Resected and retrieved. Normal mucosa was found in the entire stomach. Biopsied. Normal examined duodenum. Biopsied. Path: Polyps resected from stomach were benign. Neg h pylori. Biopsies from small intestine neg for celiac. Biopsies from stomach neg.  Endoscopy: Small hiatal hernia, normal stomach, normal duodenum, no biopsies taken (02/23/2017) Echocardiogram: Normal (10/2023) Colonoscopy: Small inflammatory polyp, benign (2017)    Wt Readings from Last 3 Encounters:  05/09/24 163 lb 8 oz (74.2 kg)  04/22/24 162 lb (73.5 kg)  04/08/24 166 lb (75.3 kg)    Past Medical History:  Diagnosis Date   Acid reflux    Atrial fibrillation (post-op CABG 2015)    Colon polyp [K63.5]    Coronary artery disease    a. 2015 CABG x 3: LIMA->LAD, VG->RI->OM1; b. 10/2022 MV: EF 65%, no isch/infarct.   Heart murmur    History of chicken pox    Hyperlipidemia    Hypertension    Insomnia    Moderate mitral regurgitation    a. 10/2022 Echo: EF >55%, mod MR.    Past Surgical History:  Procedure Laterality Date   APPENDECTOMY     BREAST EXCISIONAL BIOPSY Right    negative over 5 years ago- neg   BREAST SURGERY     Biopsy   COLONOSCOPY WITH PROPOFOL  N/A 05/16/2016   Procedure: COLONOSCOPY WITH PROPOFOL ;  Surgeon: Rogelia Copping, MD;  Location: [facility];  Service: Endoscopy;  Laterality: N/A;   CORONARY ARTERY BYPASS GRAFT  05/01/2014   3 vessel,  [facility]   ESOPHAGOGASTRODUODENOSCOPY (EGD) WITH PROPOFOL  N/A 02/23/2017   Procedure: ESOPHAGOGASTRODUODENOSCOPY (EGD) WITH PROPOFOL ;  Surgeon: Jinny Carmine, MD;  Location: [facility];  Service: Endoscopy;  Laterality: N/A;   HYSTEROSCOPY WITH D & C     lipoma removed     removed  from forehead   skin lesions removed  01/2023   triple bypass      Current Outpatient Medications  Medication Sig Dispense Refill   Alpha-Lipoic Acid 100 MG CAPS Take 600 mg by mouth daily.     amLODipine (NORVASC) 10 MG tablet TAKE 1 TABLET BY MOUTH EVERYDAY AT BEDTIME 90 tablet 4   ascorbic acid (VITAMIN C) 1000 MG tablet Take by mouth.     aspirin EC 81 MG tablet Take 81 mg by mouth daily.     atorvastatin (LIPITOR) 40 MG tablet TAKE 1 TABLET BY MOUTH EVERY DAY 90 tablet 1   calcium carbonate (OS-CAL) 1250 (500 Ca) MG chewable tablet Chew 1 tablet by mouth daily.     CALCIUM MAGNESIUM  ZINC PO Take by mouth daily.     carvedilol (COREG) 6.25 MG tablet TAKE 1 TABLET BY MOUTH TWICE A DAY WITH FOOD 180 tablet 4   Cholecalciferol (VITAMIN D3) 1000 units CAPS 1 cap(s) Orally Once a day     cyanocobalamin  1000 MCG tablet Take 1,000 mcg by mouth daily.     docusate sodium (COLACE) 100 MG capsule Take 200 mg by mouth daily.     famotidine  (PEPCID ) 20 MG tablet Take 20 mg by mouth as needed for heartburn or indigestion.     hydrochlorothiazide  (MICROZIDE ) 12.5 MG capsule Take 12.5 mg by mouth daily.     irbesartan  (AVAPRO ) 300 MG tablet TAKE 1 TABLET BY MOUTH EVERY DAY 90 tablet 0   Omega-3 Fatty Acids (FISH OIL) 1000 MG CAPS Take 1,200 mg by mouth daily.     pantoprazole  (PROTONIX ) 40 MG tablet Take 40 mg by mouth 2 (two) times daily.     pregabalin  (LYRICA ) 75 MG capsule Take 1 capsule (75 mg total) by mouth 2 (two) times daily. 60 capsule 2   Probiotic Product (PROBIOTIC PO) Take by mouth.     traZODone  (DESYREL ) 50 MG tablet TAKE 1-2 TABLETS (50-100 MG TOTAL) BY MOUTH AT BEDTIME AS NEEDED. 180 tablet 1   spironolactone (ALDACTONE) 25 MG tablet Take 25 mg by mouth daily. (Patient not taking: Reported on 05/09/2024)     traMADol (ULTRAM) 50 MG tablet Take 50 mg by mouth as needed. (Patient not taking: Reported on 05/09/2024)     No current facility-administered medications for this visit.     Allergies as of 05/09/2024 - Review Complete 05/09/2024  Allergen Reaction Noted   Valsartan Hives and Rash 02/07/2019   Hydralazine  Other (See Comments) 06/02/2021    Family History  Problem Relation Age of Onset   Heart disease Mother    Stroke Mother    Alzheimer's disease Mother    Heart disease Father    Stroke Father    Breast cancer Sister 63   Alzheimer's disease Maternal Grandmother    Alzheimer's disease Maternal Aunt    Alzheimer's disease Maternal Uncle    Colon cancer Neg Hx    Liver disease Neg Hx    Esophageal cancer Neg Hx     Review of Systems:    Constitutional: No weight loss, fever, chills, weakness or fatigue HEENT: Eyes: No change in vision  Ears, Nose, Throat:  No change in hearing or congestion Skin: No rash or itching Cardiovascular: No chest pain, chest pressure or palpitations   Respiratory: No SOB or cough Gastrointestinal: See HPI and otherwise negative Genitourinary: No dysuria or change in urinary frequency Neurological: No headache, dizziness or syncope Musculoskeletal: No new muscle or joint pain Hematologic: No bleeding or bruising Psychiatric: No history of depression or anxiety    Physical Exam:  Vital signs: BP (!) 140/60 (BP Location: Left Arm, Patient Position: Sitting, Cuff Size: Normal)   Pulse 60   Ht 5' 8.25 (1.734 m) Comment: height measured without shoes  Wt 163 lb 8 oz (74.2 kg)   BMI 24.68 kg/m   Constitutional:  Pleasant female appears to be in NAD, Well developed, Well nourished, alert and cooperative Throat: Oral cavity and pharynx without inflammation, swelling or lesion.  Respiratory: Respirations even and unlabored. Lungs clear to auscultation bilaterally.   No wheezes, crackles, or rhonchi.  Cardiovascular: Normal S1, S2. Regular rate and rhythm. No peripheral edema, cyanosis or pallor.  Gastrointestinal:  Soft, nondistended, nontender. No rebound or guarding. Normal bowel sounds. No  appreciable masses or hepatomegaly. Rectal:  Not performed.  Msk:  Symmetrical without gross deformities. Without edema, no deformity or joint abnormality.  Neurologic:  Alert and  oriented x4;  grossly normal neurologically.  Skin:   Dry and intact without significant lesions or rashes.  RELEVANT LABS AND IMAGING: CBC    Latest Ref Rng & Units 03/28/2024   10:27 AM 02/11/2024    8:53 AM 02/06/2023    7:59 AM  CBC  WBC 4.0 - 10.5 K/uL 6.8  5.1  6.2   Hemoglobin 12.0 - 15.0 g/dL 87.0  87.3  87.5   Hematocrit 36.0 - 46.0 % 38.0  37.7  37.3   Platelets 150.0 - 400.0 K/uL 183.0  162.0  190.0      CMP     Latest Ref Rng & Units 03/28/2024   10:27 AM 02/11/2024    8:53 AM 10/15/2023    8:06 AM  CMP  Glucose 70 - 99 mg/dL 97  88  84   BUN 6 - 23 mg/dL 18  18  19    Creatinine 0.40 - 1.20 mg/dL 8.94  9.05  9.05   Sodium 135 - 145 mEq/L 136  137  140   Potassium 3.5 - 5.1 mEq/L 4.7  4.3  4.2   Chloride 96 - 112 mEq/L 100  102  103   CO2 19 - 32 mEq/L 29  30  30    Calcium 8.4 - 10.5 mg/dL 89.1  89.6  89.9   Total Protein 6.0 - 8.3 g/dL 7.2  6.7  6.8   Total Bilirubin 0.2 - 1.2 mg/dL 0.6  0.6  0.5   Alkaline Phos 39 - 117 U/L 42  38  38   AST 0 - 37 U/L 17  18  16    ALT 0 - 35 U/L 16  17  18       Lab Results  Component Value Date   TSH 3.34 06/11/2023     Assessment: Encounter Diagnoses  Name Primary?   Dyspepsia Yes   Gastroesophageal reflux disease without esophagitis    Abdominal bloating      73 year old female patient with worsening GERD and abdominal bloating that radiated around her breast and into her back.  03/2024 abdominal ultrasound showed gallbladder stones and sludge.  EGD 03/2024 with negative biopsy was unremarkable.  Patient referred to [facility]  for gallstones and symptoms consistent with cholelithiasis and biliary colic.  Patient scheduled for laparoscopic cholecystectomy in October.  Patient did had some questions today about potentially reducing her reflux medications  following surgery, we can reevaluate after procedure is complete.      Patient also has chronic constipation and has responded well to over-the-counter Benefiber and MiraLAX.  Patient had concerns with daily use of MiraLAX.  I told her she could titrate the medication as needed and that every other day would be sufficient however she should avoid skipping several days in a row to prevent overflow. Reassured her daily use would be safe to do.   Plan: -Continue Pantoprazole  40 mg twice daily - continue Pepcid  prn - continue OTC reflux gourmet  -Continue Bene fiber po daily - Continue Miralax po daily, titrate as needed -Follow-up with Dr. San in November   Thank you for the courtesy of this consult. Please call me with any questions or concerns.   Mattye Verdone, FNP-C [facility] 05/09/2024, 10:29 AM  Cc: Glendia Shad, MD

## 2024-05-11 ENCOUNTER — Encounter: Payer: Self-pay | Admitting: Internal Medicine

## 2024-05-13 MED ORDER — PREGABALIN 75 MG PO CAPS: 75.0000 mg | ORAL_CAPSULE | Freq: Two times a day (BID) | ORAL | 2 refills | Status: DC

## 2024-05-13 NOTE — Telephone Encounter (Signed)
 Last OV 02/2024 Next OV 06/06/24 Last refill 02/17/24  Pharmacy: [facility]

## 2024-05-13 NOTE — Telephone Encounter (Signed)
 Rx ok;d for lyrica

## 2024-05-14 ENCOUNTER — Ambulatory Visit: Attending: Nurse Practitioner

## 2024-05-14 DIAGNOSIS — I340 Nonrheumatic mitral (valve) insufficiency: Secondary | ICD-10-CM

## 2024-05-14 DIAGNOSIS — I2510 Atherosclerotic heart disease of native coronary artery without angina pectoris: Secondary | ICD-10-CM | POA: Diagnosis not present

## 2024-05-14 LAB — ECHOCARDIOGRAM COMPLETE
AR max vel: 1.85 cm2
AV Area VTI: 1.62 cm2
AV Area mean vel: 1.66 cm2
AV Mean grad: 7 mmHg
AV Peak grad: 13.4 mmHg
Ao pk vel: 1.83 m/s
Area-P 1/2: 2.68 cm2
MV M vel: 5.35 m/s
MV Peak grad: 114.5 mmHg
MV VTI: 1.95 cm2
Radius: 0.7 cm
S' Lateral: 3.5 cm

## 2024-05-16 ENCOUNTER — Encounter: Payer: Self-pay | Admitting: Nurse Practitioner

## 2024-05-23 MED ORDER — SPIRONOLACTONE 25 MG PO TABS: 12.5000 mg | ORAL_TABLET | Freq: Every day | ORAL | Status: DC

## 2024-05-26 ENCOUNTER — Other Ambulatory Visit: Payer: Self-pay | Admitting: Internal Medicine

## 2024-05-26 DIAGNOSIS — Z1231 Encounter for screening mammogram for malignant neoplasm of breast: Secondary | ICD-10-CM

## 2024-05-30 ENCOUNTER — Other Ambulatory Visit (INDEPENDENT_AMBULATORY_CARE_PROVIDER_SITE_OTHER)

## 2024-05-30 ENCOUNTER — Ambulatory Visit: Payer: Self-pay | Admitting: Internal Medicine

## 2024-05-30 DIAGNOSIS — E7800 Pure hypercholesterolemia, unspecified: Secondary | ICD-10-CM

## 2024-05-30 DIAGNOSIS — I10 Essential (primary) hypertension: Secondary | ICD-10-CM

## 2024-05-30 LAB — BASIC METABOLIC PANEL WITH GFR
BUN: 17 mg/dL (ref 6–23)
CO2: 31 meq/L (ref 19–32)
Calcium: 10.6 mg/dL — ABNORMAL HIGH (ref 8.4–10.5)
Chloride: 103 meq/L (ref 96–112)
Creatinine, Ser: 0.95 mg/dL (ref 0.40–1.20)
GFR: 59.64 mL/min — ABNORMAL LOW (ref >60.00)
Glucose, Bld: 83 mg/dL (ref 70–99)
Potassium: 4.2 meq/L (ref 3.5–5.1)
Sodium: 140 meq/L (ref 135–145)

## 2024-05-30 LAB — HEPATIC FUNCTION PANEL
ALT: 14 U/L (ref 0–35)
AST: 16 U/L (ref 0–37)
Albumin: 4.4 g/dL (ref 3.5–5.2)
Alkaline Phosphatase: 41 U/L (ref 39–117)
Bilirubin, Direct: 0.1 mg/dL (ref 0.0–0.3)
Total Bilirubin: 0.6 mg/dL (ref 0.2–1.2)
Total Protein: 6.3 g/dL (ref 6.0–8.3)

## 2024-05-30 LAB — LIPID PANEL
Cholesterol: 114 mg/dL (ref 0–200)
HDL: 35 mg/dL — ABNORMAL LOW (ref >39.00)
LDL Cholesterol: 59 mg/dL (ref 0–99)
NonHDL: 79
Total CHOL/HDL Ratio: 3
Triglycerides: 98 mg/dL (ref 0.0–149.0)
VLDL: 19.6 mg/dL (ref 0.0–40.0)

## 2024-06-02 NOTE — Patient Instructions (Signed)
 SURGICAL WAITING ROOM VISITATION  Patients having surgery or a procedure may have no more than 2 support people in the waiting area - these visitors may rotate.    Children under the age of 42 must have an adult with them who is not the patient.  Visitors with respiratory illnesses are discouraged from visiting and should remain at home.  If the patient needs to stay at the hospital during part of their recovery, the visitor guidelines for inpatient rooms apply. Pre-op nurse will coordinate an appropriate time for 1 support person to accompany patient in pre-op.  This support person may not rotate.    Please refer to the [facility] website for the visitor guidelines for Inpatients (after your surgery is over and you are in a regular room).       Your procedure is scheduled on: 06/12/24   Report to [facility] Main Entrance    Report to admitting at 5:15 AM   Call this number if you have problems the morning of surgery [phone]   Do not eat food :After Midnight.   After Midnight you may have the following liquids until 4:30 AM DAY OF SURGERY  Water  Non-Citrus Juices (without pulp, NO RED-Apple, White grape, White cranberry) Black Coffee (NO MILK/CREAM OR CREAMERS, sugar ok)  Clear Tea (NO MILK/CREAM OR CREAMERS, sugar ok) regular and decaf                             Plain Jell-O (NO RED)                                           Fruit ices (not with fruit pulp, NO RED)                                     Popsicles (NO RED)                                                               Sports drinks like Gatorade (NO RED)                Oral Hygiene is also important to reduce your risk of infection.                                    Remember - BRUSH YOUR TEETH THE MORNING OF SURGERY WITH YOUR REGULAR TOOTHPASTE  DENTURES WILL BE REMOVED PRIOR TO SURGERY PLEASE DO NOT APPLY Poly grip OR ADHESIVES!!!   Stop all vitamins and herbal supplements 7 days before  surgery.   Take these medicines the morning of surgery with A SIP OF WATER : amlodipine , carvedilol , pantoprazole , pregabalin , tylenol  if needed.              Do not take hydrochlorothiazide (microzide ) the morning of surgery.             You may not have any metal on your body including hair pins, jewelry, and body piercing  Do not wear make-up, lotions, powders, perfumes/cologne, or deodorant  Do not wear nail polish including gel and S&S, artificial/acrylic nails, or any other type of covering on natural nails including finger and toenails. If you have artificial nails, gel coating, etc. that needs to be removed by a nail salon please have this removed prior to surgery or surgery may need to be canceled/ delayed if the surgeon/ anesthesia feels like they are unable to be safely monitored.   Do not shave  48 hours prior to surgery.    Do not bring valuables to the hospital. [facility] IS NOT             RESPONSIBLE   FOR VALUABLES.   Contacts, glasses, dentures or bridgework may not be worn into surgery.  DO NOT BRING YOUR HOME MEDICATIONS TO THE HOSPITAL. PHARMACY WILL DISPENSE MEDICATIONS LISTED ON YOUR MEDICATION LIST TO YOU DURING YOUR ADMISSION IN THE HOSPITAL!    Patients discharged on the day of surgery will not be allowed to drive home.  Someone NEEDS to stay with you for the first 24 hours after anesthesia.   Special Instructions: Bring a copy of your healthcare power of attorney and living will documents the day of surgery if you haven't scanned them before.              Please read over the following fact sheets you were given: IF YOU HAVE QUESTIONS ABOUT YOUR PRE-OP INSTRUCTIONS PLEASE CALL [phone] [facility]   If you received a COVID test during your pre-op visit  it is requested that you wear a mask when out in public, stay away from anyone that may not be feeling well and notify your surgeon if you develop symptoms. If you test positive for Covid or have been in  contact with anyone that has tested positive in the last 10 days please notify you surgeon.    [facility] - Preparing for Surgery Before surgery, you can play an important role.  Because skin is not sterile, your skin needs to be as free of germs as possible.  You can reduce the number of germs on your skin by washing with CHG (chlorahexidine gluconate) soap before surgery.  CHG is an antiseptic cleaner which kills germs and bonds with the skin to continue killing germs even after washing. Please DO NOT use if you have an allergy to CHG or antibacterial soaps.  If your skin becomes reddened/irritated stop using the CHG and inform your nurse when you arrive at Short Stay. Do not shave (including legs and underarms) for at least 48 hours prior to the first CHG shower.  You may shave your face/neck.  Please follow these instructions carefully:  1.  Shower with CHG Soap the night before surgery and the  morning of surgery.  2.  If you choose to wash your hair, wash your hair first as usual with your normal  shampoo.  3.  After you shampoo, rinse your hair and body thoroughly to remove the shampoo.                             4.  Use CHG as you would any other liquid soap.  You can apply chg directly to the skin and wash.  Gently with a scrungie or clean washcloth.  5.  Apply the CHG Soap to your body ONLY FROM THE NECK DOWN.   Do   not use on face/ open  Wound or open sores. Avoid contact with eyes, ears mouth and   genitals (private parts).                       Wash face,  Genitals (private parts) with your normal soap.             6.  Wash thoroughly, paying special attention to the area where your    surgery  will be performed.  7.  Thoroughly rinse your body with warm water  from the neck down.  8.  DO NOT shower/wash with your normal soap after using and rinsing off the CHG Soap.                9.  Pat yourself dry with a clean towel.            10.  Wear clean  pajamas.            11.  Place clean sheets on your bed the night of your first shower and do not  sleep with pets. Day of Surgery : Do not apply any lotions/deodorants the morning of surgery.  Please wear clean clothes to the hospital/surgery center.  FAILURE TO FOLLOW THESE INSTRUCTIONS MAY RESULT IN THE CANCELLATION OF YOUR SURGERY  PATIENT SIGNATURE_________________________________  NURSE SIGNATURE__________________________________  ________________________________________________________________________

## 2024-06-02 NOTE — Progress Notes (Addendum)
 COVID Vaccine received:  []  No [x]  Yes Date of any COVID positive Test in last 90 days: no PCP - Allena Hamilton MD Cardiologist - Dr. Evalene Lunger  Chest x-ray -  EKG -  04/22/24 Epic Stress Test -  ECHO - 05/14/24 Epic Cardiac Cath -   Cardiac clearance 04/22/24- Medford Meager NP  Bowel Prep - [x]  No  []   Yes ______  Pacemaker / ICD device [x]  No []  Yes   Spinal Cord Stimulator:[x]  No []  Yes       History of Sleep Apnea? [x]  No []  Yes   CPAP used?- [x]  No []  Yes    Does the patient monitor blood sugar?          [x]  No []  Yes  []  N/A  Patient has: [x]  NO Hx DM   []  Pre-DM                 []  DM1  []   DM2 Does patient have a [employer] or [employer]acom? []  No []  Yes   Fasting Blood Sugar Ranges-  Checks Blood Sugar _____ times a day  GLP1 agonist / usual dose - no GLP1 instructions:  SGLT-2 inhibitors / usual dose - no SGLT-2 instructions:   Blood Thinner / Instructions:no Aspirin Instructions: 81 mg ASA last dose 06/03/24 Comments:   Activity level: Patient is able to climb a flight of stairs without difficulty; [x]  No CP  [x]  No SOB,  Patient can perform ADLs without assistance.   Anesthesia review: CAd-CABG 2015, HTN, Mitral valve regurg., CHF  Patient denies shortness of breath, fever, cough and chest pain at PAT appointment.  Patient verbalized understanding and agreement to the Pre-Surgical Instructions that were given to them at this PAT appointment. Patient was also educated of the need to review these PAT instructions again prior to his/her surgery.I reviewed the appropriate phone numbers to call if they have any and questions or concerns.

## 2024-06-03 ENCOUNTER — Encounter (HOSPITAL_COMMUNITY): Payer: Self-pay

## 2024-06-03 ENCOUNTER — Encounter (HOSPITAL_COMMUNITY)
Admission: RE | Admit: 2024-06-03 | Discharge: 2024-06-03 | Disposition: A | Discharge status: Home / Self Care | Source: Ambulatory Visit | Attending: General Surgery | Admitting: General Surgery

## 2024-06-03 ENCOUNTER — Other Ambulatory Visit: Payer: Self-pay

## 2024-06-03 DIAGNOSIS — I10 Essential (primary) hypertension: Secondary | ICD-10-CM | POA: Diagnosis not present

## 2024-06-03 DIAGNOSIS — K8020 Calculus of gallbladder without cholecystitis without obstruction: Secondary | ICD-10-CM | POA: Insufficient documentation

## 2024-06-03 DIAGNOSIS — K219 Gastro-esophageal reflux disease without esophagitis: Secondary | ICD-10-CM | POA: Diagnosis not present

## 2024-06-03 DIAGNOSIS — Z01812 Encounter for preprocedural laboratory examination: Secondary | ICD-10-CM | POA: Diagnosis not present

## 2024-06-03 DIAGNOSIS — I4891 Unspecified atrial fibrillation: Secondary | ICD-10-CM | POA: Insufficient documentation

## 2024-06-03 DIAGNOSIS — Z951 Presence of aortocoronary bypass graft: Secondary | ICD-10-CM | POA: Insufficient documentation

## 2024-06-03 DIAGNOSIS — I081 Rheumatic disorders of both mitral and tricuspid valves: Secondary | ICD-10-CM | POA: Insufficient documentation

## 2024-06-03 DIAGNOSIS — I2510 Atherosclerotic heart disease of native coronary artery without angina pectoris: Secondary | ICD-10-CM | POA: Insufficient documentation

## 2024-06-03 HISTORY — DX: Malignant (primary) neoplasm, unspecified: C80.1

## 2024-06-03 LAB — COMPREHENSIVE METABOLIC PANEL WITH GFR
ALT: 17 U/L (ref 0–44)
AST: 21 U/L (ref 15–41)
Albumin: 4.4 g/dL (ref 3.5–5.0)
Alkaline Phosphatase: 49 U/L (ref 38–126)
Anion gap: 11 (ref 5–15)
BUN: 19 mg/dL (ref 8–23)
CO2: 26 mmol/L (ref 22–32)
Calcium: 10.4 mg/dL — ABNORMAL HIGH (ref 8.9–10.3)
Chloride: 102 mmol/L (ref 98–111)
Creatinine, Ser: 1 mg/dL (ref 0.44–1.00)
GFR, Estimated: 59 mL/min — ABNORMAL LOW (ref >60)
Glucose, Bld: 102 mg/dL — ABNORMAL HIGH (ref 70–99)
Potassium: 4.2 mmol/L (ref 3.5–5.1)
Sodium: 138 mmol/L (ref 135–145)
Total Bilirubin: 0.5 mg/dL (ref 0.0–1.2)
Total Protein: 6.6 g/dL (ref 6.5–8.1)

## 2024-06-03 LAB — CBC WITH DIFFERENTIAL/PLATELET
Abs Immature Granulocytes: 0.02 K/uL (ref 0.00–0.07)
Basophils Absolute: 0.1 K/uL (ref 0.0–0.1)
Basophils Relative: 1 %
Eosinophils Absolute: 0.2 K/uL (ref 0.0–0.5)
Eosinophils Relative: 2 %
HCT: 38.8 % (ref 36.0–46.0)
Hemoglobin: 12.4 g/dL (ref 12.0–15.0)
Immature Granulocytes: 0 %
Lymphocytes Relative: 31 %
Lymphs Abs: 2.1 K/uL (ref 0.7–4.0)
MCH: 30.9 pg (ref 26.0–34.0)
MCHC: 32 g/dL (ref 30.0–36.0)
MCV: 96.8 fL (ref 80.0–100.0)
Monocytes Absolute: 0.7 K/uL (ref 0.1–1.0)
Monocytes Relative: 10 %
Neutro Abs: 3.7 K/uL (ref 1.7–7.7)
Neutrophils Relative %: 56 %
Platelets: 183 K/uL (ref 150–400)
RBC: 4.01 MIL/uL (ref 3.87–5.11)
RDW: 12.8 % (ref 11.5–15.5)
WBC: 6.7 K/uL (ref 4.0–10.5)
nRBC: 0 % (ref 0.0–0.2)

## 2024-06-04 ENCOUNTER — Other Ambulatory Visit: Payer: Self-pay | Admitting: Internal Medicine

## 2024-06-04 ENCOUNTER — Encounter (HOSPITAL_COMMUNITY): Payer: Self-pay

## 2024-06-04 NOTE — Progress Notes (Addendum)
 Case: [id number] Date/Time: 06/12/24 0715   Procedure: LAPAROSCOPIC CHOLECYSTECTOMY   Anesthesia type: General   Diagnosis: Symptomatic cholelithiasis [K80.20]   Pre-op diagnosis: SYMPTOMATIC CHOLELITHIASIS   Location: [facility] ROOM 01 / [facility]   Surgeons: Tanda Locus, MD       DISCUSSION: Alexandra Mendoza is a 72 yo female with PMH of HTN, CAD s/p CABG x 3 (2015), post op A.fib, moderate mitral regurgitation, PVD, GERD.  Patient follows with Cardiology for above hx. Last seen on 04/22/24. Stable at that visit. Echo done on 05/14/24 showed normal LVEF 55-60%, mild-mod MR. Pre op assessment/clearance done:   From a cardiac standpoint, she has done well since her bypass surgery in 2015.  She had a negative Myoview in 2024 for similar symptoms as reported above.  She is able to walk 30 minutes daily without experiencing chest pain or dyspnea.  She is capable of achieving 8 METS.  ECG is unremarkable.  RCRI calculates to 0.9% of major adverse cardiac event in the setting of noncardiac surgery.  She does not require any further ischemic evaluation prior to potential surgery.  She should continue aspirin and statin throughout perioperative period.  Seen by PCP on 9/26. BP was 110/68 and she was advised to stop her hydrochlorothiazide  and continue spironolactone . All other issues stable.  VS: BP (!) 130/56   Pulse (!) 58   Temp 36.6 C (Oral)   Resp 16   Ht 5' 9 (1.753 m)   Wt 72.1 kg   SpO2 100%   BMI 23.48 kg/m   PROVIDERS: Glendia Shad, MD   LABS: Labs reviewed: Acceptable for surgery. (all labs ordered are listed, but only abnormal results are displayed)  Labs Reviewed  COMPREHENSIVE METABOLIC PANEL WITH GFR - Abnormal; Notable for the following components:      Result Value   Glucose, Bld 102 (*)    Calcium 10.4 (*)    GFR, Estimated 59 (*)    All other components within normal limits  CBC WITH DIFFERENTIAL/PLATELET    EKG 04/22/24:  Normal sinus rhythm Septal infarct (cited  on or before 22-Apr-2024) Nonspecific T wave abnormality  Echo 05/14/24:  IMPRESSIONS    1. Left ventricular ejection fraction, by estimation, is 55 to 60%. Left ventricular ejection fraction by PLAX is 57 %. The left ventricle has normal function. The left ventricle has no regional wall motion abnormalities. Left ventricular diastolic parameters were normal.  2. Right ventricular systolic function is normal. The right ventricular size is normal.  3. The mitral valve is normal in structure. Mild to moderate mitral valve regurgitation.  4. Tricuspid valve regurgitation is mild to moderate.  5. The aortic valve is grossly normal. Aortic valve regurgitation is not visualized.  6. The inferior vena cava is dilated in size with <50% respiratory variability, suggesting right atrial pressure of 15 mmHg.  Stress test 10/26/22 ([facility]):  FINDINGS:  Regional wall motion:  reveals normal myocardial thickening and wall  motion.  The overall quality of the study is good.   Artifacts noted: no  Left ventricular cavity: normal.   Perfusion Analysis:  SPECT images demonstrate homogeneous tracer  distribution throughout the myocardium. Defect type:  Normal   Past Medical History:  Diagnosis Date   Acid reflux    Atrial fibrillation (post-op CABG 2015)    Cancer (HCC)    Colon polyp [K63.5]    Coronary artery disease    a. 2015 CABG x 3: LIMA->LAD, VG->RI->OM1; b. 10/2022 MV: EF 65%, no isch/infarct.  Heart murmur    History of chicken pox    Hyperlipidemia    Hypertension    Insomnia    Moderate mitral regurgitation    a. 10/2022 Echo: EF >55%, mod MR.    Past Surgical History:  Procedure Laterality Date   APPENDECTOMY     BREAST EXCISIONAL BIOPSY Right    negative over 5 years ago- neg   BREAST SURGERY     Biopsy   COLONOSCOPY WITH PROPOFOL  N/A 05/16/2016   Procedure: COLONOSCOPY WITH PROPOFOL ;  Surgeon: Rogelia Copping, MD;  Location: [facility];  Service: Endoscopy;   Laterality: N/A;   CORONARY ARTERY BYPASS GRAFT  05/01/2014   3 vessel, [facility]   ESOPHAGOGASTRODUODENOSCOPY (EGD) WITH PROPOFOL  N/A 02/23/2017   Procedure: ESOPHAGOGASTRODUODENOSCOPY (EGD) WITH PROPOFOL ;  Surgeon: Copping Rogelia, MD;  Location: [facility];  Service: Endoscopy;  Laterality: N/A;   HYSTEROSCOPY WITH D & C     lipoma removed     removed from forehead   skin lesions removed  01/2023   triple bypass      MEDICATIONS:  acetaminophen  (TYLENOL ) 500 MG tablet   ALPHA-LIPOIC ACID PO   amLODipine  (NORVASC ) 10 MG tablet   ascorbic acid (VITAMIN C) 1000 MG tablet   aspirin EC 81 MG tablet   atorvastatin (LIPITOR) 40 MG tablet   Bacillus Coagulans-Guar Gum (BENEFIBER ADVANCED PO)   carvedilol  (COREG ) 6.25 MG tablet   Cholecalciferol (VITAMIN D3) 1000 units CAPS   cyanocobalamin  (VITAMIN B12) 1000 MCG tablet   docusate sodium (COLACE) 100 MG capsule   Famotidine  (PEPCID  AC PO)   hydrochlorothiazide  (MICROZIDE ) 12.5 MG capsule   irbesartan  (AVAPRO ) 300 MG tablet   Multiple Minerals-Vitamins (CAL MAG ZINC +D3 PO)   Omega-3 Fatty Acids (FISH OIL PO)   OVER THE COUNTER MEDICATION   pantoprazole  (PROTONIX ) 40 MG tablet   polyethylene glycol (MIRALAX / GLYCOLAX) 17 g packet   pregabalin  (LYRICA ) 75 MG capsule   Probiotic Product (PROBIOTIC PO)   spironolactone  (ALDACTONE ) 25 MG tablet   traZODone  (DESYREL ) 50 MG tablet   No current facility-administered medications for this encounter.    Burnard CHRISTELLA Odis DEVONNA [facility] Short Stay/Anesthesiology [facility] Phone [phone] 06/04/2024 11:10 AM

## 2024-06-06 ENCOUNTER — Encounter: Payer: Self-pay | Admitting: Internal Medicine

## 2024-06-06 ENCOUNTER — Ambulatory Visit (INDEPENDENT_AMBULATORY_CARE_PROVIDER_SITE_OTHER): Admitting: Internal Medicine

## 2024-06-06 VITALS — BP 110/68 | HR 64 | Temp 97.9°F | Ht 69.0 in | Wt 160.0 lb

## 2024-06-06 DIAGNOSIS — I10 Essential (primary) hypertension: Secondary | ICD-10-CM

## 2024-06-06 DIAGNOSIS — E7800 Pure hypercholesterolemia, unspecified: Secondary | ICD-10-CM

## 2024-06-06 DIAGNOSIS — I2510 Atherosclerotic heart disease of native coronary artery without angina pectoris: Secondary | ICD-10-CM | POA: Diagnosis not present

## 2024-06-06 DIAGNOSIS — I700 Atherosclerosis of aorta: Secondary | ICD-10-CM | POA: Diagnosis not present

## 2024-06-06 DIAGNOSIS — F419 Anxiety disorder, unspecified: Secondary | ICD-10-CM

## 2024-06-06 MED ORDER — TRAZODONE HCL 50 MG PO TABS: 50.0000 mg | ORAL_TABLET | Freq: Every evening | ORAL | 1 refills | Status: AC | PRN

## 2024-06-06 MED ORDER — CARVEDILOL 6.25 MG PO TABS: 6.2500 mg | ORAL_TABLET | Freq: Two times a day (BID) | ORAL | 4 refills | Status: AC

## 2024-06-06 MED ORDER — AMLODIPINE BESYLATE 10 MG PO TABS: 10.0000 mg | ORAL_TABLET | Freq: Every day | ORAL | 3 refills | Status: AC

## 2024-06-06 NOTE — Progress Notes (Unsigned)
 Subjective:    [name], female    DOB: October 16, 1950, 73 y.o.   MRN: [id number]  Patient here for  Chief Complaint  Patient presents with   Medical Management of Chronic Issues    HPI Here for a scheduled follow up - follow up regarding CAD, GERD, hypercholesterolemia, neuropathy and hypertension. Saw GI - recommended to continue protonix  40mg  bid. Schedule EGD. EGD 03/2024 multipel gastric polyps. Abdominal ultrasound - gall stones and sludge. Saw cardiology 04/22/24 - stable. Continue aspirin, statin, ARB and calcium channel blocker. Off spironolactone  and hydrochlorothiazide  due to lightheadedness and pressure 90s. Saw surgery 04/25/24 - recommended lap cholecystectomy. Follow up with GI 05/09/24 - recommended to continue protonix  40mg  bid. Continue benefiber and miralax. Per review, started noticing some mild fluid retention and restarted spironolactone . Is now back on hydrochlorothiazide  and spironolactone . Noticing some lower pressures. No chest pain. Breathing stable.    Past Medical History:  Diagnosis Date   Acid reflux    Atrial fibrillation (post-op CABG 2015)    Cancer (HCC)    Colon polyp [K63.5]    Coronary artery disease    a. 2015 CABG x 3: LIMA->LAD, VG->RI->OM1; b. 10/2022 MV: EF 65%, no isch/infarct.   Heart murmur    History of chicken pox    Hyperlipidemia    Hypertension    Insomnia    Moderate mitral regurgitation    a. 10/2022 Echo: EF >55%, mod MR.   Past Surgical History:  Procedure Laterality Date   APPENDECTOMY     BREAST EXCISIONAL BIOPSY Right    negative over 5 years ago- neg   BREAST SURGERY     Biopsy   COLONOSCOPY WITH PROPOFOL  N/A 05/16/2016   Procedure: COLONOSCOPY WITH PROPOFOL ;  Surgeon: Rogelia Copping, MD;  Location: [facility];  Service: Endoscopy;  Laterality: N/A;   CORONARY ARTERY BYPASS GRAFT  05/01/2014   3 vessel, [facility]   ESOPHAGOGASTRODUODENOSCOPY (EGD) WITH PROPOFOL  N/A 02/23/2017   Procedure:  ESOPHAGOGASTRODUODENOSCOPY (EGD) WITH PROPOFOL ;  Surgeon: Copping Rogelia, MD;  Location: [facility];  Service: Endoscopy;  Laterality: N/A;   HYSTEROSCOPY WITH D & C     lipoma removed     removed from forehead   skin lesions removed  01/2023   triple bypass     Family History  Problem Relation Age of Onset   Heart disease Mother    Stroke Mother    Alzheimer's disease Mother    Heart disease Father    Stroke Father    Breast cancer Sister 75   Alzheimer's disease Maternal Grandmother    Alzheimer's disease Maternal Aunt    Alzheimer's disease Maternal Uncle    Colon cancer Neg Hx    Liver disease Neg Hx    Esophageal cancer Neg Hx    Social History   Socioeconomic History   Marital status: Married    Spouse name: Not on file   Number of children: Not on file   Years of education: Not on file   Highest education level: Not on file  Occupational History   Not on file  Tobacco Use   Smoking status: Never   Smokeless tobacco: Never  Vaping Use   Vaping status: Never Used  Substance and Sexual Activity   Alcohol use: Never    Alcohol/week: 0.0 standard drinks of alcohol   Drug use: No   Sexual activity: Yes    Birth control/protection: Post-menopausal  Other Topics Concern   Not  on file  Social History Narrative   Married   Social Drivers of Health   Financial Resource Strain: Low Risk  (10/08/2023)   Overall Financial Resource Strain (CARDIA)    Difficulty of Paying Living Expenses: Not hard at all  Food Insecurity: No Food Insecurity (10/08/2023)   Hunger Vital Sign    Worried About Running Out of Food in the Last Year: Never true    Ran Out of Food in the Last Year: Never true  Transportation Needs: No Transportation Needs (10/08/2023)   PRAPARE - Administrator, Civil Service (Medical): No    Lack of Transportation (Non-Medical): No  Physical Activity: Insufficiently Active (10/08/2023)   Exercise Vital Sign    Days of Exercise per Week: 3  days    Minutes of Exercise per Session: 40 min  Stress: No Stress Concern Present (10/08/2023)   [employer] of Occupational Health - Occupational Stress Questionnaire    Feeling of Stress : Not at all  Social Connections: Moderately Integrated (10/08/2023)   Social Connection and Isolation Panel    Frequency of Communication with Friends and Family: More than three times a week    Frequency of Social Gatherings with Friends and Family: More than three times a week    Attends Religious Services: More than 4 times per year    Active Member of [employer] or Organizations: No    Attends Banker Meetings: Never    Marital Status: Married     Review of Systems  Constitutional:  Negative for appetite change and unexpected weight change.  HENT:  Negative for congestion and sinus pressure.   Respiratory:  Negative for cough, chest tightness and shortness of breath.   Cardiovascular:  Negative for chest pain, palpitations and leg swelling.  Gastrointestinal:  Negative for abdominal pain, diarrhea, nausea and vomiting.  Genitourinary:  Negative for difficulty urinating and dysuria.  Musculoskeletal:  Negative for joint swelling and myalgias.  Skin:  Negative for color change and rash.  Neurological:  Negative for dizziness and headaches.  Psychiatric/Behavioral:  Negative for agitation and dysphoric mood.        Objective:     BP 110/68   Pulse 64   Temp 97.9 F (36.6 C) (Oral)   Ht 5' 9 (1.753 m)   Wt 160 lb (72.6 kg)   SpO2 99%   BMI 23.63 kg/m  Wt Readings from Last 3 Encounters:  06/06/24 160 lb (72.6 kg)  06/03/24 159 lb (72.1 kg)  05/09/24 163 lb 8 oz (74.2 kg)    Physical Exam Vitals reviewed.  Constitutional:      General: She is not in acute distress.    Appearance: Normal appearance.  HENT:     Head: Normocephalic and atraumatic.     Right Ear: External ear normal.     Left Ear: External ear normal.     Mouth/Throat:     Pharynx: No  oropharyngeal exudate or posterior oropharyngeal erythema.  Eyes:     General: No scleral icterus.       Right eye: No discharge.        Left eye: No discharge.     Conjunctiva/sclera: Conjunctivae normal.  Neck:     Thyroid : No thyromegaly.  Cardiovascular:     Rate and Rhythm: Normal rate and regular rhythm.  Pulmonary:     Effort: No respiratory distress.     Breath sounds: Normal breath sounds. No wheezing.  Abdominal:     General: Bowel  sounds are normal.     Palpations: Abdomen is soft.     Tenderness: There is no abdominal tenderness.  Musculoskeletal:        General: No swelling or tenderness.     Cervical back: Neck supple. No tenderness.  Lymphadenopathy:     Cervical: No cervical adenopathy.  Skin:    Findings: No erythema or rash.  Neurological:     Mental Status: She is alert.  Psychiatric:        Mood and Affect: Mood normal.        Behavior: Behavior normal.     {Perform Simple Foot Exam  Perform Detailed exam:1} {Insert foot Exam (Optional):30965}   Outpatient Encounter Medications as of 06/06/2024  Medication Sig   acetaminophen  (TYLENOL ) 500 MG tablet Take 500-1,000 mg by mouth every 6 (six) hours as needed (pain.).   ALPHA-LIPOIC ACID PO Take 600 mg by mouth in the morning.   amLODipine  (NORVASC ) 10 MG tablet TAKE 1 TABLET BY MOUTH EVERYDAY AT BEDTIME   ascorbic acid (VITAMIN C) 1000 MG tablet Take 1 tablet by mouth in the morning.   aspirin EC 81 MG tablet Take 81 mg by mouth in the morning.   atorvastatin (LIPITOR) 40 MG tablet TAKE 1 TABLET BY MOUTH EVERY DAY   Bacillus Coagulans-Guar Gum (BENEFIBER ADVANCED PO) Take 1 Dose by mouth in the morning.   carvedilol  (COREG ) 6.25 MG tablet TAKE 1 TABLET BY MOUTH TWICE A DAY WITH FOOD   Cholecalciferol (VITAMIN D3) 1000 units CAPS Take 1,000 Units by mouth in the morning.   cyanocobalamin  (VITAMIN B12) 1000 MCG tablet Take 1,000 mcg by mouth daily.   docusate sodium (COLACE) 100 MG capsule Take 200 mg by  mouth in the morning.   Famotidine  (PEPCID  AC PO) Take 1-2 tablets by mouth daily as needed (indigestion/heartburn.).   hydrochlorothiazide  (MICROZIDE ) 12.5 MG capsule Take 12.5 mg by mouth in the morning.   irbesartan  (AVAPRO ) 300 MG tablet TAKE 1 TABLET BY MOUTH EVERY DAY (Patient taking differently: Take 300 mg by mouth every evening.)   Multiple Minerals-Vitamins (CAL MAG ZINC +D3 PO) Take 1 tablet by mouth in the morning.   Omega-3 Fatty Acids (FISH OIL PO) Take 1,200 mg by mouth in the morning.   OVER THE COUNTER MEDICATION Take 1 Dose by mouth daily as needed (indigestion/heartburn). Reflux Gourmet Paste use 1 teaspoonful   pantoprazole  (PROTONIX ) 40 MG tablet Take 40 mg by mouth 2 (two) times daily.   polyethylene glycol (MIRALAX / GLYCOLAX) 17 g packet Take 17 g by mouth in the morning.   pregabalin  (LYRICA ) 75 MG capsule Take 1 capsule (75 mg total) by mouth 2 (two) times daily.   Probiotic Product (PROBIOTIC PO) Take 1 capsule by mouth in the morning.   spironolactone  (ALDACTONE ) 25 MG tablet Take 0.5 tablets (12.5 mg total) by mouth daily.   traZODone  (DESYREL ) 50 MG tablet TAKE 1-2 TABLETS (50-100 MG TOTAL) BY MOUTH AT BEDTIME AS NEEDED. (Patient taking differently: Take 75 mg by mouth at bedtime.)   [DISCONTINUED] atorvastatin (LIPITOR) 40 MG tablet TAKE 1 TABLET BY MOUTH EVERY DAY (Patient taking differently: Take 40 mg by mouth every evening.)   No facility-administered encounter medications on file as of 06/06/2024.     Lab Results  Component Value Date   WBC 6.7 06/03/2024   HGB 12.4 06/03/2024   HCT 38.8 06/03/2024   PLT 183 06/03/2024   GLUCOSE 102 (H) 06/03/2024   CHOL 114 05/30/2024   TRIG  98.0 05/30/2024   HDL 35.00 (L) 05/30/2024   LDLCALC 59 05/30/2024   ALT 17 06/03/2024   AST 21 06/03/2024   NA 138 06/03/2024   K 4.2 06/03/2024   CL 102 06/03/2024   CREATININE 1.00 06/03/2024   BUN 19 06/03/2024   CO2 26 06/03/2024   TSH 3.34 06/11/2023    No results  found.     Assessment & Plan:  Essential (primary) hypertension     Allena Hamilton, MD

## 2024-06-06 NOTE — Patient Instructions (Signed)
Stop hctz

## 2024-06-09 ENCOUNTER — Encounter: Payer: Self-pay | Admitting: Internal Medicine

## 2024-06-10 ENCOUNTER — Encounter: Payer: Self-pay | Admitting: Internal Medicine

## 2024-06-10 NOTE — Telephone Encounter (Signed)
 Called patient. She says this is not something acute going on. The bilateral ankle/feet swelling is the only symptom. This message was an update since stopping the hydrochlorothiazide  and continuing the spironolactone  12.5. Patient is wondering if she should increase to the dose to 25 mg or wait and see how she does since it has only been a few days off the hydrochlorothiazide . She says this happened to her in the past when she tried to come off hydrochlorothiazide . Patient is aware that you are out of office.

## 2024-06-11 NOTE — Anesthesia Preprocedure Evaluation (Addendum)
 Anesthesia Evaluation  Patient identified by MRN, date of birth, ID band Patient awake    Reviewed: Allergy & Precautions, NPO status , Patient's Chart, lab work & pertinent test results  Airway Mallampati: II  TM Distance: >3 FB Neck ROM: Full    Dental no notable dental hx.    Pulmonary neg pulmonary ROS   Pulmonary exam normal        Cardiovascular hypertension, Pt. on medications + CAD, + CABG (2015) and +CHF  + dysrhythmias Atrial Fibrillation + Valvular Problems/Murmurs MR  Rhythm:Regular Rate:Normal  Echo 05/14/24:  IMPRESSIONS    1. Left ventricular ejection fraction, by estimation, is 55 to 60%. Left ventricular ejection fraction by PLAX is 57 %. The left ventricle has normal function. The left ventricle has no regional wall motion abnormalities. Left ventricular diastolic parameters were normal.  2. Right ventricular systolic function is normal. The right ventricular size is normal.  3. The mitral valve is normal in structure. Mild to moderate mitral valve regurgitation.  4. Tricuspid valve regurgitation is mild to moderate.  5. The aortic valve is grossly normal. Aortic valve regurgitation is not visualized.  6. The inferior vena cava is dilated in size with <50% respiratory variability, suggesting right atrial pressure of 15 mmHg.    Neuro/Psych  Headaches  Anxiety        GI/Hepatic Neg liver ROS,GERD  Medicated,,Cholelithiasis    Endo/Other  negative endocrine ROS    Renal/GU negative Renal ROS  negative genitourinary   Musculoskeletal  (+) Arthritis , Osteoarthritis,    Abdominal Normal abdominal exam  (+)   Peds  Hematology   Anesthesia Other Findings   Reproductive/Obstetrics                              Anesthesia Physical Anesthesia Plan  ASA: 3  Anesthesia Plan:    Post-op Pain Management: Tylenol  PO (pre-op)* and Celebrex PO (pre-op)*   Induction:  Intravenous  PONV Risk Score and Plan: 2 and Ondansetron, Dexamethasone and Treatment may vary due to age or medical condition  Airway Management Planned: Mask and Oral ETT  Additional Equipment: None  Intra-op Plan:   Post-operative Plan: Extubation in OR  Informed Consent: I have reviewed the patients History and Physical, chart, labs and discussed the procedure including the risks, benefits and alternatives for the proposed anesthesia with the patient or authorized representative who has indicated his/her understanding and acceptance.     Dental advisory given  Plan Discussed with: CRNA  Anesthesia Plan Comments: (See PAT note from 9/23 )         Anesthesia Quick Evaluation

## 2024-06-11 NOTE — Telephone Encounter (Signed)
 Called and spoke to Alexandra Mendoza and contacted cardiology. She denies any chest pain or sob. No acute symptoms. Swelling is better in am and worse as day progresses. Discussed elevating legs and wearing compression hose and moving around. Will hold on making adjustments in medication prior to her surgery tomorrow. Blood pressure is doing well. Will monitor and call with update.

## 2024-06-11 NOTE — Assessment & Plan Note (Signed)
 Slight elevation recently. Is back on hydrochlorothiazide . Given her issues with lower blood pressure readings, will have her hold the low dose hydrochlorothiazide . Follow calcium level.

## 2024-06-11 NOTE — Assessment & Plan Note (Signed)
 Continues trazodone  at night.  Overall appears to be doing relatively well.  No changes in medication today. Follow.

## 2024-06-11 NOTE — Assessment & Plan Note (Signed)
 Continue lipitor  ?

## 2024-06-11 NOTE — Assessment & Plan Note (Signed)
 Saw Dr Florencio 10/23/22 - recommended myoview and echo.  Continue aspirin and atorvastatin.  Myoview overall showed good exercise tolerance walking 7 minutes under the Bruce protocol with no significant symptom development. No evidence of myocardial ischemia by Myoview. Echocardiogram had showed overall normal LV and RV systolic function, EF greater than 55% with moderate TR overall unchanged from 2022  saw Dr Perla 01/29/23 - stable.  No changes made. Had f/u with Medford Meager and discussed upcoming surgery. Reviewed his recommendations  and he notes that from a cardiac standpoint, she has done well since her bypass surgery in 2015. She had a negative Myoview in 2024. She is able to walk 30 minutes daily without experiencing chest pain or dyspnea. She is capable of achieving 8 METS. ECG is unremarkable. RCRI calculates to 0.9% of major adverse cardiac event in the setting of noncardiac surgery. Cardiology felt she did not require any further ischemic evaluation prior to potential surgery. She should continue aspirin and statin throughout perioperative period. She confirms today, no chest pain and no sob. No change in symptoms.

## 2024-06-11 NOTE — Assessment & Plan Note (Signed)
 Continue lipitor.  Low cholesterol diet and exercise.  Follow lipid panel. LDL recent check 05/30/24 - 59. No changes in medication.

## 2024-06-11 NOTE — Assessment & Plan Note (Signed)
 Currently on coreg , amlodipine , hctz, avapro  and aldactone .  Previous abdominal ultrasound - kidneys relatively equal size. Saw nephrology.  No changes made.  Pressure as outlined. Reviewed outside readings. Blood pressures varying. Has recently started back on spironolactone  and hydrochlorothiazide  - after being off for low pressures. Reports still having some lower readings. Discussed continuing spironolactone  and holding hydrochlorothiazide  for now. Follow pressures. Follow metabolic panel.

## 2024-06-12 ENCOUNTER — Ambulatory Visit (HOSPITAL_COMMUNITY)
Admission: RE | Admit: 2024-06-12 | Discharge: 2024-06-12 | Disposition: A | Discharge status: Home / Self Care | Source: Ambulatory Visit | Attending: General Surgery | Admitting: General Surgery

## 2024-06-12 ENCOUNTER — Other Ambulatory Visit: Payer: Self-pay

## 2024-06-12 ENCOUNTER — Ambulatory Visit (HOSPITAL_COMMUNITY): Payer: Self-pay | Admitting: Medical

## 2024-06-12 ENCOUNTER — Encounter (HOSPITAL_COMMUNITY)
Admission: RE | Disposition: A | Discharge status: Home / Self Care | Payer: Self-pay | Source: Ambulatory Visit | Attending: General Surgery

## 2024-06-12 ENCOUNTER — Ambulatory Visit (HOSPITAL_BASED_OUTPATIENT_CLINIC_OR_DEPARTMENT_OTHER): Payer: Self-pay | Admitting: Anesthesiology

## 2024-06-12 ENCOUNTER — Encounter (HOSPITAL_COMMUNITY): Payer: Self-pay | Admitting: General Surgery

## 2024-06-12 DIAGNOSIS — Z79899 Other long term (current) drug therapy: Secondary | ICD-10-CM | POA: Diagnosis not present

## 2024-06-12 DIAGNOSIS — I4891 Unspecified atrial fibrillation: Secondary | ICD-10-CM | POA: Diagnosis not present

## 2024-06-12 DIAGNOSIS — K8020 Calculus of gallbladder without cholecystitis without obstruction: Secondary | ICD-10-CM | POA: Diagnosis not present

## 2024-06-12 DIAGNOSIS — I10 Essential (primary) hypertension: Secondary | ICD-10-CM

## 2024-06-12 DIAGNOSIS — F419 Anxiety disorder, unspecified: Secondary | ICD-10-CM

## 2024-06-12 DIAGNOSIS — I110 Hypertensive heart disease with heart failure: Secondary | ICD-10-CM | POA: Diagnosis not present

## 2024-06-12 DIAGNOSIS — I2510 Atherosclerotic heart disease of native coronary artery without angina pectoris: Secondary | ICD-10-CM | POA: Diagnosis not present

## 2024-06-12 DIAGNOSIS — I509 Heart failure, unspecified: Secondary | ICD-10-CM | POA: Insufficient documentation

## 2024-06-12 DIAGNOSIS — Z951 Presence of aortocoronary bypass graft: Secondary | ICD-10-CM | POA: Insufficient documentation

## 2024-06-12 HISTORY — PX: CHOLECYSTECTOMY: SHX55

## 2024-06-12 SURGERY — LAPAROSCOPIC CHOLECYSTECTOMY
Anesthesia: General | Site: Abdomen

## 2024-06-12 MED ORDER — BUPIVACAINE-EPINEPHRINE (PF) 0.25% -1:200000 IJ SOLN
INTRAMUSCULAR | Status: AC
  Filled 2024-06-12: qty 30

## 2024-06-12 MED ORDER — ACETAMINOPHEN 500 MG PO TABS
1000.0000 mg | ORAL_TABLET | Freq: Once | ORAL | Status: AC
  Administered 2024-06-12: 1000 mg via ORAL
  Filled 2024-06-12: qty 2

## 2024-06-12 MED ORDER — CELECOXIB 200 MG PO CAPS
200.0000 mg | ORAL_CAPSULE | Freq: Once | ORAL | Status: AC
  Administered 2024-06-12: 200 mg via ORAL
  Filled 2024-06-12: qty 1

## 2024-06-12 MED ORDER — DROPERIDOL 2.5 MG/ML IJ SOLN: 0.6250 mg | Freq: Once | INTRAMUSCULAR | Status: DC | PRN

## 2024-06-12 MED ORDER — FENTANYL CITRATE PF 50 MCG/ML IJ SOSY: 25.0000 ug | PREFILLED_SYRINGE | INTRAMUSCULAR | Status: DC | PRN

## 2024-06-12 MED ORDER — SUGAMMADEX SODIUM 200 MG/2ML IV SOLN
INTRAVENOUS | Status: DC | PRN
  Administered 2024-06-12: 200 mg via INTRAVENOUS

## 2024-06-12 MED ORDER — BUPIVACAINE-EPINEPHRINE 0.25% -1:200000 IJ SOLN
INTRAMUSCULAR | Status: DC | PRN
  Administered 2024-06-12: 30 mL

## 2024-06-12 MED ORDER — SUGAMMADEX SODIUM 200 MG/2ML IV SOLN
INTRAVENOUS | Status: AC
  Filled 2024-06-12: qty 2

## 2024-06-12 MED ORDER — DEXAMETHASONE SODIUM PHOSPHATE 10 MG/ML IJ SOLN
INTRAMUSCULAR | Status: AC
  Filled 2024-06-12: qty 1

## 2024-06-12 MED ORDER — STERILE WATER FOR IRRIGATION IR SOLN
Status: DC | PRN
  Administered 2024-06-12: 1000 mL

## 2024-06-12 MED ORDER — CHLORHEXIDINE GLUCONATE CLOTH 2 % EX PADS: 6.0000 | MEDICATED_PAD | Freq: Once | CUTANEOUS | Status: DC

## 2024-06-12 MED ORDER — ORAL CARE MOUTH RINSE: 15.0000 mL | Freq: Once | OROMUCOSAL | Status: AC

## 2024-06-12 MED ORDER — TRAMADOL HCL 50 MG PO TABS: 50.0000 mg | ORAL_TABLET | Freq: Four times a day (QID) | ORAL | 0 refills | Status: DC | PRN

## 2024-06-12 MED ORDER — PHENYLEPHRINE HCL-NACL 20-0.9 MG/250ML-% IV SOLN
INTRAVENOUS | Status: DC | PRN
  Administered 2024-06-12: 15 ug/min via INTRAVENOUS

## 2024-06-12 MED ORDER — CHLORHEXIDINE GLUCONATE 0.12 % MT SOLN
15.0000 mL | Freq: Once | OROMUCOSAL | Status: AC
  Administered 2024-06-12: 15 mL via OROMUCOSAL

## 2024-06-12 MED ORDER — 0.9 % SODIUM CHLORIDE (POUR BTL) OPTIME
TOPICAL | Status: DC | PRN
  Administered 2024-06-12: 1000 mL

## 2024-06-12 MED ORDER — DEXAMETHASONE SODIUM PHOSPHATE 10 MG/ML IJ SOLN
INTRAMUSCULAR | Status: DC | PRN
Start: 2024-06-12 — End: 2024-06-12
  Administered 2024-06-12: 8 mg via INTRAVENOUS

## 2024-06-12 MED ORDER — ACETAMINOPHEN 500 MG PO TABS: 1000.0000 mg | ORAL_TABLET | Freq: Three times a day (TID) | ORAL | Status: AC

## 2024-06-12 MED ORDER — PHENYLEPHRINE 80 MCG/ML (10ML) SYRINGE FOR IV PUSH (FOR BLOOD PRESSURE SUPPORT)
PREFILLED_SYRINGE | INTRAVENOUS | Status: DC | PRN
  Administered 2024-06-12: 80 ug via INTRAVENOUS

## 2024-06-12 MED ORDER — FENTANYL CITRATE PF 50 MCG/ML IJ SOSY
PREFILLED_SYRINGE | INTRAMUSCULAR | Status: AC
  Filled 2024-06-12: qty 2

## 2024-06-12 MED ORDER — EPHEDRINE 5 MG/ML INJ
INTRAVENOUS | Status: AC
Start: 2024-06-12 — End: 2024-06-12
  Filled 2024-06-12: qty 5

## 2024-06-12 MED ORDER — ONDANSETRON HCL 4 MG/2ML IJ SOLN
INTRAMUSCULAR | Status: AC
  Filled 2024-06-12: qty 2

## 2024-06-12 MED ORDER — MIDAZOLAM HCL 2 MG/2ML IJ SOLN
INTRAMUSCULAR | Status: AC
  Filled 2024-06-12: qty 2

## 2024-06-12 MED ORDER — ROCURONIUM BROMIDE 10 MG/ML (PF) SYRINGE
PREFILLED_SYRINGE | INTRAVENOUS | Status: AC
Start: 2024-06-12 — End: 2024-06-12
  Filled 2024-06-12: qty 10

## 2024-06-12 MED ORDER — KETOROLAC TROMETHAMINE 30 MG/ML IJ SOLN
INTRAMUSCULAR | Status: DC | PRN
Start: 2024-06-12 — End: 2024-06-12
  Administered 2024-06-12: 30 mg via INTRAVENOUS

## 2024-06-12 MED ORDER — FENTANYL CITRATE (PF) 250 MCG/5ML IJ SOLN
INTRAMUSCULAR | Status: DC | PRN
  Administered 2024-06-12: 50 ug via INTRAVENOUS

## 2024-06-12 MED ORDER — ONDANSETRON HCL 4 MG/2ML IJ SOLN
INTRAMUSCULAR | Status: DC | PRN
  Administered 2024-06-12: 4 mg via INTRAVENOUS

## 2024-06-12 MED ORDER — PROPOFOL 10 MG/ML IV BOLUS
INTRAVENOUS | Status: AC
  Filled 2024-06-12: qty 20

## 2024-06-12 MED ORDER — PHENYLEPHRINE 80 MCG/ML (10ML) SYRINGE FOR IV PUSH (FOR BLOOD PRESSURE SUPPORT)
PREFILLED_SYRINGE | INTRAVENOUS | Status: AC
  Filled 2024-06-12: qty 10

## 2024-06-12 MED ORDER — EPHEDRINE 5 MG/ML INJ
INTRAVENOUS | Status: AC
  Filled 2024-06-12: qty 5

## 2024-06-12 MED ORDER — GLYCOPYRROLATE 0.2 MG/ML IJ SOLN
INTRAMUSCULAR | Status: AC
  Filled 2024-06-12: qty 1

## 2024-06-12 MED ORDER — LACTATED RINGERS IV SOLN
INTRAVENOUS | Status: AC | PRN
  Administered 2024-06-12: 1000 mL

## 2024-06-12 MED ORDER — LIDOCAINE HCL (PF) 2 % IJ SOLN
INTRAMUSCULAR | Status: DC | PRN
Start: 2024-06-12 — End: 2024-06-12
  Administered 2024-06-12: 60 mg via INTRADERMAL

## 2024-06-12 MED ORDER — SPY AGENT GREEN - (INDOCYANINE FOR INJECTION)
1.2500 mg | Freq: Once | INTRAMUSCULAR | Status: AC
  Administered 2024-06-12: 1.25 mg via INTRAVENOUS
  Filled 2024-06-12: qty 10

## 2024-06-12 MED ORDER — CEFAZOLIN SODIUM-DEXTROSE 2-4 GM/100ML-% IV SOLN
2.0000 g | INTRAVENOUS | Status: AC
  Administered 2024-06-12: 2 g via INTRAVENOUS
  Filled 2024-06-12: qty 100

## 2024-06-12 MED ORDER — LIDOCAINE HCL (PF) 2 % IJ SOLN
INTRAMUSCULAR | Status: AC
  Filled 2024-06-12: qty 5

## 2024-06-12 MED ORDER — EPHEDRINE SULFATE-NACL 50-0.9 MG/10ML-% IV SOSY
PREFILLED_SYRINGE | INTRAVENOUS | Status: DC | PRN
  Administered 2024-06-12: 10 mg via INTRAVENOUS
  Administered 2024-06-12: 15 mg via INTRAVENOUS
  Administered 2024-06-12: 10 mg via INTRAVENOUS
  Administered 2024-06-12: 5 mg via INTRAVENOUS
  Administered 2024-06-12: 10 mg via INTRAVENOUS

## 2024-06-12 MED ORDER — KETOROLAC TROMETHAMINE 30 MG/ML IJ SOLN
INTRAMUSCULAR | Status: AC
  Filled 2024-06-12: qty 1

## 2024-06-12 MED ORDER — ACETAMINOPHEN 500 MG PO TABS: 1000.0000 mg | ORAL_TABLET | ORAL | Status: DC

## 2024-06-12 MED ORDER — PROPOFOL 10 MG/ML IV BOLUS
INTRAVENOUS | Status: DC | PRN
  Administered 2024-06-12: 140 mg via INTRAVENOUS

## 2024-06-12 MED ORDER — LACTATED RINGERS IV SOLN: INTRAVENOUS | Status: DC

## 2024-06-12 MED ORDER — FENTANYL CITRATE (PF) 250 MCG/5ML IJ SOLN
INTRAMUSCULAR | Status: AC
  Filled 2024-06-12: qty 5

## 2024-06-12 MED ORDER — ROCURONIUM BROMIDE 10 MG/ML (PF) SYRINGE
PREFILLED_SYRINGE | INTRAVENOUS | Status: DC | PRN
  Administered 2024-06-12: 60 mg via INTRAVENOUS

## 2024-06-12 SURGICAL SUPPLY — 42 items
APPLICATOR ARISTA FLEXITIP XL (MISCELLANEOUS) IMPLANT
BAG COUNTER SPONGE SURGICOUNT (BAG) IMPLANT
CABLE HIGH FREQUENCY MONO STRZ (ELECTRODE) ×1 IMPLANT
CHLORAPREP W/TINT 26 (MISCELLANEOUS) ×1 IMPLANT
CLIP APPLIE 5 13 M/L LIGAMAX5 (MISCELLANEOUS) IMPLANT
CLIP APPLIE ROT 10 11.4 M/L (STAPLE) IMPLANT
CLIP LIGATING HEMO O LOK GREEN (MISCELLANEOUS) IMPLANT
COVER MAYO STAND XLG (MISCELLANEOUS) IMPLANT
COVER SURGICAL LIGHT HANDLE (MISCELLANEOUS) ×1 IMPLANT
DERMABOND ADVANCED .7 DNX12 (GAUZE/BANDAGES/DRESSINGS) IMPLANT
DRAPE C-ARM 42X120 X-RAY (DRAPES) IMPLANT
DRSG TEGADERM 2-3/8X2-3/4 SM (GAUZE/BANDAGES/DRESSINGS) ×3 IMPLANT
DRSG TEGADERM 4X4.75 (GAUZE/BANDAGES/DRESSINGS) ×1 IMPLANT
ELECT REM PT RETURN 15FT ADLT (MISCELLANEOUS) ×1 IMPLANT
GAUZE SPONGE 2X2 8PLY STRL LF (GAUZE/BANDAGES/DRESSINGS) ×1 IMPLANT
GLOVE BIO SURGEON STRL SZ7.5 (GLOVE) ×1 IMPLANT
GLOVE INDICATOR 8.0 STRL GRN (GLOVE) ×1 IMPLANT
GOWN STRL REUS W/ TWL XL LVL3 (GOWN DISPOSABLE) ×1 IMPLANT
GRASPER SUT TROCAR 14GX15 (MISCELLANEOUS) IMPLANT
HEMOSTAT ARISTA ABSORB 3G PWDR (HEMOSTASIS) IMPLANT
HEMOSTAT SNOW SURGICEL 2X4 (HEMOSTASIS) IMPLANT
IRRIGATION SUCT STRKRFLW 2 WTP (MISCELLANEOUS) ×1 IMPLANT
KIT BASIN OR (CUSTOM PROCEDURE TRAY) ×1 IMPLANT
KIT TURNOVER KIT A (KITS) ×1 IMPLANT
LHOOK LAP DISP 36CM (ELECTROSURGICAL) IMPLANT
POUCH RETRIEVAL ECOSAC 10 (ENDOMECHANICALS) ×1 IMPLANT
SCISSORS LAP 5X35 DISP (ENDOMECHANICALS) ×1 IMPLANT
SET CHOLANGIOGRAPH MIX (MISCELLANEOUS) IMPLANT
SET TUBE SMOKE EVAC HIGH FLOW (TUBING) ×1 IMPLANT
SLEEVE ADV FIXATION 5X100MM (TROCAR) ×1 IMPLANT
SPIKE FLUID TRANSFER (MISCELLANEOUS) ×1 IMPLANT
STRIP CLOSURE SKIN 1/2X4 (GAUZE/BANDAGES/DRESSINGS) ×1 IMPLANT
SUT MNCRL AB 4-0 PS2 18 (SUTURE) ×1 IMPLANT
SUT VIC AB 0 UR5 27 (SUTURE) IMPLANT
SUT VICRYL 0 TIES 12 18 (SUTURE) IMPLANT
SUT VICRYL 0 UR6 27IN ABS (SUTURE) ×1 IMPLANT
TOWEL OR 17X26 10 PK STRL BLUE (TOWEL DISPOSABLE) ×1 IMPLANT
TRAY LAPAROSCOPIC (CUSTOM PROCEDURE TRAY) ×1 IMPLANT
TROCAR ADV FIXATION 12X100MM (TROCAR) IMPLANT
TROCAR ADV FIXATION 5X100MM (TROCAR) ×1 IMPLANT
TROCAR BALLN 12MMX100 BLUNT (TROCAR) IMPLANT
TROCAR XCEL NON-BLD 5MMX100MML (ENDOMECHANICALS) IMPLANT

## 2024-06-12 NOTE — Discharge Instructions (Signed)

## 2024-06-12 NOTE — Op Note (Signed)
 Alexandra Mendoza [id number] [birth year] 06/12/2024  Laparoscopic Cholecystectomy with near infrared fluorescent cholangiography procedure Note  Indications: This patient presents with symptomatic gallbladder disease and will undergo laparoscopic cholecystectomy.  Pre-operative Diagnosis: symptomatic cholelithiasis  Post-operative Diagnosis: Same  Surgeon: Camellia Blush MD FACS  Assistants: none  Anesthesia: General endotracheal anesthesia  Procedure Details  The patient was seen again in the Holding Room. The risks, benefits, complications, treatment options, and expected outcomes were discussed with the patient. The possibilities of reaction to medication, pulmonary aspiration, perforation of viscus, bleeding, recurrent infection, finding a normal gallbladder, the need for additional procedures, failure to diagnose a condition, the possible need to convert to an open procedure, and creating a complication requiring transfusion or operation were discussed with the patient. The likelihood of improving the patient's symptoms with return to their baseline status is good.  The patient and/or family concurred with the proposed plan, giving informed consent. The site of surgery properly noted. The patient was taken to Operating Room, identified as Alexandra Mendoza and the procedure verified as Laparoscopic Cholecystectomy with ICG dye.  A Time Out was held and the above information confirmed. Antibiotic prophylaxis was administered.    ICG dye was administered preoperatively.    General endotracheal anesthesia was then administered and tolerated well. After the induction, the abdomen was prepped with Chloraprep and draped in the sterile fashion. The patient was positioned in the supine position.  Local anesthetic agent was injected into the skin near the umbilicus and an incision made. We dissected down to the abdominal fascia with blunt dissection.  The fascia was incised vertically and we entered the  peritoneal cavity bluntly.  A pursestring suture of 0-Vicryl was placed around the fascial opening.  The Hasson cannula was inserted and secured with the stay suture.  Pneumoperitoneum was then created with CO2 and tolerated well without any adverse changes in the patient's vital signs. An 5-mm port was placed in the subxiphoid position.  Two 5-mm ports were placed in the right upper quadrant. All skin incisions were infiltrated with a local anesthetic agent before making the incision and placing the trocars.   We positioned the patient in reverse Trendelenburg, tilted slightly to the patient's left.  The gallbladder was identified, the fundus grasped and retracted cephalad. Adhesions were lysed bluntly and with the electrocautery where indicated, taking care not to injure any adjacent organs or viscus. The infundibulum was grasped and retracted laterally, exposing the peritoneum overlying the triangle of Calot. This was then divided and exposed in a blunt fashion. A critical view of the cystic duct and cystic artery was obtained.  The cystic duct was clearly identified and bluntly dissected circumferentially.  Utilizing the [employer] camera system near infrared fluorescent activity was visualized in the liver, cystic duct, common hepatic duct and common bile duct.  This served as a secondary confirmation of our anatomy.  The cystic duct was then ligated with 3 clips proximally and 1 distally and divided. The cystic artery which had been identified & dissected free was ligated with3 clips proximally and 1 distally and divided as well.   The gallbladder was dissected from the liver bed in retrograde fashion with the electrocautery. The gallbladder was removed and placed in an Ecco sac.  The gallbladder and Ecco sac were then removed through the umbilical port site. The liver bed was irrigated and inspected. Hemostasis was achieved with the electrocautery. Copious irrigation was utilized and was repeatedly  aspirated until clear.  The pursestring suture was  used to close the umbilical fascia.  An additional interrupted 0 Vicryl was placed using a PMI suture passer with laparoscopic guidance  We again inspected the right upper quadrant for hemostasis.  The umbilical closure was inspected and there was no air leak and nothing trapped within the closure. Pneumoperitoneum was released as we removed the trocars.  4-0 Monocryl was used to close the skin.   Steri strips and sterile dressings were applied. The patient was then extubated and brought to the recovery room in stable condition. Instrument, sponge, and needle counts were correct at closure and at the conclusion of the case.   Findings: Cholelithiasis critical view Infrared fluorescent cholangiography visualized within the common hepatic duct, common bile duct, cystic duct   Estimated Blood Loss: Minimal         Drains: none         Specimens: Gallbladder           Complications: None; patient tolerated the procedure well.         Disposition: PACU - hemodynamically stable.         Condition: stable  Camellia HERO. Tanda, MD, FACS General, Bariatric, & Minimally Invasive Surgery [facility],  [facility]

## 2024-06-12 NOTE — Anesthesia Procedure Notes (Signed)
 Procedure Name: Intubation Date/Time: 06/12/2024 7:43 AM  Performed by: Augusta Daved SAILOR, CRNAPre-anesthesia Checklist: Patient identified, Emergency Drugs available, Suction available and Patient being monitored Patient Re-evaluated:Patient Re-evaluated prior to induction Oxygen Delivery Method: Circle System Utilized Preoxygenation: Pre-oxygenation with 100% oxygen Induction Type: IV induction Ventilation: Mask ventilation without difficulty Laryngoscope Size: Miller and 2 Grade View: Grade II Tube type: Oral Tube size: 7.5 mm Number of attempts: 1 Airway Equipment and Method: Stylet and Oral airway Placement Confirmation: ETT inserted through vocal cords under direct vision, positive ETCO2 and breath sounds checked- equal and bilateral Secured at: 23 (at the lip) cm Tube secured with: Tape Dental Injury: Teeth and Oropharynx as per pre-operative assessment

## 2024-06-12 NOTE — Anesthesia Postprocedure Evaluation (Signed)
 Anesthesia Post Note  Patient: Alexandra Mendoza  Procedure(s) Performed: LAPAROSCOPIC CHOLECYSTECTOMY WITH ICG DYE (Abdomen)     Patient location during evaluation: PACU Anesthesia Type: General Level of consciousness: awake and alert Pain management: pain level controlled Vital Signs Assessment: post-procedure vital signs reviewed and stable Respiratory status: spontaneous breathing, nonlabored ventilation, respiratory function stable and patient connected to nasal cannula oxygen Cardiovascular status: blood pressure returned to baseline and stable Postop Assessment: no apparent nausea or vomiting Anesthetic complications: no   No notable events documented.  Last Vitals:  Vitals:   06/12/24 0915 06/12/24 0930  BP: (!) 118/54 (!) 129/54  Pulse: 60 63  Resp: 14 15  Temp:    SpO2: 98% 99%    Last Pain:  Vitals:   06/12/24 0930  TempSrc:   PainSc: 0-No pain                 Cordella P Olden Klauer

## 2024-06-12 NOTE — Transfer of Care (Signed)
 Immediate Anesthesia Transfer of Care Note  Patient: Alexandra Mendoza  Procedure(s) Performed: LAPAROSCOPIC CHOLECYSTECTOMY WITH ICG DYE (Abdomen)  Patient Location: PACU  Anesthesia Type:General  Level of Consciousness: drowsy  Airway & Oxygen Therapy: Patient Spontanous Breathing and Patient connected to face mask oxygen  Post-op Assessment: Report given to RN and Post -op Vital signs reviewed and stable  Post vital signs: Reviewed and stable  Last Vitals:  Vitals Value Taken Time  BP 120/52 06/12/24 08:45  Temp    Pulse 66 06/12/24 08:46  Resp 13 06/12/24 08:46  SpO2 100 % 06/12/24 08:46  Vitals shown include unfiled device data.  Last Pain:  Vitals:   06/12/24 0558  TempSrc:   PainSc: 0-No pain      Patients Stated Pain Goal: 4 (06/12/24 0558)  Complications: No notable events documented.

## 2024-06-12 NOTE — H&P (Signed)
 CC: here for surgery  Requesting provider: n/a  HPI: Alexandra Mendoza is an 73 y.o. female who is here for lap chole.  No changes since seen in clinic.   Old hpi: Alexandra Mendoza is a 73 y.o. female who is seen today as an office consultation at the request of Dr. Glendia for evaluation of New Consultation (Distended gallbladder, multiple shadowing stones. No wall thickening or adjacent fluid, some component of sludge as well) .   History of Present Illness Alexandra Mendoza is a 73 year old female with gallstones who presents with abdominal pain and bloating. She was referred by Dr. San for evaluation of her abdominal symptoms.  She experiences bloating, tightness, and fullness in her upper abdomen, particularly in the evenings after dinner. These symptoms have been present for several weeks and are exacerbated by certain foods, such as tomatoes and greasy foods. The discomfort sometimes radiates to her back and shoulder blade. She manages the symptoms by eating less and taking Pepcid , which provides some relief.  She has been taking pantoprazole  twice daily. An ultrasound revealed gallstones. She recalls a similar episode at the beach after consuming a tomato sandwich, which worsened her condition that evening but subsided overnight.  She uses Benefiber and Miralax daily to manage constipation, which has improved her bowel regularity. She reports having a bowel movement this morning and notes that she had constipation prior to starting this regimen.  No vomiting, chest pain, or chest pressure. She is able to walk and shop without experiencing chest discomfort. She has a history of seeing a cardiologist, who recently cleared her for surgery, and she has a routine echocardiogram scheduled for September 3rd.   Past Medical History:  Diagnosis Date   Acid reflux    Atrial fibrillation (post-op CABG 2015)    Cancer (HCC)    Colon polyp [K63.5]    Coronary artery disease    a. 2015 CABG  x 3: LIMA->LAD, VG->RI->OM1; b. 10/2022 MV: EF 65%, no isch/infarct.   Heart murmur    History of chicken pox    Hyperlipidemia    Hypertension    Insomnia    Moderate mitral regurgitation    a. 10/2022 Echo: EF >55%, mod MR.    Past Surgical History:  Procedure Laterality Date   APPENDECTOMY     BREAST EXCISIONAL BIOPSY Right    negative over 5 years ago- neg   BREAST SURGERY     Biopsy   COLONOSCOPY WITH PROPOFOL  N/A 05/16/2016   Procedure: COLONOSCOPY WITH PROPOFOL ;  Surgeon: Rogelia Copping, MD;  Location: [facility] ENDOSCOPY;  Service: Endoscopy;  Laterality: N/A;   CORONARY ARTERY BYPASS GRAFT  05/01/2014   3 vessel, [facility]   ESOPHAGOGASTRODUODENOSCOPY (EGD) WITH PROPOFOL  N/A 02/23/2017   Procedure: ESOPHAGOGASTRODUODENOSCOPY (EGD) WITH PROPOFOL ;  Surgeon: Copping Rogelia, MD;  Location: [facility];  Service: Endoscopy;  Laterality: N/A;   HYSTEROSCOPY WITH D & C     lipoma removed     removed from forehead   skin lesions removed  01/2023   triple bypass      Family History  Problem Relation Age of Onset   Heart disease Mother    Stroke Mother    Alzheimer's disease Mother    Heart disease Father    Stroke Father    Breast cancer Sister 74   Alzheimer's disease Maternal Grandmother    Alzheimer's disease Maternal Aunt    Alzheimer's disease Maternal Uncle    Colon cancer  Neg Hx    Liver disease Neg Hx    Esophageal cancer Neg Hx     Social:  reports that she has never smoked. She has never used smokeless tobacco. She reports that she does not drink alcohol and does not use drugs.  Allergies:  Allergies  Allergen Reactions   Valsartan Hives and Rash   Hydralazine  Other (See Comments)    Other reaction(s): Dizziness Patient felt like she was going to pass out     Medications: I have reviewed the patient's current medications.   ROS - all of the below systems have been reviewed with the patient and positives are indicated with bold  text General: chills, fever or night sweats Eyes: blurry vision or double vision ENT: epistaxis or sore throat Allergy/Immunology: itchy/watery eyes or nasal congestion Hematologic/Lymphatic: bleeding problems, blood clots or swollen lymph nodes Endocrine: temperature intolerance or unexpected weight changes Breast: new or changing breast lumps or nipple discharge Resp: cough, shortness of breath, or wheezing CV: chest pain or dyspnea on exertion GI: as per HPI GU: dysuria, trouble voiding, or hematuria MSK: joint pain or joint stiffness Neuro: TIA or stroke symptoms Derm: pruritus and skin lesion changes Psych: anxiety and depression  PE Blood pressure (!) 132/56, pulse 64, temperature 98 F (36.7 C), temperature source Oral, resp. rate 16, height 5' 9 (1.753 m), weight 72.6 kg, SpO2 100%. Constitutional: NAD; conversant; no deformities Eyes: Moist conjunctiva; no lid lag; anicteric; PERRL Neck: Trachea midline; no thyromegaly Lungs: Normal respiratory effort; no tactile fremitus CV: RRR; no palpable thrills; no pitting edema GI: Abd soft, nt, nd; no palpable hepatosplenomegaly MSK: Normal gait; no clubbing/cyanosis Psychiatric: Appropriate affect; alert and oriented x3 Lymphatic: No palpable cervical or axillary lymphadenopathy Skin:no rash  No results found for this or any previous visit (from the past 48 hours).  No results found.  Imaging: reviewed  A/P: Alexandra Mendoza is an 73 y.o. female with symptomatic cholelithiasis To OR for Lap chole  IV abx Eras All questions asked and answered  Camellia HERO. Tanda, MD, FACS General, Bariatric, & Minimally Invasive Surgery [facility]

## 2024-06-13 ENCOUNTER — Encounter (HOSPITAL_COMMUNITY): Payer: Self-pay | Admitting: General Surgery

## 2024-06-13 LAB — SURGICAL PATHOLOGY

## 2024-06-16 ENCOUNTER — Other Ambulatory Visit

## 2024-06-18 ENCOUNTER — Ambulatory Visit: Admitting: Internal Medicine

## 2024-06-23 ENCOUNTER — Encounter: Payer: Self-pay | Admitting: Internal Medicine

## 2024-06-23 NOTE — Telephone Encounter (Signed)
 Please call her and thank her for the update. Blood pressures are varying, but she just had surgery. Hold on making any changes in medication. Continue to spot check pressures. Let us  know if any problems.

## 2024-06-23 NOTE — Telephone Encounter (Signed)
Pt aware of below.

## 2024-06-30 ENCOUNTER — Ambulatory Visit (INDEPENDENT_AMBULATORY_CARE_PROVIDER_SITE_OTHER)

## 2024-06-30 ENCOUNTER — Other Ambulatory Visit: Payer: Self-pay

## 2024-06-30 DIAGNOSIS — I10 Essential (primary) hypertension: Secondary | ICD-10-CM | POA: Diagnosis not present

## 2024-06-30 DIAGNOSIS — Z23 Encounter for immunization: Secondary | ICD-10-CM

## 2024-06-30 LAB — BASIC METABOLIC PANEL WITH GFR
BUN: 20 mg/dL (ref 6–23)
CO2: 29 meq/L (ref 19–32)
Calcium: 10 mg/dL (ref 8.4–10.5)
Chloride: 103 meq/L (ref 96–112)
Creatinine, Ser: 1.13 mg/dL (ref 0.40–1.20)
GFR: 48.4 mL/min — ABNORMAL LOW (ref >60.00)
Glucose, Bld: 90 mg/dL (ref 70–99)
Potassium: 4.4 meq/L (ref 3.5–5.1)
Sodium: 138 meq/L (ref 135–145)

## 2024-07-01 ENCOUNTER — Ambulatory Visit: Payer: Self-pay | Admitting: Internal Medicine

## 2024-07-01 DIAGNOSIS — I10 Essential (primary) hypertension: Secondary | ICD-10-CM

## 2024-07-01 NOTE — Telephone Encounter (Signed)
 Copied from CRM #[id number]. Topic: Clinical - Lab/Test Results >> Jul 01, 2024 10:37 AM Rea BROCKS wrote: Reason for CRM: Patient was already on the phone to discuss lab results. Patient stated that someone can call her back if needed. But looks like patient discussed lab results with another CMA in the clinic.

## 2024-07-04 NOTE — Addendum Note (Signed)
 Addended by: MORGAN LEVORA RAMAN on: 07/04/2024 08:53 AM   Modules accepted: Orders

## 2024-07-11 ENCOUNTER — Other Ambulatory Visit: Payer: Self-pay | Admitting: Internal Medicine

## 2024-07-16 ENCOUNTER — Ambulatory Visit
Admission: RE | Admit: 2024-07-16 | Discharge: 2024-07-16 | Disposition: A | Discharge status: Home / Self Care | Source: Ambulatory Visit | Attending: Internal Medicine | Admitting: Internal Medicine

## 2024-07-16 DIAGNOSIS — Z1231 Encounter for screening mammogram for malignant neoplasm of breast: Secondary | ICD-10-CM | POA: Diagnosis not present

## 2024-07-18 ENCOUNTER — Other Ambulatory Visit (INDEPENDENT_AMBULATORY_CARE_PROVIDER_SITE_OTHER)

## 2024-07-18 DIAGNOSIS — D2272 Melanocytic nevi of left lower limb, including hip: Secondary | ICD-10-CM | POA: Diagnosis not present

## 2024-07-18 DIAGNOSIS — L820 Inflamed seborrheic keratosis: Secondary | ICD-10-CM | POA: Diagnosis not present

## 2024-07-18 DIAGNOSIS — D225 Melanocytic nevi of trunk: Secondary | ICD-10-CM | POA: Diagnosis not present

## 2024-07-18 DIAGNOSIS — Z85828 Personal history of other malignant neoplasm of skin: Secondary | ICD-10-CM | POA: Diagnosis not present

## 2024-07-18 DIAGNOSIS — D2262 Melanocytic nevi of left upper limb, including shoulder: Secondary | ICD-10-CM | POA: Diagnosis not present

## 2024-07-18 DIAGNOSIS — L538 Other specified erythematous conditions: Secondary | ICD-10-CM | POA: Diagnosis not present

## 2024-07-18 DIAGNOSIS — I10 Essential (primary) hypertension: Secondary | ICD-10-CM | POA: Diagnosis not present

## 2024-07-18 DIAGNOSIS — D2261 Melanocytic nevi of right upper limb, including shoulder: Secondary | ICD-10-CM | POA: Diagnosis not present

## 2024-07-18 DIAGNOSIS — D0472 Carcinoma in situ of skin of left lower limb, including hip: Secondary | ICD-10-CM | POA: Diagnosis not present

## 2024-07-18 DIAGNOSIS — L570 Actinic keratosis: Secondary | ICD-10-CM | POA: Diagnosis not present

## 2024-07-18 DIAGNOSIS — D485 Neoplasm of uncertain behavior of skin: Secondary | ICD-10-CM | POA: Diagnosis not present

## 2024-07-18 LAB — BASIC METABOLIC PANEL WITH GFR
BUN: 15 mg/dL (ref 6–23)
CO2: 29 meq/L (ref 19–32)
Calcium: 10.3 mg/dL (ref 8.4–10.5)
Chloride: 105 meq/L (ref 96–112)
Creatinine, Ser: 0.98 mg/dL (ref 0.40–1.20)
GFR: 57.41 mL/min — ABNORMAL LOW (ref >60.00)
Glucose, Bld: 83 mg/dL (ref 70–99)
Potassium: 4.6 meq/L (ref 3.5–5.1)
Sodium: 141 meq/L (ref 135–145)

## 2024-07-20 ENCOUNTER — Ambulatory Visit: Payer: Self-pay | Admitting: Internal Medicine

## 2024-07-21 ENCOUNTER — Ambulatory Visit: Admitting: Internal Medicine

## 2024-07-21 ENCOUNTER — Encounter: Payer: Self-pay | Admitting: Internal Medicine

## 2024-07-21 VITALS — BP 112/76 | HR 68 | Temp 97.6°F | Ht 69.0 in | Wt 156.6 lb

## 2024-07-21 DIAGNOSIS — E538 Deficiency of other specified B group vitamins: Secondary | ICD-10-CM | POA: Diagnosis not present

## 2024-07-21 DIAGNOSIS — I2510 Atherosclerotic heart disease of native coronary artery without angina pectoris: Secondary | ICD-10-CM

## 2024-07-21 DIAGNOSIS — R140 Abdominal distension (gaseous): Secondary | ICD-10-CM | POA: Diagnosis not present

## 2024-07-21 DIAGNOSIS — G629 Polyneuropathy, unspecified: Secondary | ICD-10-CM

## 2024-07-21 DIAGNOSIS — K2100 Gastro-esophageal reflux disease with esophagitis, without bleeding: Secondary | ICD-10-CM

## 2024-07-21 DIAGNOSIS — Z1231 Encounter for screening mammogram for malignant neoplasm of breast: Secondary | ICD-10-CM | POA: Diagnosis not present

## 2024-07-21 DIAGNOSIS — R1013 Epigastric pain: Secondary | ICD-10-CM | POA: Diagnosis not present

## 2024-07-21 DIAGNOSIS — E7800 Pure hypercholesterolemia, unspecified: Secondary | ICD-10-CM

## 2024-07-21 DIAGNOSIS — I10 Essential (primary) hypertension: Secondary | ICD-10-CM

## 2024-07-21 DIAGNOSIS — F419 Anxiety disorder, unspecified: Secondary | ICD-10-CM

## 2024-07-21 MED ORDER — PREGABALIN 75 MG PO CAPS: 75.0000 mg | ORAL_CAPSULE | Freq: Two times a day (BID) | ORAL | 2 refills | Status: DC

## 2024-07-21 MED ORDER — IRBESARTAN 300 MG PO TABS: 300.0000 mg | ORAL_TABLET | Freq: Every evening | ORAL | 1 refills | Status: AC

## 2024-07-21 MED ORDER — ATORVASTATIN CALCIUM 40 MG PO TABS: 40.0000 mg | ORAL_TABLET | Freq: Every day | ORAL | 1 refills | Status: AC

## 2024-07-21 NOTE — Progress Notes (Signed)
 Subjective:    [name], female    DOB: 1951/02/28, 73 y.o.   MRN: [id number]  Patient here for  Chief Complaint  Patient presents with   Medical Management of Chronic Issues    HPI Here for a scheduled follow up - follow up regarding CAD, GERD, hypercholesterolemia, neuropathy and hypertension. /Is sp lap cholecystectomy 06/12/24. Had f/u with surgery 07/03/24 - reported discomfort in her upper abdomen radiating to her back and shoulder blade after eating. Denies diarrhea. Recommended to follow symptoms. EGD 03/2024 multipel gastric polyps. Saw cardiology 04/22/24 - stable. Continue aspirin, statin, ARB and calcium channel blocker. Was off spironolactone  and hydrochlorothiazide  due to lightheadedness and pressure 90s. With fluid retention, restarted spironolactone  and hydrochlorothiazide . Last visit with me 06/06/24 - ask her to hold hydrochlorothiazide .  Reviewed outside blood pressures. Blood pressures are trending down. Most averaging around 130/70s. No chest pain or sob reported. The right posterior back/shoulder pain and right side pain - resolved. No pain over the last few days. Watching her diet. No vomiting. Bowels doing better. Discussed taking miralax and benefiber every other day to keep her bowels moving. Has f/u with GI 07/23/24.    Past Medical History:  Diagnosis Date   Acid reflux    Atrial fibrillation (post-op CABG 2015)    Cancer (HCC)    Colon polyp [K63.5]    Coronary artery disease    a. 2015 CABG x 3: LIMA->LAD, VG->RI->OM1; b. 10/2022 MV: EF 65%, no isch/infarct.   Heart murmur    History of chicken pox    Hyperlipidemia    Hypertension    Insomnia    Moderate mitral regurgitation    a. 10/2022 Echo: EF >55%, mod MR.   Past Surgical History:  Procedure Laterality Date   APPENDECTOMY     BREAST EXCISIONAL BIOPSY Right    negative over 5 years ago- neg   BREAST SURGERY     Biopsy   CHOLECYSTECTOMY N/A 06/12/2024   Procedure: LAPAROSCOPIC  CHOLECYSTECTOMY WITH ICG DYE;  Surgeon: Tanda Locus, MD;  Location: [facility];  Service: General;  Laterality: N/A;   COLONOSCOPY WITH PROPOFOL  N/A 05/16/2016   Procedure: COLONOSCOPY WITH PROPOFOL ;  Surgeon: Rogelia Copping, MD;  Location: [facility];  Service: Endoscopy;  Laterality: N/A;   CORONARY ARTERY BYPASS GRAFT  05/01/2014   3 vessel, [facility]   ESOPHAGOGASTRODUODENOSCOPY (EGD) WITH PROPOFOL  N/A 02/23/2017   Procedure: ESOPHAGOGASTRODUODENOSCOPY (EGD) WITH PROPOFOL ;  Surgeon: Copping Rogelia, MD;  Location: [facility];  Service: Endoscopy;  Laterality: N/A;   HYSTEROSCOPY WITH D & C     lipoma removed     removed from forehead   skin lesions removed  01/2023   triple bypass     Family History  Problem Relation Age of Onset   Heart disease Mother    Stroke Mother    Alzheimer's disease Mother    Heart disease Father    Stroke Father    Breast cancer Sister 22   Alzheimer's disease Maternal Grandmother    Alzheimer's disease Maternal Aunt    Alzheimer's disease Maternal Uncle    Colon cancer Neg Hx    Liver disease Neg Hx    Esophageal cancer Neg Hx    Social History   Socioeconomic History   Marital status: Married    Spouse name: Not on file   Number of children: 1   Years of education: Not on file   Highest education level: Not on  file  Occupational History   Not on file  Tobacco Use   Smoking status: Never   Smokeless tobacco: Never  Vaping Use   Vaping status: Never Used  Substance and Sexual Activity   Alcohol use: Never    Alcohol/week: 0.0 standard drinks of alcohol   Drug use: No   Sexual activity: Yes    Birth control/protection: Post-menopausal  Other Topics Concern   Not on file  Social History Narrative   Married   Social Drivers of Health   Financial Resource Strain: Low Risk  (10/08/2023)   Overall Financial Resource Strain (CARDIA)    Difficulty of Paying Living Expenses: Not hard at all  Food Insecurity: No Food  Insecurity (10/08/2023)   Hunger Vital Sign    Worried About Running Out of Food in the Last Year: Never true    Ran Out of Food in the Last Year: Never true  Transportation Needs: No Transportation Needs (10/08/2023)   PRAPARE - Administrator, Civil Service (Medical): No    Lack of Transportation (Non-Medical): No  Physical Activity: Insufficiently Active (10/08/2023)   Exercise Vital Sign    Days of Exercise per Week: 3 days    Minutes of Exercise per Session: 40 min  Stress: No Stress Concern Present (10/08/2023)   [employer] of Occupational Health - Occupational Stress Questionnaire    Feeling of Stress : Not at all  Social Connections: Moderately Integrated (10/08/2023)   Social Connection and Isolation Panel    Frequency of Communication with Friends and Family: More than three times a week    Frequency of Social Gatherings with Friends and Family: More than three times a week    Attends Religious Services: More than 4 times per year    Active Member of [employer] or Organizations: No    Attends Banker Meetings: Never    Marital Status: Married     Review of Systems  Constitutional:  Negative for appetite change and unexpected weight change.  HENT:  Negative for congestion and sinus pressure.   Respiratory:  Negative for cough, chest tightness and shortness of breath.   Cardiovascular:  Negative for chest pain and palpitations.  Gastrointestinal:  Negative for abdominal pain, diarrhea, nausea and vomiting.  Genitourinary:  Negative for difficulty urinating and dysuria.  Musculoskeletal:  Negative for joint swelling and myalgias.  Skin:  Negative for color change and rash.  Neurological:  Negative for dizziness and headaches.  Psychiatric/Behavioral:  Negative for agitation and dysphoric mood.        Objective:     BP 112/76   Pulse 68   Temp 97.6 F (36.4 C) (Oral)   Ht 5' 9 (1.753 m)   Wt 156 lb 9.6 oz (71 kg)   SpO2 99%   BMI 23.13  kg/m  Wt Readings from Last 3 Encounters:  07/23/24 158 lb 6 oz (71.8 kg)  07/21/24 156 lb 9.6 oz (71 kg)  06/12/24 160 lb (72.6 kg)    Physical Exam Vitals reviewed.  Constitutional:      General: She is not in acute distress.    Appearance: Normal appearance.  HENT:     Head: Normocephalic and atraumatic.     Right Ear: External ear normal.     Left Ear: External ear normal.     Mouth/Throat:     Pharynx: No oropharyngeal exudate or posterior oropharyngeal erythema.  Eyes:     General: No scleral icterus.  Right eye: No discharge.        Left eye: No discharge.     Conjunctiva/sclera: Conjunctivae normal.  Neck:     Thyroid : No thyromegaly.  Cardiovascular:     Rate and Rhythm: Normal rate and regular rhythm.  Pulmonary:     Effort: No respiratory distress.     Breath sounds: Normal breath sounds. No wheezing.  Abdominal:     General: Bowel sounds are normal.     Palpations: Abdomen is soft.     Tenderness: There is no abdominal tenderness.  Musculoskeletal:        General: No swelling or tenderness.     Cervical back: Neck supple. No tenderness.  Lymphadenopathy:     Cervical: No cervical adenopathy.  Skin:    Findings: No erythema or rash.  Neurological:     Mental Status: She is alert.  Psychiatric:        Mood and Affect: Mood normal.        Behavior: Behavior normal.         Outpatient Encounter Medications as of 07/21/2024  Medication Sig   ALPHA-LIPOIC ACID PO Take 600 mg by mouth in the morning.   amLODipine  (NORVASC ) 10 MG tablet Take 1 tablet (10 mg total) by mouth daily.   ascorbic acid (VITAMIN C) 1000 MG tablet Take 1 tablet by mouth in the morning.   aspirin EC 81 MG tablet Take 81 mg by mouth in the morning.   carvedilol  (COREG ) 6.25 MG tablet Take 1 tablet (6.25 mg total) by mouth 2 (two) times daily with a meal.   Cholecalciferol (VITAMIN D3) 1000 units CAPS Take 1,000 Units by mouth in the morning.   cyanocobalamin  (VITAMIN B12)  1000 MCG tablet Take 1,000 mcg by mouth daily.   docusate sodium (COLACE) 100 MG capsule Take 200 mg by mouth in the morning.   Famotidine  (PEPCID  AC PO) Take 1-2 tablets by mouth daily as needed (indigestion/heartburn.).   Multiple Minerals-Vitamins (CAL MAG ZINC +D3 PO) Take 1 tablet by mouth in the morning.   Omega-3 Fatty Acids (FISH OIL PO) Take 1,200 mg by mouth in the morning.   OVER THE COUNTER MEDICATION Take 1 Dose by mouth daily as needed (indigestion/heartburn). Reflux Gourmet Paste use 1 teaspoonful   pantoprazole  (PROTONIX ) 40 MG tablet Take 40 mg by mouth 2 (two) times daily.   polyethylene glycol (MIRALAX / GLYCOLAX) 17 g packet Take 17 g by mouth in the morning.   Probiotic Product (PROBIOTIC PO) Take 1 capsule by mouth in the morning.   spironolactone  (ALDACTONE ) 25 MG tablet Take 0.5 tablets (12.5 mg total) by mouth daily.   traMADol (ULTRAM) 50 MG tablet Take 1 tablet (50 mg total) by mouth every 6 (six) hours as needed for moderate pain (pain score 4-6) or severe pain (pain score 7-10).   traZODone  (DESYREL ) 50 MG tablet Take 1-2 tablets (50-100 mg total) by mouth at bedtime as needed.   [DISCONTINUED] Bacillus Coagulans-Guar Gum (BENEFIBER ADVANCED PO) Take 1 Dose by mouth in the morning.   [DISCONTINUED] hydrochlorothiazide  (MICROZIDE ) 12.5 MG capsule Take 12.5 mg by mouth in the morning. (Patient not taking: Reported on 07/23/2024)   [DISCONTINUED] irbesartan  (AVAPRO ) 300 MG tablet TAKE 1 TABLET BY MOUTH EVERY DAY   atorvastatin (LIPITOR) 40 MG tablet Take 1 tablet (40 mg total) by mouth daily.   irbesartan  (AVAPRO ) 300 MG tablet Take 1 tablet (300 mg total) by mouth every evening.   pregabalin  (LYRICA ) 75 MG capsule  Take 1 capsule (75 mg total) by mouth 2 (two) times daily.   [DISCONTINUED] atorvastatin (LIPITOR) 40 MG tablet TAKE 1 TABLET BY MOUTH EVERY DAY   [DISCONTINUED] irbesartan  (AVAPRO ) 300 MG tablet TAKE 1 TABLET BY MOUTH EVERY DAY (Patient taking differently:  Take 300 mg by mouth every evening.)   [DISCONTINUED] pregabalin  (LYRICA ) 75 MG capsule Take 1 capsule (75 mg total) by mouth 2 (two) times daily.   No facility-administered encounter medications on file as of 07/21/2024.     Lab Results  Component Value Date   WBC 6.7 06/03/2024   HGB 12.4 06/03/2024   HCT 38.8 06/03/2024   PLT 183 06/03/2024   GLUCOSE 83 07/18/2024   CHOL 114 05/30/2024   TRIG 98.0 05/30/2024   HDL 35.00 (L) 05/30/2024   LDLCALC 59 05/30/2024   ALT 17 06/03/2024   AST 21 06/03/2024   NA 141 07/18/2024   K 4.6 07/18/2024   CL 105 07/18/2024   CREATININE 0.98 07/18/2024   BUN 15 07/18/2024   CO2 29 07/18/2024   TSH 3.34 06/11/2023    MM 3D SCREENING MAMMOGRAM BILATERAL BREAST Result Date: 07/18/2024 CLINICAL DATA:  Screening. EXAM: DIGITAL SCREENING BILATERAL MAMMOGRAM WITH TOMOSYNTHESIS AND CAD TECHNIQUE: Bilateral screening digital craniocaudal and mediolateral oblique mammograms were obtained. Bilateral screening digital breast tomosynthesis was performed. The images were evaluated with computer-aided detection. COMPARISON:  Previous exam(s). ACR Breast Density Category b: There are scattered areas of fibroglandular density. FINDINGS: There are no findings suspicious for malignancy. IMPRESSION: No mammographic evidence of malignancy. A result letter of this screening mammogram will be mailed directly to the patient. RECOMMENDATION: Screening mammogram in one year. (Code:SM-B-01Y) BI-RADS CATEGORY  1: Negative. Electronically Signed   By: Alm Parkins M.D.   On: 07/18/2024 13:42       Assessment & Plan:  Encounter for screening mammogram for malignant neoplasm of breast -     3D Screening Mammogram, Left and Right; Future  Essential hypertension Assessment & Plan: Blood pressures reviewed (outside checks attached). Contiue amlodipine , coreg , avapro  and spironolactone . Pressures stable. No changes in medication today. Check metabolic panel.   Orders: -      Basic metabolic panel with GFR; Future  Hypercholesterolemia Assessment & Plan: Continue lipitor. Continue diet and exercise. Follow lipid panel.  Lab Results  Component Value Date   CHOL 114 05/30/2024   HDL 35.00 (L) 05/30/2024   LDLCALC 59 05/30/2024   TRIG 98.0 05/30/2024   CHOLHDL 3 05/30/2024     Orders: -     Lipid panel; Future -     Hepatic function panel; Future -     TSH; Future  Dyspepsia  Abdominal bloating  Neuropathy Assessment & Plan: NCS revealed polyneuropathy.  Continue lyrica  at current dose.  Overall stable.   Hypercalcemia Assessment & Plan: Off hydrochlorothiazide . Follow calcium level.    Gastroesophageal reflux disease with esophagitis without hemorrhage Assessment & Plan: Now taking protonix  20mg  in am and 40mg  q pm. Symptoms controlled. Follow. Continue to taper as able.    Coronary artery disease involving native coronary artery of native heart without angina pectoris Assessment & Plan: Saw Dr Florencio 10/23/22 - recommended myoview and echo.  Continue aspirin and atorvastatin.  Myoview overall showed good exercise tolerance walking 7 minutes under the Bruce protocol with no significant symptom development. No evidence of myocardial ischemia by Myoview. Echocardiogram had showed overall normal LV and RV systolic function, EF greater than 55% with moderate TR overall unchanged from 2022  saw Dr Gollan 01/29/23 - stable.  No changes made. Had f/u with Medford Meager and discussed upcoming surgery. Reviewed his recommendations  and he notes that from a cardiac standpoint, she has done well since her bypass surgery in 2015. She had a negative Myoview in 2024. She is able to walk 30 minutes daily without experiencing chest pain or dyspnea. She is capable of achieving 8 METS. ECG is unremarkable. No chest pain and no sob. No change in symptoms. Continue risk factor modification.    B12 deficiency Assessment & Plan: Check b12 level.     Anxiety Assessment & Plan: Continue trazodone  at night. Overall appears to be doing well. Follow.    Other orders -     Atorvastatin Calcium; Take 1 tablet (40 mg total) by mouth daily.  Dispense: 90 tablet; Refill: 1 -     Irbesartan ; Take 1 tablet (300 mg total) by mouth every evening.  Dispense: 90 tablet; Refill: 1 -     Pregabalin ; Take 1 capsule (75 mg total) by mouth 2 (two) times daily.  Dispense: 60 capsule; Refill: 2     Allena Hamilton, MD

## 2024-07-23 ENCOUNTER — Ambulatory Visit: Admitting: Gastroenterology

## 2024-07-23 ENCOUNTER — Encounter: Payer: Self-pay | Admitting: Gastroenterology

## 2024-07-23 ENCOUNTER — Other Ambulatory Visit (INDEPENDENT_AMBULATORY_CARE_PROVIDER_SITE_OTHER)

## 2024-07-23 VITALS — BP 136/74 | HR 60 | Ht 69.0 in | Wt 158.4 lb

## 2024-07-23 DIAGNOSIS — K8020 Calculus of gallbladder without cholecystitis without obstruction: Secondary | ICD-10-CM

## 2024-07-23 DIAGNOSIS — K219 Gastro-esophageal reflux disease without esophagitis: Secondary | ICD-10-CM | POA: Diagnosis not present

## 2024-07-23 DIAGNOSIS — Z9049 Acquired absence of other specified parts of digestive tract: Secondary | ICD-10-CM

## 2024-07-23 DIAGNOSIS — K5909 Other constipation: Secondary | ICD-10-CM

## 2024-07-23 LAB — VITAMIN D 25 HYDROXY (VIT D DEFICIENCY, FRACTURES): VITD: 39.11 ng/mL (ref 30.00–100.00)

## 2024-07-23 LAB — IBC + FERRITIN
Ferritin: 88.5 ng/mL (ref 10.0–291.0)
Iron: 110 ug/dL (ref 42–145)
Saturation Ratios: 43.2 % (ref 20.0–50.0)
TIBC: 254.8 ug/dL (ref 250.0–450.0)
Transferrin: 182 mg/dL — ABNORMAL LOW (ref 212.0–360.0)

## 2024-07-23 LAB — B12 AND FOLATE PANEL
Folate: 6.6 ng/mL (ref >5.9)
Vitamin B-12: 1431 pg/mL — ABNORMAL HIGH (ref 211–911)

## 2024-07-23 NOTE — Progress Notes (Signed)
 Chief Complaint:    Postoperative follow-up  GI History: 73 year old female with a history of GERD, A-fib (on ASA 81 mg), HTN, HLD, follows in the GI clinic for the following:  1) GERD.  Longstanding history of GERD with index symptoms of indigestion, upper abdominal pressure, bloating. Never heartburn, regurgitation.  Reflux symptoms improved with pantoprazole  40 mg twice daily along with on-demand Pepcid  and OTC reflux Gourmet. - 02/23/2017: EGD: Small hiatal hernia, otherwise normal - 04/08/2024: EGD: Normal esophagus, regular Z-line at 37 cm, Hill grade 2 valve, no hiatal hernia, fundic gland polyps  2) Chronic constipation.  Longstanding history of chronic constipation, typically responsive to Benefiber and MiraLAX daily. - 2017: Colonoscopy: Small inflammatory polyp, otherwise normal. Repeat in 2027.   3) Cholelithiasis with biliary colic. - 04/04/2024: Abdominal ultrasound: Gallbladder stones, sludge, no duct dilation - 06/12/2024: Lap cholecystectomy  HPI:     Patient is a 73 y.o. female presenting to the Gastroenterology Clinic for follow-up.  Was last seen by Deanna May on 05/01/2024.  At that time was having worsening reflux symptoms and abdominal bloating.  He suspected this was 2/2 biliary colic and was seen in the surgical clinic and scheduled for cholecystectomy.  Underwent lap ccy on 06/12/2024 and discharged home the following day without issue.  Had follow-up with Dr. Tanda on 07/03/2024.  Today, she states she is slowly improving since ccy. Some loose stools over last 2-3 days, worse with certain foods. Did use 1 imodium with good response.   Feels that her UGI symptoms are overall improving as well.  Last bloating and indigestion.  Symptoms are worse with bad food.  Symptoms improved with Pepcid  on demand.  She has decreased pantoprazole  to 20 mg QAM and 40 mg QPM without breakthrough and hopes to continue to decrease or possibly titrate off completely.   Review of  systems:     No chest pain, no SOB, no fevers, no urinary sx   Past Medical History:  Diagnosis Date   Acid reflux    Atrial fibrillation (post-op CABG 2015)    Cancer (HCC)    Colon polyp [K63.5]    Coronary artery disease    a. 2015 CABG x 3: LIMA->LAD, VG->RI->OM1; b. 10/2022 MV: EF 65%, no isch/infarct.   Heart murmur    History of chicken pox    Hyperlipidemia    Hypertension    Insomnia    Moderate mitral regurgitation    a. 10/2022 Echo: EF >55%, mod MR.    Patient's surgical history, family medical history, social history, medications and allergies were all reviewed in Epic    Current Outpatient Medications  Medication Sig Dispense Refill   ALPHA-LIPOIC ACID PO Take 600 mg by mouth in the morning.     amLODipine  (NORVASC ) 10 MG tablet Take 1 tablet (10 mg total) by mouth daily. 90 tablet 3   ascorbic acid (VITAMIN C) 1000 MG tablet Take 1 tablet by mouth in the morning.     aspirin EC 81 MG tablet Take 81 mg by mouth in the morning.     atorvastatin (LIPITOR) 40 MG tablet Take 1 tablet (40 mg total) by mouth daily. 90 tablet 1   carvedilol  (COREG ) 6.25 MG tablet Take 1 tablet (6.25 mg total) by mouth 2 (two) times daily with a meal. 180 tablet 4   Cholecalciferol (VITAMIN D3) 1000 units CAPS Take 1,000 Units by mouth in the morning.     cyanocobalamin  (VITAMIN B12) 1000 MCG tablet Take 1,000 mcg  by mouth daily.     docusate sodium (COLACE) 100 MG capsule Take 200 mg by mouth in the morning.     Famotidine  (PEPCID  AC PO) Take 1-2 tablets by mouth daily as needed (indigestion/heartburn.).     irbesartan  (AVAPRO ) 300 MG tablet Take 1 tablet (300 mg total) by mouth every evening. 90 tablet 1   Multiple Minerals-Vitamins (CAL MAG ZINC +D3 PO) Take 1 tablet by mouth in the morning.     Omega-3 Fatty Acids (FISH OIL PO) Take 1,200 mg by mouth in the morning.     OVER THE COUNTER MEDICATION Take 1 Dose by mouth daily as needed (indigestion/heartburn). Reflux Gourmet Paste use 1  teaspoonful     pantoprazole  (PROTONIX ) 40 MG tablet Take 40 mg by mouth 2 (two) times daily.     polyethylene glycol (MIRALAX / GLYCOLAX) 17 g packet Take 17 g by mouth in the morning.     pregabalin  (LYRICA ) 75 MG capsule Take 1 capsule (75 mg total) by mouth 2 (two) times daily. 60 capsule 2   Probiotic Product (PROBIOTIC PO) Take 1 capsule by mouth in the morning.     spironolactone  (ALDACTONE ) 25 MG tablet Take 0.5 tablets (12.5 mg total) by mouth daily.     traMADol (ULTRAM) 50 MG tablet Take 1 tablet (50 mg total) by mouth every 6 (six) hours as needed for moderate pain (pain score 4-6) or severe pain (pain score 7-10). 10 tablet 0   traZODone  (DESYREL ) 50 MG tablet Take 1-2 tablets (50-100 mg total) by mouth at bedtime as needed. 180 tablet 1   hydrochlorothiazide  (MICROZIDE ) 12.5 MG capsule Take 12.5 mg by mouth in the morning. (Patient not taking: Reported on 07/23/2024)     No current facility-administered medications for this visit.    Physical Exam:     BP 136/74 (BP Location: Left Arm, Patient Position: Sitting, Cuff Size: Normal)   Pulse 60   Ht 5' 9 (1.753 m)   Wt 158 lb 6 oz (71.8 kg)   BMI 23.39 kg/m   GENERAL:  Pleasant female in NAD PSYCH: : Cooperative, normal affect Musculoskeletal:  Normal muscle tone, normal strength NEURO: Alert and oriented x 3, no focal neurologic deficits   IMPRESSION and PLAN:    1) Indigestion 2) Dyspepsia Carries a prior diagnosis of GERD, but does not have typical reflux symptoms such as heartburn or regurgitation.  Symptoms mostly characterized by bloating, indigestion, UGI discomfort.  Recent EGD largely unremarkable. - Check Vit D, B12, folate, iron panel due to chronic PPI use - Agree with decreasing PPI and titrate to lowest effective dose or possibly discontinue with no further need for chronic acid suppression therapy - Depending on response to PPI wean, may benefit from OTC FDgard  3) Cholelithiasis s/p Cholecystectomy -  Recovering well after recent cholecystectomy - Discussed potential dietary modification and bowel habit changes after ccy  4) Chronic constipation Longstanding history of chronic constipation.  Has not been an issue since her cholecystectomy.  Will monitor  RTC in 1 year or sooner as needed  I spent 30 minutes of time, including in depth chart review, independent review of results as outlined above, communicating results with the patient directly, face-to-face time with the patient, coordinating care, and ordering studies and medications as appropriate, and documentation.           Sandor GAILS Doristine Shehan ,DO, FACG 07/23/2024, 9:06 AM

## 2024-07-23 NOTE — Patient Instructions (Addendum)
 _______________________________________________________  If your blood pressure at your visit was 140/90 or greater, please contact your primary care physician to follow up on this.  _______________________________________________________  If you are age 73 or older, your body mass index should be between 23-30. Your Body mass index is 23.39 kg/m. If this is out of the aforementioned range listed, please consider follow up with your Primary Care Provider.  If you are age 47 or younger, your body mass index should be between 19-25. Your Body mass index is 23.39 kg/m. If this is out of the aformentioned range listed, please consider follow up with your Primary Care Provider.   ________________________________________________________  The [facility] providers would like to encourage you to use MYCHART to communicate with providers for non-urgent requests or questions.  Due to long hold times on the telephone, sending your provider a message by [facility] may be a faster and more efficient way to get a response.  Please allow 48 business hours for a response.  Please remember that this is for non-urgent requests.  _______________________________________________________  [facility] is using a team-based approach to care.  Your team is made up of your doctor and two to three APPS. Our APPS (Nurse Practitioners and Physician Assistants) work with your physician to ensure care continuity for you. They are fully qualified to address your health concerns and develop a treatment plan. They communicate directly with your gastroenterologist to care for you. Seeing the Advanced Practice Practitioners on your physician's team can help you by facilitating care more promptly, often allowing for earlier appointments, access to diagnostic testing, procedures, and other specialty referrals.   Your provider has requested that you go to the basement level for lab work before leaving today. Press B on the  elevator. The lab is located at the first door on the left as you exit the elevator.  Due to recent changes in healthcare laws, you may see the results of your imaging and laboratory studies on MyChart before your provider has had a chance to review them.  We understand that in some cases there may be results that are confusing or concerning to you. Not all laboratory results come back in the same time frame and the provider may be waiting for multiple results in order to interpret others.  Please give us  48 hours in order for your provider to thoroughly review all the results before contacting the office for clarification of your results.   It was a pleasure to see you today!  Vito Cirigliano, D.O.

## 2024-07-24 ENCOUNTER — Ambulatory Visit: Payer: Self-pay | Admitting: Gastroenterology

## 2024-07-24 DIAGNOSIS — Z9049 Acquired absence of other specified parts of digestive tract: Secondary | ICD-10-CM

## 2024-07-24 DIAGNOSIS — K219 Gastro-esophageal reflux disease without esophagitis: Secondary | ICD-10-CM

## 2024-07-24 DIAGNOSIS — K5909 Other constipation: Secondary | ICD-10-CM

## 2024-07-24 DIAGNOSIS — K8020 Calculus of gallbladder without cholecystitis without obstruction: Secondary | ICD-10-CM

## 2024-07-25 MED ORDER — FOLIC ACID 1 MG PO TABS: 1.0000 mg | ORAL_TABLET | Freq: Every day | ORAL | 0 refills | Status: AC

## 2024-07-25 NOTE — Addendum Note (Signed)
 Addended by: KATHIE BOTTCHER E on: 07/25/2024 10:55 AM   Modules accepted: Orders

## 2024-07-25 NOTE — Telephone Encounter (Signed)
Patient requested script

## 2024-07-27 ENCOUNTER — Encounter: Payer: Self-pay | Admitting: Internal Medicine

## 2024-07-27 NOTE — Assessment & Plan Note (Signed)
 Continue trazodone  at night. Overall appears to be doing well. Follow.

## 2024-07-27 NOTE — Assessment & Plan Note (Signed)
 Blood pressures reviewed (outside checks attached). Contiue amlodipine , coreg , avapro  and spironolactone . Pressures stable. No changes in medication today. Check metabolic panel.

## 2024-07-27 NOTE — Assessment & Plan Note (Signed)
Check b12 level  

## 2024-07-27 NOTE — Assessment & Plan Note (Signed)
 Off hydrochlorothiazide . Follow calcium level.

## 2024-07-27 NOTE — Assessment & Plan Note (Signed)
 Now taking protonix  20mg  in am and 40mg  q pm. Symptoms controlled. Follow. Continue to taper as able.

## 2024-07-27 NOTE — Assessment & Plan Note (Signed)
 NCS revealed polyneuropathy.  Continue lyrica  at current dose.  Overall stable.

## 2024-07-27 NOTE — Assessment & Plan Note (Signed)
 Saw Dr Florencio 10/23/22 - recommended myoview and echo.  Continue aspirin and atorvastatin.  Myoview overall showed good exercise tolerance walking 7 minutes under the Bruce protocol with no significant symptom development. No evidence of myocardial ischemia by Myoview. Echocardiogram had showed overall normal LV and RV systolic function, EF greater than 55% with moderate TR overall unchanged from 2022  saw Dr Perla 01/29/23 - stable.  No changes made. Had f/u with Alexandra Mendoza and discussed upcoming surgery. Reviewed his recommendations  and he notes that from a cardiac standpoint, she has done well since her bypass surgery in 2015. She had a negative Myoview in 2024. She is able to walk 30 minutes daily without experiencing chest pain or dyspnea. She is capable of achieving 8 METS. ECG is unremarkable. No chest pain and no sob. No change in symptoms. Continue risk factor modification.

## 2024-07-27 NOTE — Assessment & Plan Note (Signed)
 Continue lipitor. Continue diet and exercise. Follow lipid panel.  Lab Results  Component Value Date   CHOL 114 05/30/2024   HDL 35.00 (L) 05/30/2024   LDLCALC 59 05/30/2024   TRIG 98.0 05/30/2024   CHOLHDL 3 05/30/2024

## 2024-08-11 ENCOUNTER — Ambulatory Visit: Admitting: Cardiovascular Disease

## 2024-08-15 ENCOUNTER — Encounter: Payer: Self-pay | Admitting: Internal Medicine

## 2024-08-16 NOTE — Telephone Encounter (Signed)
 Please call and thenk her for the update. I reviewed her blood pressure readings and her blood pressures are varying. I would like to schedule a soon f/u to check blood pressure in the office. Have her bring her cuff from home and we can see if it correlates with our cuff. See if she can come in Friday 08/22/24 11:00 - work in for this.

## 2024-08-18 NOTE — Telephone Encounter (Signed)
 Patient is agreeable to come in on Friday 08/22/24 at 11:00 to be a work in.

## 2024-08-18 NOTE — Telephone Encounter (Signed)
 See me about this. The 08/22/24 appt slot has already been taken. Now I have two people scheduled. Will need to move one pt.

## 2024-08-19 NOTE — Telephone Encounter (Signed)
 She is coming in 08/20/24 for visit with me.

## 2024-08-20 ENCOUNTER — Ambulatory Visit: Admitting: Internal Medicine

## 2024-08-20 ENCOUNTER — Encounter: Payer: Self-pay | Admitting: Internal Medicine

## 2024-08-20 VITALS — BP 142/64 | HR 63 | Temp 98.7°F | Ht 69.0 in | Wt 159.0 lb

## 2024-08-20 DIAGNOSIS — I10 Essential (primary) hypertension: Secondary | ICD-10-CM | POA: Diagnosis not present

## 2024-08-20 DIAGNOSIS — K2100 Gastro-esophageal reflux disease with esophagitis, without bleeding: Secondary | ICD-10-CM

## 2024-08-20 DIAGNOSIS — I2510 Atherosclerotic heart disease of native coronary artery without angina pectoris: Secondary | ICD-10-CM | POA: Diagnosis not present

## 2024-08-20 DIAGNOSIS — F419 Anxiety disorder, unspecified: Secondary | ICD-10-CM | POA: Diagnosis not present

## 2024-08-20 DIAGNOSIS — E7800 Pure hypercholesterolemia, unspecified: Secondary | ICD-10-CM | POA: Diagnosis not present

## 2024-08-20 DIAGNOSIS — G629 Polyneuropathy, unspecified: Secondary | ICD-10-CM

## 2024-08-20 DIAGNOSIS — E278 Other specified disorders of adrenal gland: Secondary | ICD-10-CM

## 2024-08-20 MED ORDER — PREGABALIN 75 MG PO CAPS: 75.0000 mg | ORAL_CAPSULE | Freq: Two times a day (BID) | ORAL | 2 refills | Status: AC

## 2024-08-20 MED ORDER — SPIRONOLACTONE 25 MG PO TABS: 25.0000 mg | ORAL_TABLET | Freq: Every day | ORAL | 2 refills | Status: AC

## 2024-08-20 NOTE — Progress Notes (Unsigned)
 Subjective:    [name], female    DOB: [birth year], 73 y.o.   MRN: [id number]  Patient here for  Chief Complaint  Patient presents with   Medical Management of Chronic Issues   Hypertension    HPI Here for a scheduled follow up -  follow up regarding CAD, GERD, hypercholesterolemia, neuropathy and hypertension. Is s/p lap cholecystectomy 06/12/24. Saw cardiology 04/22/24 - stable. Continue aspirin, statin, ARB and calcium  channel blocker. Was off spironolactone  and hydrochlorothiazide  due to lightheadedness and pressure 90s. With fluid retention, restarted spironolactone  and hydrochlorothiazide . 06/06/24 visit - was asked to hold hydrochlorothiazide . Last visit, recommended to continue amlodipine , coreg , avapro  and spironolactone . Had f/u with GI 07/23/24 - recovering well s/p cholecystectomy. Continuing on PPI with plans to taper dose. Started on folic acid . Has been monitoring her blood pressure - most averaging 130-150/60s. Previously had elevated calcium  level. Has been normal since being off hydrochlorothiazide .    Past Medical History:  Diagnosis Date   Acid reflux    Atrial fibrillation (post-op CABG 2015)    Cancer (HCC)    Colon polyp [K63.5]    Coronary artery disease    a. 2015 CABG x 3: LIMA->LAD, VG->RI->OM1; b. 10/2022 MV: EF 65%, no isch/infarct.   Heart murmur    History of chicken pox    Hyperlipidemia    Hypertension    Insomnia    Moderate mitral regurgitation    a. 10/2022 Echo: EF >55%, mod MR.   Past Surgical History:  Procedure Laterality Date   APPENDECTOMY     BREAST EXCISIONAL BIOPSY Right    negative over 5 years ago- neg   BREAST SURGERY     Biopsy   CHOLECYSTECTOMY N/A 06/12/2024   Procedure: LAPAROSCOPIC CHOLECYSTECTOMY WITH ICG DYE;  Surgeon: Tanda Locus, MD;  Location: [facility];  Service: General;  Laterality: N/A;   COLONOSCOPY WITH PROPOFOL  N/A 05/16/2016   Procedure: COLONOSCOPY WITH PROPOFOL ;  Surgeon: Rogelia Copping, MD;   Location: [facility];  Service: Endoscopy;  Laterality: N/A;   CORONARY ARTERY BYPASS GRAFT  05/01/2014   3 vessel, [facility]   ESOPHAGOGASTRODUODENOSCOPY (EGD) WITH PROPOFOL  N/A 02/23/2017   Procedure: ESOPHAGOGASTRODUODENOSCOPY (EGD) WITH PROPOFOL ;  Surgeon: Copping Rogelia, MD;  Location: [facility];  Service: Endoscopy;  Laterality: N/A;   HYSTEROSCOPY WITH D & C     lipoma removed     removed from forehead   skin lesions removed  01/2023   triple bypass     Family History  Problem Relation Age of Onset   Heart disease Mother    Stroke Mother    Alzheimer's disease Mother    Heart disease Father    Stroke Father    Breast cancer Sister 59   Alzheimer's disease Maternal Grandmother    Alzheimer's disease Maternal Aunt    Alzheimer's disease Maternal Uncle    Colon cancer Neg Hx    Liver disease Neg Hx    Esophageal cancer Neg Hx    Social History   Socioeconomic History   Marital status: Married    Spouse name: Not on file   Number of children: 1   Years of education: Not on file   Highest education level: Not on file  Occupational History   Not on file  Tobacco Use   Smoking status: Never   Smokeless tobacco: Never  Vaping Use   Vaping status: Never Used  Substance and Sexual Activity   Alcohol use: Never  Alcohol/week: 0.0 standard drinks of alcohol   Drug use: No   Sexual activity: Yes    Birth control/protection: Post-menopausal  Other Topics Concern   Not on file  Social History Narrative   Married   Social Drivers of Health   Financial Resource Strain: Low Risk  (10/08/2023)   Overall Financial Resource Strain (CARDIA)    Difficulty of Paying Living Expenses: Not hard at all  Food Insecurity: No Food Insecurity (10/08/2023)   Hunger Vital Sign    Worried About Running Out of Food in the Last Year: Never true    Ran Out of Food in the Last Year: Never true  Transportation Needs: No Transportation Needs (10/08/2023)   PRAPARE -  Administrator, Civil Service (Medical): No    Lack of Transportation (Non-Medical): No  Physical Activity: Insufficiently Active (10/08/2023)   Exercise Vital Sign    Days of Exercise per Week: 3 days    Minutes of Exercise per Session: 40 min  Stress: No Stress Concern Present (10/08/2023)   [employer] of Occupational Health - Occupational Stress Questionnaire    Feeling of Stress : Not at all  Social Connections: Moderately Integrated (10/08/2023)   Social Connection and Isolation Panel    Frequency of Communication with Friends and Family: More than three times a week    Frequency of Social Gatherings with Friends and Family: More than three times a week    Attends Religious Services: More than 4 times per year    Active Member of [employer] or Organizations: No    Attends Engineer, Structural: Never    Marital Status: Married     Review of Systems     Objective:     BP (!) 142/64   Pulse 63   Temp 98.7 F (37.1 C) (Oral)   Ht 5' 9 (1.753 m)   Wt 159 lb (72.1 kg)   SpO2 99%   BMI 23.48 kg/m  Wt Readings from Last 3 Encounters:  08/20/24 159 lb (72.1 kg)  07/23/24 158 lb 6 oz (71.8 kg)  07/21/24 156 lb 9.6 oz (71 kg)    Physical Exam  {Perform Simple Foot Exam  Perform Detailed exam:1} {Insert foot Exam (Optional):30965}   Outpatient Encounter Medications as of 08/20/2024  Medication Sig   ALPHA-LIPOIC ACID PO Take 600 mg by mouth in the morning.   amLODipine  (NORVASC ) 10 MG tablet Take 1 tablet (10 mg total) by mouth daily.   ascorbic acid (VITAMIN C) 1000 MG tablet Take 1 tablet by mouth in the morning.   aspirin EC 81 MG tablet Take 81 mg by mouth in the morning.   atorvastatin  (LIPITOR) 40 MG tablet Take 1 tablet (40 mg total) by mouth daily.   carvedilol  (COREG ) 6.25 MG tablet Take 1 tablet (6.25 mg total) by mouth 2 (two) times daily with a meal.   Cholecalciferol (VITAMIN D3) 1000 units CAPS Take 1,000 Units by mouth in the  morning.   cyanocobalamin  (VITAMIN B12) 1000 MCG tablet Take 1,000 mcg by mouth daily. (Patient taking differently: Take 500 mcg by mouth daily.)   docusate sodium (COLACE) 100 MG capsule Take 200 mg by mouth in the morning.   Famotidine  (PEPCID  AC PO) Take 1-2 tablets by mouth daily as needed (indigestion/heartburn.).   folic acid  (FOLVITE ) 1 MG tablet Take 1 tablet (1 mg total) by mouth daily.   irbesartan  (AVAPRO ) 300 MG tablet Take 1 tablet (300 mg total) by mouth every evening.  Multiple Minerals-Vitamins (CAL MAG ZINC +D3 PO) Take 1 tablet by mouth in the morning.   Omega-3 Fatty Acids (FISH OIL PO) Take 1,200 mg by mouth in the morning.   OVER THE COUNTER MEDICATION Take 1 Dose by mouth daily as needed (indigestion/heartburn). Reflux Gourmet Paste use 1 teaspoonful   pantoprazole  (PROTONIX ) 40 MG tablet Take 40 mg by mouth 2 (two) times daily.   polyethylene glycol (MIRALAX / GLYCOLAX) 17 g packet Take 17 g by mouth in the morning.   pregabalin  (LYRICA ) 75 MG capsule Take 1 capsule (75 mg total) by mouth 2 (two) times daily.   Probiotic Product (PROBIOTIC PO) Take 1 capsule by mouth in the morning.   spironolactone  (ALDACTONE ) 25 MG tablet Take 0.5 tablets (12.5 mg total) by mouth daily.   traZODone  (DESYREL ) 50 MG tablet Take 1-2 tablets (50-100 mg total) by mouth at bedtime as needed.   traMADol  (ULTRAM ) 50 MG tablet Take 1 tablet (50 mg total) by mouth every 6 (six) hours as needed for moderate pain (pain score 4-6) or severe pain (pain score 7-10).   No facility-administered encounter medications on file as of 08/20/2024.     Lab Results  Component Value Date   WBC 6.7 06/03/2024   HGB 12.4 06/03/2024   HCT 38.8 06/03/2024   PLT 183 06/03/2024   GLUCOSE 83 07/18/2024   CHOL 114 05/30/2024   TRIG 98.0 05/30/2024   HDL 35.00 (L) 05/30/2024   LDLCALC 59 05/30/2024   ALT 17 06/03/2024   AST 21 06/03/2024   NA 141 07/18/2024   K 4.6 07/18/2024   CL 105 07/18/2024    CREATININE 0.98 07/18/2024   BUN 15 07/18/2024   CO2 29 07/18/2024   TSH 3.34 06/11/2023    MM 3D SCREENING MAMMOGRAM BILATERAL BREAST Result Date: 07/18/2024 CLINICAL DATA:  Screening. EXAM: DIGITAL SCREENING BILATERAL MAMMOGRAM WITH TOMOSYNTHESIS AND CAD TECHNIQUE: Bilateral screening digital craniocaudal and mediolateral oblique mammograms were obtained. Bilateral screening digital breast tomosynthesis was performed. The images were evaluated with computer-aided detection. COMPARISON:  Previous exam(s). ACR Breast Density Category b: There are scattered areas of fibroglandular density. FINDINGS: There are no findings suspicious for malignancy. IMPRESSION: No mammographic evidence of malignancy. A result letter of this screening mammogram will be mailed directly to the patient. RECOMMENDATION: Screening mammogram in one year. (Code:SM-B-01Y) BI-RADS CATEGORY  1: Negative. Electronically Signed   By: Alm Parkins M.D.   On: 07/18/2024 13:42       Assessment & Plan:  There are no diagnoses linked to this encounter.   Allena Hamilton, MD

## 2024-08-20 NOTE — Patient Instructions (Signed)
Increase spironolactone to 25 mg per day

## 2024-08-22 ENCOUNTER — Ambulatory Visit: Admitting: Internal Medicine

## 2024-08-30 ENCOUNTER — Encounter: Payer: Self-pay | Admitting: Internal Medicine

## 2024-08-30 NOTE — Assessment & Plan Note (Signed)
 Continue trazodone  at night. Discussed increased stress/worrying. Follow. Hold on additional medication. Follow

## 2024-08-30 NOTE — Assessment & Plan Note (Signed)
 NCS revealed polyneuropathy.  Continue lyrica  at current dose.  Stable.

## 2024-08-30 NOTE — Assessment & Plan Note (Signed)
Has been evaluated by urology previously.  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  No further w/up warranted.   

## 2024-08-30 NOTE — Assessment & Plan Note (Signed)
 Now taking protonix  20mg  in am and 40mg  q pm. Symptoms controlled. Follow. Will plan to continue to taper as tolerated.

## 2024-08-30 NOTE — Assessment & Plan Note (Signed)
 Saw Dr Florencio 10/23/22 - recommended myoview and echo.  Continue aspirin and atorvastatin.  Myoview overall showed good exercise tolerance walking 7 minutes under the Bruce protocol with no significant symptom development. No evidence of myocardial ischemia by Myoview. Echocardiogram had showed overall normal LV and RV systolic function, EF greater than 55% with moderate TR overall unchanged from 2022  saw Dr Perla 01/29/23 - stable.  No changes made. Had f/u with Medford Meager and discussed upcoming surgery. Reviewed his recommendations  and he notes that from a cardiac standpoint, she has done well since her bypass surgery in 2015. She had a negative Myoview in 2024. She is able to walk 30 minutes daily without experiencing chest pain or dyspnea. She is capable of achieving 8 METS. ECG is unremarkable. No chest pain and no sob. No change in symptoms. Continue risk factor modification.

## 2024-08-30 NOTE — Assessment & Plan Note (Signed)
 Continue lipitor. Continue diet and exercise. Follow lipid panel.  Lab Results  Component Value Date   CHOL 114 05/30/2024   HDL 35.00 (L) 05/30/2024   LDLCALC 59 05/30/2024   TRIG 98.0 05/30/2024   CHOLHDL 3 05/30/2024

## 2024-08-30 NOTE — Assessment & Plan Note (Signed)
 Blood pressure elevated as outlined. Contiue amlodipine , coreg , avapro  and spironolactone  - increase one tablet per day. Follow pressures. Follow metabolic panel.

## 2024-09-08 ENCOUNTER — Other Ambulatory Visit

## 2024-09-08 ENCOUNTER — Other Ambulatory Visit: Payer: Self-pay

## 2024-09-08 DIAGNOSIS — I10 Essential (primary) hypertension: Secondary | ICD-10-CM

## 2024-09-08 DIAGNOSIS — E7800 Pure hypercholesterolemia, unspecified: Secondary | ICD-10-CM | POA: Diagnosis not present

## 2024-09-08 LAB — BASIC METABOLIC PANEL WITH GFR
BUN: 18 mg/dL (ref 6–23)
CO2: 29 meq/L (ref 19–32)
Calcium: 10 mg/dL (ref 8.4–10.5)
Chloride: 101 meq/L (ref 96–112)
Creatinine, Ser: 0.94 mg/dL (ref 0.40–1.20)
GFR: 60.29 mL/min
Glucose, Bld: 82 mg/dL (ref 70–99)
Potassium: 5.1 meq/L (ref 3.5–5.1)
Sodium: 137 meq/L (ref 135–145)

## 2024-09-08 NOTE — Addendum Note (Signed)
 Addended by: BRIEN SHARENE RAMAN on: 09/08/2024 03:15 PM   Modules accepted: Orders

## 2024-09-08 NOTE — Addendum Note (Signed)
 Addended by: BRIEN SHARENE RAMAN on: 09/08/2024 03:13 PM   Modules accepted: Orders

## 2024-09-09 ENCOUNTER — Ambulatory Visit: Payer: Self-pay | Admitting: Internal Medicine

## 2024-09-09 ENCOUNTER — Other Ambulatory Visit: Payer: Self-pay | Admitting: Internal Medicine

## 2024-09-09 DIAGNOSIS — E875 Hyperkalemia: Secondary | ICD-10-CM

## 2024-09-09 NOTE — Progress Notes (Signed)
Order placed for f/u potassium check.  

## 2024-09-16 ENCOUNTER — Other Ambulatory Visit
Admission: RE | Admit: 2024-09-16 | Discharge: 2024-09-16 | Disposition: A | Discharge status: Home / Self Care | Attending: Internal Medicine | Admitting: Internal Medicine

## 2024-09-16 DIAGNOSIS — E875 Hyperkalemia: Secondary | ICD-10-CM | POA: Insufficient documentation

## 2024-09-16 DIAGNOSIS — E7800 Pure hypercholesterolemia, unspecified: Secondary | ICD-10-CM | POA: Insufficient documentation

## 2024-09-16 LAB — HEPATIC FUNCTION PANEL
ALT: 17 U/L (ref 0–44)
AST: 20 U/L (ref 15–41)
Albumin: 4.4 g/dL (ref 3.5–5.0)
Alkaline Phosphatase: 59 U/L (ref 38–126)
Bilirubin, Direct: 0.2 mg/dL (ref 0.0–0.2)
Indirect Bilirubin: 0.2 mg/dL — ABNORMAL LOW (ref 0.3–0.9)
Total Bilirubin: 0.4 mg/dL (ref 0.0–1.2)
Total Protein: 6.5 g/dL (ref 6.5–8.1)

## 2024-09-16 LAB — LIPID PANEL
Cholesterol: 95 mg/dL (ref 0–200)
HDL: 40 mg/dL — ABNORMAL LOW
LDL Cholesterol: 36 mg/dL (ref 0–99)
Total CHOL/HDL Ratio: 2.4 ratio
Triglycerides: 92 mg/dL
VLDL: 18 mg/dL (ref 0–40)

## 2024-09-16 LAB — POTASSIUM: Potassium: 4.7 mmol/L (ref 3.5–5.1)

## 2024-09-16 LAB — TSH: TSH: 4.11 u[IU]/mL (ref 0.350–4.500)

## 2024-09-17 ENCOUNTER — Ambulatory Visit: Payer: Self-pay | Admitting: Internal Medicine

## 2024-10-07 ENCOUNTER — Other Ambulatory Visit

## 2024-10-08 ENCOUNTER — Telehealth: Payer: Self-pay | Admitting: Internal Medicine

## 2024-10-08 NOTE — Telephone Encounter (Signed)
 I received notification that Alexandra Mendoza needed labs ordered for tomorrow. She is scheduled for a fasting lab appt tomorrow (Thursday 10/09/24). She just had labs 09/17/23. Does not need to come in tomorrow for a lab appt. I called and left her a message. I also sent her a my chart message. Please call and see if you can reach her prior to her scheduled appt time.  Thanks.

## 2024-10-09 ENCOUNTER — Other Ambulatory Visit

## 2024-10-09 ENCOUNTER — Ambulatory Visit: Admitting: Internal Medicine

## 2024-10-10 ENCOUNTER — Ambulatory Visit: Payer: PPO | Admitting: *Deleted

## 2024-10-10 VITALS — BP 138/68 | Ht 69.0 in | Wt 152.0 lb

## 2024-10-10 DIAGNOSIS — Z0000 Encounter for general adult medical examination without abnormal findings: Secondary | ICD-10-CM

## 2024-10-13 ENCOUNTER — Ambulatory Visit: Admitting: Internal Medicine

## 2024-10-20 ENCOUNTER — Ambulatory Visit: Admitting: Cardiovascular Disease

## 2024-11-03 ENCOUNTER — Ambulatory Visit: Admitting: Internal Medicine

## 2025-10-14 ENCOUNTER — Ambulatory Visit
